# Patient Record
Sex: Female | Born: 1950
Health system: Southern US, Community
[De-identification: ages and names within clinical notes are randomized; demographics above are authoritative.]

## PROBLEM LIST (undated history)

## (undated) DIAGNOSIS — Z5189 Encounter for other specified aftercare: Secondary | ICD-10-CM

## (undated) DIAGNOSIS — I10 Essential (primary) hypertension: Secondary | ICD-10-CM

## (undated) DIAGNOSIS — H269 Unspecified cataract: Secondary | ICD-10-CM

## (undated) DIAGNOSIS — E119 Type 2 diabetes mellitus without complications: Secondary | ICD-10-CM

## (undated) DIAGNOSIS — H547 Unspecified visual loss: Secondary | ICD-10-CM

## (undated) DIAGNOSIS — M1990 Unspecified osteoarthritis, unspecified site: Secondary | ICD-10-CM

## (undated) DIAGNOSIS — T7840XA Allergy, unspecified, initial encounter: Secondary | ICD-10-CM

## (undated) DIAGNOSIS — E063 Autoimmune thyroiditis: Secondary | ICD-10-CM

## (undated) DIAGNOSIS — E039 Hypothyroidism, unspecified: Secondary | ICD-10-CM

## (undated) DIAGNOSIS — N186 End stage renal disease: Secondary | ICD-10-CM

## (undated) DIAGNOSIS — E11319 Type 2 diabetes mellitus with unspecified diabetic retinopathy without macular edema: Secondary | ICD-10-CM

## (undated) DIAGNOSIS — E785 Hyperlipidemia, unspecified: Secondary | ICD-10-CM

## (undated) HISTORY — DX: Allergy, unspecified, initial encounter: T78.40XA

## (undated) HISTORY — DX: Encounter for other specified aftercare: Z51.89

## (undated) HISTORY — DX: Essential (primary) hypertension: I10

## (undated) HISTORY — DX: Type 2 diabetes mellitus without complications: E11.9

## (undated) HISTORY — PX: EYE SURGERY: SHX253

## (undated) HISTORY — DX: Type 2 diabetes mellitus with unspecified diabetic retinopathy without macular edema: E11.319

## (undated) HISTORY — DX: Unspecified visual loss: H54.7

## (undated) HISTORY — DX: End stage renal disease: N18.6

## (undated) HISTORY — PX: ABDOMINAL HYSTERECTOMY: SHX81

## (undated) HISTORY — DX: Autoimmune thyroiditis: E06.3

## (undated) HISTORY — PX: KNEE SURGERY: SHX244

## (undated) HISTORY — DX: Hypothyroidism, unspecified: E03.9

## (undated) HISTORY — DX: Unspecified cataract: H26.9

## (undated) HISTORY — PX: COLONOSCOPY: SHX174

## (undated) HISTORY — DX: Hyperlipidemia, unspecified: E78.5

---

## 1997-11-17 ENCOUNTER — Ambulatory Visit (HOSPITAL_COMMUNITY): Admission: RE | Admit: 1997-11-17 | Discharge: 1997-11-17 | Payer: Self-pay | Admitting: *Deleted

## 1998-06-22 ENCOUNTER — Other Ambulatory Visit: Admission: RE | Admit: 1998-06-22 | Discharge: 1998-06-22 | Payer: Self-pay | Admitting: *Deleted

## 1999-11-24 ENCOUNTER — Ambulatory Visit (HOSPITAL_COMMUNITY): Admission: RE | Admit: 1999-11-24 | Discharge: 1999-11-24 | Payer: Self-pay | Admitting: *Deleted

## 2000-06-12 ENCOUNTER — Encounter: Admission: RE | Admit: 2000-06-12 | Discharge: 2000-09-10 | Payer: Self-pay | Admitting: Endocrinology

## 2003-09-16 ENCOUNTER — Encounter: Admission: RE | Admit: 2003-09-16 | Discharge: 2003-09-16 | Payer: Self-pay | Admitting: Endocrinology

## 2004-04-21 ENCOUNTER — Ambulatory Visit: Payer: Self-pay | Admitting: Endocrinology

## 2004-04-27 ENCOUNTER — Ambulatory Visit: Payer: Self-pay | Admitting: Endocrinology

## 2004-05-11 ENCOUNTER — Ambulatory Visit: Payer: Self-pay | Admitting: Internal Medicine

## 2004-05-27 ENCOUNTER — Ambulatory Visit: Payer: Self-pay | Admitting: Endocrinology

## 2004-07-04 ENCOUNTER — Ambulatory Visit: Payer: Self-pay | Admitting: Endocrinology

## 2004-07-18 ENCOUNTER — Encounter: Admission: RE | Admit: 2004-07-18 | Discharge: 2004-07-18 | Payer: Self-pay | Admitting: Endocrinology

## 2004-08-30 ENCOUNTER — Ambulatory Visit: Payer: Self-pay | Admitting: Endocrinology

## 2004-09-08 ENCOUNTER — Ambulatory Visit: Payer: Self-pay | Admitting: Endocrinology

## 2004-09-13 ENCOUNTER — Ambulatory Visit (HOSPITAL_COMMUNITY): Admission: RE | Admit: 2004-09-13 | Discharge: 2004-09-13 | Payer: Self-pay | Admitting: Endocrinology

## 2004-09-15 ENCOUNTER — Ambulatory Visit: Payer: Self-pay

## 2004-09-22 ENCOUNTER — Ambulatory Visit: Payer: Self-pay | Admitting: Endocrinology

## 2004-10-04 ENCOUNTER — Ambulatory Visit: Payer: Self-pay | Admitting: Endocrinology

## 2004-10-18 ENCOUNTER — Ambulatory Visit: Payer: Self-pay | Admitting: Endocrinology

## 2004-11-14 ENCOUNTER — Ambulatory Visit: Payer: Self-pay | Admitting: Endocrinology

## 2005-01-09 ENCOUNTER — Ambulatory Visit: Payer: Self-pay | Admitting: Endocrinology

## 2005-05-02 ENCOUNTER — Ambulatory Visit: Payer: Self-pay | Admitting: Endocrinology

## 2005-05-23 ENCOUNTER — Ambulatory Visit: Payer: Self-pay | Admitting: Endocrinology

## 2005-10-19 ENCOUNTER — Ambulatory Visit: Payer: Self-pay | Admitting: Endocrinology

## 2005-11-24 ENCOUNTER — Ambulatory Visit: Payer: Self-pay | Admitting: Endocrinology

## 2005-11-29 ENCOUNTER — Ambulatory Visit: Payer: Self-pay | Admitting: Endocrinology

## 2005-12-13 ENCOUNTER — Ambulatory Visit: Payer: Self-pay | Admitting: Internal Medicine

## 2006-01-30 ENCOUNTER — Ambulatory Visit: Payer: Self-pay | Admitting: Gastroenterology

## 2006-02-20 ENCOUNTER — Ambulatory Visit: Payer: Self-pay | Admitting: Endocrinology

## 2006-03-09 ENCOUNTER — Ambulatory Visit: Payer: Self-pay | Admitting: Gastroenterology

## 2006-03-26 ENCOUNTER — Ambulatory Visit: Payer: Self-pay | Admitting: Endocrinology

## 2006-03-27 ENCOUNTER — Ambulatory Visit (HOSPITAL_BASED_OUTPATIENT_CLINIC_OR_DEPARTMENT_OTHER): Admission: RE | Admit: 2006-03-27 | Discharge: 2006-03-27 | Payer: Self-pay | Admitting: Orthopedic Surgery

## 2006-08-22 ENCOUNTER — Encounter: Admission: RE | Admit: 2006-08-22 | Discharge: 2006-08-22 | Payer: Self-pay | Admitting: Endocrinology

## 2006-11-22 ENCOUNTER — Encounter: Payer: Self-pay | Admitting: Endocrinology

## 2006-11-22 DIAGNOSIS — E039 Hypothyroidism, unspecified: Secondary | ICD-10-CM | POA: Insufficient documentation

## 2006-11-22 DIAGNOSIS — J309 Allergic rhinitis, unspecified: Secondary | ICD-10-CM | POA: Insufficient documentation

## 2006-11-22 DIAGNOSIS — I10 Essential (primary) hypertension: Secondary | ICD-10-CM | POA: Insufficient documentation

## 2007-03-14 ENCOUNTER — Telehealth: Payer: Self-pay | Admitting: Endocrinology

## 2007-04-14 ENCOUNTER — Emergency Department (HOSPITAL_COMMUNITY): Admission: EM | Admit: 2007-04-14 | Discharge: 2007-04-14 | Payer: Self-pay | Admitting: Emergency Medicine

## 2007-04-23 ENCOUNTER — Ambulatory Visit: Payer: Self-pay | Admitting: Endocrinology

## 2007-05-14 ENCOUNTER — Ambulatory Visit: Payer: Self-pay | Admitting: Endocrinology

## 2007-06-25 ENCOUNTER — Ambulatory Visit: Payer: Self-pay | Admitting: Endocrinology

## 2007-06-26 ENCOUNTER — Telehealth (INDEPENDENT_AMBULATORY_CARE_PROVIDER_SITE_OTHER): Payer: Self-pay | Admitting: *Deleted

## 2007-06-26 LAB — CONVERTED CEMR LAB
Albumin: 3.2 g/dL — ABNORMAL LOW (ref 3.5–5.2)
Amylase: 29 units/L (ref 27–131)
BUN: 18 mg/dL (ref 6–23)
Basophils Absolute: 0 10*3/uL (ref 0.0–0.1)
Creatinine, Ser: 1 mg/dL (ref 0.4–1.2)
Eosinophils Absolute: 0 10*3/uL (ref 0.0–0.6)
GFR calc Af Amer: 74 mL/min
GFR calc non Af Amer: 61 mL/min
HCT: 35.5 % — ABNORMAL LOW (ref 36.0–46.0)
Hemoglobin: 12 g/dL (ref 12.0–15.0)
Lymphocytes Relative: 10.7 % — ABNORMAL LOW (ref 12.0–46.0)
MCHC: 33.9 g/dL (ref 30.0–36.0)
MCV: 88.3 fL (ref 78.0–100.0)
Monocytes Absolute: 0.8 10*3/uL — ABNORMAL HIGH (ref 0.2–0.7)
Monocytes Relative: 5.8 % (ref 3.0–11.0)
Neutro Abs: 11.8 10*3/uL — ABNORMAL HIGH (ref 1.4–7.7)
Neutrophils Relative %: 83.1 % — ABNORMAL HIGH (ref 43.0–77.0)
Potassium: 4.2 meq/L (ref 3.5–5.1)
Sodium: 140 meq/L (ref 135–145)
Total Bilirubin: 0.7 mg/dL (ref 0.3–1.2)

## 2007-08-30 ENCOUNTER — Encounter: Admission: RE | Admit: 2007-08-30 | Discharge: 2007-08-30 | Payer: Self-pay | Admitting: Family Medicine

## 2010-05-22 ENCOUNTER — Encounter: Payer: Self-pay | Admitting: Endocrinology

## 2010-07-14 DIAGNOSIS — H269 Unspecified cataract: Secondary | ICD-10-CM | POA: Insufficient documentation

## 2010-09-16 NOTE — Assessment & Plan Note (Signed)
[place] OFFICE NOTE   NATALLY, HELLENBRAND                         MRN:          [id number]  DATE:01/30/2006                            DOB:          Mar 23, 1951    REASON FOR CONSULTATION:  Colorectal cancer screening.   Ms. Denise Bush is a 60 year old African-American female referred through the  courtesy of Dr. Loanne Drilling for colonoscopy. Except for occasional constipation  she has no GI complaints. Specifically, she is without change in bowel  habits, abdominal pain, melena or hematochezia.   PAST MEDICAL HISTORY:  1. Pertinent for hypertension.  2. Diabetes.  3. She has a history of thyroid disease.  4. She is status post hysterectomy.   FAMILY HISTORY:  Family history is pertinent for a sister with breast cancer  and a sister and mother with diabetes.   MEDICATIONS:  Insulin, Synthroid and Hyzaar.   ALLERGIES:  GLUCOPHAGE, DIAZIDE, NORMODYNE.   SOCIAL HISTORY:  She neither smokes nor drinks. 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   REVIEW OF SYSTEMS:  Positive for joint pains and night sweats.   PHYSICAL EXAMINATION:  VITAL SIGNS:  Pulse 70, blood pressure 144/62. Weight  194.  HEENT: EOMI. PERRLA. Sclerae are anicteric.  Conjunctivae are pink.  NECK:  Supple without thyromegaly, adenopathy or carotid bruits.  CHEST:  Clear to auscultation and percussion without adventitious sounds.  CARDIAC:  Regular rhythm; normal S1 S2.  There are no murmurs, gallops or  rubs.  ABDOMEN:  Bowel sounds are normoactive.  Abdomen is soft, non-tender and non-  distended.  There are no abdominal masses, tenderness, splenic enlargement  or hepatomegaly.  EXTREMITIES:  Full range of motion.  No cyanosis, clubbing or edema.  RECTAL:  Deferred.   ASSESSMENT AND PLAN:  The patient's medical problems are stable including  diabetes and hypertension.   PLAN:  Colonoscopy.       Sandy Salaam. Deatra Ina, MD,FACG      RDK/MedQ  DD:  01/30/2006  DT:  01/31/2006  Job #:  [id number]   cc:   Hilliard Clark A. Loanne Drilling, MD

## 2010-09-16 NOTE — Op Note (Signed)
Denise Bush, Denise Bush                ACCOUNT NO.:  [id number]   MEDICAL RECORD NO.:  [id number]          PATIENT TYPE:  AMB   LOCATION:  [facility]                         FACILITY:  [facility]   PHYSICIAN:  Kipp Brood. Gioffre, M.D.DATE OF BIRTH:  1950-09-04   DATE OF PROCEDURE:  03/27/2006  DATE OF DISCHARGE:                                 OPERATIVE REPORT   SURGEON:  Kipp Brood. Gladstone Lighter, M.D.   ASSISTANT:  Nurse.   PREOPERATIVE DIAGNOSIS:  1. Degenerative arthritis, right knee.  2. Complete tear of the lateral meniscus, right knee.   POSTOPERATIVE DIAGNOSIS:  1. Degenerative arthritis, right knee.  2. Complete tear of the lateral meniscus, right knee.   OPERATION:  1. Diagnostic arthroscopy, right knee.  2. Synovectomy suprapatellar pouch, right knee.  3. Synovectomy medial joint space, right knee.  4. Abrasion chondroplasty medial femoral condyle, right knee.  5. Lateral meniscectomy, right knee.   PROCEDURE:  Under general anesthesia, routine orthopedic prep and draping of  the right lower extremity was carried out.  The patient had 1 gram of IV  Ancef.  A small punctate incision was made in the suprapatellar pouch,  inflow cannula was inserted, and the knee was distended with saline.  Another small punctate incision was made in the anterolateral joint, the  arthroscope was entered from the lateral approach and a complete diagnostic  arthroscopy was carried out.  She had rather extensive synovitis in the  suprapatellar pouch. I introduced a shaver suction device from the medial  approach and did a synovectomy of the suprapatellar pouch.  She had minimal  chondromalacia of her patella.  The ACL was intact.  In the medial joint,  she had a large carved out of the medial femoral condyle.  I did an abrasion  chondroplasty of this area.  The medial meniscus was probed and it was  intact.  She had marked synovitis of the medial joint and I did a  synovectomy.  The cruciates were intact.   The lateral joint was her main  issue, she had severe degenerative arthritic changes in the lateral joint  with a severe tear of the lateral meniscus.  I did a lateral meniscectomy  with the shaver suction device and also did a synovectomy in the area and  cleaned the knee out.  The wound was thoroughly irrigated and the fluid was  removed. All three punctate incisions were closed with 3-0 nylon suture.  I  injected 20 mL of 0.5% Marcaine with epinephrine into the knee joint.  Sterile Neosporin and dressing was applied.  The patient left the operating  room in satisfactory position.   1. Postop, she will be on aspirin twice a day today and twice a day for      two weeks as a blood thinner.  2. She will be on Percocet 10/650 for pain.  3. She is on crutches, partial to full weight-bearing as tolerated.  4. She will be seen in the office in 10-12 days or prior to if she has      problems.  ______________________________  Kipp Brood Gladstone Lighter, M.D.     RAG/MEDQ  D:  03/27/2006  T:  03/27/2006  Job:  [id number]

## 2010-10-24 ENCOUNTER — Ambulatory Visit (HOSPITAL_COMMUNITY)
Admission: RE | Admit: 2010-10-24 | Discharge: 2010-10-24 | Disposition: A | Discharge status: Home / Self Care | Source: Ambulatory Visit | Attending: Ophthalmology | Admitting: Ophthalmology

## 2010-10-24 ENCOUNTER — Ambulatory Visit (HOSPITAL_COMMUNITY)

## 2010-10-24 DIAGNOSIS — Z01818 Encounter for other preprocedural examination: Secondary | ICD-10-CM | POA: Insufficient documentation

## 2010-10-24 DIAGNOSIS — Z01812 Encounter for preprocedural laboratory examination: Secondary | ICD-10-CM | POA: Insufficient documentation

## 2010-10-24 DIAGNOSIS — E11359 Type 2 diabetes mellitus with proliferative diabetic retinopathy without macular edema: Secondary | ICD-10-CM | POA: Insufficient documentation

## 2010-10-24 DIAGNOSIS — H3340 Traction detachment of retina, unspecified eye: Secondary | ICD-10-CM | POA: Insufficient documentation

## 2010-10-24 DIAGNOSIS — I10 Essential (primary) hypertension: Secondary | ICD-10-CM | POA: Insufficient documentation

## 2010-10-24 DIAGNOSIS — E1139 Type 2 diabetes mellitus with other diabetic ophthalmic complication: Secondary | ICD-10-CM | POA: Insufficient documentation

## 2010-10-24 LAB — BASIC METABOLIC PANEL
Chloride: 105 mEq/L (ref 96–112)
GFR calc Af Amer: 60 mL/min — ABNORMAL LOW (ref >60)
GFR calc non Af Amer: 49 mL/min — ABNORMAL LOW (ref >60)
Glucose, Bld: 202 mg/dL — ABNORMAL HIGH (ref 70–99)
Potassium: 4.6 mEq/L (ref 3.5–5.1)
Sodium: 141 mEq/L (ref 135–145)

## 2010-10-24 LAB — CBC
Hemoglobin: 11.4 g/dL — ABNORMAL LOW (ref 12.0–15.0)
MCHC: 33.4 g/dL (ref 30.0–36.0)
RDW: 15.4 % (ref 11.5–15.5)
WBC: 8.1 10*3/uL (ref 4.0–10.5)

## 2010-10-24 LAB — GLUCOSE, CAPILLARY
Glucose-Capillary: 186 mg/dL — ABNORMAL HIGH (ref 70–99)
Glucose-Capillary: 137 mg/dL — ABNORMAL HIGH (ref 70–99)

## 2010-10-24 LAB — SURGICAL PCR SCREEN: Staphylococcus aureus: NEGATIVE

## 2010-12-07 NOTE — Op Note (Signed)
NAMEZIZA, SALZMANN                ACCOUNT NO.:  [id number]  MEDICAL RECORD NO.:  [id number]  LOCATION:  [facility]                         FACILITY:  [place]  PHYSICIAN:  Dominica Severin A. Halley Kincer, M.D.   DATE OF BIRTH:  1951/03/03  DATE OF PROCEDURE:  10/24/2010 DATE OF DISCHARGE:  10/24/2010                              OPERATIVE REPORT   PREOPERATIVE DIAGNOSES: 1. 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 2. Proliferative vitreoretinopathy, left eye.  POSTOPERATIVE DIAGNOSES: 1. 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 2. Proliferative vitreoretinopathy, left eye.  PROCEDURE: 1. Posterior vitrectomy with membrane peel - repair of complex retinal     detachment via endolaser photocoagulation, removal of retained     silicone oil implant - nonmagnetic foreign body, left eye. 2. Injection of vitreous substitute - silicone oil [id number] centistokes.  SURGEON:  Clent Demark. Andriana Casa, MD  ANESTHESIA:  General endotracheal anesthesia.  INDICATION FOR PROCEDURE:  The patient is a 60 year old woman who has profound vision loss in the left eye on the basis of combined traction rhegmatogenous detachment, left eye, proliferative vitreoretinopathy, proliferative diabetic retinopathy, funnel open, funnel detachment, stage D1 with what might be subretinal migration of silicone oil.  The patient understands this is an attempt to reattach the retina to remove the epiretinal tissues contributing to the cicatricial changes into to reestablish and potentially regain some ambulatory vision.  The patient understands the risks of anesthesia  including rate occurrence of death, loss of the eye including, but not limited to hemorrhage, infection, scarring, need for another surgery, no change in vision, loss of vision, and progressive disease despite intervention.  PROCEDURE IN DETAIL:  After appropriate signed consent was obtained, the patient was taken to the operating room.  In the operating room, appropriate monitors followed by mild sedation.  Appropriate site selection was confirmed,  general endotracheal anesthesia was induced without difficulty.  The left periocular region was prepped and draped in usual ophthalmic fashion.  Lid speculum was applied.  A 25-gauge trocar placed in the inferotemporal quadrant.  Infusion was secured and turned on in the vitreous cavity.  Superior trocar applied superotemporally.  Cutdown conjunctival previous superonasal quadrant with an MVR blade placed into the vitreous cavity to allow for oil removal.  A 25-gauge instruments were used with an 18-gauge Silastic cannula to extract so as the silicone oil from the vitreous cavity is being confirmatory on subretinal migration of the large inferonasal retinotomy and the oil was removed from this area also with a larger 18- gauge Silastic cannula.  Fluid exchange completed and further migration of oil erupted from beneath the retina.  Significant contracture was noted.  At this time under, an air-fluid exchange completed under fluid, and dense epiretinal proliferative vitreoretinopathy and membranes removed off the optic nerve and  macular region as well as peripherally. This did not mobilize retina somewhat.  There is need for retinotomy superior 2 or 3 o'clock hours for some foreshortening of the retina. This was carried out without difficulty.  Fluid-air exchange completed, and all oil had been removed from the subretinal space.  Under air, Endolaser photocoagulation carried on panphotocoagulation basis.  Retina flattened nicely with  posterior pole.  Periphery from the 1 o'clock position down around to the 7 o'clock position were attached. Retinotomy was necessary to extend from the 7:30 position to the 11:30 position.  The retina flattened nicely.  The instruments were removed from the eye, and air-silicone oil exchange carried out passively under direct observation.  Supratemporal sclerotomy closed with Vicryl suture. Similarly, superonasal was closed with adequate oil fill was obtained. Then the infusion was then removed and closed with 7-0 Vicryl suture. Subconjunctival steroid applied.  Sterile patch and Fox shield applied. The patient was awaken from anesthesia and taken to the PACU in good stable condition.     Clent Demark Dorthy Magnussen, M.D.     GAR/MEDQ  D:  10/24/2010  T:  10/25/2010  Job:  [id number]  Electronically Signed by Deloria Lair M.D. on 12/07/2010 02:46:58 PM

## 2012-05-06 DIAGNOSIS — E113599 Type 2 diabetes mellitus with proliferative diabetic retinopathy without macular edema, unspecified eye: Secondary | ICD-10-CM | POA: Insufficient documentation

## 2013-01-29 HISTORY — PX: EYE SURGERY: SHX253

## 2013-02-18 ENCOUNTER — Ambulatory Visit (INDEPENDENT_AMBULATORY_CARE_PROVIDER_SITE_OTHER)

## 2013-02-18 VITALS — BP 168/78 | HR 64 | Temp 96.7°F | Resp 12 | Ht 65.0 in | Wt 180.0 lb

## 2013-02-18 DIAGNOSIS — E1040 Type 1 diabetes mellitus with diabetic neuropathy, unspecified: Secondary | ICD-10-CM

## 2013-02-18 DIAGNOSIS — M79609 Pain in unspecified limb: Secondary | ICD-10-CM

## 2013-02-18 DIAGNOSIS — E1049 Type 1 diabetes mellitus with other diabetic neurological complication: Secondary | ICD-10-CM

## 2013-02-18 DIAGNOSIS — E1142 Type 2 diabetes mellitus with diabetic polyneuropathy: Secondary | ICD-10-CM

## 2013-02-18 DIAGNOSIS — B351 Tinea unguium: Secondary | ICD-10-CM

## 2013-02-18 NOTE — Patient Instructions (Signed)

## 2013-02-18 NOTE — Progress Notes (Signed)
  Subjective:    Patient ID: Denise Bush, female    DOB: 1950/11/09, 62 y.o.   MRN: [id number]  HPI Comments: '' TRIM MY TOENAILS''  patient has long-standing history of thick discolored and darkened brittle nails 1 through 5 bilateral. They're tender on palpation and debridement. Patient also some dry skin. Does have a long-standing history of diabetes which is well controlled this time.    Review of Systems  Constitutional: Negative.   HENT: Negative.   Eyes: Positive for visual disturbance.  Respiratory: Negative.   Cardiovascular: Negative.   Gastrointestinal: Negative.   Endocrine: Negative.   Genitourinary: Positive for frequency.  Musculoskeletal: Negative.   Skin: Negative.   Allergic/Immunologic: Negative.   Neurological: Positive for numbness.  Hematological: Negative.   Psychiatric/Behavioral: Negative.        Objective:   Physical Exam  Constitutional: She is oriented to person, place, and time. She appears well-developed and well-nourished.  Cardiovascular:  Pulses:      Dorsalis pedis pulses are 2+ on the right side, and 2+ on the left side.       Posterior tibial pulses are 1+ on the right side, and 1+ on the left side.  Capillary refill timed 3-4 seconds all digits. Skin temperature warm. No edema. No varicosities to  Musculoskeletal:  Rectus foot type bilateral mild flexible digital contractures noted.  Neurological: She is alert and oriented to person, place, and time. She has normal strength and normal reflexes.  Epicritic and proprioceptive sensations are intact although diminished on Semmes Weinstein testing to the plantar forefoot and inferior heel bilateral sensation to toes ankles and dorsal foot intact by 1 DTRs intact and symmetric normal plantar response  Skin: Skin is warm. No cyanosis. Nails show no clubbing.  Skin color and pigment normal hair growth absent bilateral. Nails criptotic with discoloration and brittleness and tenderness on palpation.  There is distal pterygium noted on multiple nails in particular fourth left  Psychiatric: She has a normal mood and affect. Her behavior is normal.          Assessment & Plan:  Diabetes with peripheral neuropathy. Onychomycosis nails with brittleness discoloration and incurvation tender painful nails debrided x10 the presence of diabetes cutting factors and onychomycosis return for followup in 3 months or as needed in the future.  Denise Bush DPM

## 2013-03-11 ENCOUNTER — Ambulatory Visit: Payer: Self-pay

## 2013-05-01 HISTORY — PX: BREAST BIOPSY: SHX20

## 2013-05-20 ENCOUNTER — Ambulatory Visit

## 2013-07-15 ENCOUNTER — Other Ambulatory Visit: Payer: Self-pay | Admitting: *Deleted

## 2013-07-15 DIAGNOSIS — N6310 Unspecified lump in the right breast, unspecified quadrant: Secondary | ICD-10-CM

## 2013-07-25 ENCOUNTER — Other Ambulatory Visit

## 2013-07-25 ENCOUNTER — Encounter

## 2013-08-26 ENCOUNTER — Ambulatory Visit (INDEPENDENT_AMBULATORY_CARE_PROVIDER_SITE_OTHER)

## 2013-08-26 VITALS — BP 178/84 | HR 68 | Resp 16 | Ht 65.0 in | Wt 185.0 lb

## 2013-08-26 DIAGNOSIS — E1049 Type 1 diabetes mellitus with other diabetic neurological complication: Secondary | ICD-10-CM

## 2013-08-26 DIAGNOSIS — E1040 Type 1 diabetes mellitus with diabetic neuropathy, unspecified: Secondary | ICD-10-CM

## 2013-08-26 DIAGNOSIS — B351 Tinea unguium: Secondary | ICD-10-CM

## 2013-08-26 DIAGNOSIS — M79609 Pain in unspecified limb: Secondary | ICD-10-CM

## 2013-08-26 DIAGNOSIS — E1142 Type 2 diabetes mellitus with diabetic polyneuropathy: Secondary | ICD-10-CM

## 2013-08-26 NOTE — Progress Notes (Signed)
   Subjective:    Patient ID: Denise Bush, female    DOB: 23-Nov-1950, 63 y.o.   MRN: [id number]  HPI Comments: Pt presents for debridement of elongated, encurvated toenails 1 - 10.     Review of Systems no new systemic changes or findings noted     Objective:   Physical Exam 63 year old African American for options this time for followup diabetic foot and nail care patient does have thick brittle crumbly friable mycotic nails 1 through 5 bilateral painful tender symptomatic with palpation and debridement patient pedal pulses palpable DP +2/4 bilateral PT one over 4 bilateral capillary refill timed 3-4 seconds all digits epicritic and proprioceptive sensations intact and symmetric bilateral there is decreased sensation Semmes Weinstein testing to the forefoot and digits no open wounds ulcerations no secondary infections mild digital contractures are identified.       Assessment & Plan:  Assessment this time his diabetes with history peripheral neuropathy decreased sensation confirmed on Semmes Weinstein testing to forefoot digits nails thick brittle crumbly friable incurvated and mycotic debrided 1 through 5 bilateral the presence of diabetes and complications as well as pain symptomatology Neosporin lumicain Neosporin applied to the fourth left and third right following debridement return for future palliative care in the feet on an as-needed basis  Harriet Masson DPM

## 2013-08-26 NOTE — Patient Instructions (Signed)
Diabetes and Foot Care Diabetes may cause you to have problems because of poor blood supply (circulation) to your feet and legs. This may cause the skin on your feet to become thinner, break easier, and heal more slowly. Your skin may become dry, and the skin may peel and crack. You may also have nerve damage in your legs and feet causing decreased feeling in them. You may not notice minor injuries to your feet that could lead to infections or more serious problems. Taking care of your feet is one of the most important things you can do for yourself.  HOME CARE INSTRUCTIONS  Wear shoes at all times, even in the house. Do not go barefoot. Bare feet are easily injured.  Check your feet daily for blisters, cuts, and redness. If you cannot see the bottom of your feet, use a mirror or ask someone for help.  Wash your feet with warm water (do not use hot water) and mild soap. Then pat your feet and the areas between your toes until they are completely dry. Do not soak your feet as this can dry your skin.  Apply a moisturizing lotion or petroleum jelly (that does not contain alcohol and is unscented) to the skin on your feet and to dry, brittle toenails. Do not apply lotion between your toes.  Trim your toenails straight across. Do not dig under them or around the cuticle. File the edges of your nails with an emery board or nail file.  Do not cut corns or calluses or try to remove them with medicine.  Wear clean socks or stockings every day. Make sure they are not too tight. Do not wear knee-high stockings since they may decrease blood flow to your legs.  Wear shoes that fit properly and have enough cushioning. To break in new shoes, wear them for just a few hours a day. This prevents you from injuring your feet. Always look in your shoes before you put them on to be sure there are no objects inside.  Do not cross your legs. This may decrease the blood flow to your feet.  If you find a minor scrape,  cut, or break in the skin on your feet, keep it and the skin around it clean and dry. These areas may be cleansed with mild soap and water. Do not cleanse the area with peroxide, alcohol, or iodine.  When you remove an adhesive bandage, be sure not to damage the skin around it.  If you have a wound, look at it several times a day to make sure it is healing.  Do not use heating pads or hot water bottles. They may burn your skin. If you have lost feeling in your feet or legs, you may not know it is happening until it is too late.  Make sure your health care provider performs a complete foot exam at least annually or more often if you have foot problems. Report any cuts, sores, or bruises to your health care provider immediately. SEEK MEDICAL CARE IF:   You have an injury that is not healing.  You have cuts or breaks in the skin.  You have an ingrown nail.  You notice redness on your legs or feet.  You feel burning or tingling in your legs or feet.  You have pain or cramps in your legs and feet.  Your legs or feet are numb.  Your feet always feel cold. SEEK IMMEDIATE MEDICAL CARE IF:   There is increasing redness,   swelling, or pain in or around a wound.  There is a red line that goes up your leg.  Pus is coming from a wound.  You develop a fever or as directed by your health care provider.  You notice a bad smell coming from an ulcer or wound. Document Released: 04/14/2000 Document Revised: 12/18/2012 Document Reviewed: 09/24/2012 Exit[employer] Patient Information 2014 [employer].  

## 2013-12-02 ENCOUNTER — Ambulatory Visit

## 2013-12-09 ENCOUNTER — Ambulatory Visit (INDEPENDENT_AMBULATORY_CARE_PROVIDER_SITE_OTHER)

## 2013-12-09 DIAGNOSIS — B351 Tinea unguium: Secondary | ICD-10-CM

## 2013-12-09 DIAGNOSIS — M79606 Pain in leg, unspecified: Secondary | ICD-10-CM

## 2013-12-09 DIAGNOSIS — E1049 Type 1 diabetes mellitus with other diabetic neurological complication: Secondary | ICD-10-CM

## 2013-12-09 DIAGNOSIS — E1040 Type 1 diabetes mellitus with diabetic neuropathy, unspecified: Secondary | ICD-10-CM

## 2013-12-09 DIAGNOSIS — E1142 Type 2 diabetes mellitus with diabetic polyneuropathy: Secondary | ICD-10-CM

## 2013-12-09 DIAGNOSIS — M79609 Pain in unspecified limb: Secondary | ICD-10-CM

## 2013-12-09 NOTE — Progress Notes (Signed)
   Subjective:    Patient ID: Denise Bush, female    DOB: August 21, 1950, 63 y.o.   MRN: [id number]  HPI  Pt presents for nail debridement  Review of Systems no new findings or systemic changes     Objective:   Physical Exam Partially objective findings as follows pedal patient does have history diabetes absent epicritic sensation Semmes Weinstein testing to the fourth to the forefoot and digits. Nails thick brittle crumbly friable 1 through 5 bilateral tender both on palpation with enclosed shoe wear. Pedal pulses palpable DP +2/4 bilateral PT one over 4 bilateral capillary refill time 4 seconds all digits open wounds ulcerations no secondary infections. Hammertoes are noted bilateral mild digital contractures identified       Assessment & Plan:  Assessment diabetes with history peripheral neuropathy decreased sensation confirmed on Semmes Weinstein testing nails thick brittle crumbly friable 1 through 5 bilateral debrided at this time return for future diabetic foot and palliative nail care is needed suggest 3 month followup for nail care  Harriet Masson DPM

## 2013-12-09 NOTE — Patient Instructions (Signed)
Diabetes and Foot Care Diabetes may cause you to have problems because of poor blood supply (circulation) to your feet and legs. This may cause the skin on your feet to become thinner, break easier, and heal more slowly. Your skin may become dry, and the skin may peel and crack. You may also have nerve damage in your legs and feet causing decreased feeling in them. You may not notice minor injuries to your feet that could lead to infections or more serious problems. Taking care of your feet is one of the most important things you can do for yourself.  HOME CARE INSTRUCTIONS  Wear shoes at all times, even in the house. Do not go barefoot. Bare feet are easily injured.  Check your feet daily for blisters, cuts, and redness. If you cannot see the bottom of your feet, use a mirror or ask someone for help.  Wash your feet with warm water (do not use hot water) and mild soap. Then pat your feet and the areas between your toes until they are completely dry. Do not soak your feet as this can dry your skin.  Apply a moisturizing lotion or petroleum jelly (that does not contain alcohol and is unscented) to the skin on your feet and to dry, brittle toenails. Do not apply lotion between your toes.  Trim your toenails straight across. Do not dig under them or around the cuticle. File the edges of your nails with an emery board or nail file.  Do not cut corns or calluses or try to remove them with medicine.  Wear clean socks or stockings every day. Make sure they are not too tight. Do not wear knee-high stockings since they may decrease blood flow to your legs.  Wear shoes that fit properly and have enough cushioning. To break in new shoes, wear them for just a few hours a day. This prevents you from injuring your feet. Always look in your shoes before you put them on to be sure there are no objects inside.  Do not cross your legs. This may decrease the blood flow to your feet.  If you find a minor scrape,  cut, or break in the skin on your feet, keep it and the skin around it clean and dry. These areas may be cleansed with mild soap and water. Do not cleanse the area with peroxide, alcohol, or iodine.  When you remove an adhesive bandage, be sure not to damage the skin around it.  If you have a wound, look at it several times a day to make sure it is healing.  Do not use heating pads or hot water bottles. They may burn your skin. If you have lost feeling in your feet or legs, you may not know it is happening until it is too late.  Make sure your health care provider performs a complete foot exam at least annually or more often if you have foot problems. Report any cuts, sores, or bruises to your health care provider immediately. SEEK MEDICAL CARE IF:   You have an injury that is not healing.  You have cuts or breaks in the skin.  You have an ingrown nail.  You notice redness on your legs or feet.  You feel burning or tingling in your legs or feet.  You have pain or cramps in your legs and feet.  Your legs or feet are numb.  Your feet always feel cold. SEEK IMMEDIATE MEDICAL CARE IF:   There is increasing redness,   swelling, or pain in or around a wound.  There is a red line that goes up your leg.  Pus is coming from a wound.  You develop a fever or as directed by your health care provider.  You notice a bad smell coming from an ulcer or wound. Document Released: 04/14/2000 Document Revised: 12/18/2012 Document Reviewed: 09/24/2012 Exit[employer] Patient Information 2015 [employer]. This information is not intended to replace advice given to you by your health care provider. Make sure you discuss any questions you have with your health care provider.  

## 2013-12-30 HISTORY — PX: EYE SURGERY: SHX253

## 2014-03-17 ENCOUNTER — Ambulatory Visit

## 2014-04-10 ENCOUNTER — Other Ambulatory Visit (HOSPITAL_COMMUNITY): Payer: Self-pay | Admitting: Nephrology

## 2014-04-10 DIAGNOSIS — I3139 Other pericardial effusion (noninflammatory): Secondary | ICD-10-CM

## 2014-04-10 DIAGNOSIS — I313 Pericardial effusion (noninflammatory): Secondary | ICD-10-CM

## 2014-04-15 ENCOUNTER — Ambulatory Visit (HOSPITAL_COMMUNITY)
Admission: RE | Admit: 2014-04-15 | Discharge: 2014-04-15 | Disposition: A | Discharge status: Home / Self Care | Source: Ambulatory Visit | Attending: Endocrinology | Admitting: Endocrinology

## 2014-04-15 DIAGNOSIS — E785 Hyperlipidemia, unspecified: Secondary | ICD-10-CM | POA: Diagnosis not present

## 2014-04-15 DIAGNOSIS — I313 Pericardial effusion (noninflammatory): Secondary | ICD-10-CM | POA: Diagnosis not present

## 2014-04-15 DIAGNOSIS — R0609 Other forms of dyspnea: Secondary | ICD-10-CM

## 2014-04-15 DIAGNOSIS — R0600 Dyspnea, unspecified: Secondary | ICD-10-CM | POA: Diagnosis not present

## 2014-04-15 DIAGNOSIS — I10 Essential (primary) hypertension: Secondary | ICD-10-CM | POA: Diagnosis not present

## 2014-04-15 DIAGNOSIS — R600 Localized edema: Secondary | ICD-10-CM | POA: Diagnosis present

## 2014-04-15 DIAGNOSIS — I340 Nonrheumatic mitral (valve) insufficiency: Secondary | ICD-10-CM | POA: Insufficient documentation

## 2014-04-15 DIAGNOSIS — I319 Disease of pericardium, unspecified: Secondary | ICD-10-CM

## 2014-04-15 DIAGNOSIS — I3139 Other pericardial effusion (noninflammatory): Secondary | ICD-10-CM

## 2014-04-15 NOTE — Progress Notes (Signed)
  Echocardiogram 2D Echocardiogram has been performed.  Darlina Sicilian M 04/15/2014, 3:10 PM

## 2014-06-16 ENCOUNTER — Ambulatory Visit

## 2014-06-23 ENCOUNTER — Ambulatory Visit: Payer: Self-pay | Admitting: Internal Medicine

## 2014-06-24 ENCOUNTER — Encounter: Payer: Self-pay | Admitting: Internal Medicine

## 2014-06-24 ENCOUNTER — Ambulatory Visit (INDEPENDENT_AMBULATORY_CARE_PROVIDER_SITE_OTHER): Admitting: Internal Medicine

## 2014-06-24 VITALS — BP 162/80 | HR 80 | Temp 97.5°F | Resp 16 | Ht 66.0 in | Wt 161.2 lb

## 2014-06-24 DIAGNOSIS — I10 Essential (primary) hypertension: Secondary | ICD-10-CM

## 2014-06-24 DIAGNOSIS — E1029 Type 1 diabetes mellitus with other diabetic kidney complication: Secondary | ICD-10-CM

## 2014-06-24 DIAGNOSIS — E559 Vitamin D deficiency, unspecified: Secondary | ICD-10-CM

## 2014-06-24 DIAGNOSIS — Z79899 Other long term (current) drug therapy: Secondary | ICD-10-CM

## 2014-06-24 DIAGNOSIS — E1169 Type 2 diabetes mellitus with other specified complication: Secondary | ICD-10-CM | POA: Insufficient documentation

## 2014-06-24 DIAGNOSIS — E782 Mixed hyperlipidemia: Secondary | ICD-10-CM

## 2014-06-24 DIAGNOSIS — E039 Hypothyroidism, unspecified: Secondary | ICD-10-CM

## 2014-06-24 LAB — BASIC METABOLIC PANEL WITH GFR
BUN: 46 mg/dL — ABNORMAL HIGH (ref 6–23)
CALCIUM: 9.4 mg/dL (ref 8.4–10.5)
CO2: 23 mEq/L (ref 19–32)
CREATININE: 3.5 mg/dL — AB (ref 0.50–1.10)
Chloride: 109 mEq/L (ref 96–112)
GFR, EST AFRICAN AMERICAN: 15 mL/min — AB
GFR, Est Non African American: 13 mL/min — ABNORMAL LOW
GLUCOSE: 175 mg/dL — AB (ref 70–99)
Potassium: 4.9 mEq/L (ref 3.5–5.3)
SODIUM: 143 meq/L (ref 135–145)

## 2014-06-24 LAB — CBC WITH DIFFERENTIAL/PLATELET
Basophils Absolute: 0.1 10*3/uL (ref 0.0–0.1)
Basophils Relative: 1 % (ref 0–1)
EOS ABS: 0.1 10*3/uL (ref 0.0–0.7)
EOS PCT: 1 % (ref 0–5)
HCT: 33.7 % — ABNORMAL LOW (ref 36.0–46.0)
HEMOGLOBIN: 10.8 g/dL — AB (ref 12.0–15.0)
LYMPHS ABS: 2.4 10*3/uL (ref 0.7–4.0)
Lymphocytes Relative: 34 % (ref 12–46)
MCH: 26.9 pg (ref 26.0–34.0)
MCHC: 32 g/dL (ref 30.0–36.0)
MCV: 83.8 fL (ref 78.0–100.0)
MONO ABS: 0.4 10*3/uL (ref 0.1–1.0)
MONOS PCT: 6 % (ref 3–12)
MPV: 11.4 fL (ref 8.6–12.4)
Neutro Abs: 4.1 10*3/uL (ref 1.7–7.7)
Neutrophils Relative %: 58 % (ref 43–77)
PLATELETS: 264 10*3/uL (ref 150–400)
RBC: 4.02 MIL/uL (ref 3.87–5.11)
RDW: 17.1 % — ABNORMAL HIGH (ref 11.5–15.5)
WBC: 7.1 10*3/uL (ref 4.0–10.5)

## 2014-06-24 NOTE — Patient Instructions (Signed)

## 2014-06-24 NOTE — Progress Notes (Signed)
Patient ID: Denise Bush, female   DOB: 12/03/1950, 64 y.o.   MRN: [id number]   This very nice 64 y.o. [name] presents for New Patient evaluation referred by her spouse who is an established patient here with Hypertension, Hyperlipidemia, Pre-Diabetes and Vitamin D Deficiency. Other poroblems include compensated hypothyroidism.    Patient is treated for HTN since 1998 & BP has been controlled at home. She relates that she had a negative heart scan in Dec 2025.  Today's BP: (!) 162/80 mmHg. Patient has had no complaints of any cardiac type chest pain, palpitations, dyspnea/orthopnea/PND, dizziness, claudication, or dependent edema.   Hyperlipidemia is  with diet & Zetia and she relates a hx/o Statin intolerance. Patient denies myalgias or other med SE's.Last lipid labs are unavailable.   Also, the patient has history of T2_NIDDM -> now T1_DM - predating since 1988 and now she is on insulin and reports she has been advised that she has Stage 4 CKD.  She denies symptoms of reactive hypoglycemia, diabetic polys, paresthesias or visual blurring.  Last A1c was 7.0% in Jan by her endocrinologist- Dr Denise Bush - in [place].  She also relates she is followed by her Nephrologist - Dr Denise Bush.  Medication Sig  . amLODipine-benazepril (LOTREL) 10-20 MG per capsule Take 1 capsule by mouth daily.  Marland Kitchen ezetimibe (ZETIA) 10 MG tablet Take 10 mg by mouth daily.  . furosemide (LASIX) 20 MG tablet Take 20 mg by mouth.  Marland Kitchen glucose blood test strip 1 each by Other route as needed for other. Use as instructed  . insulin glargine (LANTUS) 100 UNIT/ML injection Inject into the skin at bedtime. Taking 10 units daily  . losartan (COZAAR) 100 MG tablet Take 100 mg by mouth daily.  . cloNIDine (CATAPRES) 0.3 MG patch  uses patch weekly  . levothyroxine (SYNTHROID, LEVOTHROID) 175 MCG tablet Take 175 mcg by mouth daily before breakfast.  . nebivolol (BYSTOLIC) 10 MG tablet Take 10 mg by mouth daily.  . saxagliptin HCl  (ONGLYZA) 2.5 MG TABS tablet Take by mouth daily.   Allergies  Allergen Reactions  . Azor [Amlodipine-Olmesartan]   . Hydralazine     fatigue  . Hydrochlorothiazide W-Triamterene   . Labetalol     Per patient, caused foot pain  . Lisinopril Other (See Comments)    Severe radiating foot pain.  . Metformin   . Minoxidil     fatigue  . Other     Diazide pt states cause throat to swell.  Marland Kitchen Spironolactone Other (See Comments)    Severe radiating pain in feet.  . Statins     Cause fatigue   PMHx:   Immunization History  Administered Date(s) Administered  . Influenza Whole 04/23/2007   FHx:    Reviewed / unchanged  SHx:    Reviewed / unchanged  Systems Review:  Constitutional: Denies fever, chills, wt changes, headaches, insomnia, fatigue, night sweats, change in appetite. Eyes: Denies redness, blurred vision, diplopia, discharge, itchy, watery eyes.  ENT: Denies discharge, congestion, post nasal drip, epistaxis, sore throat, earache, hearing loss, dental pain, tinnitus, vertigo, sinus pain, snoring.  CV: Denies chest pain, palpitations, irregular heartbeat, syncope, dyspnea, diaphoresis, orthopnea, PND, claudication or edema. Respiratory: denies cough, dyspnea, DOE, pleurisy, hoarseness, laryngitis, wheezing.  Gastrointestinal: Denies dysphagia, odynophagia, heartburn, reflux, water brash, abdominal pain or cramps, nausea, vomiting, bloating, diarrhea, constipation, hematemesis, melena, hematochezia  or hemorrhoids. Genitourinary: Denies dysuria, frequency, urgency, nocturia, hesitancy, discharge, hematuria or flank pain. Musculoskeletal: Denies arthralgias, myalgias, stiffness, jt.  swelling, pain, limping or strain/sprain.  Skin: Denies pruritus, rash, hives, warts, acne, eczema or change in skin lesion(s). Neuro: No weakness, tremor, incoordination, spasms, paresthesia or pain. Psychiatric: Denies confusion, memory loss or sensory loss. Endo: Denies change in weight, skin or  hair change.  Heme/Lymph: No excessive bleeding, bruising or enlarged lymph nodes.  Physical Exam  BP 162/80   Pulse 80  Temp 97.5 F   Resp 16  Ht 5\' 6"    Wt 161 lb 3.2 oz     BMI 26.03   Appears well nourished and in no distress. Eyes: PERRLA, EOMs, conjunctiva no swelling or erythema. Sinuses: No frontal/maxillary tenderness ENT/Mouth: EAC's clear, TM's nl w/o erythema, bulging. Nares clear w/o erythema, swelling, exudates. Oropharynx clear without erythema or exudates. Oral hygiene is good. Tongue normal, non obstructing. Hearing intact.  Neck: Supple. Thyroid nl. Car 2+/2+ without bruits, nodes or JVD. Chest: Respirations nl with BS clear & equal w/o rales, rhonchi, wheezing or stridor.  Cor: Heart sounds normal w/ regular rate and rhythm without sig. murmurs, gallops, clicks, or rubs. Peripheral pulses normal and equal  without edema.  Abdomen: Soft & bowel sounds normal. Non-tender w/o guarding, rebound, hernias, masses, or organomegaly.  Lymphatics: Unremarkable.  Musculoskeletal: Full ROM all peripheral extremities, joint stability, 5/5 strength, and normal gait.  Skin: Warm, dry without exposed rashes, lesions or ecchymosis apparent.  Neuro: Cranial nerves intact, reflexes equal bilaterally. Sensory-motor testing grossly intact. Tendon reflexes grossly intact.  Pysch: Alert & oriented x 3.  Insight and judgement nl & appropriate. No ideations.  Assessment and Plan:  1. Essential hypertension   2. Hypothyroidism, unspecified hypothyroidism type   3. Type 1 diabetes mellitus with other diabetic kidney complication   4. Mixed hyperlipidemia   5. Vitamin D deficiency  - Vit D  25 hydroxy (rtn osteoporosis monitoring)  6. Medication managemen  - CBC with Differential/Platelet - BASIC METABOLIC PANEL WITH GFR    Recommended regular exercise, BP monitoring, weight control, and discussed med and SE's. Recommended labs to assess and monitor clinical status. Further  disposition pending results of labs.

## 2014-06-25 LAB — VITAMIN D 25 HYDROXY (VIT D DEFICIENCY, FRACTURES): VIT D 25 HYDROXY: 25 ng/mL — AB (ref 30–100)

## 2014-07-14 ENCOUNTER — Ambulatory Visit (INDEPENDENT_AMBULATORY_CARE_PROVIDER_SITE_OTHER)

## 2014-07-14 DIAGNOSIS — E1040 Type 1 diabetes mellitus with diabetic neuropathy, unspecified: Secondary | ICD-10-CM

## 2014-07-14 DIAGNOSIS — M79606 Pain in leg, unspecified: Secondary | ICD-10-CM | POA: Diagnosis not present

## 2014-07-14 DIAGNOSIS — E1041 Type 1 diabetes mellitus with diabetic mononeuropathy: Secondary | ICD-10-CM | POA: Diagnosis not present

## 2014-07-14 DIAGNOSIS — B351 Tinea unguium: Secondary | ICD-10-CM | POA: Diagnosis not present

## 2014-07-14 NOTE — Progress Notes (Signed)
   Subjective:    Patient ID: Sheral Apley, female    DOB: 03-Oct-1950, 64 y.o.   MRN: [id number]  HPI  Toenails trim.  Review of Systems no new findings or systemic changes noted     Objective:   Physical Exam Neurovascular status unchanged pedal pulses are palpable DP +2 PT 1 over 4 Refill time 4 seconds epicritic sensations diminished to the forefoot digits and plantar arch digital contractures are noted no open wounds no ulcers nails thick brittle crumbly friable dystrophic 1 through 5 bilateral been wearing 6 months nails are cut they're extremely overgrown.       Assessment & Plan:  Assessment this time his diabetes with history peripheral neuropathy and angiopathy painful mycotic ingrowing dystrophic nails 1 through 5 bilateral debrided at this time return for future palliative care every 3 months  Harriet Masson DPM

## 2014-09-01 ENCOUNTER — Ambulatory Visit (INDEPENDENT_AMBULATORY_CARE_PROVIDER_SITE_OTHER): Admitting: Internal Medicine

## 2014-09-01 ENCOUNTER — Encounter: Payer: Self-pay | Admitting: Internal Medicine

## 2014-09-01 VITALS — BP 182/78 | HR 80 | Temp 97.4°F | Resp 16 | Ht 66.0 in | Wt 166.2 lb

## 2014-09-01 DIAGNOSIS — I10 Essential (primary) hypertension: Secondary | ICD-10-CM

## 2014-09-01 DIAGNOSIS — E782 Mixed hyperlipidemia: Secondary | ICD-10-CM

## 2014-09-01 DIAGNOSIS — Z79899 Other long term (current) drug therapy: Secondary | ICD-10-CM

## 2014-09-01 DIAGNOSIS — E785 Hyperlipidemia, unspecified: Secondary | ICD-10-CM

## 2014-09-01 DIAGNOSIS — E1029 Type 1 diabetes mellitus with other diabetic kidney complication: Secondary | ICD-10-CM

## 2014-09-01 DIAGNOSIS — E559 Vitamin D deficiency, unspecified: Secondary | ICD-10-CM

## 2014-09-01 DIAGNOSIS — R7309 Other abnormal glucose: Secondary | ICD-10-CM

## 2014-09-01 DIAGNOSIS — E039 Hypothyroidism, unspecified: Secondary | ICD-10-CM

## 2014-09-01 MED ORDER — ATENOLOL 100 MG PO TABS: 100.0000 mg | ORAL_TABLET | Freq: Every day | ORAL | Status: DC

## 2014-09-01 NOTE — Patient Instructions (Signed)

## 2014-09-01 NOTE — Progress Notes (Signed)
Patient ID: Denise Bush, female   DOB: 1951-02-19, 64 y.o.   MRN: [id number]   This very nice 64 y.o. [name] presents for 3 month follow up with Hypertension, Hyperlipidemia, Pre-Diabetes and Vitamin D Deficiency.    Patient is treated for HTN since 1998 & BP has not been controlled at home usually running betw 160-168/80's. Today's BP was elevated at 182/78 and rechecked at 158/84.  Patient has had no complaints of any cardiac type chest pain, palpitations, dyspnea/orthopnea/PND, dizziness, claudication, or dependent edema.   Hyperlipidemia is controlled with diet & Zetia (patient is statin intolerant). Patient denies myalgias or other med SE's. Current Lipids are at goal as below:  Lab Results  Component Value Date   CHOL 170 09/01/2014   HDL 43* 09/01/2014   LDLCALC 72 09/01/2014   TRIG 277* 09/01/2014   CHOLHDL 4.0 09/01/2014    Also, the patient has history of T2_NIDDM since 1998  w/CKD4/5 and has had no symptoms of reactive hypoglycemia, diabetic polys, paresthesias or visual blurring.  Current A1c is 8.5%. Patient is followed by Dr Denise Bush in [place] for her diabetes. Patient has end stage Kidney Disease and has a mature AV shunt in the LA and is followed closely by Dr Denise Bush.   Further, the patient also has history of Vitamin D Deficiency and supplements vitamin D without any suspected side-effects. Last vitamin D was 25 on 06/24/2014.  Medication Sig  . LOTREL 10-20 MG  Take 1 capsule by mouth daily.  . cloNIDine TTS-3 Place 0.3 mg onto the skin once a week.  Marland Kitchen VITAMIN B 12  Take 1,000 mcg by mouth daily.  Marland Kitchen ezetimibe (ZETIA) 10 MG tablet Take 10 mg by mouth daily.  . furosemide  20 MG tablet Take 20 mg by mouth.  Marland Kitchen LANTUS 100 UNIT/ML  Inject into the skin at bedtime. Taking 10 units daily  . levothyroxine 150 MCG  Take 150 mcg by mouth daily before breakfast.  . losartan  100 MG tablet Take 100 mg by mouth daily.  . Iron 65 mg   1 daily  . Vitamin D 50,000 UNITS  Take 50,000  Units by mouth every 7 (seven) days.   Allergies  Allergen Reactions  . Azor [Amlodipine-Olmesartan]   . Hydralazine     fatigue  . Hydrochlorothiazide W-Triamterene   . Labetalol     Per patient, caused foot pain  . Lisinopril Other (See Comments)    Severe radiating foot pain.  . Metformin   . Minoxidil     fatigue  . Other     Diazide pt states cause throat to swell.  Marland Kitchen Spironolactone Other (See Comments)    Severe radiating pain in feet.  . Statins     Cause fatigue   PMHx:   Immunization History  Administered Date(s) Administered  . Influenza Whole 04/23/2007    FHx:    Reviewed / unchanged  SHx:    Reviewed / unchanged  Systems Review:  Constitutional: Denies fever, chills, wt changes, headaches, insomnia, fatigue, night sweats, change in appetite. Eyes: Denies redness, blurred vision, diplopia, discharge, itchy, watery eyes.  ENT: Denies discharge, congestion, post nasal drip, epistaxis, sore throat, earache, hearing loss, dental pain, tinnitus, vertigo, sinus pain, snoring.  CV: Denies chest pain, palpitations, irregular heartbeat, syncope, dyspnea, diaphoresis, orthopnea, PND, claudication or edema. Respiratory: denies cough, dyspnea, DOE, pleurisy, hoarseness, laryngitis, wheezing.  Gastrointestinal: Denies dysphagia, odynophagia, heartburn, reflux, water brash, abdominal pain or cramps, nausea, vomiting, bloating, diarrhea, constipation, hematemesis,  melena, hematochezia  or hemorrhoids. Genitourinary: Denies dysuria, frequency, urgency, nocturia, hesitancy, discharge, hematuria or flank pain. Musculoskeletal: Denies arthralgias, myalgias, stiffness, jt. swelling, pain, limping or strain/sprain.  Skin: Denies pruritus, rash, hives, warts, acne, eczema or change in skin lesion(s). Neuro: No weakness, tremor, incoordination, spasms, paresthesia or pain. Psychiatric: Denies confusion, memory loss or sensory loss. Endo: Denies change in weight, skin or hair change.   Heme/Lymph: No excessive bleeding, bruising or enlarged lymph nodes.  Physical Exam  BP 182/78   Pulse 80  Temp 97.4 F Resp 16  Ht 5\' 6"    Wt 166 lb 3.2 oz    BMI 26.84    Appears well nourished and in no distress. Eyes: PERRLA, EOMs, conjunctiva no swelling or erythema. Sinuses: No frontal/maxillary tenderness ENT/Mouth: EAC's clear, TM's nl w/o erythema, bulging. Nares clear w/o erythema, swelling, exudates. Oropharynx clear without erythema or exudates. Oral hygiene is good. Tongue normal, non obstructing. Hearing intact.  Neck: Supple. Thyroid nl. Car 2+/2+ without bruits, nodes or JVD. Chest: Respirations nl with BS clear & equal w/o rales, rhonchi, wheezing or stridor.  Cor: Heart sounds normal w/ regular rate and rhythm without sig. murmurs, gallops, clicks, or rubs. Peripheral pulses normal and equal w/ Left radial pulse is decreased. No dependent edema Thrill & bruit over Left arm shunt. Abdomen: Soft & bowel sounds normal. Non-tender w/o guarding, rebound, hernias, masses, or organomegaly.  Lymphatics: Unremarkable.  Musculoskeletal: Full ROM all peripheral extremities, joint stability, 5/5 strength, and normal gait.  Skin: Warm, dry without exposed rashes, lesions or ecchymosis apparent.  Neuro: Cranial nerves intact, reflexes equal bilaterally. Sensory-motor testing grossly intact. Tendon reflexes grossly intact.  Pysch: Alert & oriented x 3.  Insight and judgement nl & appropriate. No ideations.  Assessment and Plan:  1. Essential hypertension  - atenolol (TENORMIN) 100 MG tablet; Take 1 tablet (100 mg total) by mouth daily.  Dispense: 90 tablet; Refill: 99 - TSH  2. Mixed hyperlipidemia  - Lipid panel  3. 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 Vitamin D deficiency  - Vit D  25 hydroxy   5. Hypothyroidism   6. Medication management  - CBC with Differential/Platelet - BASIC METABOLIC PANEL WITH GFR -  Hepatic function panel - Magnesium   Recommended regular exercise, BP monitoring, weight control, and discussed med and SE's. Recommended labs to assess and monitor clinical status. Further disposition pending results of labs. Over 30 minutes of exam, counseling, chart review was performed

## 2014-09-02 ENCOUNTER — Other Ambulatory Visit: Payer: Self-pay | Admitting: Internal Medicine

## 2014-09-02 ENCOUNTER — Other Ambulatory Visit: Payer: Self-pay | Admitting: *Deleted

## 2014-09-02 LAB — BASIC METABOLIC PANEL WITH GFR
BUN: 57 mg/dL — AB (ref 6–23)
CALCIUM: 9.1 mg/dL (ref 8.4–10.5)
CO2: 25 mEq/L (ref 19–32)
Chloride: 108 mEq/L (ref 96–112)
Creat: 3.79 mg/dL — ABNORMAL HIGH (ref 0.50–1.10)
GFR, EST AFRICAN AMERICAN: 14 mL/min — AB
GFR, Est Non African American: 12 mL/min — ABNORMAL LOW
GLUCOSE: 219 mg/dL — AB (ref 70–99)
POTASSIUM: 4.9 meq/L (ref 3.5–5.3)
Sodium: 141 mEq/L (ref 135–145)

## 2014-09-02 LAB — CBC WITH DIFFERENTIAL/PLATELET
BASOS ABS: 0.1 10*3/uL (ref 0.0–0.1)
Basophils Relative: 1 % (ref 0–1)
EOS ABS: 0.1 10*3/uL (ref 0.0–0.7)
EOS PCT: 1 % (ref 0–5)
HCT: 32.4 % — ABNORMAL LOW (ref 36.0–46.0)
HEMOGLOBIN: 10.7 g/dL — AB (ref 12.0–15.0)
LYMPHS ABS: 2.6 10*3/uL (ref 0.7–4.0)
Lymphocytes Relative: 34 % (ref 12–46)
MCH: 27.9 pg (ref 26.0–34.0)
MCHC: 33 g/dL (ref 30.0–36.0)
MCV: 84.4 fL (ref 78.0–100.0)
MPV: 11 fL (ref 8.6–12.4)
Monocytes Absolute: 0.5 10*3/uL (ref 0.1–1.0)
Monocytes Relative: 7 % (ref 3–12)
Neutro Abs: 4.3 10*3/uL (ref 1.7–7.7)
Neutrophils Relative %: 57 % (ref 43–77)
Platelets: 232 10*3/uL (ref 150–400)
RBC: 3.84 MIL/uL — ABNORMAL LOW (ref 3.87–5.11)
RDW: 16.8 % — ABNORMAL HIGH (ref 11.5–15.5)
WBC: 7.5 10*3/uL (ref 4.0–10.5)

## 2014-09-02 LAB — HEPATIC FUNCTION PANEL
ALBUMIN: 4 g/dL (ref 3.5–5.2)
ALT: 8 U/L (ref 0–35)
AST: 11 U/L (ref 0–37)
Alkaline Phosphatase: 81 U/L (ref 39–117)
Bilirubin, Direct: 0.1 mg/dL (ref 0.0–0.3)
Indirect Bilirubin: 0.2 mg/dL (ref 0.2–1.2)
TOTAL PROTEIN: 7.1 g/dL (ref 6.0–8.3)
Total Bilirubin: 0.3 mg/dL (ref 0.2–1.2)

## 2014-09-02 LAB — LIPID PANEL
CHOLESTEROL: 170 mg/dL (ref 0–200)
HDL: 43 mg/dL — ABNORMAL LOW (ref >=46)
LDL CALC: 72 mg/dL (ref 0–99)
Total CHOL/HDL Ratio: 4 Ratio
Triglycerides: 277 mg/dL — ABNORMAL HIGH (ref <150)
VLDL: 55 mg/dL — ABNORMAL HIGH (ref 0–40)

## 2014-09-02 LAB — MAGNESIUM: MAGNESIUM: 2 mg/dL (ref 1.5–2.5)

## 2014-09-02 LAB — HEMOGLOBIN A1C
HEMOGLOBIN A1C: 8.5 % — AB (ref <5.7)
MEAN PLASMA GLUCOSE: 197 mg/dL — AB (ref <117)

## 2014-09-02 LAB — INSULIN, RANDOM: INSULIN: 14.6 u[IU]/mL (ref 2.0–19.6)

## 2014-09-02 LAB — TSH: TSH: 0.473 u[IU]/mL (ref 0.350–4.500)

## 2014-09-02 LAB — VITAMIN D 25 HYDROXY (VIT D DEFICIENCY, FRACTURES): VIT D 25 HYDROXY: 39 ng/mL (ref 30–100)

## 2014-09-02 MED ORDER — VITAMIN D (ERGOCALCIFEROL) 1.25 MG (50000 UNIT) PO CAPS: ORAL_CAPSULE | ORAL | Status: DC

## 2014-09-05 ENCOUNTER — Encounter: Payer: Self-pay | Admitting: Internal Medicine

## 2014-09-05 DIAGNOSIS — M1990 Unspecified osteoarthritis, unspecified site: Secondary | ICD-10-CM | POA: Insufficient documentation

## 2014-10-20 ENCOUNTER — Ambulatory Visit: Admitting: Podiatry

## 2014-12-07 ENCOUNTER — Ambulatory Visit (INDEPENDENT_AMBULATORY_CARE_PROVIDER_SITE_OTHER): Admitting: Physician Assistant

## 2014-12-07 ENCOUNTER — Encounter: Payer: Self-pay | Admitting: Internal Medicine

## 2014-12-07 VITALS — BP 174/82 | HR 88 | Temp 97.7°F | Resp 16 | Ht 66.0 in | Wt 162.4 lb

## 2014-12-07 DIAGNOSIS — L03319 Cellulitis of trunk, unspecified: Secondary | ICD-10-CM | POA: Diagnosis not present

## 2014-12-07 DIAGNOSIS — L02219 Cutaneous abscess of trunk, unspecified: Secondary | ICD-10-CM

## 2014-12-07 MED ORDER — DOXYCYCLINE HYCLATE 100 MG PO TABS: 100.0000 mg | ORAL_TABLET | Freq: Two times a day (BID) | ORAL | Status: DC

## 2014-12-07 NOTE — Progress Notes (Signed)
   Subjective:    Patient ID: Denise Bush, female    DOB: 08-10-1950, 64 y.o.   MRN: [id number]  HPI 64 y.o. WF with HTN, DM with CKD presents with boil on her buttocks x 10 days. Very tender and has gotten larger. No draining.  She denies fever, chills.    Review of Systems  Constitutional: Negative.  Negative for chills and fatigue.  Respiratory: Negative.   Cardiovascular: Negative.   Gastrointestinal: Negative.  Negative for nausea.  Skin: Positive for wound.       Objective:   Physical Exam  Constitutional: She appears well-developed and well-nourished. No distress.  Cardiovascular: Normal rate and regular rhythm.   Pulmonary/Chest: Effort normal and breath sounds normal.  Abdominal: Soft. There is no tenderness.  Skin: Skin is warm and dry.  5x4inch indurated area on right buttocks and 3x3inch indurated area with /yellowwhite eschar measuring 0,5 inches on the center of the large abscess. + warmth, tenderness, No fluctuantance or discharge.       Assessment & Plan:  Abscess  At this time, no fluctuance and I doubt an I&D would be beneficial.  Apply hot compresses frequently to promote drainage. Oral antibiotics -- doxycycline due to kidney function Suggest following up Thursday AM  Future Appointments Date Time Provider [place]  12/14/2014 2:30 PM Starlyn Skeans, PA-C [facility] None  12/30/2014 8:30 AM Gardiner Barefoot, DPM [facility]  03/22/2015 2:30 PM Unk Pinto, MD [facility] None

## 2014-12-07 NOTE — Patient Instructions (Signed)

## 2014-12-09 ENCOUNTER — Ambulatory Visit (INDEPENDENT_AMBULATORY_CARE_PROVIDER_SITE_OTHER): Admitting: Physician Assistant

## 2014-12-09 ENCOUNTER — Encounter: Payer: Self-pay | Admitting: Physician Assistant

## 2014-12-09 VITALS — BP 182/88 | HR 80 | Temp 97.7°F | Resp 18 | Ht 66.0 in | Wt 161.4 lb

## 2014-12-09 DIAGNOSIS — L02219 Cutaneous abscess of trunk, unspecified: Secondary | ICD-10-CM

## 2014-12-09 DIAGNOSIS — L03319 Cellulitis of trunk, unspecified: Secondary | ICD-10-CM

## 2014-12-09 NOTE — Progress Notes (Signed)
    Subjective:    Patient ID: Denise Bush, female    DOB: 27-May-1950, 64 y.o.   MRN: [id number]  HPI 64 y.o. WF with HTN, DM with CKD presents for follow up of boil. She is able to sit down now and states it is feeling better, has been on ABX and doing warm/wet compresses. No draining yet.  She denies fever, chills.    Review of Systems  Constitutional: Negative.  Negative for chills and fatigue.  Respiratory: Negative.   Cardiovascular: Negative.   Gastrointestinal: Negative.  Negative for nausea.  Skin: Positive for wound.       Objective:   Physical Exam  Constitutional: She appears well-developed and well-nourished. No distress.  Cardiovascular: Normal rate and regular rhythm.   Pulmonary/Chest: Effort normal and breath sounds normal.  Abdominal: Soft. There is no tenderness.  Skin: Skin is warm and dry.  5x4inch indurated area on right buttocks and 3x3inch indurated area inferior with yellow/white eschar measuring 0.5 inches on the center of the large abscess. Not as tender as before, decreased erythema, No fluctuantance or discharge.       Assessment & Plan:  Abscess  At this time, no fluctuance and I doubt an I&D would be beneficial.  Apply hot compresses frequently to promote drainage. Oral antibiotics -- doxycycline due to kidney function Suggest follow up 12/14/2014.   Future Appointments Date Time Provider [place]  12/14/2014 2:30 PM Starlyn Skeans, PA-C [facility] None  12/30/2014 8:30 AM Gardiner Barefoot, DPM [facility]  03/22/2015 2:30 PM Unk Pinto, MD [facility] None

## 2014-12-14 ENCOUNTER — Encounter: Payer: Self-pay | Admitting: Internal Medicine

## 2014-12-14 ENCOUNTER — Ambulatory Visit (INDEPENDENT_AMBULATORY_CARE_PROVIDER_SITE_OTHER): Admitting: Internal Medicine

## 2014-12-14 VITALS — BP 176/74 | HR 62 | Temp 98.4°F | Resp 16 | Ht 66.0 in | Wt 165.0 lb

## 2014-12-14 DIAGNOSIS — I10 Essential (primary) hypertension: Secondary | ICD-10-CM | POA: Diagnosis not present

## 2014-12-14 DIAGNOSIS — E782 Mixed hyperlipidemia: Secondary | ICD-10-CM

## 2014-12-14 DIAGNOSIS — E1029 Type 1 diabetes mellitus with other diabetic kidney complication: Secondary | ICD-10-CM | POA: Diagnosis not present

## 2014-12-14 DIAGNOSIS — Z79899 Other long term (current) drug therapy: Secondary | ICD-10-CM | POA: Diagnosis not present

## 2014-12-14 DIAGNOSIS — E559 Vitamin D deficiency, unspecified: Secondary | ICD-10-CM | POA: Diagnosis not present

## 2014-12-14 DIAGNOSIS — E039 Hypothyroidism, unspecified: Secondary | ICD-10-CM

## 2014-12-14 LAB — HEPATIC FUNCTION PANEL
ALBUMIN: 3.5 g/dL — AB (ref 3.6–5.1)
ALK PHOS: 73 U/L (ref 33–130)
ALT: 6 U/L (ref 6–29)
AST: 10 U/L (ref 10–35)
Bilirubin, Direct: 0.1 mg/dL (ref <=0.2)
Indirect Bilirubin: 0.3 mg/dL (ref 0.2–1.2)
TOTAL PROTEIN: 6.9 g/dL (ref 6.1–8.1)
Total Bilirubin: 0.4 mg/dL (ref 0.2–1.2)

## 2014-12-14 LAB — CBC WITH DIFFERENTIAL/PLATELET
Basophils Absolute: 0.1 10*3/uL (ref 0.0–0.1)
Basophils Relative: 1 % (ref 0–1)
Eosinophils Absolute: 0.2 10*3/uL (ref 0.0–0.7)
Eosinophils Relative: 2 % (ref 0–5)
HEMATOCRIT: 31.2 % — AB (ref 36.0–46.0)
Hemoglobin: 10.3 g/dL — ABNORMAL LOW (ref 12.0–15.0)
LYMPHS ABS: 2.3 10*3/uL (ref 0.7–4.0)
LYMPHS PCT: 29 % (ref 12–46)
MCH: 28.9 pg (ref 26.0–34.0)
MCHC: 33 g/dL (ref 30.0–36.0)
MCV: 87.4 fL (ref 78.0–100.0)
MONOS PCT: 6 % (ref 3–12)
MPV: 11.3 fL (ref 8.6–12.4)
Monocytes Absolute: 0.5 10*3/uL (ref 0.1–1.0)
NEUTROS PCT: 62 % (ref 43–77)
Neutro Abs: 5 10*3/uL (ref 1.7–7.7)
Platelets: 269 10*3/uL (ref 150–400)
RBC: 3.57 MIL/uL — ABNORMAL LOW (ref 3.87–5.11)
RDW: 15.8 % — ABNORMAL HIGH (ref 11.5–15.5)
WBC: 8.1 10*3/uL (ref 4.0–10.5)

## 2014-12-14 LAB — BASIC METABOLIC PANEL WITH GFR
BUN: 57 mg/dL — AB (ref 7–25)
CHLORIDE: 111 mmol/L — AB (ref 98–110)
CO2: 23 mmol/L (ref 20–31)
CREATININE: 4.49 mg/dL — AB (ref 0.50–0.99)
Calcium: 9 mg/dL (ref 8.6–10.4)
GFR, Est African American: 11 mL/min — ABNORMAL LOW (ref >=60)
GFR, Est Non African American: 10 mL/min — ABNORMAL LOW (ref >=60)
GLUCOSE: 176 mg/dL — AB (ref 65–99)
POTASSIUM: 4.8 mmol/L (ref 3.5–5.3)
Sodium: 145 mmol/L (ref 135–146)

## 2014-12-14 LAB — LIPID PANEL
Cholesterol: 154 mg/dL (ref 125–200)
HDL: 45 mg/dL — ABNORMAL LOW (ref >=46)
LDL CALC: 75 mg/dL (ref <130)
Total CHOL/HDL Ratio: 3.4 Ratio (ref <=5.0)
Triglycerides: 171 mg/dL — ABNORMAL HIGH (ref <150)
VLDL: 34 mg/dL — ABNORMAL HIGH (ref <30)

## 2014-12-14 LAB — MAGNESIUM: MAGNESIUM: 1.8 mg/dL (ref 1.5–2.5)

## 2014-12-14 LAB — HEMOGLOBIN A1C
HEMOGLOBIN A1C: 9.1 % — AB (ref <5.7)
MEAN PLASMA GLUCOSE: 214 mg/dL — AB (ref <117)

## 2014-12-14 LAB — TSH: TSH: 1.158 u[IU]/mL (ref 0.350–4.500)

## 2014-12-14 MED ORDER — METOCLOPRAMIDE HCL 10 MG PO TABS: 10.0000 mg | ORAL_TABLET | Freq: Three times a day (TID) | ORAL | Status: DC

## 2014-12-14 MED ORDER — FUROSEMIDE 40 MG PO TABS: 40.0000 mg | ORAL_TABLET | Freq: Every day | ORAL | Status: DC

## 2014-12-14 MED ORDER — POTASSIUM CHLORIDE CRYS ER 20 MEQ PO TBCR: 20.0000 meq | EXTENDED_RELEASE_TABLET | Freq: Every day | ORAL | Status: DC

## 2014-12-14 NOTE — Patient Instructions (Signed)
Please start taking 40 mg of lasix or furosemide daily.   Continue to take the atenolol.  You can check blood pressures at home daily.  Call the office if it is consistently over 160/90  You can also start taking reglan with meals to see if this will help with appetite and also with the vomiting after meals.  If this medication makes you jittery you can take it with a 1/2 tablet of 25 mg benadryl to help.

## 2014-12-14 NOTE — Progress Notes (Signed)
Patient ID: Denise Bush, female   DOB: 1951/01/19, 64 y.o.   MRN: [id number]  Assessment and Plan:  Hypertension:  -increase lasix to 40 mg daily -continue to check BP at home, call office if 160/90 consistently -Continue medication -monitor blood pressure at home. -Continue DASH diet -Reminder to go to the ER if any CP, SOB, nausea, dizziness, severe HA, changes vision/speech, left arm numbness and tingling and jaw pain.  Cholesterol - Continue diet and exercise -Check cholesterol.   Diabetes with diabetic chronic kidney disease -Continue diet and exercise.  -Check A1C  Vitamin D Def -check level -continue medications.   Poor appetite and vomiting -reglan  Continue diet and meds as discussed. Further disposition pending results of labs. Discussed med's effects and SE's.    HPI 64 y.o. female  presents for 3 month follow up with hypertension, hyperlipidemia, diabetes and vitamin D deficiency.   Her blood pressure has not been controlled at home, today their BP is BP: (!) 176/74 mmHg.She does workout. She denies chest pain, shortness of breath, dizziness.  She reports that she just started the atenolol on Wednesday.  She reports that the endocrinologist told her to start this.  She has not been eating a lot of salts.     She is on cholesterol medication and denies myalgias. Her cholesterol is at goal. The cholesterol was:  09/01/2014: Cholesterol 170; HDL 43*; LDL Cholesterol 72; Triglycerides 277*   She has been working on diet and exercise for diabetes with diabetic chronic kidney disease and with diabetic retinopathy; moderate non-proliferative; without macular edema and with other diabetic opthalmic complication, she is not on bASA, she is on ACE/ARB, and denies  foot ulcerations, hyperglycemia, hypoglycemia , increased appetite, nausea, paresthesia of the feet, polydipsia, polyuria, vomiting and weight loss. Last A1C was: 09/01/2014: Hgb A1c MFr Bld 8.5*  She is seeing  endocrinology.  They recently increased her insulin and also increased her blood pressure medication.    Patient is on Vitamin D supplement. 09/01/2014: Vit D, 25-Hydroxy 39  She reports that she is due to see her ophthalmologist next week.  She feels like her vision is getting a little bit worse.    She does report that her appetite is very poor.  She makes herself eat at least once a day and one snack per day. She reports that she is full very early as well.  She reports that she feels better when she eats but she just doesn't have the desire.     Current Medications:  Current Outpatient Prescriptions on File Prior to Visit  Medication Sig Dispense Refill  . amLODipine-benazepril (LOTREL) 10-20 MG per capsule Take 1 capsule by mouth daily.    . cloNIDine (CATAPRES - DOSED IN MG/24 HR) 0.3 mg/24hr patch Place 0.3 mg onto the skin once a week.    . Cyanocobalamin (VITAMIN B 12 PO) Take 1,000 mcg by mouth daily.    Marland Kitchen doxycycline (VIBRA-TABS) 100 MG tablet Take 1 tablet (100 mg total) by mouth 2 (two) times daily. 20 tablet 0  . ezetimibe (ZETIA) 10 MG tablet Take 10 mg by mouth daily.    . furosemide (LASIX) 20 MG tablet Take 20 mg by mouth.    Marland Kitchen glucose blood test strip 1 each by Other route as needed for other. Use as instructed    . insulin glargine (LANTUS) 100 UNIT/ML injection Inject into the skin at bedtime. Taking 10 units daily    . levothyroxine (SYNTHROID, LEVOTHROID) 150 MCG  tablet Take 150 mcg by mouth daily before breakfast.    . losartan (COZAAR) 100 MG tablet Take 100 mg by mouth daily.    Marland Kitchen OVER THE COUNTER MEDICATION Iron 65 mg 1 daily    . Vitamin D, Ergocalciferol, (DRISDOL) 50000 UNITS CAPS capsule Take 1 cap daily or as directed. 30 capsule 1   No current facility-administered medications on file prior to visit.   Medical History: No past medical history on file. Allergies:  Allergies  Allergen Reactions  . Azor [Amlodipine-Olmesartan]   . Hydralazine     fatigue   . Hydrochlorothiazide W-Triamterene   . Labetalol     Per patient, caused foot pain  . Lisinopril Other (See Comments)    Severe radiating foot pain.  . Metformin   . Minoxidil     fatigue  . Other     Diazide pt states cause throat to swell.  Marland Kitchen Spironolactone Other (See Comments)    Severe radiating pain in feet.  . Statins     Cause fatigue     Review of Systems:  Review of Systems  Constitutional: Negative for fever, chills and malaise/fatigue.       Change in appetite.  HENT: Negative for congestion, ear pain and sore throat.   Eyes: Positive for blurred vision and double vision.  Respiratory: Negative for cough, shortness of breath and wheezing.   Cardiovascular: Negative for chest pain, palpitations and leg swelling.  Gastrointestinal: Positive for vomiting. Negative for heartburn, nausea, diarrhea, constipation, blood in stool and melena.  Genitourinary: Negative.   Skin: Negative.   Neurological: Negative for dizziness, sensory change, loss of consciousness and headaches.  Psychiatric/Behavioral: Negative for depression. The patient is not nervous/anxious and does not have insomnia.     Family history- Review and unchanged  Social history- Review and unchanged  Physical Exam: BP 176/74 mmHg  Pulse 62  Temp(Src) 98.4 F (36.9 C) (Temporal)  Resp 16  Ht 5\' 6"  (1.676 m)  Wt 165 lb (74.844 kg)  BMI 26.64 kg/m2  LMP  (LMP Unknown) Wt Readings from Last 3 Encounters:  12/14/14 165 lb (74.844 kg)  12/09/14 161 lb 6.4 oz (73.211 kg)  12/07/14 162 lb 6.4 oz (73.664 kg)   General Appearance: Well nourished well developed, non-toxic appearing, in no apparent distress. Eyes: PERRLA, EOMs, conjunctiva no swelling or erythema ENT/Mouth: Ear canals clear with no erythema, swelling, or discharge.  TMs normal bilaterally, oropharynx clear, moist, with no exudate.   Neck: Supple, thyroid normal, no JVD, no cervical adenopathy.  Respiratory: Respiratory effort normal,  breath sounds clear A&P, no wheeze, rhonchi or rales noted.  No retractions, no accessory muscle usage Cardio: RRR with no MRGs. No noted edema.  Abdomen: Soft, + BS.  Non tender, no guarding, rebound, hernias, masses. Musculoskeletal: Full ROM, 5/5 strength, Normal gait Skin: Warm, dry without rashes, lesions, ecchymosis.  Neuro: Awake and oriented X 3, Cranial nerves intact. No cerebellar symptoms.  Psych: normal affect, Insight and Judgment appropriate.    Starlyn Skeans, PA-C 3:09 PM [facility]

## 2014-12-15 LAB — INSULIN, RANDOM: Insulin: 20.2 u[IU]/mL — ABNORMAL HIGH (ref 2.0–19.6)

## 2014-12-15 LAB — VITAMIN D 25 HYDROXY (VIT D DEFICIENCY, FRACTURES): VIT D 25 HYDROXY: 43 ng/mL (ref 30–100)

## 2014-12-30 ENCOUNTER — Ambulatory Visit (INDEPENDENT_AMBULATORY_CARE_PROVIDER_SITE_OTHER): Admitting: Podiatry

## 2014-12-30 ENCOUNTER — Encounter: Payer: Self-pay | Admitting: Podiatry

## 2014-12-30 VITALS — BP 133/68 | HR 59 | Resp 14

## 2014-12-30 DIAGNOSIS — M79606 Pain in leg, unspecified: Secondary | ICD-10-CM | POA: Diagnosis not present

## 2014-12-30 DIAGNOSIS — B351 Tinea unguium: Secondary | ICD-10-CM | POA: Diagnosis not present

## 2014-12-30 NOTE — Progress Notes (Signed)
Patient ID: Denise Bush, female   DOB: 07-10-1950, 64 y.o.   MRN: [id number] Complaint:  Visit Type: Patient returns to my office for continued preventative foot care services. Complaint: Patient states" my nails have grown long and thick and become painful to walk and wear shoes" Patient has been diagnosed with DM with no foot complications. The patient presents for preventative foot care services. No changes to ROS.  She is under dialysis treatment.  Podiatric Exam: Vascular: dorsalis pedis and posterior tibial pulses are palpable bilateral. Capillary return is immediate. Temperature gradient is WNL. Skin turgor WNL  Sensorium: Diminished  Semmes Weinstein monofilament test. Normal tactile sensation bilaterally. Nail Exam: Pt has thick disfigured discolored nails with subungual debris noted bilateral entire nail hallux through fifth toenails Ulcer Exam: There is no evidence of ulcer or pre-ulcerative changes or infection. Orthopedic Exam: Muscle tone and strength are WNL. No limitations in general ROM. No crepitus or effusions noted. Foot type and digits show no abnormalities. Bony prominences are unremarkable. Skin: No Porokeratosis. No infection or ulcers  Diagnosis:  Onychomycosis, , Pain in right toe, pain in left toes  Treatment & Plan Procedures and Treatment: Consent by patient was obtained for treatment procedures. The patient understood the discussion of treatment and procedures well. All questions were answered thoroughly reviewed. Debridement of mycotic and hypertrophic toenails, 1 through 5 bilateral and clearing of subungual debris. No ulceration, no infection noted.  Return Visit-Office Procedure: Patient instructed to return to the office for a follow up visit 3 months for continued evaluation and treatment.

## 2015-01-07 ENCOUNTER — Telehealth: Payer: Self-pay | Admitting: *Deleted

## 2015-01-07 NOTE — Telephone Encounter (Signed)
Spouse called and asked when patient should stop her ASA for her upcoming surgery.  Per Dr Melford Aase, stop 5 days ahead and restart the afternoon of surgery.

## 2015-01-25 ENCOUNTER — Other Ambulatory Visit: Payer: Self-pay

## 2015-01-25 MED ORDER — FUROSEMIDE 40 MG PO TABS: 40.0000 mg | ORAL_TABLET | Freq: Every day | ORAL | Status: DC

## 2015-03-22 ENCOUNTER — Encounter: Payer: Self-pay | Admitting: Internal Medicine

## 2015-03-22 ENCOUNTER — Ambulatory Visit (INDEPENDENT_AMBULATORY_CARE_PROVIDER_SITE_OTHER): Admitting: Internal Medicine

## 2015-03-22 VITALS — BP 152/70 | HR 60 | Temp 97.9°F | Resp 16 | Ht 66.0 in | Wt 141.8 lb

## 2015-03-22 DIAGNOSIS — E559 Vitamin D deficiency, unspecified: Secondary | ICD-10-CM | POA: Diagnosis not present

## 2015-03-22 DIAGNOSIS — E1022 Type 1 diabetes mellitus with diabetic chronic kidney disease: Secondary | ICD-10-CM | POA: Diagnosis not present

## 2015-03-22 DIAGNOSIS — N185 Chronic kidney disease, stage 5: Secondary | ICD-10-CM

## 2015-03-22 DIAGNOSIS — E782 Mixed hyperlipidemia: Secondary | ICD-10-CM

## 2015-03-22 DIAGNOSIS — N186 End stage renal disease: Secondary | ICD-10-CM

## 2015-03-22 DIAGNOSIS — Z6826 Body mass index (BMI) 26.0-26.9, adult: Secondary | ICD-10-CM

## 2015-03-22 DIAGNOSIS — E1122 Type 2 diabetes mellitus with diabetic chronic kidney disease: Secondary | ICD-10-CM

## 2015-03-22 DIAGNOSIS — E039 Hypothyroidism, unspecified: Secondary | ICD-10-CM

## 2015-03-22 DIAGNOSIS — I10 Essential (primary) hypertension: Secondary | ICD-10-CM | POA: Diagnosis not present

## 2015-03-22 DIAGNOSIS — Z992 Dependence on renal dialysis: Secondary | ICD-10-CM | POA: Diagnosis not present

## 2015-03-22 DIAGNOSIS — Z6824 Body mass index (BMI) 24.0-24.9, adult: Secondary | ICD-10-CM | POA: Insufficient documentation

## 2015-03-22 DIAGNOSIS — Z79899 Other long term (current) drug therapy: Secondary | ICD-10-CM

## 2015-03-22 DIAGNOSIS — E663 Overweight: Secondary | ICD-10-CM | POA: Insufficient documentation

## 2015-03-22 DIAGNOSIS — Z794 Long term (current) use of insulin: Secondary | ICD-10-CM | POA: Diagnosis not present

## 2015-03-22 HISTORY — DX: End stage renal disease: N18.6

## 2015-03-22 HISTORY — DX: End stage renal disease: E11.22

## 2015-03-22 LAB — CBC WITH DIFFERENTIAL/PLATELET
BASOS ABS: 0.1 10*3/uL (ref 0.0–0.1)
Basophils Relative: 1 % (ref 0–1)
EOS ABS: 0.1 10*3/uL (ref 0.0–0.7)
EOS PCT: 1 % (ref 0–5)
HEMATOCRIT: 33.8 % — AB (ref 36.0–46.0)
Hemoglobin: 10.4 g/dL — ABNORMAL LOW (ref 12.0–15.0)
LYMPHS ABS: 2.1 10*3/uL (ref 0.7–4.0)
Lymphocytes Relative: 35 % (ref 12–46)
MCH: 28.3 pg (ref 26.0–34.0)
MCHC: 30.8 g/dL (ref 30.0–36.0)
MCV: 91.8 fL (ref 78.0–100.0)
MONO ABS: 0.4 10*3/uL (ref 0.1–1.0)
MPV: 10.8 fL (ref 8.6–12.4)
Monocytes Relative: 6 % (ref 3–12)
Neutro Abs: 3.5 10*3/uL (ref 1.7–7.7)
Neutrophils Relative %: 57 % (ref 43–77)
PLATELETS: 271 10*3/uL (ref 150–400)
RBC: 3.68 MIL/uL — ABNORMAL LOW (ref 3.87–5.11)
RDW: 17.7 % — AB (ref 11.5–15.5)
WBC: 6.1 10*3/uL (ref 4.0–10.5)

## 2015-03-22 LAB — LIPID PANEL
Cholesterol: 155 mg/dL (ref 125–200)
HDL: 45 mg/dL — ABNORMAL LOW (ref >=46)
LDL Cholesterol: 77 mg/dL (ref <130)
TRIGLYCERIDES: 164 mg/dL — AB (ref <150)
Total CHOL/HDL Ratio: 3.4 Ratio (ref <=5.0)
VLDL: 33 mg/dL — AB (ref <30)

## 2015-03-22 LAB — BASIC METABOLIC PANEL WITH GFR
BUN: 32 mg/dL — AB (ref 7–25)
CALCIUM: 9.6 mg/dL (ref 8.6–10.4)
CO2: 31 mmol/L (ref 20–31)
CREATININE: 6.69 mg/dL — AB (ref 0.50–0.99)
Chloride: 99 mmol/L (ref 98–110)
GFR, EST AFRICAN AMERICAN: 7 mL/min — AB (ref >=60)
GFR, Est Non African American: 6 mL/min — ABNORMAL LOW (ref >=60)
GLUCOSE: 202 mg/dL — AB (ref 65–99)
POTASSIUM: 4.5 mmol/L (ref 3.5–5.3)
Sodium: 143 mmol/L (ref 135–146)

## 2015-03-22 LAB — HEMOGLOBIN A1C
Hgb A1c MFr Bld: 6.3 % — ABNORMAL HIGH (ref <5.7)
MEAN PLASMA GLUCOSE: 134 mg/dL — AB (ref <117)

## 2015-03-22 LAB — HEPATIC FUNCTION PANEL
ALBUMIN: 3.7 g/dL (ref 3.6–5.1)
ALK PHOS: 47 U/L (ref 33–130)
ALT: 7 U/L (ref 6–29)
AST: 11 U/L (ref 10–35)
BILIRUBIN TOTAL: 0.4 mg/dL (ref 0.2–1.2)
Bilirubin, Direct: 0.1 mg/dL (ref <=0.2)
Indirect Bilirubin: 0.3 mg/dL (ref 0.2–1.2)
TOTAL PROTEIN: 6.8 g/dL (ref 6.1–8.1)

## 2015-03-22 LAB — MAGNESIUM: Magnesium: 2.4 mg/dL (ref 1.5–2.5)

## 2015-03-22 NOTE — Patient Instructions (Signed)

## 2015-03-22 NOTE — Progress Notes (Signed)
Patient ID: Denise Bush, female   DOB: 08-Aug-1950, 64 y.o.   MRN: [id number]   This very nice 64 y.o. [name] presents for  follow up with Hypertension, Hyperlipidemia, T2_DM w/ESRD and Vitamin D Deficiency. Patient relates she was just recently started on Dialysis and is followed by Dr's Ave Filter. She reports improved sense of well-being since starting Dialysis treatment and feels that psychologically that she is adjusting to the change in life style for Dialysis 3 x/week on TThSat's. .    Patient is treated for HTN since 1998 & BP has been controlled at home. Today's BP: (!) 152/70 mmHg. Patient has had no complaints of any cardiac type chest pain, palpitations, dyspnea/orthopnea/PND, dizziness, claudication, or dependent edema.   Hyperlipidemia is controlled with diet & meds. Patient denies myalgias or other med SE's. Last Lipids were at goal with Cholesterol 154; HDL 45*; LDL 75; Triglycerides 171 on 12/14/2014.    Also, the patient has history of T2_DM treated with oral agents from 1988 til started on insulin by Dr Steffanie Dunn an Endocrinologist in [place] and now recently has stopped her insulin as her renal function has deteriorated. Since off of insulin she denies any recent symptoms of reactive hypoglycemia, diabetic polys, paresthesias or visual blurring.  Last A1c was  9.1% on 12/14/2014.   Further, the patient also has history of Vitamin D Deficiency and supplements vitamin D without any suspected side-effects. Last vitamin D was 43 on 12/14/2014.  Medication Sig  . amLODipine-benazepril  10-20 MG Take 1 capsule by mouth daily.  Marland Kitchen atenolol  100 MG tablet Take 100 mg by mouth daily.  Marland Kitchen VITAMIN D 09811 UNITS CAPS Take 50,000 Units by mouth.  . cloNIDine  0.3 mg/24hr patch Place 0.3 mg onto the skin once a week.  . ezetimibe  10 MG tablet Take 10 mg by mouth daily.  . furosemide 40 MG tablet Take 1 tablet (40 mg total) by mouth daily.  Marland Kitchen levothyroxine  150 MCG tablet Take 150 mcg by  mouth daily before breakfast.  . losartan  100 MG tablet Take 100 mg by mouth daily.  . Potassium 99 MG TABS Take by mouth.   Allergies  Allergen Reactions  . Hydralazine Shortness Of Breath    Chest pain fatigue  . Azor [Amlodipine-Olmesartan]   . Hydrochlorothiazide W-Triamterene   . Labetalol     Per patient, caused foot pain  . Lisinopril Other (See Comments)    Severe radiating foot pain.  . Metformin Itching    Loss of bowel control, general sick feeling  . Other     Diazide pt states cause throat to swell.  . Minoxidil Rash    Choking, neck contractions fatigue  . Spironolactone Other (See Comments) and Rash    Severe radiating pain in feet Severe radiating pain in feet.  . Statins Rash    Fatigue Cause fatigue    PMHx:  No past medical history on file. Immunization History  Administered Date(s) Administered  . Influenza Whole 04/23/2007   No past surgical history on file. FHx:    Reviewed / unchanged  SHx:    Reviewed / unchanged  Systems Review:  Constitutional: Denies fever, chills, wt changes, headaches, insomnia, fatigue, night sweats, change in appetite. Eyes: Denies redness, blurred vision, diplopia, discharge, itchy, watery eyes.  ENT: Denies discharge, congestion, post nasal drip, epistaxis, sore throat, earache, hearing loss, dental pain, tinnitus, vertigo, sinus pain, snoring.  CV: Denies chest pain, palpitations, irregular heartbeat, syncope, dyspnea,  diaphoresis, orthopnea, PND, claudication or edema. Respiratory: denies cough, dyspnea, DOE, pleurisy, hoarseness, laryngitis, wheezing.  Gastrointestinal: Denies dysphagia, odynophagia, heartburn, reflux, water brash, abdominal pain or cramps, nausea, vomiting, bloating, diarrhea, constipation, hematemesis, melena, hematochezia  or hemorrhoids. Genitourinary: Denies dysuria, frequency, urgency, nocturia, hesitancy, discharge, hematuria or flank pain. Musculoskeletal: Denies arthralgias, myalgias,  stiffness, jt. swelling, pain, limping or strain/sprain.  Skin: Denies pruritus, rash, hives, warts, acne, eczema or change in skin lesion(s). Neuro: No weakness, tremor, incoordination, spasms, paresthesia or pain. Psychiatric: Denies confusion, memory loss or sensory loss. Endo: Denies change in weight, skin or hair change.  Heme/Lymph: No excessive bleeding, bruising or enlarged lymph nodes.  Physical Exam  BP 152/70 mmHg  Pulse 60  Temp(Src) 97.9 F (36.6 C)  Resp 16  Ht 5\' 6"  (1.676 m)  Wt 141 lb 12.8 oz (64.32 kg)  BMI 22.90 kg/m2  LMP  (LMP Unknown)  Appears well nourished and in no distress. Eyes: PERRLA, EOMs, conjunctiva no swelling or erythema. Sinuses: No frontal/maxillary tenderness ENT/Mouth: EAC's clear, TM's nl w/o erythema, bulging. Nares clear w/o erythema, swelling, exudates. Oropharynx clear without erythema or exudates. Oral hygiene is good. Tongue normal, non obstructing. Hearing intact.  Neck: Supple. Thyroid nl. Car 2+/2+ without bruits, nodes or JVD. Chest: Respirations nl with BS clear & equal w/o rales, rhonchi, wheezing or stridor.  Cor: Heart sounds normal w/ regular rate and rhythm without sig. murmurs, gallops, clicks, or rubs. Peripheral pulses normal and equal  without edema.  Abdomen: Soft & bowel sounds normal. Non-tender w/o guarding, rebound, hernias, masses, or organomegaly.  Lymphatics: Unremarkable.  Musculoskeletal: Full ROM all peripheral extremities, joint stability, 5/5 strength, and normal gait.  Skin: Warm, dry without exposed rashes, lesions or ecchymosis apparent.  Neuro: Cranial nerves intact, reflexes equal bilaterally. Sensory-motor testing grossly intact. Tendon reflexes grossly intact.  Pysch: Alert & oriented x 3.  Insight and judgement nl & appropriate. No ideations.  Assessment and Plan:  1. Essential hypertension  - TSH  2. Mixed hyperlipidemia  - Lipid panel  3. Type 2 diabetes mellitus with ESRD on chronic dialysis  (HCC)  - Hemoglobin A1c  4. Vitamin D deficiency  - VITAMIN D 25 Hydroxy   5. End stage kidney disease ([place])   6. Hypothyroidism   7. Medication management  - CBC with Differential/Platelet - BASIC METABOLIC PANEL WITH GFR - Hepatic function panel - Magnesium  8. BMI 26.0-26.9,adult   Recommended regular exercise, BP monitoring, weight control, and discussed med and SE's. Recommended labs to assess and monitor clinical status. Further disposition pending results of labs. Over 30 minutes of exam, counseling, chart review was performed

## 2015-03-23 LAB — VITAMIN D 25 HYDROXY (VIT D DEFICIENCY, FRACTURES): Vit D, 25-Hydroxy: 56 ng/mL (ref 30–100)

## 2015-03-23 LAB — TSH: TSH: 1.98 u[IU]/mL (ref 0.350–4.500)

## 2015-04-01 ENCOUNTER — Ambulatory Visit: Admitting: Podiatry

## 2015-04-07 ENCOUNTER — Ambulatory Visit (INDEPENDENT_AMBULATORY_CARE_PROVIDER_SITE_OTHER): Admitting: Podiatry

## 2015-04-07 ENCOUNTER — Encounter: Payer: Self-pay | Admitting: Podiatry

## 2015-04-07 DIAGNOSIS — B351 Tinea unguium: Secondary | ICD-10-CM | POA: Diagnosis not present

## 2015-04-07 DIAGNOSIS — E1040 Type 1 diabetes mellitus with diabetic neuropathy, unspecified: Secondary | ICD-10-CM

## 2015-04-07 DIAGNOSIS — M79606 Pain in leg, unspecified: Secondary | ICD-10-CM

## 2015-04-07 NOTE — Progress Notes (Signed)
Patient ID: Denise Bush, female   DOB: [birth year], 64 y.o.   MRN: [id number] Complaint:  Visit Type: Patient returns to my office for continued preventative foot care services. Complaint: Patient states" my nails have grown long and thick and become painful to walk and wear shoes" Patient has been diagnosed with DM with no foot complications. The patient presents for preventative foot care services. No changes to ROS.  She is under dialysis treatment.  Podiatric Exam: Vascular: dorsalis pedis and posterior tibial pulses are palpable bilateral. Capillary return is immediate. Temperature gradient is WNL. Skin turgor WNL  Sensorium: Diminished  Semmes Weinstein monofilament test. Normal tactile sensation bilaterally. Nail Exam: Pt has thick disfigured discolored nails with subungual debris noted bilateral entire nail hallux through fifth toenails Ulcer Exam: There is no evidence of ulcer or pre-ulcerative changes or infection. Orthopedic Exam: Muscle tone and strength are WNL. No limitations in general ROM. No crepitus or effusions noted. Foot type and digits show no abnormalities. Bony prominences are unremarkable. Skin:  Porokeratosis sub 5th met left foot. No infection or ulcers  Diagnosis:  Onychomycosis, , Pain in right toe, pain in left toes,  Porokeratosis.  Treatment & Plan Procedures and Treatment: Consent by patient was obtained for treatment procedures. The patient understood the discussion of treatment and procedures well. All questions were answered thoroughly reviewed. Debridement of mycotic and hypertrophic toenails, 1 through 5 bilateral and clearing of subungual debris. No ulceration, no infection noted. Debride porokeratosis.  She presents with an open lesion under the ball of the right foot with no signs of infection or drainage.  Applied silvadede/DSD  Call office if this becomes painful or infected. Return Visit-Office Procedure: Patient instructed to return to the office for a  follow up visit 3 months for continued evaluation and treatment.If this condition worsens or becomes very painful, the patient was told to contact this office or go to the Emergency Department at the hospital.  Gardiner Barefoot DPM

## 2015-04-26 ENCOUNTER — Encounter: Payer: Self-pay | Admitting: *Deleted

## 2015-06-03 DIAGNOSIS — N186 End stage renal disease: Secondary | ICD-10-CM | POA: Diagnosis not present

## 2015-06-03 DIAGNOSIS — D631 Anemia in chronic kidney disease: Secondary | ICD-10-CM | POA: Diagnosis not present

## 2015-06-03 DIAGNOSIS — N2581 Secondary hyperparathyroidism of renal origin: Secondary | ICD-10-CM | POA: Diagnosis not present

## 2015-06-05 DIAGNOSIS — N186 End stage renal disease: Secondary | ICD-10-CM | POA: Diagnosis not present

## 2015-06-05 DIAGNOSIS — N2581 Secondary hyperparathyroidism of renal origin: Secondary | ICD-10-CM | POA: Diagnosis not present

## 2015-06-05 DIAGNOSIS — D631 Anemia in chronic kidney disease: Secondary | ICD-10-CM | POA: Diagnosis not present

## 2015-06-08 DIAGNOSIS — N2581 Secondary hyperparathyroidism of renal origin: Secondary | ICD-10-CM | POA: Diagnosis not present

## 2015-06-08 DIAGNOSIS — D631 Anemia in chronic kidney disease: Secondary | ICD-10-CM | POA: Diagnosis not present

## 2015-06-08 DIAGNOSIS — N186 End stage renal disease: Secondary | ICD-10-CM | POA: Diagnosis not present

## 2015-06-10 DIAGNOSIS — N186 End stage renal disease: Secondary | ICD-10-CM | POA: Diagnosis not present

## 2015-06-10 DIAGNOSIS — D631 Anemia in chronic kidney disease: Secondary | ICD-10-CM | POA: Diagnosis not present

## 2015-06-10 DIAGNOSIS — N2581 Secondary hyperparathyroidism of renal origin: Secondary | ICD-10-CM | POA: Diagnosis not present

## 2015-06-12 DIAGNOSIS — N2581 Secondary hyperparathyroidism of renal origin: Secondary | ICD-10-CM | POA: Diagnosis not present

## 2015-06-12 DIAGNOSIS — D631 Anemia in chronic kidney disease: Secondary | ICD-10-CM | POA: Diagnosis not present

## 2015-06-12 DIAGNOSIS — N186 End stage renal disease: Secondary | ICD-10-CM | POA: Diagnosis not present

## 2015-06-15 DIAGNOSIS — N186 End stage renal disease: Secondary | ICD-10-CM | POA: Diagnosis not present

## 2015-06-15 DIAGNOSIS — N2581 Secondary hyperparathyroidism of renal origin: Secondary | ICD-10-CM | POA: Diagnosis not present

## 2015-06-15 DIAGNOSIS — D631 Anemia in chronic kidney disease: Secondary | ICD-10-CM | POA: Diagnosis not present

## 2015-06-17 DIAGNOSIS — N2581 Secondary hyperparathyroidism of renal origin: Secondary | ICD-10-CM | POA: Diagnosis not present

## 2015-06-17 DIAGNOSIS — N186 End stage renal disease: Secondary | ICD-10-CM | POA: Diagnosis not present

## 2015-06-17 DIAGNOSIS — D631 Anemia in chronic kidney disease: Secondary | ICD-10-CM | POA: Diagnosis not present

## 2015-06-19 DIAGNOSIS — N186 End stage renal disease: Secondary | ICD-10-CM | POA: Diagnosis not present

## 2015-06-19 DIAGNOSIS — D631 Anemia in chronic kidney disease: Secondary | ICD-10-CM | POA: Diagnosis not present

## 2015-06-19 DIAGNOSIS — N2581 Secondary hyperparathyroidism of renal origin: Secondary | ICD-10-CM | POA: Diagnosis not present

## 2015-06-22 DIAGNOSIS — N2581 Secondary hyperparathyroidism of renal origin: Secondary | ICD-10-CM | POA: Diagnosis not present

## 2015-06-22 DIAGNOSIS — N186 End stage renal disease: Secondary | ICD-10-CM | POA: Diagnosis not present

## 2015-06-22 DIAGNOSIS — D631 Anemia in chronic kidney disease: Secondary | ICD-10-CM | POA: Diagnosis not present

## 2015-06-24 DIAGNOSIS — N2581 Secondary hyperparathyroidism of renal origin: Secondary | ICD-10-CM | POA: Diagnosis not present

## 2015-06-24 DIAGNOSIS — N186 End stage renal disease: Secondary | ICD-10-CM | POA: Diagnosis not present

## 2015-06-24 DIAGNOSIS — D631 Anemia in chronic kidney disease: Secondary | ICD-10-CM | POA: Diagnosis not present

## 2015-06-25 DIAGNOSIS — N2581 Secondary hyperparathyroidism of renal origin: Secondary | ICD-10-CM | POA: Diagnosis not present

## 2015-06-25 DIAGNOSIS — Z114 Encounter for screening for human immunodeficiency virus [HIV]: Secondary | ICD-10-CM | POA: Diagnosis not present

## 2015-06-25 DIAGNOSIS — N186 End stage renal disease: Secondary | ICD-10-CM | POA: Diagnosis not present

## 2015-06-25 DIAGNOSIS — Z992 Dependence on renal dialysis: Secondary | ICD-10-CM | POA: Diagnosis not present

## 2015-06-25 DIAGNOSIS — N185 Chronic kidney disease, stage 5: Secondary | ICD-10-CM | POA: Diagnosis not present

## 2015-06-25 DIAGNOSIS — Z794 Long term (current) use of insulin: Secondary | ICD-10-CM | POA: Diagnosis not present

## 2015-06-25 DIAGNOSIS — E1122 Type 2 diabetes mellitus with diabetic chronic kidney disease: Secondary | ICD-10-CM | POA: Diagnosis not present

## 2015-06-25 DIAGNOSIS — E083513 Diabetes mellitus due to underlying condition with proliferative diabetic retinopathy with macular edema, bilateral: Secondary | ICD-10-CM | POA: Diagnosis not present

## 2015-06-25 DIAGNOSIS — Z01818 Encounter for other preprocedural examination: Secondary | ICD-10-CM | POA: Diagnosis not present

## 2015-06-25 DIAGNOSIS — H53131 Sudden visual loss, right eye: Secondary | ICD-10-CM | POA: Diagnosis not present

## 2015-06-25 DIAGNOSIS — I10 Essential (primary) hypertension: Secondary | ICD-10-CM | POA: Diagnosis not present

## 2015-06-25 DIAGNOSIS — Z7682 Awaiting organ transplant status: Secondary | ICD-10-CM | POA: Diagnosis not present

## 2015-06-25 DIAGNOSIS — E782 Mixed hyperlipidemia: Secondary | ICD-10-CM | POA: Diagnosis not present

## 2015-06-25 DIAGNOSIS — I120 Hypertensive chronic kidney disease with stage 5 chronic kidney disease or end stage renal disease: Secondary | ICD-10-CM | POA: Diagnosis not present

## 2015-06-25 HISTORY — DX: Secondary hyperparathyroidism of renal origin: N25.81

## 2015-06-26 DIAGNOSIS — N186 End stage renal disease: Secondary | ICD-10-CM | POA: Diagnosis not present

## 2015-06-26 DIAGNOSIS — D631 Anemia in chronic kidney disease: Secondary | ICD-10-CM | POA: Diagnosis not present

## 2015-06-26 DIAGNOSIS — N2581 Secondary hyperparathyroidism of renal origin: Secondary | ICD-10-CM | POA: Diagnosis not present

## 2015-06-28 ENCOUNTER — Ambulatory Visit (INDEPENDENT_AMBULATORY_CARE_PROVIDER_SITE_OTHER): Payer: Medicare Other | Admitting: Internal Medicine

## 2015-06-28 ENCOUNTER — Encounter: Payer: Self-pay | Admitting: Internal Medicine

## 2015-06-28 ENCOUNTER — Other Ambulatory Visit: Payer: Self-pay | Admitting: Internal Medicine

## 2015-06-28 VITALS — BP 158/60 | HR 90 | Temp 98.2°F | Resp 16 | Ht 66.0 in | Wt 145.0 lb

## 2015-06-28 DIAGNOSIS — E782 Mixed hyperlipidemia: Secondary | ICD-10-CM | POA: Diagnosis not present

## 2015-06-28 DIAGNOSIS — I10 Essential (primary) hypertension: Secondary | ICD-10-CM | POA: Diagnosis not present

## 2015-06-28 DIAGNOSIS — E559 Vitamin D deficiency, unspecified: Secondary | ICD-10-CM | POA: Diagnosis not present

## 2015-06-28 DIAGNOSIS — M25561 Pain in right knee: Secondary | ICD-10-CM | POA: Diagnosis not present

## 2015-06-28 DIAGNOSIS — Z992 Dependence on renal dialysis: Principal | ICD-10-CM

## 2015-06-28 DIAGNOSIS — E1122 Type 2 diabetes mellitus with diabetic chronic kidney disease: Secondary | ICD-10-CM

## 2015-06-28 DIAGNOSIS — Z1211 Encounter for screening for malignant neoplasm of colon: Secondary | ICD-10-CM | POA: Diagnosis not present

## 2015-06-28 DIAGNOSIS — Z79899 Other long term (current) drug therapy: Secondary | ICD-10-CM

## 2015-06-28 DIAGNOSIS — N186 End stage renal disease: Secondary | ICD-10-CM | POA: Diagnosis not present

## 2015-06-28 DIAGNOSIS — E1129 Type 2 diabetes mellitus with other diabetic kidney complication: Secondary | ICD-10-CM | POA: Diagnosis not present

## 2015-06-28 NOTE — Patient Instructions (Signed)
Please take a 1/2 tablet of losartan if blood pressure is consistently greater 150/75-80.  Please do not take on days when dialysis is happening.

## 2015-06-28 NOTE — Progress Notes (Signed)
Assessment and Plan:  Hypertension:  -losartan 1/2 tablet if BP 150/80 or greater on off days -Continue medication -monitor blood pressure at home. -Continue DASH diet -Reminder to go to the ER if any CP, SOB, nausea, dizziness, severe HA, changes vision/speech, left arm numbness and tingling and jaw pain.  Cholesterol - Continue diet and exercise -Check cholesterol.   Diabetes with diabetic chronic kidney disease -Continue diet and exercise.  -Check A1C  Vitamin D Def -check level -continue medications.   ESRD -colonoscopy -stress test -CT abdomen and pelvis  Right Knee Pain -referral to ortho  Continue diet and meds as discussed. Further disposition pending results of labs. Discussed med's effects and SE's.    HPI 65 y.o. female  presents for 3 month follow up with hypertension, hyperlipidemia, diabetes and vitamin D deficiency.   Her blood pressure has been controlled at home, today their BP is BP: (!) 158/60 mmHg.She does not workout. She denies chest pain, shortness of breath, dizziness.  She reports that she is having some low blood pressures after dialysis.  She hasn't been taking her medications.  She reports that she stopped them approximately 1 month ago.     She is on cholesterol medication and denies myalgias. Her cholesterol is at goal. The cholesterol was:  03/22/2015: Cholesterol 155; HDL 45*; LDL Cholesterol 77; Triglycerides 164*   She has been working on diet and exercise for diabetes with diabetic chronic kidney disease, she is on bASA, she is on ACE/ARB, and denies  foot ulcerations, hyperglycemia, hypoglycemia , increased appetite, nausea, paresthesia of the feet, polydipsia, polyuria, visual disturbances, vomiting and weight loss. Last A1C was: 03/22/2015: Hgb A1c MFr Bld 6.3*   Patient is on Vitamin D supplement. 03/22/2015: Vit D, 25-Hydroxy 56  She is currently been having some right knee pain which has been bothering her.  She reports that she has  had surgery and has had cortisone injections.  She hasn't had any in several years.    In order to meet the qualifications for kidney transplant she needs to have a CT abdomen and pelvis w/o contrast, a stress test, and also a needs to have a colonoscopy.  We will start the referral process.   She is continuing to do dialysis T,Th, S.  She does get low BP from it.    Current Medications:  Current Outpatient Prescriptions on File Prior to Visit  Medication Sig Dispense Refill  . Cholecalciferol (VITAMIN D3) 50000 UNITS CAPS Take 50,000 Units by mouth.    . Cyanocobalamin (VITAMIN B 12 PO) Take 1,000 mcg by mouth daily.    Marland Kitchen ezetimibe (ZETIA) 10 MG tablet Take 10 mg by mouth daily.    Marland Kitchen levothyroxine (SYNTHROID, LEVOTHROID) 150 MCG tablet Take 150 mcg by mouth daily before breakfast.    . OVER THE COUNTER MEDICATION Iron 65 mg 1 daily    . amLODipine-benazepril (LOTREL) 10-20 MG per capsule Take 1 capsule by mouth daily. Reported on 06/28/2015    . glucose blood (PRODIGY NO CODING BLOOD GLUC) test strip 1 each.    . losartan (COZAAR) 100 MG tablet Take 100 mg by mouth daily. Reported on 06/28/2015     No current facility-administered medications on file prior to visit.   Medical History: No past medical history on file. Allergies:  Allergies  Allergen Reactions  . Hydralazine Shortness Of Breath    Chest pain fatigue  . Azor [Amlodipine-Olmesartan]   . Hydrochlorothiazide W-Triamterene   . Labetalol  Per patient, caused foot pain  . Lisinopril Other (See Comments)    Severe radiating foot pain.  . Metformin Itching    Loss of bowel control, general sick feeling  . Metoclopramide Other (See Comments)  . Other Other (See Comments)    "contractions in throat" Diazide pt states cause throat to swell.  . Minoxidil Rash    Choking, neck contractions fatigue  . Spironolactone Other (See Comments) and Rash    Severe radiating pain in feet Severe radiating pain in feet.  . Statins  Rash    Fatigue Cause fatigue     Review of Systems:  Review of Systems  Constitutional: Negative for fever, chills and malaise/fatigue.  HENT: Negative for congestion, ear pain and sore throat.   Respiratory: Negative for cough, shortness of breath and wheezing.   Cardiovascular: Negative for chest pain, palpitations and leg swelling.  Gastrointestinal: Positive for constipation. Negative for heartburn, diarrhea, blood in stool and melena.  Genitourinary: Negative.   Skin: Negative.   Neurological: Negative for dizziness, sensory change, loss of consciousness and headaches.  Psychiatric/Behavioral: Negative for depression. The patient is not nervous/anxious and does not have insomnia.     Family history- Review and unchanged  Social history- Review and unchanged  Physical Exam: BP 158/60 mmHg  Pulse 90  Temp(Src) 98.2 F (36.8 C) (Temporal)  Resp 16  Ht 5\' 6"  (1.676 m)  Wt 145 lb (65.772 kg)  BMI 23.41 kg/m2  LMP  (LMP Unknown) Wt Readings from Last 3 Encounters:  06/28/15 145 lb (65.772 kg)  03/22/15 141 lb 12.8 oz (64.32 kg)  12/14/14 165 lb (74.844 kg)   General Appearance: Well nourished well developed, non-toxic appearing, in no apparent distress. Eyes: PERRLA, EOMs, conjunctiva no swelling or erythema ENT/Mouth: Ear canals clear with no erythema, swelling, or discharge.  TMs normal bilaterally, oropharynx clear, moist, with no exudate.   Neck: Supple, thyroid normal, no JVD, no cervical adenopathy.  Respiratory: Respiratory effort normal, breath sounds clear A&P, no wheeze, rhonchi or rales noted.  No retractions, no accessory muscle usage Cardio: RRR with Thrill on the sternal border RGs. No noted edema.  Abdomen: Soft, + BS.  Non tender, no guarding, rebound, hernias, masses. Musculoskeletal: Full ROM, 5/5 strength, Normal gait Skin: Warm, dry without rashes, lesions, ecchymosis.  Neuro: Awake and oriented X 3, Cranial nerves intact. No cerebellar symptoms.   Psych: normal affect, Insight and Judgment appropriate.    Starlyn Skeans, PA-C 3:15 PM [facility]

## 2015-06-29 DIAGNOSIS — Z992 Dependence on renal dialysis: Secondary | ICD-10-CM | POA: Diagnosis not present

## 2015-06-29 DIAGNOSIS — N2581 Secondary hyperparathyroidism of renal origin: Secondary | ICD-10-CM | POA: Diagnosis not present

## 2015-06-29 DIAGNOSIS — N186 End stage renal disease: Secondary | ICD-10-CM | POA: Diagnosis not present

## 2015-06-29 DIAGNOSIS — D631 Anemia in chronic kidney disease: Secondary | ICD-10-CM | POA: Diagnosis not present

## 2015-06-29 DIAGNOSIS — E1022 Type 1 diabetes mellitus with diabetic chronic kidney disease: Secondary | ICD-10-CM | POA: Diagnosis not present

## 2015-06-29 LAB — CBC WITH DIFFERENTIAL/PLATELET
BASOS ABS: 0.1 10*3/uL (ref 0.0–0.1)
BASOS PCT: 1 % (ref 0–1)
Eosinophils Absolute: 0.1 10*3/uL (ref 0.0–0.7)
Eosinophils Relative: 2 % (ref 0–5)
HCT: 33.7 % — ABNORMAL LOW (ref 36.0–46.0)
HEMOGLOBIN: 10.7 g/dL — AB (ref 12.0–15.0)
Lymphocytes Relative: 40 % (ref 12–46)
Lymphs Abs: 2.3 10*3/uL (ref 0.7–4.0)
MCH: 30.6 pg (ref 26.0–34.0)
MCHC: 31.8 g/dL (ref 30.0–36.0)
MCV: 96.3 fL (ref 78.0–100.0)
MONOS PCT: 7 % (ref 3–12)
MPV: 10.8 fL (ref 8.6–12.4)
Monocytes Absolute: 0.4 10*3/uL (ref 0.1–1.0)
NEUTROS ABS: 2.9 10*3/uL (ref 1.7–7.7)
NEUTROS PCT: 50 % (ref 43–77)
Platelets: 299 10*3/uL (ref 150–400)
RBC: 3.5 MIL/uL — ABNORMAL LOW (ref 3.87–5.11)
RDW: 17.7 % — ABNORMAL HIGH (ref 11.5–15.5)
WBC: 5.8 10*3/uL (ref 4.0–10.5)

## 2015-06-29 LAB — LIPID PANEL
CHOL/HDL RATIO: 3.8 ratio (ref <=5.0)
Cholesterol: 177 mg/dL (ref 125–200)
HDL: 47 mg/dL (ref >=46)
LDL CALC: 68 mg/dL (ref <130)
TRIGLYCERIDES: 308 mg/dL — AB (ref <150)
VLDL: 62 mg/dL — AB (ref <30)

## 2015-06-29 LAB — HEPATIC FUNCTION PANEL
ALK PHOS: 106 U/L (ref 33–130)
ALT: 10 U/L (ref 6–29)
AST: 12 U/L (ref 10–35)
Albumin: 3.9 g/dL (ref 3.6–5.1)
BILIRUBIN DIRECT: 0.1 mg/dL (ref <=0.2)
BILIRUBIN INDIRECT: 0.2 mg/dL (ref 0.2–1.2)
Total Bilirubin: 0.3 mg/dL (ref 0.2–1.2)
Total Protein: 7 g/dL (ref 6.1–8.1)

## 2015-06-29 LAB — BASIC METABOLIC PANEL WITH GFR
BUN: 59 mg/dL — ABNORMAL HIGH (ref 7–25)
CHLORIDE: 101 mmol/L (ref 98–110)
CO2: 29 mmol/L (ref 20–31)
CREATININE: 8.81 mg/dL — AB (ref 0.50–0.99)
Calcium: 9.6 mg/dL (ref 8.6–10.4)
GFR, EST NON AFRICAN AMERICAN: 4 mL/min — AB (ref >=60)
GFR, Est African American: 5 mL/min — ABNORMAL LOW (ref >=60)
Glucose, Bld: 210 mg/dL — ABNORMAL HIGH (ref 65–99)
POTASSIUM: 5.5 mmol/L — AB (ref 3.5–5.3)
SODIUM: 143 mmol/L (ref 135–146)

## 2015-06-29 LAB — TSH: TSH: 0.58 m[IU]/L

## 2015-06-29 LAB — HEMOGLOBIN A1C
Hgb A1c MFr Bld: 7.3 % — ABNORMAL HIGH (ref <5.7)
Mean Plasma Glucose: 163 mg/dL — ABNORMAL HIGH (ref <117)

## 2015-06-30 ENCOUNTER — Encounter: Payer: Self-pay | Admitting: Gastroenterology

## 2015-06-30 ENCOUNTER — Ambulatory Visit (INDEPENDENT_AMBULATORY_CARE_PROVIDER_SITE_OTHER): Payer: Medicare Other | Admitting: Podiatry

## 2015-06-30 ENCOUNTER — Encounter: Payer: Self-pay | Admitting: Podiatry

## 2015-06-30 DIAGNOSIS — E1040 Type 1 diabetes mellitus with diabetic neuropathy, unspecified: Secondary | ICD-10-CM

## 2015-06-30 DIAGNOSIS — M79606 Pain in leg, unspecified: Secondary | ICD-10-CM

## 2015-06-30 DIAGNOSIS — B351 Tinea unguium: Secondary | ICD-10-CM

## 2015-06-30 NOTE — Progress Notes (Signed)
Patient ID: Denise Bush, female   DOB: 04/07/1951, 64 y.o.   MRN: [id number] Complaint:  Visit Type: Patient returns to my office for continued preventative foot care services. Complaint: Patient states" my nails have grown long and thick and become painful to walk and wear shoes" Patient has been diagnosed with DM with no foot complications. The patient presents for preventative foot care services. No changes to ROS.  She is under dialysis treatment. Open lesion ulcer right foot has healed. Podiatric Exam: Vascular: dorsalis pedis and posterior tibial pulses are palpable bilateral. Capillary return is immediate. Temperature gradient is WNL. Skin turgor WNL  Sensorium: Diminished  Semmes Weinstein monofilament test. Normal tactile sensation bilaterally. Nail Exam: Pt has thick disfigured discolored nails with subungual debris noted bilateral entire nail hallux through fifth toenails Ulcer Exam: There is no evidence of ulcer or pre-ulcerative changes or infection. Orthopedic Exam: Muscle tone and strength are WNL. No limitations in general ROM. No crepitus or effusions noted. Foot type and digits show no abnormalities. Bony prominences are unremarkable. Skin:  Asymptomatic  Porokeratosis sub 5th met left foot. No infection or ulcers  Diagnosis:  Onychomycosis, , Pain in right toe, pain in left toes, .  Treatment & Plan Procedures and Treatment: Consent by patient was obtained for treatment procedures. The patient understood the discussion of treatment and procedures well. All questions were answered thoroughly reviewed. Debridement of mycotic and hypertrophic toenails, 1 through 5 bilateral and clearing of subungual debris. No ulceration, no infection noted. Debride porokeratosis.  . Return Visit-Office Procedure: Patient instructed to return to the office for a follow up visit 3 months for continued evaluation and treatment..  Gregory Mayer DPM   

## 2015-07-01 DIAGNOSIS — D631 Anemia in chronic kidney disease: Secondary | ICD-10-CM | POA: Diagnosis not present

## 2015-07-01 DIAGNOSIS — N2581 Secondary hyperparathyroidism of renal origin: Secondary | ICD-10-CM | POA: Diagnosis not present

## 2015-07-01 DIAGNOSIS — N186 End stage renal disease: Secondary | ICD-10-CM | POA: Diagnosis not present

## 2015-07-03 DIAGNOSIS — N2581 Secondary hyperparathyroidism of renal origin: Secondary | ICD-10-CM | POA: Diagnosis not present

## 2015-07-03 DIAGNOSIS — D631 Anemia in chronic kidney disease: Secondary | ICD-10-CM | POA: Diagnosis not present

## 2015-07-03 DIAGNOSIS — N186 End stage renal disease: Secondary | ICD-10-CM | POA: Diagnosis not present

## 2015-07-05 ENCOUNTER — Other Ambulatory Visit

## 2015-07-06 DIAGNOSIS — N2581 Secondary hyperparathyroidism of renal origin: Secondary | ICD-10-CM | POA: Diagnosis not present

## 2015-07-06 DIAGNOSIS — D631 Anemia in chronic kidney disease: Secondary | ICD-10-CM | POA: Diagnosis not present

## 2015-07-06 DIAGNOSIS — N186 End stage renal disease: Secondary | ICD-10-CM | POA: Diagnosis not present

## 2015-07-07 ENCOUNTER — Ambulatory Visit
Admission: RE | Admit: 2015-07-07 | Discharge: 2015-07-07 | Disposition: A | Discharge status: Home / Self Care | Payer: Medicare Other | Source: Ambulatory Visit | Attending: Internal Medicine | Admitting: Internal Medicine

## 2015-07-07 DIAGNOSIS — N186 End stage renal disease: Secondary | ICD-10-CM | POA: Diagnosis not present

## 2015-07-07 DIAGNOSIS — Z992 Dependence on renal dialysis: Principal | ICD-10-CM

## 2015-07-07 DIAGNOSIS — K753 Granulomatous hepatitis, not elsewhere classified: Secondary | ICD-10-CM | POA: Diagnosis not present

## 2015-07-08 DIAGNOSIS — N2581 Secondary hyperparathyroidism of renal origin: Secondary | ICD-10-CM | POA: Diagnosis not present

## 2015-07-08 DIAGNOSIS — D631 Anemia in chronic kidney disease: Secondary | ICD-10-CM | POA: Diagnosis not present

## 2015-07-08 DIAGNOSIS — N186 End stage renal disease: Secondary | ICD-10-CM | POA: Diagnosis not present

## 2015-07-10 DIAGNOSIS — N2581 Secondary hyperparathyroidism of renal origin: Secondary | ICD-10-CM | POA: Diagnosis not present

## 2015-07-10 DIAGNOSIS — D631 Anemia in chronic kidney disease: Secondary | ICD-10-CM | POA: Diagnosis not present

## 2015-07-10 DIAGNOSIS — N186 End stage renal disease: Secondary | ICD-10-CM | POA: Diagnosis not present

## 2015-07-13 DIAGNOSIS — N186 End stage renal disease: Secondary | ICD-10-CM | POA: Diagnosis not present

## 2015-07-13 DIAGNOSIS — D631 Anemia in chronic kidney disease: Secondary | ICD-10-CM | POA: Diagnosis not present

## 2015-07-13 DIAGNOSIS — N2581 Secondary hyperparathyroidism of renal origin: Secondary | ICD-10-CM | POA: Diagnosis not present

## 2015-07-15 DIAGNOSIS — N2581 Secondary hyperparathyroidism of renal origin: Secondary | ICD-10-CM | POA: Diagnosis not present

## 2015-07-15 DIAGNOSIS — D631 Anemia in chronic kidney disease: Secondary | ICD-10-CM | POA: Diagnosis not present

## 2015-07-15 DIAGNOSIS — N186 End stage renal disease: Secondary | ICD-10-CM | POA: Diagnosis not present

## 2015-07-17 DIAGNOSIS — N186 End stage renal disease: Secondary | ICD-10-CM | POA: Diagnosis not present

## 2015-07-17 DIAGNOSIS — N2581 Secondary hyperparathyroidism of renal origin: Secondary | ICD-10-CM | POA: Diagnosis not present

## 2015-07-17 DIAGNOSIS — D631 Anemia in chronic kidney disease: Secondary | ICD-10-CM | POA: Diagnosis not present

## 2015-07-20 DIAGNOSIS — D631 Anemia in chronic kidney disease: Secondary | ICD-10-CM | POA: Diagnosis not present

## 2015-07-20 DIAGNOSIS — N2581 Secondary hyperparathyroidism of renal origin: Secondary | ICD-10-CM | POA: Diagnosis not present

## 2015-07-20 DIAGNOSIS — N186 End stage renal disease: Secondary | ICD-10-CM | POA: Diagnosis not present

## 2015-07-21 ENCOUNTER — Ambulatory Visit: Payer: Medicare Other | Admitting: Physician Assistant

## 2015-07-22 DIAGNOSIS — N2581 Secondary hyperparathyroidism of renal origin: Secondary | ICD-10-CM | POA: Diagnosis not present

## 2015-07-22 DIAGNOSIS — N186 End stage renal disease: Secondary | ICD-10-CM | POA: Diagnosis not present

## 2015-07-22 DIAGNOSIS — D631 Anemia in chronic kidney disease: Secondary | ICD-10-CM | POA: Diagnosis not present

## 2015-07-24 DIAGNOSIS — D631 Anemia in chronic kidney disease: Secondary | ICD-10-CM | POA: Diagnosis not present

## 2015-07-24 DIAGNOSIS — N2581 Secondary hyperparathyroidism of renal origin: Secondary | ICD-10-CM | POA: Diagnosis not present

## 2015-07-24 DIAGNOSIS — N186 End stage renal disease: Secondary | ICD-10-CM | POA: Diagnosis not present

## 2015-07-26 DIAGNOSIS — M1711 Unilateral primary osteoarthritis, right knee: Secondary | ICD-10-CM | POA: Diagnosis not present

## 2015-07-26 DIAGNOSIS — M25561 Pain in right knee: Secondary | ICD-10-CM | POA: Diagnosis not present

## 2015-07-27 ENCOUNTER — Ambulatory Visit (INDEPENDENT_AMBULATORY_CARE_PROVIDER_SITE_OTHER): Payer: Medicare Other | Admitting: Cardiology

## 2015-07-27 ENCOUNTER — Encounter: Payer: Self-pay | Admitting: Cardiology

## 2015-07-27 VITALS — BP 126/74 | HR 78 | Ht 66.0 in | Wt 147.0 lb

## 2015-07-27 DIAGNOSIS — N186 End stage renal disease: Secondary | ICD-10-CM | POA: Diagnosis not present

## 2015-07-27 DIAGNOSIS — N2581 Secondary hyperparathyroidism of renal origin: Secondary | ICD-10-CM | POA: Diagnosis not present

## 2015-07-27 DIAGNOSIS — Z01818 Encounter for other preprocedural examination: Secondary | ICD-10-CM

## 2015-07-27 DIAGNOSIS — R0989 Other specified symptoms and signs involving the circulatory and respiratory systems: Secondary | ICD-10-CM

## 2015-07-27 DIAGNOSIS — I10 Essential (primary) hypertension: Secondary | ICD-10-CM

## 2015-07-27 DIAGNOSIS — D631 Anemia in chronic kidney disease: Secondary | ICD-10-CM | POA: Diagnosis not present

## 2015-07-27 NOTE — Assessment & Plan Note (Signed)
Continues zetia. Intolerant to statins.

## 2015-07-27 NOTE — Assessment & Plan Note (Signed)
Blood pressure controlled. Continue present medications. 

## 2015-07-27 NOTE — Assessment & Plan Note (Signed)
Plan carotid Dopplers to further assess.

## 2015-07-27 NOTE — Progress Notes (Signed)
HPI: 65 year old female for evaluation of hypertension and preop. Echocardiogram December 2015 showed vigorous LV function, grade 1 diastolic dysfunction, Mild mitral regurgitation, mild left atrial enlargement and small pericardial effusion. Patient has end-stage renal disease and is being considered for transplant. She denies dyspnea on exertion, orthopnea, PND, pedal edema, palpitations, syncope, claudication or exertional chest pain.  Current Outpatient Prescriptions  Medication Sig Dispense Refill  . Cholecalciferol (VITAMIN D3) 50000 UNITS CAPS Take 50,000 Units by mouth. With dialysis    . Cyanocobalamin (VITAMIN B 12 PO) Take 1,000 mcg by mouth daily. With dialysis    . ezetimibe (ZETIA) 10 MG tablet Take 10 mg by mouth daily.    Marland Kitchen glucose blood (PRODIGY NO CODING BLOOD GLUC) test strip 1 each.    . levothyroxine (SYNTHROID, LEVOTHROID) 150 MCG tablet Take 150 mcg by mouth daily before breakfast.    . losartan (COZAAR) 100 MG tablet Take 100 mg by mouth daily. Reported on 06/28/2015    . OVER THE COUNTER MEDICATION Iron 65 mg 1 daily with dialysis    . SENSIPAR 30 MG tablet Take 1 tablet by mouth daily.    . sevelamer carbonate (RENVELA) 800 MG tablet Take 800 mg by mouth 3 (three) times daily with meals.     No current facility-administered medications for this visit.    Allergies  Allergen Reactions  . Hydralazine Shortness Of Breath    Chest pain fatigue  . Azor [Amlodipine-Olmesartan]   . Hydrochlorothiazide W-Triamterene   . Labetalol     Per patient, caused foot pain  . Lisinopril Other (See Comments)    Severe radiating foot pain.  . Metformin Itching    Loss of bowel control, general sick feeling  . Metoclopramide Other (See Comments)  . Other Other (See Comments)    "contractions in throat" Diazide pt states cause throat to swell.  . Minoxidil Rash    Choking, neck contractions fatigue  . Spironolactone Other (See Comments) and Rash    Severe radiating  pain in feet Severe radiating pain in feet.  . Statins Rash    Fatigue Cause fatigue     Past Medical History  Diagnosis Date  . Diabetes mellitus without complication ([place])   . Hypertension   . Hyperlipidemia   . Hashimoto's disease   . Hypothyroid   . ESRD (end stage renal disease) ([place])   . Diabetic retinopathy [facility])     Past Surgical History  Procedure Laterality Date  . Eye surgery    . Abdominal hysterectomy    . Knee surgery      Social History   Social History  . Marital Status: Married    Spouse Name: N/A  . Number of Children: 6  . Years of Education: N/A   Occupational History  . Not on file.   Social History Main Topics  . Smoking status: Never Smoker   . Smokeless tobacco: Not on file  . Alcohol Use: No  . Drug Use: Not on file  . Sexual Activity: Not on file   Other Topics Concern  . Not on file   Social History Narrative    Family History  Problem Relation Age of Onset  . CAD      No family history    ROS: Knee arthralgias but no fevers or chills, productive cough, hemoptysis, dysphasia, odynophagia, melena, hematochezia, dysuria, hematuria, rash, seizure activity, orthopnea, PND, pedal edema, claudication. Remaining systems are negative.  Physical Exam:   Blood pressure 126/74,  pulse 78, height 5\' 6"  (1.676 m), weight 147 lb (66.679 kg).  General:  Well developed/well nourished in NAD Skin warm/dry Patient not depressed No peripheral clubbing Back-normal HEENT-normal/normal eyelids Neck supple/normal carotid upstroke bilaterally; Left carotid bruit; no JVD; no thyromegaly chest - CTA/ normal expansion CV - RRR/normal S1 and S2; no rubs or gallops;  PMI nondisplaced; 1/6 systolic ejection murmur Abdomen -NT/ND, no HSM, no mass, + bowel sounds, no bruit 2+ femoral pulses, no bruits Ext-no edema, chords, 1+ DP Neuro-grossly nonfocal  ECG Sinus rhythm at a rate of 78. No ST changes.

## 2015-07-27 NOTE — Assessment & Plan Note (Signed)
Patient is being evaluated prior to renal transplant.Plan stress nuclear study for risk stratification and echocardiogram to assess LV function.

## 2015-07-27 NOTE — Patient Instructions (Addendum)
Medication Instructions:   NO CHANGE  Testing/Procedures:  Your physician has requested that you have an echocardiogram. Echocardiography is a painless test that uses sound waves to create images of your heart. It provides your doctor with information about the size and shape of your heart and how well your heart's chambers and valves are working. This procedure takes approximately one hour. There are no restrictions for this procedure.   Your physician has requested that you have en exercise stress myoview. For further information please visit [email or web address]. Please follow instruction sheet, as given.     Your physician has requested that you have a carotid duplex. This test is an ultrasound of the carotid arteries in your neck. It looks at blood flow through these arteries that supply the brain with blood. Allow one hour for this exam. There are no restrictions or special instructions.    Follow-Up:  Your physician recommends that you schedule a follow-up appointment in: AS NEEDED PENDING TEST RESULTS

## 2015-07-29 DIAGNOSIS — D631 Anemia in chronic kidney disease: Secondary | ICD-10-CM | POA: Diagnosis not present

## 2015-07-29 DIAGNOSIS — N186 End stage renal disease: Secondary | ICD-10-CM | POA: Diagnosis not present

## 2015-07-29 DIAGNOSIS — N2581 Secondary hyperparathyroidism of renal origin: Secondary | ICD-10-CM | POA: Diagnosis not present

## 2015-07-30 DIAGNOSIS — N186 End stage renal disease: Secondary | ICD-10-CM | POA: Diagnosis not present

## 2015-07-30 DIAGNOSIS — E1022 Type 1 diabetes mellitus with diabetic chronic kidney disease: Secondary | ICD-10-CM | POA: Diagnosis not present

## 2015-07-30 DIAGNOSIS — Z992 Dependence on renal dialysis: Secondary | ICD-10-CM | POA: Diagnosis not present

## 2015-07-31 DIAGNOSIS — D631 Anemia in chronic kidney disease: Secondary | ICD-10-CM | POA: Diagnosis not present

## 2015-07-31 DIAGNOSIS — N186 End stage renal disease: Secondary | ICD-10-CM | POA: Diagnosis not present

## 2015-07-31 DIAGNOSIS — N2581 Secondary hyperparathyroidism of renal origin: Secondary | ICD-10-CM | POA: Diagnosis not present

## 2015-08-03 DIAGNOSIS — N186 End stage renal disease: Secondary | ICD-10-CM | POA: Diagnosis not present

## 2015-08-03 DIAGNOSIS — N2581 Secondary hyperparathyroidism of renal origin: Secondary | ICD-10-CM | POA: Diagnosis not present

## 2015-08-03 DIAGNOSIS — D631 Anemia in chronic kidney disease: Secondary | ICD-10-CM | POA: Diagnosis not present

## 2015-08-04 ENCOUNTER — Telehealth (HOSPITAL_COMMUNITY): Payer: Self-pay

## 2015-08-04 NOTE — Telephone Encounter (Signed)
Encounter complete. 

## 2015-08-05 DIAGNOSIS — N2581 Secondary hyperparathyroidism of renal origin: Secondary | ICD-10-CM | POA: Diagnosis not present

## 2015-08-05 DIAGNOSIS — N186 End stage renal disease: Secondary | ICD-10-CM | POA: Diagnosis not present

## 2015-08-05 DIAGNOSIS — D631 Anemia in chronic kidney disease: Secondary | ICD-10-CM | POA: Diagnosis not present

## 2015-08-06 ENCOUNTER — Ambulatory Visit (HOSPITAL_COMMUNITY)
Admission: RE | Admit: 2015-08-06 | Discharge: 2015-08-06 | Disposition: A | Discharge status: Home / Self Care | Payer: Medicare Other | Source: Ambulatory Visit | Attending: Internal Medicine | Admitting: Internal Medicine

## 2015-08-06 DIAGNOSIS — E1122 Type 2 diabetes mellitus with diabetic chronic kidney disease: Secondary | ICD-10-CM | POA: Diagnosis not present

## 2015-08-06 DIAGNOSIS — R0989 Other specified symptoms and signs involving the circulatory and respiratory systems: Secondary | ICD-10-CM

## 2015-08-06 DIAGNOSIS — H548 Legal blindness, as defined in USA: Secondary | ICD-10-CM | POA: Diagnosis not present

## 2015-08-06 DIAGNOSIS — N186 End stage renal disease: Secondary | ICD-10-CM | POA: Diagnosis not present

## 2015-08-06 DIAGNOSIS — E11319 Type 2 diabetes mellitus with unspecified diabetic retinopathy without macular edema: Secondary | ICD-10-CM | POA: Diagnosis not present

## 2015-08-06 DIAGNOSIS — Z01818 Encounter for other preprocedural examination: Secondary | ICD-10-CM | POA: Insufficient documentation

## 2015-08-06 DIAGNOSIS — E785 Hyperlipidemia, unspecified: Secondary | ICD-10-CM | POA: Insufficient documentation

## 2015-08-06 DIAGNOSIS — I10 Essential (primary) hypertension: Secondary | ICD-10-CM

## 2015-08-06 DIAGNOSIS — I120 Hypertensive chronic kidney disease with stage 5 chronic kidney disease or end stage renal disease: Secondary | ICD-10-CM | POA: Diagnosis not present

## 2015-08-06 DIAGNOSIS — I6523 Occlusion and stenosis of bilateral carotid arteries: Secondary | ICD-10-CM | POA: Insufficient documentation

## 2015-08-06 LAB — MYOCARDIAL PERFUSION IMAGING
CHL CUP NUCLEAR SDS: 1
CHL CUP NUCLEAR SRS: 0
CHL CUP RESTING HR STRESS: 75 {beats}/min
LV dias vol: 57 mL (ref 46–106)
LV sys vol: 29 mL
NUC STRESS TID: 0.96
Peak HR: 100 {beats}/min
SSS: 1

## 2015-08-06 MED ORDER — TECHNETIUM TC 99M SESTAMIBI GENERIC - CARDIOLITE
29.2000 | Freq: Once | INTRAVENOUS | Status: AC | PRN
  Administered 2015-08-06: 29.2 via INTRAVENOUS

## 2015-08-06 MED ORDER — REGADENOSON 0.4 MG/5ML IV SOLN
0.4000 mg | Freq: Once | INTRAVENOUS | Status: AC
Start: 2015-08-06 — End: 2015-08-06
  Administered 2015-08-06: 0.4 mg via INTRAVENOUS

## 2015-08-06 MED ORDER — TECHNETIUM TC 99M SESTAMIBI GENERIC - CARDIOLITE
9.3000 | Freq: Once | INTRAVENOUS | Status: AC | PRN
  Administered 2015-08-06: 9.3 via INTRAVENOUS

## 2015-08-06 MED ORDER — AMINOPHYLLINE 25 MG/ML IV SOLN
75.0000 mg | Freq: Once | INTRAVENOUS | Status: AC
  Administered 2015-08-06: 75 mg via INTRAVENOUS

## 2015-08-07 DIAGNOSIS — D631 Anemia in chronic kidney disease: Secondary | ICD-10-CM | POA: Diagnosis not present

## 2015-08-07 DIAGNOSIS — N2581 Secondary hyperparathyroidism of renal origin: Secondary | ICD-10-CM | POA: Diagnosis not present

## 2015-08-07 DIAGNOSIS — N186 End stage renal disease: Secondary | ICD-10-CM | POA: Diagnosis not present

## 2015-08-09 ENCOUNTER — Encounter: Payer: Self-pay | Admitting: *Deleted

## 2015-08-09 ENCOUNTER — Ambulatory Visit (AMBULATORY_SURGERY_CENTER): Payer: Self-pay | Admitting: *Deleted

## 2015-08-09 VITALS — Ht 65.0 in | Wt 142.0 lb

## 2015-08-09 DIAGNOSIS — Z1211 Encounter for screening for malignant neoplasm of colon: Secondary | ICD-10-CM

## 2015-08-09 DIAGNOSIS — R0989 Other specified symptoms and signs involving the circulatory and respiratory systems: Secondary | ICD-10-CM | POA: Diagnosis not present

## 2015-08-09 NOTE — Progress Notes (Signed)
No egg or soy allergy known to patient  No issues with past sedation with any surgeries  or procedures, no intubation problems  No diet pills per patient No home 02 use per patient  No blood thinners per patient  Pt denies issues with constipation  Pt has visual loss in both eyes, she has dialysis fistula left upper arm- no BP sticks left arm Pt states she has no fluid restrictions due to her dialysis so prep should be fine for her

## 2015-08-10 DIAGNOSIS — N186 End stage renal disease: Secondary | ICD-10-CM | POA: Diagnosis not present

## 2015-08-10 DIAGNOSIS — N2581 Secondary hyperparathyroidism of renal origin: Secondary | ICD-10-CM | POA: Diagnosis not present

## 2015-08-10 DIAGNOSIS — D631 Anemia in chronic kidney disease: Secondary | ICD-10-CM | POA: Diagnosis not present

## 2015-08-12 DIAGNOSIS — N186 End stage renal disease: Secondary | ICD-10-CM | POA: Diagnosis not present

## 2015-08-12 DIAGNOSIS — D631 Anemia in chronic kidney disease: Secondary | ICD-10-CM | POA: Diagnosis not present

## 2015-08-12 DIAGNOSIS — N2581 Secondary hyperparathyroidism of renal origin: Secondary | ICD-10-CM | POA: Diagnosis not present

## 2015-08-13 ENCOUNTER — Other Ambulatory Visit (HOSPITAL_COMMUNITY): Payer: Medicare Other

## 2015-08-14 DIAGNOSIS — D631 Anemia in chronic kidney disease: Secondary | ICD-10-CM | POA: Diagnosis not present

## 2015-08-14 DIAGNOSIS — N2581 Secondary hyperparathyroidism of renal origin: Secondary | ICD-10-CM | POA: Diagnosis not present

## 2015-08-14 DIAGNOSIS — N186 End stage renal disease: Secondary | ICD-10-CM | POA: Diagnosis not present

## 2015-08-16 ENCOUNTER — Ambulatory Visit: Admitting: Cardiology

## 2015-08-17 DIAGNOSIS — D631 Anemia in chronic kidney disease: Secondary | ICD-10-CM | POA: Diagnosis not present

## 2015-08-17 DIAGNOSIS — N2581 Secondary hyperparathyroidism of renal origin: Secondary | ICD-10-CM | POA: Diagnosis not present

## 2015-08-17 DIAGNOSIS — N186 End stage renal disease: Secondary | ICD-10-CM | POA: Diagnosis not present

## 2015-08-19 DIAGNOSIS — N2581 Secondary hyperparathyroidism of renal origin: Secondary | ICD-10-CM | POA: Diagnosis not present

## 2015-08-19 DIAGNOSIS — N186 End stage renal disease: Secondary | ICD-10-CM | POA: Diagnosis not present

## 2015-08-19 DIAGNOSIS — D631 Anemia in chronic kidney disease: Secondary | ICD-10-CM | POA: Diagnosis not present

## 2015-08-21 DIAGNOSIS — N186 End stage renal disease: Secondary | ICD-10-CM | POA: Diagnosis not present

## 2015-08-21 DIAGNOSIS — D631 Anemia in chronic kidney disease: Secondary | ICD-10-CM | POA: Diagnosis not present

## 2015-08-21 DIAGNOSIS — N2581 Secondary hyperparathyroidism of renal origin: Secondary | ICD-10-CM | POA: Diagnosis not present

## 2015-08-23 ENCOUNTER — Ambulatory Visit (AMBULATORY_SURGERY_CENTER): Payer: Medicare Other | Admitting: Gastroenterology

## 2015-08-23 ENCOUNTER — Encounter: Payer: Self-pay | Admitting: Gastroenterology

## 2015-08-23 VITALS — BP 132/57 | HR 73 | Temp 97.7°F | Resp 12 | Ht 65.0 in | Wt 142.0 lb

## 2015-08-23 DIAGNOSIS — Z1211 Encounter for screening for malignant neoplasm of colon: Secondary | ICD-10-CM

## 2015-08-23 DIAGNOSIS — N186 End stage renal disease: Secondary | ICD-10-CM | POA: Diagnosis not present

## 2015-08-23 MED ORDER — SODIUM CHLORIDE 0.9 % IV SOLN: 500.0000 mL | INTRAVENOUS | Status: DC

## 2015-08-23 NOTE — Patient Instructions (Signed)

## 2015-08-23 NOTE — Op Note (Signed)
[place] Patient Name: Denise Bush Procedure Date: 08/23/2015 7:27 AM MRN: [id number] Endoscopist: Mallie Mussel L. Loletha Carrow , MD Age: 65 Date of Birth: 1951/02/25 Gender: Female Procedure:                Colonoscopy Indications:              Screening for colorectal malignant neoplasm Medicines:                Monitored Anesthesia Care Procedure:                Pre-Anesthesia Assessment:                           - Prior to the procedure, a History and Physical                            was performed, and patient medications and                            allergies were reviewed. The patient's tolerance of                            previous anesthesia was also reviewed. The risks                            and benefits of the procedure and the sedation                            options and risks were discussed with the patient.                            All questions were answered, and informed consent                            was obtained. Prior Anticoagulants: The patient has                            taken no previous anticoagulant or antiplatelet                            agents. ASA Grade Assessment: III - A patient with                            severe systemic disease. After reviewing the risks                            and benefits, the patient was deemed in                            satisfactory condition to undergo the procedure.                           After obtaining informed consent, the colonoscope  was passed under direct vision. Throughout the                            procedure, the patient's blood pressure, pulse, and                            oxygen saturations were monitored continuously. The                            Model CF-HQ190L [id number]) scope was introduced                            through the anus and advanced to the the cecum,                            identified by appendiceal orifice and ileocecal                        valve. The colonoscopy was somewhat difficult due                            to a tortuous colon. The patient tolerated the                            procedure well. The quality of the bowel                            preparation was good. The ileocecal valve,                            appendiceal orifice, and rectum were photographed.                            The quality of the bowel preparation was evaluated                            using the BBPS [facility] Bowel Preparation Scale)                            with scores of: Right Colon = 2, Transverse Colon =                            2 and Left Colon = 2. The total BBPS score equals                            6. The bowel preparation used was Miralax. Scope In: 8:08:09 AM Scope Out: 8:23:43 AM Scope Withdrawal Time: 0 hours 8 minutes 32 seconds  Total Procedure Duration: 0 hours 15 minutes 34 seconds  Findings:                 The perianal and digital rectal examinations were                            normal.  The entire examined colon appeared normal on direct                            and retroflexion views. Complications:            No immediate complications. Estimated Blood Loss:     Estimated blood loss: none. Impression:               - The entire examined colon is normal on direct and                            retroflexion views.                           - No specimens collected. Recommendation:           - Patient has a contact number available for                            emergencies. The signs and symptoms of potential                            delayed complications were discussed with the                            patient. Return to normal activities tomorrow.                            Written discharge instructions were provided to the                            patient.                           - Resume previous diet.                           - Continue  present medications.                           - Repeat colonoscopy in 10 years for screening                            purposes. Chrislynn Mosely L. Loletha Carrow, MD 08/23/2015 8:27:16 AM This report has been signed electronically.

## 2015-08-23 NOTE — Progress Notes (Signed)
A/ox3 pleased with MAC, report to Jane RN 

## 2015-08-24 ENCOUNTER — Telehealth: Payer: Self-pay | Admitting: *Deleted

## 2015-08-24 DIAGNOSIS — D631 Anemia in chronic kidney disease: Secondary | ICD-10-CM | POA: Diagnosis not present

## 2015-08-24 DIAGNOSIS — N2581 Secondary hyperparathyroidism of renal origin: Secondary | ICD-10-CM | POA: Diagnosis not present

## 2015-08-24 DIAGNOSIS — N186 End stage renal disease: Secondary | ICD-10-CM | POA: Diagnosis not present

## 2015-08-24 NOTE — Telephone Encounter (Signed)
  Follow up Call-  Call back number 08/23/2015  Post procedure Call Back phone  # [phone]  Permission to leave phone message Yes     Patient questions:  Do you have a fever, pain , or abdominal swelling? No. Pain Score  0 *  Have you tolerated food without any problems? Yes.    Have you been able to return to your normal activities? Yes.    Do you have any questions about your discharge instructions: Diet   No. Medications  No. Follow up visit  No.  Do you have questions or concerns about your Care? No.  Actions: * If pain score is 4 or above: No action needed, pain <4.

## 2015-08-26 DIAGNOSIS — D631 Anemia in chronic kidney disease: Secondary | ICD-10-CM | POA: Diagnosis not present

## 2015-08-26 DIAGNOSIS — N186 End stage renal disease: Secondary | ICD-10-CM | POA: Diagnosis not present

## 2015-08-26 DIAGNOSIS — E11319 Type 2 diabetes mellitus with unspecified diabetic retinopathy without macular edema: Secondary | ICD-10-CM | POA: Diagnosis not present

## 2015-08-26 DIAGNOSIS — N2581 Secondary hyperparathyroidism of renal origin: Secondary | ICD-10-CM | POA: Diagnosis not present

## 2015-08-28 DIAGNOSIS — N186 End stage renal disease: Secondary | ICD-10-CM | POA: Diagnosis not present

## 2015-08-28 DIAGNOSIS — N2581 Secondary hyperparathyroidism of renal origin: Secondary | ICD-10-CM | POA: Diagnosis not present

## 2015-08-28 DIAGNOSIS — D631 Anemia in chronic kidney disease: Secondary | ICD-10-CM | POA: Diagnosis not present

## 2015-08-29 DIAGNOSIS — Z992 Dependence on renal dialysis: Secondary | ICD-10-CM | POA: Diagnosis not present

## 2015-08-29 DIAGNOSIS — N186 End stage renal disease: Secondary | ICD-10-CM | POA: Diagnosis not present

## 2015-08-29 DIAGNOSIS — E1022 Type 1 diabetes mellitus with diabetic chronic kidney disease: Secondary | ICD-10-CM | POA: Diagnosis not present

## 2015-08-30 DIAGNOSIS — Z992 Dependence on renal dialysis: Secondary | ICD-10-CM | POA: Diagnosis not present

## 2015-08-30 DIAGNOSIS — N186 End stage renal disease: Secondary | ICD-10-CM | POA: Diagnosis not present

## 2015-08-31 DIAGNOSIS — N186 End stage renal disease: Secondary | ICD-10-CM | POA: Diagnosis not present

## 2015-08-31 DIAGNOSIS — N2581 Secondary hyperparathyroidism of renal origin: Secondary | ICD-10-CM | POA: Diagnosis not present

## 2015-08-31 DIAGNOSIS — D631 Anemia in chronic kidney disease: Secondary | ICD-10-CM | POA: Diagnosis not present

## 2015-08-31 DIAGNOSIS — D509 Iron deficiency anemia, unspecified: Secondary | ICD-10-CM | POA: Diagnosis not present

## 2015-09-01 DIAGNOSIS — H3343 Traction detachment of retina, bilateral: Secondary | ICD-10-CM | POA: Diagnosis not present

## 2015-09-01 DIAGNOSIS — H35371 Puckering of macula, right eye: Secondary | ICD-10-CM | POA: Diagnosis not present

## 2015-09-02 DIAGNOSIS — N2581 Secondary hyperparathyroidism of renal origin: Secondary | ICD-10-CM | POA: Diagnosis not present

## 2015-09-02 DIAGNOSIS — D509 Iron deficiency anemia, unspecified: Secondary | ICD-10-CM | POA: Diagnosis not present

## 2015-09-02 DIAGNOSIS — N186 End stage renal disease: Secondary | ICD-10-CM | POA: Diagnosis not present

## 2015-09-02 DIAGNOSIS — D631 Anemia in chronic kidney disease: Secondary | ICD-10-CM | POA: Diagnosis not present

## 2015-09-04 DIAGNOSIS — D509 Iron deficiency anemia, unspecified: Secondary | ICD-10-CM | POA: Diagnosis not present

## 2015-09-04 DIAGNOSIS — D631 Anemia in chronic kidney disease: Secondary | ICD-10-CM | POA: Diagnosis not present

## 2015-09-04 DIAGNOSIS — N2581 Secondary hyperparathyroidism of renal origin: Secondary | ICD-10-CM | POA: Diagnosis not present

## 2015-09-04 DIAGNOSIS — N186 End stage renal disease: Secondary | ICD-10-CM | POA: Diagnosis not present

## 2015-09-07 DIAGNOSIS — N2581 Secondary hyperparathyroidism of renal origin: Secondary | ICD-10-CM | POA: Diagnosis not present

## 2015-09-07 DIAGNOSIS — N186 End stage renal disease: Secondary | ICD-10-CM | POA: Diagnosis not present

## 2015-09-07 DIAGNOSIS — D631 Anemia in chronic kidney disease: Secondary | ICD-10-CM | POA: Diagnosis not present

## 2015-09-07 DIAGNOSIS — D509 Iron deficiency anemia, unspecified: Secondary | ICD-10-CM | POA: Diagnosis not present

## 2015-09-09 DIAGNOSIS — D509 Iron deficiency anemia, unspecified: Secondary | ICD-10-CM | POA: Diagnosis not present

## 2015-09-09 DIAGNOSIS — N186 End stage renal disease: Secondary | ICD-10-CM | POA: Diagnosis not present

## 2015-09-09 DIAGNOSIS — D631 Anemia in chronic kidney disease: Secondary | ICD-10-CM | POA: Diagnosis not present

## 2015-09-09 DIAGNOSIS — N2581 Secondary hyperparathyroidism of renal origin: Secondary | ICD-10-CM | POA: Diagnosis not present

## 2015-09-11 DIAGNOSIS — N2581 Secondary hyperparathyroidism of renal origin: Secondary | ICD-10-CM | POA: Diagnosis not present

## 2015-09-11 DIAGNOSIS — N186 End stage renal disease: Secondary | ICD-10-CM | POA: Diagnosis not present

## 2015-09-15 ENCOUNTER — Other Ambulatory Visit (HOSPITAL_COMMUNITY): Payer: Medicare Other

## 2015-09-16 DIAGNOSIS — N2581 Secondary hyperparathyroidism of renal origin: Secondary | ICD-10-CM | POA: Diagnosis not present

## 2015-09-16 DIAGNOSIS — D509 Iron deficiency anemia, unspecified: Secondary | ICD-10-CM | POA: Diagnosis not present

## 2015-09-16 DIAGNOSIS — N186 End stage renal disease: Secondary | ICD-10-CM | POA: Diagnosis not present

## 2015-09-16 DIAGNOSIS — D631 Anemia in chronic kidney disease: Secondary | ICD-10-CM | POA: Diagnosis not present

## 2015-09-18 DIAGNOSIS — N186 End stage renal disease: Secondary | ICD-10-CM | POA: Diagnosis not present

## 2015-09-18 DIAGNOSIS — D509 Iron deficiency anemia, unspecified: Secondary | ICD-10-CM | POA: Diagnosis not present

## 2015-09-18 DIAGNOSIS — N2581 Secondary hyperparathyroidism of renal origin: Secondary | ICD-10-CM | POA: Diagnosis not present

## 2015-09-18 DIAGNOSIS — D631 Anemia in chronic kidney disease: Secondary | ICD-10-CM | POA: Diagnosis not present

## 2015-09-20 ENCOUNTER — Ambulatory Visit (HOSPITAL_COMMUNITY): Payer: Medicare Other | Attending: Cardiology

## 2015-09-20 ENCOUNTER — Other Ambulatory Visit: Payer: Self-pay

## 2015-09-20 DIAGNOSIS — E785 Hyperlipidemia, unspecified: Secondary | ICD-10-CM | POA: Diagnosis not present

## 2015-09-20 DIAGNOSIS — N186 End stage renal disease: Secondary | ICD-10-CM | POA: Diagnosis not present

## 2015-09-20 DIAGNOSIS — E1122 Type 2 diabetes mellitus with diabetic chronic kidney disease: Secondary | ICD-10-CM | POA: Insufficient documentation

## 2015-09-20 DIAGNOSIS — R0600 Dyspnea, unspecified: Secondary | ICD-10-CM | POA: Insufficient documentation

## 2015-09-20 DIAGNOSIS — I120 Hypertensive chronic kidney disease with stage 5 chronic kidney disease or end stage renal disease: Secondary | ICD-10-CM | POA: Insufficient documentation

## 2015-09-20 DIAGNOSIS — I10 Essential (primary) hypertension: Secondary | ICD-10-CM

## 2015-09-20 DIAGNOSIS — Z01818 Encounter for other preprocedural examination: Secondary | ICD-10-CM | POA: Diagnosis not present

## 2015-09-21 DIAGNOSIS — N2581 Secondary hyperparathyroidism of renal origin: Secondary | ICD-10-CM | POA: Diagnosis not present

## 2015-09-21 DIAGNOSIS — D509 Iron deficiency anemia, unspecified: Secondary | ICD-10-CM | POA: Diagnosis not present

## 2015-09-21 DIAGNOSIS — N186 End stage renal disease: Secondary | ICD-10-CM | POA: Diagnosis not present

## 2015-09-21 DIAGNOSIS — D631 Anemia in chronic kidney disease: Secondary | ICD-10-CM | POA: Diagnosis not present

## 2015-09-22 ENCOUNTER — Ambulatory Visit: Admitting: Podiatry

## 2015-09-23 DIAGNOSIS — N186 End stage renal disease: Secondary | ICD-10-CM | POA: Diagnosis not present

## 2015-09-23 DIAGNOSIS — N2581 Secondary hyperparathyroidism of renal origin: Secondary | ICD-10-CM | POA: Diagnosis not present

## 2015-09-23 DIAGNOSIS — D631 Anemia in chronic kidney disease: Secondary | ICD-10-CM | POA: Diagnosis not present

## 2015-09-23 DIAGNOSIS — D509 Iron deficiency anemia, unspecified: Secondary | ICD-10-CM | POA: Diagnosis not present

## 2015-09-25 DIAGNOSIS — N186 End stage renal disease: Secondary | ICD-10-CM | POA: Diagnosis not present

## 2015-09-25 DIAGNOSIS — D631 Anemia in chronic kidney disease: Secondary | ICD-10-CM | POA: Diagnosis not present

## 2015-09-25 DIAGNOSIS — D509 Iron deficiency anemia, unspecified: Secondary | ICD-10-CM | POA: Diagnosis not present

## 2015-09-25 DIAGNOSIS — N2581 Secondary hyperparathyroidism of renal origin: Secondary | ICD-10-CM | POA: Diagnosis not present

## 2015-09-28 DIAGNOSIS — D631 Anemia in chronic kidney disease: Secondary | ICD-10-CM | POA: Diagnosis not present

## 2015-09-28 DIAGNOSIS — D509 Iron deficiency anemia, unspecified: Secondary | ICD-10-CM | POA: Diagnosis not present

## 2015-09-28 DIAGNOSIS — N2581 Secondary hyperparathyroidism of renal origin: Secondary | ICD-10-CM | POA: Diagnosis not present

## 2015-09-28 DIAGNOSIS — N186 End stage renal disease: Secondary | ICD-10-CM | POA: Diagnosis not present

## 2015-09-29 ENCOUNTER — Encounter: Payer: Self-pay | Admitting: Internal Medicine

## 2015-09-29 ENCOUNTER — Ambulatory Visit (INDEPENDENT_AMBULATORY_CARE_PROVIDER_SITE_OTHER): Payer: Medicare Other | Admitting: Internal Medicine

## 2015-09-29 VITALS — BP 120/58 | HR 84 | Temp 97.2°F | Resp 16 | Ht 66.0 in | Wt 141.0 lb

## 2015-09-29 DIAGNOSIS — E782 Mixed hyperlipidemia: Secondary | ICD-10-CM | POA: Diagnosis not present

## 2015-09-29 DIAGNOSIS — N186 End stage renal disease: Secondary | ICD-10-CM | POA: Diagnosis not present

## 2015-09-29 DIAGNOSIS — I10 Essential (primary) hypertension: Secondary | ICD-10-CM

## 2015-09-29 DIAGNOSIS — E1129 Type 2 diabetes mellitus with other diabetic kidney complication: Secondary | ICD-10-CM | POA: Diagnosis not present

## 2015-09-29 DIAGNOSIS — E559 Vitamin D deficiency, unspecified: Secondary | ICD-10-CM

## 2015-09-29 DIAGNOSIS — E1122 Type 2 diabetes mellitus with diabetic chronic kidney disease: Secondary | ICD-10-CM

## 2015-09-29 DIAGNOSIS — Z992 Dependence on renal dialysis: Secondary | ICD-10-CM

## 2015-09-29 DIAGNOSIS — Z79899 Other long term (current) drug therapy: Secondary | ICD-10-CM | POA: Diagnosis not present

## 2015-09-29 DIAGNOSIS — E039 Hypothyroidism, unspecified: Secondary | ICD-10-CM | POA: Diagnosis not present

## 2015-09-29 DIAGNOSIS — E1022 Type 1 diabetes mellitus with diabetic chronic kidney disease: Secondary | ICD-10-CM | POA: Diagnosis not present

## 2015-09-29 LAB — CBC WITH DIFFERENTIAL/PLATELET
BASOS ABS: 64 {cells}/uL (ref 0–200)
BASOS PCT: 1 %
EOS ABS: 64 {cells}/uL (ref 15–500)
Eosinophils Relative: 1 %
HEMATOCRIT: 40.3 % (ref 35.0–45.0)
Hemoglobin: 12.9 g/dL (ref 11.7–15.5)
LYMPHS PCT: 41 %
Lymphs Abs: 2624 cells/uL (ref 850–3900)
MCH: 30.9 pg (ref 27.0–33.0)
MCHC: 32 g/dL (ref 32.0–36.0)
MCV: 96.4 fL (ref 80.0–100.0)
MONO ABS: 384 {cells}/uL (ref 200–950)
MPV: 11.9 fL (ref 7.5–12.5)
Monocytes Relative: 6 %
NEUTROS PCT: 51 %
Neutro Abs: 3264 cells/uL (ref 1500–7800)
Platelets: 268 10*3/uL (ref 140–400)
RBC: 4.18 MIL/uL (ref 3.80–5.10)
RDW: 16.6 % — AB (ref 11.0–15.0)
WBC: 6.4 10*3/uL (ref 3.8–10.8)

## 2015-09-29 LAB — TSH: TSH: 0.24 m[IU]/L — AB

## 2015-09-29 LAB — HEMOGLOBIN A1C
Hgb A1c MFr Bld: 6.4 % — ABNORMAL HIGH (ref <5.7)
MEAN PLASMA GLUCOSE: 137 mg/dL

## 2015-09-29 NOTE — Patient Instructions (Signed)

## 2015-09-29 NOTE — Progress Notes (Signed)
Patient ID: Denise Bush, female   DOB: Oct 17, 1950, 65 y.o.   MRN: [id number]  [facility]                       Unk Pinto, M.D.        Uvaldo Bristle. Silverio Lay, P.A.-C       Starlyn Skeans, P.A.-C   [facility]                [address] [place], [place] [id number] Telephone [phone] Telefax [phone] _________________________________________________________________________      This very nice 65 y.o. [name] presents for 3 month follow up with Hypertension, Hyperlipidemia, Pre-Diabetes and Vitamin D Deficiency.       Patient is treated for HTN & BP has been controlled at home. Today's BP is 120/58. Patient has had no complaints of any cardiac type chest pain, palpitations, dyspnea/orthopnea/PND, dizziness, claudication, or dependent edema.      Hyperlipidemia is controlled with diet & meds. Patient denies myalgias or other med SE's. Last Lipids were 06/28/2015: Cholesterol 177; HDL 47; LDL Cholesterol 68; Triglycerides 308 on       Also, the patient has history of T2_NIDDM (circa 1988) w/ESRD and has been able to taper and d/c all of her Diabetic meds including insulin as her kidney functions failed and she ended on Dialysis starting Nov 1st, 2016.  Her Nephrologists are Dr's Justin Mend. She denies any sx's symptoms of reactive hypoglycemia, diabetic polys, paresthesias or visual blurring.  Last A1c was 7.3% on 06/28/2015.      Further, the patient also has history of Vitamin D Deficiency and supplements vitamin D without any suspected side-effects. Last vitamin D was  56 on 03/22/2015.    Medication Sig  . VITAMIN D 50,000 UNITS CAPS Take 50,000 Units by mouth. With dialysis  . VITAMIN B 12 tab Take 1,000 mcg by mouth daily. With dialysis  . Ezetimibe  10 MG tablet Take 10 mg by mouth daily.  Marland Kitchen levothyroxine 150 MCG tablet Take 150 mcg by mouth daily before breakfast.  . Iron 65 mg Take 1 daily with  dialysis  . SENSIPAR 30 MG  Take 1 tab daily.  . sevelamer carbonate  800 MG tab Take  3  times daily with meals.  Marland Kitchen losartan 100 MG tablet Take 100 mg by mouth daily. Reported on 06/28/2015   Allergies  Allergen Reactions  . Hydralazine Shortness Of Breath    Chest pain fatigue  . Azor [Amlodipine-Olmesartan] Other (See Comments)    Sharp chest pain  Sharp chest pain  Chest pain  . Hydrochlorothiazide W-Triamterene   . Labetalol     Per patient, caused foot pain  . Lisinopril Other (See Comments)    Severe radiating foot pain.  . Metformin Itching    Loss of bowel control, general sick feeling  . Metoclopramide Other (See Comments)  . Other Other (See Comments)    "contractions in throat" Diazide pt states cause throat to swell.  . Minoxidil Rash    Choking, neck contractions fatigue  . Spironolactone Other (See Comments) and Rash    Severe radiating pain in feet Severe radiating pain in feet.  . Statins Rash    Fatigue Cause fatigue   PMHx:   Past Medical History  Diagnosis Date  . Diabetes mellitus without complication ([place])   .  Hypertension   . Hyperlipidemia   . Hashimoto's disease   . Hypothyroid   . ESRD (end stage renal disease) ([place])   . Diabetic retinopathy ([place])   . Allergy   . Blood transfusion without reported diagnosis   . Cataract     bilateral  . Loss of vision     both eyes, no vision left eye and little in left    Immunization History  Administered Date(s) Administered  . Influenza Whole 04/23/2007   Past Surgical History  Procedure Laterality Date  . Eye surgery    . Abdominal hysterectomy    . Knee surgery    . Colonoscopy      FHx:    Reviewed / unchanged  SHx:    Reviewed / unchanged  Systems Review:  Constitutional: Denies fever, chills, wt changes, headaches, insomnia, fatigue, night sweats, change in appetite. Eyes: Denies redness, blurred vision, diplopia, discharge, itchy, watery eyes.  ENT: Denies discharge, congestion,  post nasal drip, epistaxis, sore throat, earache, hearing loss, dental pain, tinnitus, vertigo, sinus pain, snoring.  CV: Denies chest pain, palpitations, irregular heartbeat, syncope, dyspnea, diaphoresis, orthopnea, PND, claudication or edema. Respiratory: denies cough, dyspnea, DOE, pleurisy, hoarseness, laryngitis, wheezing.  Gastrointestinal: Denies dysphagia, odynophagia, heartburn, reflux, water brash, abdominal pain or cramps, nausea, vomiting, bloating, diarrhea, constipation, hematemesis, melena, hematochezia  or hemorrhoids. Genitourinary: Denies dysuria, frequency, urgency, nocturia, hesitancy, discharge, hematuria or flank pain. Musculoskeletal: Denies arthralgias, myalgias, stiffness, jt. swelling, pain, limping or strain/sprain.  Skin: Denies pruritus, rash, hives, warts, acne, eczema or change in skin lesion(s). Neuro: No weakness, tremor, incoordination, spasms, paresthesia or pain. Psychiatric: Denies confusion, memory loss or sensory loss. Endo: Denies change in weight, skin or hair change.  Heme/Lymph: No excessive bleeding, bruising or enlarged lymph nodes.  Physical Exam  BP 120/58 mmHg  Pulse 84  Temp(Src) 97.2 F (36.2 C)  Resp 16  Ht 5\' 6"  (1.676 m)  Wt 141 lb (63.957 kg)  BMI 22.77 kg/m2  LMP  (LMP Unknown)  Appears well nourished and in no distress. Eyes: PERRLA, EOMs, conjunctiva no swelling or erythema. Sinuses: No frontal/maxillary tenderness ENT/Mouth: EAC's clear, TM's nl w/o erythema, bulging. Nares clear w/o erythema, swelling, exudates. Oropharynx clear without erythema or exudates. Oral hygiene is good. Tongue normal, non obstructing. Hearing intact.  Neck: Supple. Thyroid nl. Car 2+/2+ without bruits, nodes or JVD. Chest: Respirations nl with BS clear & equal w/o rales, rhonchi, wheezing or stridor.  Cor: Heart sounds normal w/ regular rate and rhythm without sig. murmurs, gallops, clicks, or rubs. Peripheral pulses normal and equal  without edema.  Palpable thrill & bruit over Lt Brachial AV fistula.  Abdomen: Soft & bowel sounds normal. Non-tender w/o guarding, rebound, hernias, masses, or organomegaly.  Lymphatics: Unremarkable.  Musculoskeletal: Full ROM all peripheral extremities, joint stability, 5/5 strength, and normal gait.  Skin: Warm, dry without exposed rashes, lesions or ecchymosis apparent.  Neuro: Cranial nerves intact, reflexes equal bilaterally. Sensory-motor testing grossly intact. Tendon reflexes grossly intact.   Assessment and Plan:  1. Essential hypertension  - TSH  2. Mixed hyperlipidemia  - Lipid panel - TSH  3. End stage renal disease on dialysis due to type 2 diabetes mellitus (HCC)  - Hemoglobin A1c  4. Vitamin D deficiency  - VITAMIN D 25 Hydroxy   5. Hypothyroidism  - TSH  6. Medication management  - CBC with Differential/Platelet - BASIC METABOLIC PANEL WITH GFR - Hepatic function panel - Magnesium   Recommended regular exercise,  BP monitoring, weight control, and discussed med and SE's. Recommended labs to assess and monitor clinical status. Further disposition pending results of labs. Over 30 minutes of exam, counseling, chart review was performed

## 2015-09-30 DIAGNOSIS — N2581 Secondary hyperparathyroidism of renal origin: Secondary | ICD-10-CM | POA: Diagnosis not present

## 2015-09-30 DIAGNOSIS — N186 End stage renal disease: Secondary | ICD-10-CM | POA: Diagnosis not present

## 2015-09-30 DIAGNOSIS — D509 Iron deficiency anemia, unspecified: Secondary | ICD-10-CM | POA: Diagnosis not present

## 2015-09-30 LAB — BASIC METABOLIC PANEL WITH GFR
BUN: 50 mg/dL — AB (ref 7–25)
CO2: 30 mmol/L (ref 20–31)
CREATININE: 8.94 mg/dL — AB (ref 0.50–0.99)
Calcium: 9.9 mg/dL (ref 8.6–10.4)
Chloride: 97 mmol/L — ABNORMAL LOW (ref 98–110)
GFR, EST AFRICAN AMERICAN: 5 mL/min — AB (ref >=60)
GFR, Est Non African American: 4 mL/min — ABNORMAL LOW (ref >=60)
GLUCOSE: 101 mg/dL — AB (ref 65–99)
Potassium: 6.7 mmol/L (ref 3.5–5.3)
Sodium: 144 mmol/L (ref 135–146)

## 2015-09-30 LAB — MAGNESIUM: Magnesium: 2.7 mg/dL — ABNORMAL HIGH (ref 1.5–2.5)

## 2015-09-30 LAB — HEPATIC FUNCTION PANEL
ALBUMIN: 4.3 g/dL (ref 3.6–5.1)
ALK PHOS: 87 U/L (ref 33–130)
ALT: 10 U/L (ref 6–29)
AST: 14 U/L (ref 10–35)
Bilirubin, Direct: 0.1 mg/dL (ref <=0.2)
Indirect Bilirubin: 0.3 mg/dL (ref 0.2–1.2)
TOTAL PROTEIN: 7.9 g/dL (ref 6.1–8.1)
Total Bilirubin: 0.4 mg/dL (ref 0.2–1.2)

## 2015-09-30 LAB — LIPID PANEL
Cholesterol: 190 mg/dL (ref 125–200)
HDL: 57 mg/dL (ref >=46)
LDL CALC: 90 mg/dL (ref <130)
TRIGLYCERIDES: 214 mg/dL — AB (ref <150)
Total CHOL/HDL Ratio: 3.3 Ratio (ref <=5.0)
VLDL: 43 mg/dL — ABNORMAL HIGH (ref <30)

## 2015-09-30 LAB — VITAMIN D 25 HYDROXY (VIT D DEFICIENCY, FRACTURES): VIT D 25 HYDROXY: 33 ng/mL (ref 30–100)

## 2015-10-02 DIAGNOSIS — N186 End stage renal disease: Secondary | ICD-10-CM | POA: Diagnosis not present

## 2015-10-02 DIAGNOSIS — D509 Iron deficiency anemia, unspecified: Secondary | ICD-10-CM | POA: Diagnosis not present

## 2015-10-02 DIAGNOSIS — N2581 Secondary hyperparathyroidism of renal origin: Secondary | ICD-10-CM | POA: Diagnosis not present

## 2015-10-05 DIAGNOSIS — D509 Iron deficiency anemia, unspecified: Secondary | ICD-10-CM | POA: Diagnosis not present

## 2015-10-05 DIAGNOSIS — N2581 Secondary hyperparathyroidism of renal origin: Secondary | ICD-10-CM | POA: Diagnosis not present

## 2015-10-05 DIAGNOSIS — N186 End stage renal disease: Secondary | ICD-10-CM | POA: Diagnosis not present

## 2015-10-07 DIAGNOSIS — N2581 Secondary hyperparathyroidism of renal origin: Secondary | ICD-10-CM | POA: Diagnosis not present

## 2015-10-07 DIAGNOSIS — N186 End stage renal disease: Secondary | ICD-10-CM | POA: Diagnosis not present

## 2015-10-07 DIAGNOSIS — D509 Iron deficiency anemia, unspecified: Secondary | ICD-10-CM | POA: Diagnosis not present

## 2015-10-09 DIAGNOSIS — N2581 Secondary hyperparathyroidism of renal origin: Secondary | ICD-10-CM | POA: Diagnosis not present

## 2015-10-09 DIAGNOSIS — D509 Iron deficiency anemia, unspecified: Secondary | ICD-10-CM | POA: Diagnosis not present

## 2015-10-09 DIAGNOSIS — N186 End stage renal disease: Secondary | ICD-10-CM | POA: Diagnosis not present

## 2015-10-12 DIAGNOSIS — N2581 Secondary hyperparathyroidism of renal origin: Secondary | ICD-10-CM | POA: Diagnosis not present

## 2015-10-12 DIAGNOSIS — D509 Iron deficiency anemia, unspecified: Secondary | ICD-10-CM | POA: Diagnosis not present

## 2015-10-12 DIAGNOSIS — N186 End stage renal disease: Secondary | ICD-10-CM | POA: Diagnosis not present

## 2015-10-14 DIAGNOSIS — N2581 Secondary hyperparathyroidism of renal origin: Secondary | ICD-10-CM | POA: Diagnosis not present

## 2015-10-14 DIAGNOSIS — D509 Iron deficiency anemia, unspecified: Secondary | ICD-10-CM | POA: Diagnosis not present

## 2015-10-14 DIAGNOSIS — N186 End stage renal disease: Secondary | ICD-10-CM | POA: Diagnosis not present

## 2015-10-16 DIAGNOSIS — N2581 Secondary hyperparathyroidism of renal origin: Secondary | ICD-10-CM | POA: Diagnosis not present

## 2015-10-16 DIAGNOSIS — N186 End stage renal disease: Secondary | ICD-10-CM | POA: Diagnosis not present

## 2015-10-16 DIAGNOSIS — D509 Iron deficiency anemia, unspecified: Secondary | ICD-10-CM | POA: Diagnosis not present

## 2015-10-19 DIAGNOSIS — E1122 Type 2 diabetes mellitus with diabetic chronic kidney disease: Secondary | ICD-10-CM | POA: Diagnosis not present

## 2015-10-19 DIAGNOSIS — E039 Hypothyroidism, unspecified: Secondary | ICD-10-CM | POA: Diagnosis not present

## 2015-10-19 DIAGNOSIS — Z992 Dependence on renal dialysis: Secondary | ICD-10-CM | POA: Diagnosis not present

## 2015-10-19 DIAGNOSIS — D509 Iron deficiency anemia, unspecified: Secondary | ICD-10-CM | POA: Diagnosis not present

## 2015-10-19 DIAGNOSIS — N186 End stage renal disease: Secondary | ICD-10-CM | POA: Diagnosis not present

## 2015-10-19 DIAGNOSIS — I120 Hypertensive chronic kidney disease with stage 5 chronic kidney disease or end stage renal disease: Secondary | ICD-10-CM | POA: Diagnosis not present

## 2015-10-19 DIAGNOSIS — N2581 Secondary hyperparathyroidism of renal origin: Secondary | ICD-10-CM | POA: Diagnosis not present

## 2015-10-21 DIAGNOSIS — D509 Iron deficiency anemia, unspecified: Secondary | ICD-10-CM | POA: Diagnosis not present

## 2015-10-21 DIAGNOSIS — N2581 Secondary hyperparathyroidism of renal origin: Secondary | ICD-10-CM | POA: Diagnosis not present

## 2015-10-21 DIAGNOSIS — N186 End stage renal disease: Secondary | ICD-10-CM | POA: Diagnosis not present

## 2015-10-23 DIAGNOSIS — N186 End stage renal disease: Secondary | ICD-10-CM | POA: Diagnosis not present

## 2015-10-23 DIAGNOSIS — N2581 Secondary hyperparathyroidism of renal origin: Secondary | ICD-10-CM | POA: Diagnosis not present

## 2015-10-23 DIAGNOSIS — D509 Iron deficiency anemia, unspecified: Secondary | ICD-10-CM | POA: Diagnosis not present

## 2015-10-26 DIAGNOSIS — N186 End stage renal disease: Secondary | ICD-10-CM | POA: Diagnosis not present

## 2015-10-26 DIAGNOSIS — D509 Iron deficiency anemia, unspecified: Secondary | ICD-10-CM | POA: Diagnosis not present

## 2015-10-26 DIAGNOSIS — N2581 Secondary hyperparathyroidism of renal origin: Secondary | ICD-10-CM | POA: Diagnosis not present

## 2015-10-28 DIAGNOSIS — N2581 Secondary hyperparathyroidism of renal origin: Secondary | ICD-10-CM | POA: Diagnosis not present

## 2015-10-28 DIAGNOSIS — N186 End stage renal disease: Secondary | ICD-10-CM | POA: Diagnosis not present

## 2015-10-28 DIAGNOSIS — D509 Iron deficiency anemia, unspecified: Secondary | ICD-10-CM | POA: Diagnosis not present

## 2015-10-29 DIAGNOSIS — N186 End stage renal disease: Secondary | ICD-10-CM | POA: Diagnosis not present

## 2015-10-29 DIAGNOSIS — E1022 Type 1 diabetes mellitus with diabetic chronic kidney disease: Secondary | ICD-10-CM | POA: Diagnosis not present

## 2015-10-29 DIAGNOSIS — Z992 Dependence on renal dialysis: Secondary | ICD-10-CM | POA: Diagnosis not present

## 2015-10-30 DIAGNOSIS — D631 Anemia in chronic kidney disease: Secondary | ICD-10-CM | POA: Diagnosis not present

## 2015-10-30 DIAGNOSIS — N186 End stage renal disease: Secondary | ICD-10-CM | POA: Diagnosis not present

## 2015-10-30 DIAGNOSIS — N2581 Secondary hyperparathyroidism of renal origin: Secondary | ICD-10-CM | POA: Diagnosis not present

## 2015-10-30 DIAGNOSIS — D509 Iron deficiency anemia, unspecified: Secondary | ICD-10-CM | POA: Diagnosis not present

## 2015-11-02 DIAGNOSIS — N186 End stage renal disease: Secondary | ICD-10-CM | POA: Diagnosis not present

## 2015-11-02 DIAGNOSIS — D631 Anemia in chronic kidney disease: Secondary | ICD-10-CM | POA: Diagnosis not present

## 2015-11-02 DIAGNOSIS — D509 Iron deficiency anemia, unspecified: Secondary | ICD-10-CM | POA: Diagnosis not present

## 2015-11-02 DIAGNOSIS — N2581 Secondary hyperparathyroidism of renal origin: Secondary | ICD-10-CM | POA: Diagnosis not present

## 2015-11-04 DIAGNOSIS — N186 End stage renal disease: Secondary | ICD-10-CM | POA: Diagnosis not present

## 2015-11-04 DIAGNOSIS — N2581 Secondary hyperparathyroidism of renal origin: Secondary | ICD-10-CM | POA: Diagnosis not present

## 2015-11-04 DIAGNOSIS — D509 Iron deficiency anemia, unspecified: Secondary | ICD-10-CM | POA: Diagnosis not present

## 2015-11-04 DIAGNOSIS — D631 Anemia in chronic kidney disease: Secondary | ICD-10-CM | POA: Diagnosis not present

## 2015-11-05 DIAGNOSIS — Z7682 Awaiting organ transplant status: Secondary | ICD-10-CM | POA: Diagnosis not present

## 2015-11-06 DIAGNOSIS — D631 Anemia in chronic kidney disease: Secondary | ICD-10-CM | POA: Diagnosis not present

## 2015-11-06 DIAGNOSIS — D509 Iron deficiency anemia, unspecified: Secondary | ICD-10-CM | POA: Diagnosis not present

## 2015-11-06 DIAGNOSIS — N2581 Secondary hyperparathyroidism of renal origin: Secondary | ICD-10-CM | POA: Diagnosis not present

## 2015-11-06 DIAGNOSIS — N186 End stage renal disease: Secondary | ICD-10-CM | POA: Diagnosis not present

## 2015-11-08 ENCOUNTER — Other Ambulatory Visit: Payer: Self-pay | Admitting: Internal Medicine

## 2015-11-09 ENCOUNTER — Other Ambulatory Visit: Payer: Self-pay | Admitting: Internal Medicine

## 2015-11-09 DIAGNOSIS — D631 Anemia in chronic kidney disease: Secondary | ICD-10-CM | POA: Diagnosis not present

## 2015-11-09 DIAGNOSIS — N2581 Secondary hyperparathyroidism of renal origin: Secondary | ICD-10-CM | POA: Diagnosis not present

## 2015-11-09 DIAGNOSIS — N186 End stage renal disease: Secondary | ICD-10-CM | POA: Diagnosis not present

## 2015-11-09 DIAGNOSIS — D509 Iron deficiency anemia, unspecified: Secondary | ICD-10-CM | POA: Diagnosis not present

## 2015-11-09 DIAGNOSIS — Z1231 Encounter for screening mammogram for malignant neoplasm of breast: Secondary | ICD-10-CM

## 2015-11-11 ENCOUNTER — Ambulatory Visit
Admission: RE | Admit: 2015-11-11 | Discharge: 2015-11-11 | Disposition: A | Discharge status: Home / Self Care | Payer: Medicare Other | Source: Ambulatory Visit | Attending: Internal Medicine | Admitting: Internal Medicine

## 2015-11-11 DIAGNOSIS — D631 Anemia in chronic kidney disease: Secondary | ICD-10-CM | POA: Diagnosis not present

## 2015-11-11 DIAGNOSIS — Z1231 Encounter for screening mammogram for malignant neoplasm of breast: Secondary | ICD-10-CM | POA: Diagnosis not present

## 2015-11-11 DIAGNOSIS — D509 Iron deficiency anemia, unspecified: Secondary | ICD-10-CM | POA: Diagnosis not present

## 2015-11-11 DIAGNOSIS — N2581 Secondary hyperparathyroidism of renal origin: Secondary | ICD-10-CM | POA: Diagnosis not present

## 2015-11-11 DIAGNOSIS — N186 End stage renal disease: Secondary | ICD-10-CM | POA: Diagnosis not present

## 2015-11-13 DIAGNOSIS — N186 End stage renal disease: Secondary | ICD-10-CM | POA: Diagnosis not present

## 2015-11-13 DIAGNOSIS — D509 Iron deficiency anemia, unspecified: Secondary | ICD-10-CM | POA: Diagnosis not present

## 2015-11-13 DIAGNOSIS — N2581 Secondary hyperparathyroidism of renal origin: Secondary | ICD-10-CM | POA: Diagnosis not present

## 2015-11-13 DIAGNOSIS — D631 Anemia in chronic kidney disease: Secondary | ICD-10-CM | POA: Diagnosis not present

## 2015-11-16 DIAGNOSIS — D631 Anemia in chronic kidney disease: Secondary | ICD-10-CM | POA: Diagnosis not present

## 2015-11-16 DIAGNOSIS — D509 Iron deficiency anemia, unspecified: Secondary | ICD-10-CM | POA: Diagnosis not present

## 2015-11-16 DIAGNOSIS — N2581 Secondary hyperparathyroidism of renal origin: Secondary | ICD-10-CM | POA: Diagnosis not present

## 2015-11-16 DIAGNOSIS — N186 End stage renal disease: Secondary | ICD-10-CM | POA: Diagnosis not present

## 2015-11-18 DIAGNOSIS — N2581 Secondary hyperparathyroidism of renal origin: Secondary | ICD-10-CM | POA: Diagnosis not present

## 2015-11-18 DIAGNOSIS — N186 End stage renal disease: Secondary | ICD-10-CM | POA: Diagnosis not present

## 2015-11-18 DIAGNOSIS — D631 Anemia in chronic kidney disease: Secondary | ICD-10-CM | POA: Diagnosis not present

## 2015-11-18 DIAGNOSIS — D509 Iron deficiency anemia, unspecified: Secondary | ICD-10-CM | POA: Diagnosis not present

## 2015-11-20 DIAGNOSIS — D509 Iron deficiency anemia, unspecified: Secondary | ICD-10-CM | POA: Diagnosis not present

## 2015-11-20 DIAGNOSIS — D631 Anemia in chronic kidney disease: Secondary | ICD-10-CM | POA: Diagnosis not present

## 2015-11-20 DIAGNOSIS — N186 End stage renal disease: Secondary | ICD-10-CM | POA: Diagnosis not present

## 2015-11-20 DIAGNOSIS — N2581 Secondary hyperparathyroidism of renal origin: Secondary | ICD-10-CM | POA: Diagnosis not present

## 2015-11-23 DIAGNOSIS — D509 Iron deficiency anemia, unspecified: Secondary | ICD-10-CM | POA: Diagnosis not present

## 2015-11-23 DIAGNOSIS — N2581 Secondary hyperparathyroidism of renal origin: Secondary | ICD-10-CM | POA: Diagnosis not present

## 2015-11-23 DIAGNOSIS — D631 Anemia in chronic kidney disease: Secondary | ICD-10-CM | POA: Diagnosis not present

## 2015-11-23 DIAGNOSIS — N186 End stage renal disease: Secondary | ICD-10-CM | POA: Diagnosis not present

## 2015-11-25 DIAGNOSIS — D509 Iron deficiency anemia, unspecified: Secondary | ICD-10-CM | POA: Diagnosis not present

## 2015-11-25 DIAGNOSIS — N2581 Secondary hyperparathyroidism of renal origin: Secondary | ICD-10-CM | POA: Diagnosis not present

## 2015-11-25 DIAGNOSIS — N186 End stage renal disease: Secondary | ICD-10-CM | POA: Diagnosis not present

## 2015-11-25 DIAGNOSIS — E11319 Type 2 diabetes mellitus with unspecified diabetic retinopathy without macular edema: Secondary | ICD-10-CM | POA: Diagnosis not present

## 2015-11-25 DIAGNOSIS — D631 Anemia in chronic kidney disease: Secondary | ICD-10-CM | POA: Diagnosis not present

## 2015-11-27 DIAGNOSIS — N2581 Secondary hyperparathyroidism of renal origin: Secondary | ICD-10-CM | POA: Diagnosis not present

## 2015-11-27 DIAGNOSIS — N186 End stage renal disease: Secondary | ICD-10-CM | POA: Diagnosis not present

## 2015-11-27 DIAGNOSIS — D631 Anemia in chronic kidney disease: Secondary | ICD-10-CM | POA: Diagnosis not present

## 2015-11-27 DIAGNOSIS — D509 Iron deficiency anemia, unspecified: Secondary | ICD-10-CM | POA: Diagnosis not present

## 2015-11-29 ENCOUNTER — Encounter: Payer: Self-pay | Admitting: Internal Medicine

## 2015-11-29 ENCOUNTER — Ambulatory Visit (INDEPENDENT_AMBULATORY_CARE_PROVIDER_SITE_OTHER): Payer: Medicare Other | Admitting: Internal Medicine

## 2015-11-29 ENCOUNTER — Other Ambulatory Visit (HOSPITAL_COMMUNITY)
Admission: RE | Admit: 2015-11-29 | Discharge: 2015-11-29 | Disposition: A | Discharge status: Home / Self Care | Payer: Medicare Other | Source: Ambulatory Visit | Attending: Internal Medicine | Admitting: Internal Medicine

## 2015-11-29 VITALS — BP 100/56 | HR 88 | Temp 97.3°F | Resp 16 | Ht 66.0 in | Wt 147.0 lb

## 2015-11-29 DIAGNOSIS — Z124 Encounter for screening for malignant neoplasm of cervix: Secondary | ICD-10-CM

## 2015-11-29 DIAGNOSIS — E1022 Type 1 diabetes mellitus with diabetic chronic kidney disease: Secondary | ICD-10-CM | POA: Diagnosis not present

## 2015-11-29 DIAGNOSIS — Z1151 Encounter for screening for human papillomavirus (HPV): Secondary | ICD-10-CM | POA: Insufficient documentation

## 2015-11-29 DIAGNOSIS — N186 End stage renal disease: Secondary | ICD-10-CM | POA: Diagnosis not present

## 2015-11-29 DIAGNOSIS — Z01419 Encounter for gynecological examination (general) (routine) without abnormal findings: Secondary | ICD-10-CM | POA: Insufficient documentation

## 2015-11-29 DIAGNOSIS — Z992 Dependence on renal dialysis: Secondary | ICD-10-CM | POA: Diagnosis not present

## 2015-11-29 NOTE — Progress Notes (Signed)
   Subjective:    Patient ID: Denise Bush, female    DOB: December 14, 1950, 65 y.o.   MRN: [id number]  HPI  Patient presents to the office for need for pap smear.  Patient is currently trying to get on the transplant list for a kidney transplant.  She is status post hysterectomy and has not had a period since she was 5.  She has not had a pap smear for at least 15 years.  She is currently having no vaginal complaints.  She originally had the hysterctomy due to endometriosis.    Review of Systems  Eyes: Negative.   Gastrointestinal: Negative for abdominal pain.  Genitourinary: Negative.  Negative for vaginal bleeding, vaginal discharge and vaginal pain.  Skin: Negative.   Psychiatric/Behavioral: The patient is not nervous/anxious.        Objective:   Physical Exam  Constitutional: She is oriented to person, place, and time. She appears well-developed and well-nourished. No distress.  HENT:  Head: Normocephalic.  Mouth/Throat: Oropharynx is clear and moist. No oropharyngeal exudate.  Eyes: No scleral icterus.  Neck: Normal range of motion. Neck supple. No JVD present. No thyromegaly present.  Cardiovascular: Normal rate, regular rhythm, normal heart sounds and intact distal pulses.  Exam reveals no gallop and no friction rub.   No murmur heard. Pulmonary/Chest: Effort normal and breath sounds normal. No respiratory distress. She has no wheezes. She has no rales. She exhibits no tenderness.  Abdominal: Soft. Bowel sounds are normal. She exhibits no distension and no mass. There is no tenderness. There is no rebound and no guarding.  Genitourinary: Vagina normal. No labial fusion. There is no rash, tenderness, lesion or injury on the right labia. There is no rash, tenderness, lesion or injury on the left labia. No erythema, tenderness or bleeding in the vagina. No foreign body in the vagina. No signs of injury around the vagina. No vaginal discharge found.  Genitourinary Comments: Uterus  surgically absent.  No palpable ovaries.  Vaginal mucosa moist and pink without obvious discharge.  No lesions.  Normal external female genitalia.  Lymphadenopathy:    She has no cervical adenopathy.  Neurological: She is alert and oriented to person, place, and time.  Skin: Skin is warm and dry. She is not diaphoretic.  Psychiatric: She has a normal mood and affect. Her behavior is normal. Judgment and thought content normal.  Nursing note and vitals reviewed.   Vitals:   11/29/15 1543  BP: (!) 100/56  Pulse: 88  Resp: 16  Temp: 97.3 F (36.3 C)         Assessment & Plan:    1. Screening for cervical cancer -normal exam - Cytology - PAP

## 2015-11-30 DIAGNOSIS — N186 End stage renal disease: Secondary | ICD-10-CM | POA: Diagnosis not present

## 2015-11-30 DIAGNOSIS — D631 Anemia in chronic kidney disease: Secondary | ICD-10-CM | POA: Diagnosis not present

## 2015-11-30 DIAGNOSIS — D509 Iron deficiency anemia, unspecified: Secondary | ICD-10-CM | POA: Diagnosis not present

## 2015-11-30 DIAGNOSIS — N2581 Secondary hyperparathyroidism of renal origin: Secondary | ICD-10-CM | POA: Diagnosis not present

## 2015-12-01 LAB — CYTOLOGY - PAP

## 2015-12-02 DIAGNOSIS — D509 Iron deficiency anemia, unspecified: Secondary | ICD-10-CM | POA: Diagnosis not present

## 2015-12-02 DIAGNOSIS — N186 End stage renal disease: Secondary | ICD-10-CM | POA: Diagnosis not present

## 2015-12-02 DIAGNOSIS — D631 Anemia in chronic kidney disease: Secondary | ICD-10-CM | POA: Diagnosis not present

## 2015-12-02 DIAGNOSIS — N2581 Secondary hyperparathyroidism of renal origin: Secondary | ICD-10-CM | POA: Diagnosis not present

## 2015-12-04 DIAGNOSIS — N186 End stage renal disease: Secondary | ICD-10-CM | POA: Diagnosis not present

## 2015-12-04 DIAGNOSIS — D509 Iron deficiency anemia, unspecified: Secondary | ICD-10-CM | POA: Diagnosis not present

## 2015-12-04 DIAGNOSIS — N2581 Secondary hyperparathyroidism of renal origin: Secondary | ICD-10-CM | POA: Diagnosis not present

## 2015-12-04 DIAGNOSIS — D631 Anemia in chronic kidney disease: Secondary | ICD-10-CM | POA: Diagnosis not present

## 2015-12-07 DIAGNOSIS — D509 Iron deficiency anemia, unspecified: Secondary | ICD-10-CM | POA: Diagnosis not present

## 2015-12-07 DIAGNOSIS — N186 End stage renal disease: Secondary | ICD-10-CM | POA: Diagnosis not present

## 2015-12-07 DIAGNOSIS — D631 Anemia in chronic kidney disease: Secondary | ICD-10-CM | POA: Diagnosis not present

## 2015-12-07 DIAGNOSIS — N2581 Secondary hyperparathyroidism of renal origin: Secondary | ICD-10-CM | POA: Diagnosis not present

## 2015-12-08 ENCOUNTER — Ambulatory Visit (INDEPENDENT_AMBULATORY_CARE_PROVIDER_SITE_OTHER): Payer: Medicare Other | Admitting: Podiatry

## 2015-12-08 DIAGNOSIS — B351 Tinea unguium: Secondary | ICD-10-CM

## 2015-12-08 DIAGNOSIS — E1040 Type 1 diabetes mellitus with diabetic neuropathy, unspecified: Secondary | ICD-10-CM | POA: Diagnosis not present

## 2015-12-08 DIAGNOSIS — M79606 Pain in leg, unspecified: Secondary | ICD-10-CM

## 2015-12-08 DIAGNOSIS — M79676 Pain in unspecified toe(s): Secondary | ICD-10-CM | POA: Diagnosis not present

## 2015-12-08 NOTE — Progress Notes (Signed)
Patient ID: Denise Bush, female   DOB: 02-01-1951, 65 y.o.   MRN: [id number] Complaint:  Visit Type: Patient returns to my office for continued preventative foot care services. Complaint: Patient states" my nails have grown long and thick and become painful to walk and wear shoes" Patient has been diagnosed with DM with no foot complications. The patient presents for preventative foot care services. No changes to ROS.  She is under dialysis treatment. Open lesion ulcer right foot has healed. Podiatric Exam: Vascular: dorsalis pedis and posterior tibial pulses are palpable bilateral. Capillary return is immediate. Temperature gradient is WNL. Skin turgor WNL  Sensorium: Diminished  Semmes Weinstein monofilament test. Normal tactile sensation bilaterally. Nail Exam: Pt has thick disfigured discolored nails with subungual debris noted bilateral entire nail hallux through fifth toenails Ulcer Exam: There is no evidence of ulcer or pre-ulcerative changes or infection. Orthopedic Exam: Muscle tone and strength are WNL. No limitations in general ROM. No crepitus or effusions noted. Foot type and digits show no abnormalities. Bony prominences are unremarkable. Skin:  Asymptomatic  Porokeratosis sub 5th met left foot. No infection or ulcers  Diagnosis:  Onychomycosis, , Pain in right toe, pain in left toes, .  Treatment & Plan Procedures and Treatment: Consent by patient was obtained for treatment procedures. The patient understood the discussion of treatment and procedures well. All questions were answered thoroughly reviewed. Debridement of mycotic and hypertrophic toenails, 1 through 5 bilateral and clearing of subungual debris. No ulceration, no infection noted.   . Return Visit-Office Procedure: Patient instructed to return to the office for a follow up visit 3 months for continued evaluation and treatment.Gardiner Barefoot DPM

## 2015-12-09 DIAGNOSIS — D631 Anemia in chronic kidney disease: Secondary | ICD-10-CM | POA: Diagnosis not present

## 2015-12-09 DIAGNOSIS — N2581 Secondary hyperparathyroidism of renal origin: Secondary | ICD-10-CM | POA: Diagnosis not present

## 2015-12-09 DIAGNOSIS — N186 End stage renal disease: Secondary | ICD-10-CM | POA: Diagnosis not present

## 2015-12-09 DIAGNOSIS — D509 Iron deficiency anemia, unspecified: Secondary | ICD-10-CM | POA: Diagnosis not present

## 2015-12-11 DIAGNOSIS — N186 End stage renal disease: Secondary | ICD-10-CM | POA: Diagnosis not present

## 2015-12-11 DIAGNOSIS — N2581 Secondary hyperparathyroidism of renal origin: Secondary | ICD-10-CM | POA: Diagnosis not present

## 2015-12-11 DIAGNOSIS — D509 Iron deficiency anemia, unspecified: Secondary | ICD-10-CM | POA: Diagnosis not present

## 2015-12-11 DIAGNOSIS — D631 Anemia in chronic kidney disease: Secondary | ICD-10-CM | POA: Diagnosis not present

## 2015-12-14 DIAGNOSIS — D509 Iron deficiency anemia, unspecified: Secondary | ICD-10-CM | POA: Diagnosis not present

## 2015-12-14 DIAGNOSIS — N186 End stage renal disease: Secondary | ICD-10-CM | POA: Diagnosis not present

## 2015-12-14 DIAGNOSIS — N2581 Secondary hyperparathyroidism of renal origin: Secondary | ICD-10-CM | POA: Diagnosis not present

## 2015-12-14 DIAGNOSIS — D631 Anemia in chronic kidney disease: Secondary | ICD-10-CM | POA: Diagnosis not present

## 2015-12-16 DIAGNOSIS — N2581 Secondary hyperparathyroidism of renal origin: Secondary | ICD-10-CM | POA: Diagnosis not present

## 2015-12-16 DIAGNOSIS — N186 End stage renal disease: Secondary | ICD-10-CM | POA: Diagnosis not present

## 2015-12-16 DIAGNOSIS — D631 Anemia in chronic kidney disease: Secondary | ICD-10-CM | POA: Diagnosis not present

## 2015-12-16 DIAGNOSIS — D509 Iron deficiency anemia, unspecified: Secondary | ICD-10-CM | POA: Diagnosis not present

## 2015-12-18 DIAGNOSIS — D631 Anemia in chronic kidney disease: Secondary | ICD-10-CM | POA: Diagnosis not present

## 2015-12-18 DIAGNOSIS — N2581 Secondary hyperparathyroidism of renal origin: Secondary | ICD-10-CM | POA: Diagnosis not present

## 2015-12-18 DIAGNOSIS — N186 End stage renal disease: Secondary | ICD-10-CM | POA: Diagnosis not present

## 2015-12-18 DIAGNOSIS — D509 Iron deficiency anemia, unspecified: Secondary | ICD-10-CM | POA: Diagnosis not present

## 2015-12-21 DIAGNOSIS — D631 Anemia in chronic kidney disease: Secondary | ICD-10-CM | POA: Diagnosis not present

## 2015-12-21 DIAGNOSIS — N2581 Secondary hyperparathyroidism of renal origin: Secondary | ICD-10-CM | POA: Diagnosis not present

## 2015-12-21 DIAGNOSIS — D509 Iron deficiency anemia, unspecified: Secondary | ICD-10-CM | POA: Diagnosis not present

## 2015-12-21 DIAGNOSIS — N186 End stage renal disease: Secondary | ICD-10-CM | POA: Diagnosis not present

## 2015-12-22 DIAGNOSIS — I871 Compression of vein: Secondary | ICD-10-CM | POA: Diagnosis not present

## 2015-12-22 DIAGNOSIS — Z992 Dependence on renal dialysis: Secondary | ICD-10-CM | POA: Diagnosis not present

## 2015-12-22 DIAGNOSIS — T82858D Stenosis of vascular prosthetic devices, implants and grafts, subsequent encounter: Secondary | ICD-10-CM | POA: Diagnosis not present

## 2015-12-22 DIAGNOSIS — T82858A Stenosis of vascular prosthetic devices, implants and grafts, initial encounter: Secondary | ICD-10-CM | POA: Diagnosis not present

## 2015-12-22 DIAGNOSIS — N186 End stage renal disease: Secondary | ICD-10-CM | POA: Diagnosis not present

## 2015-12-23 DIAGNOSIS — D631 Anemia in chronic kidney disease: Secondary | ICD-10-CM | POA: Diagnosis not present

## 2015-12-23 DIAGNOSIS — D509 Iron deficiency anemia, unspecified: Secondary | ICD-10-CM | POA: Diagnosis not present

## 2015-12-23 DIAGNOSIS — N186 End stage renal disease: Secondary | ICD-10-CM | POA: Diagnosis not present

## 2015-12-23 DIAGNOSIS — N2581 Secondary hyperparathyroidism of renal origin: Secondary | ICD-10-CM | POA: Diagnosis not present

## 2015-12-25 DIAGNOSIS — N2581 Secondary hyperparathyroidism of renal origin: Secondary | ICD-10-CM | POA: Diagnosis not present

## 2015-12-25 DIAGNOSIS — N186 End stage renal disease: Secondary | ICD-10-CM | POA: Diagnosis not present

## 2015-12-28 DIAGNOSIS — N186 End stage renal disease: Secondary | ICD-10-CM | POA: Diagnosis not present

## 2015-12-28 DIAGNOSIS — D631 Anemia in chronic kidney disease: Secondary | ICD-10-CM | POA: Diagnosis not present

## 2015-12-28 DIAGNOSIS — N2581 Secondary hyperparathyroidism of renal origin: Secondary | ICD-10-CM | POA: Diagnosis not present

## 2015-12-28 DIAGNOSIS — D509 Iron deficiency anemia, unspecified: Secondary | ICD-10-CM | POA: Diagnosis not present

## 2015-12-30 DIAGNOSIS — D631 Anemia in chronic kidney disease: Secondary | ICD-10-CM | POA: Diagnosis not present

## 2015-12-30 DIAGNOSIS — Z992 Dependence on renal dialysis: Secondary | ICD-10-CM | POA: Diagnosis not present

## 2015-12-30 DIAGNOSIS — N186 End stage renal disease: Secondary | ICD-10-CM | POA: Diagnosis not present

## 2015-12-30 DIAGNOSIS — N2581 Secondary hyperparathyroidism of renal origin: Secondary | ICD-10-CM | POA: Diagnosis not present

## 2015-12-30 DIAGNOSIS — E1022 Type 1 diabetes mellitus with diabetic chronic kidney disease: Secondary | ICD-10-CM | POA: Diagnosis not present

## 2015-12-30 DIAGNOSIS — D509 Iron deficiency anemia, unspecified: Secondary | ICD-10-CM | POA: Diagnosis not present

## 2016-01-01 DIAGNOSIS — D631 Anemia in chronic kidney disease: Secondary | ICD-10-CM | POA: Diagnosis not present

## 2016-01-01 DIAGNOSIS — N186 End stage renal disease: Secondary | ICD-10-CM | POA: Diagnosis not present

## 2016-01-01 DIAGNOSIS — Z23 Encounter for immunization: Secondary | ICD-10-CM | POA: Diagnosis not present

## 2016-01-01 DIAGNOSIS — N2581 Secondary hyperparathyroidism of renal origin: Secondary | ICD-10-CM | POA: Diagnosis not present

## 2016-01-04 DIAGNOSIS — D631 Anemia in chronic kidney disease: Secondary | ICD-10-CM | POA: Diagnosis not present

## 2016-01-04 DIAGNOSIS — Z23 Encounter for immunization: Secondary | ICD-10-CM | POA: Diagnosis not present

## 2016-01-04 DIAGNOSIS — N186 End stage renal disease: Secondary | ICD-10-CM | POA: Diagnosis not present

## 2016-01-04 DIAGNOSIS — N2581 Secondary hyperparathyroidism of renal origin: Secondary | ICD-10-CM | POA: Diagnosis not present

## 2016-01-05 ENCOUNTER — Ambulatory Visit (INDEPENDENT_AMBULATORY_CARE_PROVIDER_SITE_OTHER): Payer: Medicare Other | Admitting: Internal Medicine

## 2016-01-05 ENCOUNTER — Encounter: Payer: Self-pay | Admitting: Internal Medicine

## 2016-01-05 VITALS — BP 100/56 | HR 86 | Temp 97.8°F | Resp 16 | Ht 66.0 in | Wt 140.0 lb

## 2016-01-05 DIAGNOSIS — N186 End stage renal disease: Secondary | ICD-10-CM | POA: Diagnosis not present

## 2016-01-05 DIAGNOSIS — E1122 Type 2 diabetes mellitus with diabetic chronic kidney disease: Secondary | ICD-10-CM | POA: Diagnosis not present

## 2016-01-05 DIAGNOSIS — I10 Essential (primary) hypertension: Secondary | ICD-10-CM

## 2016-01-05 DIAGNOSIS — E113519 Type 2 diabetes mellitus with proliferative diabetic retinopathy with macular edema, unspecified eye: Secondary | ICD-10-CM | POA: Diagnosis not present

## 2016-01-05 DIAGNOSIS — Z992 Dependence on renal dialysis: Secondary | ICD-10-CM

## 2016-01-05 DIAGNOSIS — E559 Vitamin D deficiency, unspecified: Secondary | ICD-10-CM | POA: Diagnosis not present

## 2016-01-05 DIAGNOSIS — E782 Mixed hyperlipidemia: Secondary | ICD-10-CM

## 2016-01-05 DIAGNOSIS — E039 Hypothyroidism, unspecified: Secondary | ICD-10-CM

## 2016-01-05 DIAGNOSIS — Z79899 Other long term (current) drug therapy: Secondary | ICD-10-CM | POA: Diagnosis not present

## 2016-01-05 NOTE — Progress Notes (Signed)
Assessment and Plan:  Hypertension:  -Continue medication,  -monitor blood pressure at home.  -Continue DASH diet.   -Reminder to go to the ER if any CP, SOB, nausea, dizziness, severe HA, changes vision/speech, left arm numbness and tingling, and jaw pain.  Cholesterol: -Continue diet and exercise.   Diabetes: -well controlled off medications -cont to monitor -A1C recently checked by dialysis and she reports that it is 6.9 -Continue diet and exercise.   Vitamin D Def: -continue medications.   ESRD -cont dialysis -followed by neprhology -on transplant list  Diabetic retinopathy with near total blindness -cont control of blood sugars -followed by opthalmology    Continue diet and meds as discussed. Further disposition pending results of labs.  HPI 65 y.o. female  presents for 3 month follow up with hypertension, hyperlipidemia, prediabetes and vitamin D.   Her blood pressure has been controlled at home, today their BP is BP: (!) 100/56.   She does not workout. She denies chest pain, shortness of breath, dizziness.   She is on cholesterol medication and denies myalgias. Her cholesterol is at goal. The cholesterol last visit was:   Lab Results  Component Value Date   CHOL 190 09/29/2015   HDL 57 09/29/2015   LDLCALC 90 09/29/2015   TRIG 214 (H) 09/29/2015   CHOLHDL 3.3 09/29/2015     She has been working on diet and exercise for prediabetes, and denies foot ulcerations, hyperglycemia, hypoglycemia , increased appetite, nausea, paresthesia of the feet, polydipsia, polyuria, visual disturbances, vomiting and weight loss. Last A1C in the office was:  Lab Results  Component Value Date   HGBA1C 6.4 (H) 09/29/2015  She reports that she has been   Patient is on Vitamin D supplement.  Lab Results  Component Value Date   VD25OH 27 09/29/2015     She reports that she is on the transplant list.  She reports that she is just waiting for a kidney donor.  She reports that  she is having troubles with dry weights and low blood pressures.  She reports that she and the nephrologist have been working on this.    Her vision has not changed currently.  Current Medications:  Current Outpatient Prescriptions on File Prior to Visit  Medication Sig Dispense Refill  . Cholecalciferol (VITAMIN D3) 50000 UNITS CAPS Take 50,000 Units by mouth. With dialysis    . Cyanocobalamin (VITAMIN B 12 PO) Take 1,000 mcg by mouth daily. With dialysis    . ezetimibe (ZETIA) 10 MG tablet Take 10 mg by mouth daily.    Marland Kitchen glucose blood (PRODIGY NO CODING BLOOD GLUC) test strip 1 each.    . levothyroxine (SYNTHROID, LEVOTHROID) 150 MCG tablet Take 150 mcg by mouth daily before breakfast.    . OVER THE COUNTER MEDICATION Iron 65 mg 1 daily with dialysis    . SENSIPAR 30 MG tablet Take 1 tablet by mouth daily.    . sevelamer carbonate (RENVELA) 800 MG tablet Take 800 mg by mouth 3 (three) times daily with meals.     No current facility-administered medications on file prior to visit.     Medical History:  Past Medical History:  Diagnosis Date  . Allergy   . Blood transfusion without reported diagnosis   . Cataract    bilateral  . Diabetes mellitus without complication ([place])   . Diabetic retinopathy ([place])   . ESRD (end stage renal disease) ([place])   . Hashimoto's disease   . Hyperlipidemia   . Hypertension   .  Hypothyroid   . Loss of vision    both eyes, no vision left eye and little in left     Allergies:  Allergies  Allergen Reactions  . Hydralazine Shortness Of Breath    Chest pain fatigue  . Azor [Amlodipine-Olmesartan] Other (See Comments)    Sharp chest pain  Sharp chest pain  Chest pain  . Hydrochlorothiazide W-Triamterene   . Labetalol     Per patient, caused foot pain  . Lisinopril Other (See Comments)    Severe radiating foot pain.  . Metformin Itching    Loss of bowel control, general sick feeling  . Metoclopramide Other (See Comments)  . Other Other (See  Comments)    "contractions in throat" Diazide pt states cause throat to swell.  . Minoxidil Rash    Choking, neck contractions fatigue  . Spironolactone Other (See Comments) and Rash    Severe radiating pain in feet Severe radiating pain in feet.  . Statins Rash    Fatigue Cause fatigue     Review of Systems:  Review of Systems  Constitutional: Negative for chills, fever and malaise/fatigue.  HENT: Negative for congestion, ear pain and sore throat.   Eyes: Negative.   Respiratory: Negative for cough, shortness of breath and wheezing.   Cardiovascular: Negative for chest pain, palpitations and leg swelling.  Gastrointestinal: Negative for abdominal pain, blood in stool, constipation, diarrhea, heartburn and melena.  Genitourinary: Negative.   Skin: Negative.   Neurological: Negative for dizziness, sensory change, loss of consciousness and headaches.  Psychiatric/Behavioral: Negative for depression. The patient is not nervous/anxious and does not have insomnia.     Family history- Review and unchanged  Social history- Review and unchanged  Physical Exam: BP (!) 100/56   Pulse 86   Temp 97.8 F (36.6 C) (Temporal)   Resp 16   Ht 5\' 6"  (1.676 m)   Wt 140 lb (63.5 kg)   LMP  (LMP Unknown)   BMI 22.60 kg/m  Wt Readings from Last 3 Encounters:  01/05/16 140 lb (63.5 kg)  11/29/15 147 lb (66.7 kg)  09/29/15 141 lb (64 kg)    General Appearance: Well nourished well developed, in no apparent distress. Eyes: PERRLA, EOMs, conjunctiva no swelling or erythema ENT/Mouth: Ear canals normal without obstruction, swelling, erythma, discharge.  TMs normal bilaterally.  Oropharynx moist, clear, without exudate, or postoropharyngeal swelling. Neck: Supple, thyroid normal,no cervical adenopathy  Respiratory: Respiratory effort normal, Breath sounds clear A&P without rhonchi, wheeze, or rale.  No retractions, no accessory usage. Cardio: RRR with no MRGs. Brisk peripheral pulses without  edema. Palpable thrill in graft of the left upper arm Abdomen: Soft, + BS,  Non tender, no guarding, rebound, hernias, masses. Musculoskeletal: Full ROM, 5/5 strength, Normal gait with cane considering vision loss Skin: Warm, dry without rashes, lesions, ecchymosis.  Neuro: Awake and oriented X 3, Cranial nerves intact. Normal muscle tone, no cerebellar symptoms. Psych: Normal affect, Insight and Judgment appropriate.    Starlyn Skeans, PA-C 10:40 AM [facility]

## 2016-01-06 ENCOUNTER — Encounter: Payer: Self-pay | Admitting: Vascular Surgery

## 2016-01-06 DIAGNOSIS — N2581 Secondary hyperparathyroidism of renal origin: Secondary | ICD-10-CM | POA: Diagnosis not present

## 2016-01-06 DIAGNOSIS — D631 Anemia in chronic kidney disease: Secondary | ICD-10-CM | POA: Diagnosis not present

## 2016-01-06 DIAGNOSIS — Z23 Encounter for immunization: Secondary | ICD-10-CM | POA: Diagnosis not present

## 2016-01-06 DIAGNOSIS — N186 End stage renal disease: Secondary | ICD-10-CM | POA: Diagnosis not present

## 2016-01-08 DIAGNOSIS — N186 End stage renal disease: Secondary | ICD-10-CM | POA: Diagnosis not present

## 2016-01-08 DIAGNOSIS — Z23 Encounter for immunization: Secondary | ICD-10-CM | POA: Diagnosis not present

## 2016-01-08 DIAGNOSIS — N2581 Secondary hyperparathyroidism of renal origin: Secondary | ICD-10-CM | POA: Diagnosis not present

## 2016-01-08 DIAGNOSIS — D631 Anemia in chronic kidney disease: Secondary | ICD-10-CM | POA: Diagnosis not present

## 2016-01-11 DIAGNOSIS — N2581 Secondary hyperparathyroidism of renal origin: Secondary | ICD-10-CM | POA: Diagnosis not present

## 2016-01-11 DIAGNOSIS — D631 Anemia in chronic kidney disease: Secondary | ICD-10-CM | POA: Diagnosis not present

## 2016-01-11 DIAGNOSIS — N186 End stage renal disease: Secondary | ICD-10-CM | POA: Diagnosis not present

## 2016-01-11 DIAGNOSIS — Z23 Encounter for immunization: Secondary | ICD-10-CM | POA: Diagnosis not present

## 2016-01-12 ENCOUNTER — Ambulatory Visit (INDEPENDENT_AMBULATORY_CARE_PROVIDER_SITE_OTHER): Payer: Medicare Other | Admitting: Vascular Surgery

## 2016-01-12 ENCOUNTER — Encounter: Payer: Self-pay | Admitting: Vascular Surgery

## 2016-01-12 ENCOUNTER — Ambulatory Visit: Payer: Self-pay | Admitting: Internal Medicine

## 2016-01-12 VITALS — BP 113/72 | HR 82 | Ht 66.0 in | Wt 146.0 lb

## 2016-01-12 DIAGNOSIS — Z992 Dependence on renal dialysis: Secondary | ICD-10-CM

## 2016-01-12 DIAGNOSIS — N186 End stage renal disease: Secondary | ICD-10-CM | POA: Diagnosis not present

## 2016-01-12 NOTE — Progress Notes (Signed)
Patient name: Denise Bush MRN: [id number] DOB: Jul 27, 1950 Sex: female  REASON FOR CONSULT: Discuss poorly functioning left upper arm AV fistula  HPI: Denise Bush is a 65 y.o. female, in today for discussion of left upper arm AV fistula. She is here today with her husband. She is legally blind. She reports having a left upper arm AV fistula creation in [facility] in September 2014. She did not begin hemodialysis until November 2016. She's had multiple angioplasties of her fistula. Most recently this showed evidence of stenosis at the cephalic vein as it entered the deep system. She is seen today for discussion of potential surgical revision since this was somewhat resistant to angioplasty. She reports that currently she is having good dialysis flows. She denies any pain with the dialysis and also denies any persistent bleeding after hemodynamic hemodialysis. She denies any arm swelling.  Past Medical History:  Diagnosis Date  . Allergy   . Blood transfusion without reported diagnosis   . Cataract    bilateral  . Diabetes mellitus without complication ([place])   . Diabetic retinopathy ([place])   . ESRD (end stage renal disease) ([place])   . Hashimoto's disease   . Hyperlipidemia   . Hypertension   . Hypothyroid   . Loss of vision    both eyes, no vision left eye and little in left     Family History  Problem Relation Age of Onset  . Breast cancer Sister   . CAD      No family history  . Colon polyps Maternal Aunt   . Breast cancer Maternal Aunt   . Colon polyps Maternal Uncle   . Breast cancer Maternal Grandmother   . Colon cancer Neg Hx     SOCIAL HISTORY: Social History   Social History  . Marital status: Married    Spouse name: N/A  . Number of children: 6  . Years of education: N/A   Occupational History  . Not on file.   Social History Main Topics  . Smoking status: Never Smoker  . Smokeless tobacco: Never Used  . Alcohol use No  . Drug use:  Unknown  . Sexual activity: Not on file   Other Topics Concern  . Not on file   Social History Narrative  . No narrative on file    Allergies  Allergen Reactions  . Hydralazine Shortness Of Breath    Chest pain fatigue  . Azor [Amlodipine-Olmesartan] Other (See Comments)    Sharp chest pain  Sharp chest pain  Chest pain  . Hydrochlorothiazide W-Triamterene   . Labetalol     Per patient, caused foot pain  . Lisinopril Other (See Comments)    Severe radiating foot pain.  . Metformin Itching    Loss of bowel control, general sick feeling  . Metoclopramide Other (See Comments)  . Other Other (See Comments)    "contractions in throat" Diazide pt states cause throat to swell.  . Minoxidil Rash    Choking, neck contractions fatigue  . Spironolactone Other (See Comments) and Rash    Severe radiating pain in feet Severe radiating pain in feet.  . Statins Rash    Fatigue Cause fatigue    Current Outpatient Prescriptions  Medication Sig Dispense Refill  . Cholecalciferol (VITAMIN D3) 50000 UNITS CAPS Take 50,000 Units by mouth. With dialysis    . Cyanocobalamin (VITAMIN B 12 PO) Take 1,000 mcg by mouth daily. With dialysis    . ezetimibe (  ZETIA) 10 MG tablet Take 10 mg by mouth daily.    Marland Kitchen glucose blood (PRODIGY NO CODING BLOOD GLUC) test strip 1 each.    . levothyroxine (SYNTHROID, LEVOTHROID) 150 MCG tablet Take 150 mcg by mouth daily before breakfast.    . OVER THE COUNTER MEDICATION Iron 65 mg 1 daily with dialysis    . SENSIPAR 30 MG tablet Take 1 tablet by mouth daily.    . sevelamer carbonate (RENVELA) 800 MG tablet Take 800 mg by mouth 3 (three) times daily with meals.     No current facility-administered medications for this visit.     REVIEW OF SYSTEMS:  [X]  denotes positive finding, [ ]  denotes negative finding Cardiac  Comments:  Chest pain or chest pressure:     Shortness of breath upon exertion:    Short of breath when lying flat:    Irregular heart  rhythm:        Vascular    Pain in calf, thigh, or hip brought on by ambulation:    Pain in feet at night that wakes you up from your sleep:     Blood clot in your veins:    Leg swelling:         Pulmonary    Oxygen at home:    Productive cough:     Wheezing:         Neurologic    Sudden weakness in arms or legs:     Sudden numbness in arms or legs:  x   Sudden onset of difficulty speaking or slurred speech:    Temporary loss of vision in one eye:     Problems with dizziness:         Gastrointestinal    Blood in stool:     Vomited blood:         Genitourinary    Burning when urinating:     Blood in urine:        Psychiatric    Major depression:         Hematologic    Bleeding problems:    Problems with blood clotting too easily:        Skin    Rashes or ulcers:        Constitutional    Fever or chills:      PHYSICAL EXAM: Vitals:   01/12/16 1528  BP: 113/72  Pulse: 82  SpO2: (!) 70%  Weight: 146 lb (66.2 kg)  Height: 5\' 6"  (1.676 m)    GENERAL: The patient is a well-nourished female, in no acute distress. The vital signs are documented above. CARDIAC: There is a regular rate and rhythm.  VASCULAR: Palpable radial pulses bilaterally. She does have 2+ popliteal pulses and 1+ dorsalis pedis pulses bilaterally PULMONARY: There is good air exchange bilaterally without wheezing or rales. ABDOMEN: Soft and non-tender with normal pitched bowel sounds.  MUSCULOSKELETAL: There are no major deformities or cyanosis. NEUROLOGIC: No focal weakness or paresthesias are detected. SKIN: There are no ulcers or rashes noted. PSYCHIATRIC: The patient has a normal affect. Upper arm AV fistula with no evidence of skin ulceration or aneurysmal degeneration. Check she has a very nice thrill throughout the fistula with no pulsatile nature ` DATA:   She was to have her disks sent over from [facility] portion does not happen and the center is closed.  MEDICAL  ISSUES:  Currently has good function from her AV fistula. I will obtain her images from her most recent shuntogram  and angioplasty. Had a long discussion with the patient and her husband. One option would be to proactively treat this surgically and the other would be to continue to watch and she is having good function and operate should she have high flow. Will make further recommendation once reviewing her   [facility], [facility] [employer] [phone]

## 2016-01-13 ENCOUNTER — Encounter: Payer: Self-pay | Admitting: Nephrology

## 2016-01-13 DIAGNOSIS — N2581 Secondary hyperparathyroidism of renal origin: Secondary | ICD-10-CM | POA: Diagnosis not present

## 2016-01-13 DIAGNOSIS — Z23 Encounter for immunization: Secondary | ICD-10-CM | POA: Diagnosis not present

## 2016-01-13 DIAGNOSIS — D631 Anemia in chronic kidney disease: Secondary | ICD-10-CM | POA: Diagnosis not present

## 2016-01-13 DIAGNOSIS — N186 End stage renal disease: Secondary | ICD-10-CM | POA: Diagnosis not present

## 2016-01-15 DIAGNOSIS — Z23 Encounter for immunization: Secondary | ICD-10-CM | POA: Diagnosis not present

## 2016-01-15 DIAGNOSIS — D631 Anemia in chronic kidney disease: Secondary | ICD-10-CM | POA: Diagnosis not present

## 2016-01-15 DIAGNOSIS — N2581 Secondary hyperparathyroidism of renal origin: Secondary | ICD-10-CM | POA: Diagnosis not present

## 2016-01-15 DIAGNOSIS — N186 End stage renal disease: Secondary | ICD-10-CM | POA: Diagnosis not present

## 2016-01-18 DIAGNOSIS — N186 End stage renal disease: Secondary | ICD-10-CM | POA: Diagnosis not present

## 2016-01-18 DIAGNOSIS — N2581 Secondary hyperparathyroidism of renal origin: Secondary | ICD-10-CM | POA: Diagnosis not present

## 2016-01-18 DIAGNOSIS — D631 Anemia in chronic kidney disease: Secondary | ICD-10-CM | POA: Diagnosis not present

## 2016-01-18 DIAGNOSIS — Z23 Encounter for immunization: Secondary | ICD-10-CM | POA: Diagnosis not present

## 2016-01-20 DIAGNOSIS — N2581 Secondary hyperparathyroidism of renal origin: Secondary | ICD-10-CM | POA: Diagnosis not present

## 2016-01-20 DIAGNOSIS — Z23 Encounter for immunization: Secondary | ICD-10-CM | POA: Diagnosis not present

## 2016-01-20 DIAGNOSIS — N186 End stage renal disease: Secondary | ICD-10-CM | POA: Diagnosis not present

## 2016-01-20 DIAGNOSIS — D631 Anemia in chronic kidney disease: Secondary | ICD-10-CM | POA: Diagnosis not present

## 2016-01-22 DIAGNOSIS — D631 Anemia in chronic kidney disease: Secondary | ICD-10-CM | POA: Diagnosis not present

## 2016-01-22 DIAGNOSIS — Z23 Encounter for immunization: Secondary | ICD-10-CM | POA: Diagnosis not present

## 2016-01-22 DIAGNOSIS — N186 End stage renal disease: Secondary | ICD-10-CM | POA: Diagnosis not present

## 2016-01-22 DIAGNOSIS — N2581 Secondary hyperparathyroidism of renal origin: Secondary | ICD-10-CM | POA: Diagnosis not present

## 2016-01-25 DIAGNOSIS — Z23 Encounter for immunization: Secondary | ICD-10-CM | POA: Diagnosis not present

## 2016-01-25 DIAGNOSIS — D631 Anemia in chronic kidney disease: Secondary | ICD-10-CM | POA: Diagnosis not present

## 2016-01-25 DIAGNOSIS — N186 End stage renal disease: Secondary | ICD-10-CM | POA: Diagnosis not present

## 2016-01-25 DIAGNOSIS — N2581 Secondary hyperparathyroidism of renal origin: Secondary | ICD-10-CM | POA: Diagnosis not present

## 2016-01-27 DIAGNOSIS — D631 Anemia in chronic kidney disease: Secondary | ICD-10-CM | POA: Diagnosis not present

## 2016-01-27 DIAGNOSIS — N2581 Secondary hyperparathyroidism of renal origin: Secondary | ICD-10-CM | POA: Diagnosis not present

## 2016-01-27 DIAGNOSIS — Z23 Encounter for immunization: Secondary | ICD-10-CM | POA: Diagnosis not present

## 2016-01-27 DIAGNOSIS — N186 End stage renal disease: Secondary | ICD-10-CM | POA: Diagnosis not present

## 2016-01-29 DIAGNOSIS — D631 Anemia in chronic kidney disease: Secondary | ICD-10-CM | POA: Diagnosis not present

## 2016-01-29 DIAGNOSIS — N2581 Secondary hyperparathyroidism of renal origin: Secondary | ICD-10-CM | POA: Diagnosis not present

## 2016-01-29 DIAGNOSIS — Z992 Dependence on renal dialysis: Secondary | ICD-10-CM | POA: Diagnosis not present

## 2016-01-29 DIAGNOSIS — E1022 Type 1 diabetes mellitus with diabetic chronic kidney disease: Secondary | ICD-10-CM | POA: Diagnosis not present

## 2016-01-29 DIAGNOSIS — Z23 Encounter for immunization: Secondary | ICD-10-CM | POA: Diagnosis not present

## 2016-01-29 DIAGNOSIS — N186 End stage renal disease: Secondary | ICD-10-CM | POA: Diagnosis not present

## 2016-02-01 DIAGNOSIS — N2581 Secondary hyperparathyroidism of renal origin: Secondary | ICD-10-CM | POA: Diagnosis not present

## 2016-02-01 DIAGNOSIS — E1122 Type 2 diabetes mellitus with diabetic chronic kidney disease: Secondary | ICD-10-CM | POA: Diagnosis not present

## 2016-02-01 DIAGNOSIS — D631 Anemia in chronic kidney disease: Secondary | ICD-10-CM | POA: Diagnosis not present

## 2016-02-01 DIAGNOSIS — N186 End stage renal disease: Secondary | ICD-10-CM | POA: Diagnosis not present

## 2016-02-01 DIAGNOSIS — Z23 Encounter for immunization: Secondary | ICD-10-CM | POA: Diagnosis not present

## 2016-02-03 DIAGNOSIS — D631 Anemia in chronic kidney disease: Secondary | ICD-10-CM | POA: Diagnosis not present

## 2016-02-03 DIAGNOSIS — N2581 Secondary hyperparathyroidism of renal origin: Secondary | ICD-10-CM | POA: Diagnosis not present

## 2016-02-03 DIAGNOSIS — N186 End stage renal disease: Secondary | ICD-10-CM | POA: Diagnosis not present

## 2016-02-03 DIAGNOSIS — E1122 Type 2 diabetes mellitus with diabetic chronic kidney disease: Secondary | ICD-10-CM | POA: Diagnosis not present

## 2016-02-03 DIAGNOSIS — Z23 Encounter for immunization: Secondary | ICD-10-CM | POA: Diagnosis not present

## 2016-02-05 DIAGNOSIS — E1122 Type 2 diabetes mellitus with diabetic chronic kidney disease: Secondary | ICD-10-CM | POA: Diagnosis not present

## 2016-02-05 DIAGNOSIS — N2581 Secondary hyperparathyroidism of renal origin: Secondary | ICD-10-CM | POA: Diagnosis not present

## 2016-02-05 DIAGNOSIS — Z23 Encounter for immunization: Secondary | ICD-10-CM | POA: Diagnosis not present

## 2016-02-05 DIAGNOSIS — D631 Anemia in chronic kidney disease: Secondary | ICD-10-CM | POA: Diagnosis not present

## 2016-02-05 DIAGNOSIS — N186 End stage renal disease: Secondary | ICD-10-CM | POA: Diagnosis not present

## 2016-02-08 DIAGNOSIS — N186 End stage renal disease: Secondary | ICD-10-CM | POA: Diagnosis not present

## 2016-02-08 DIAGNOSIS — N2581 Secondary hyperparathyroidism of renal origin: Secondary | ICD-10-CM | POA: Diagnosis not present

## 2016-02-08 DIAGNOSIS — D631 Anemia in chronic kidney disease: Secondary | ICD-10-CM | POA: Diagnosis not present

## 2016-02-08 DIAGNOSIS — Z23 Encounter for immunization: Secondary | ICD-10-CM | POA: Diagnosis not present

## 2016-02-08 DIAGNOSIS — E1122 Type 2 diabetes mellitus with diabetic chronic kidney disease: Secondary | ICD-10-CM | POA: Diagnosis not present

## 2016-02-10 DIAGNOSIS — Z23 Encounter for immunization: Secondary | ICD-10-CM | POA: Diagnosis not present

## 2016-02-10 DIAGNOSIS — N2581 Secondary hyperparathyroidism of renal origin: Secondary | ICD-10-CM | POA: Diagnosis not present

## 2016-02-10 DIAGNOSIS — N186 End stage renal disease: Secondary | ICD-10-CM | POA: Diagnosis not present

## 2016-02-10 DIAGNOSIS — E1122 Type 2 diabetes mellitus with diabetic chronic kidney disease: Secondary | ICD-10-CM | POA: Diagnosis not present

## 2016-02-10 DIAGNOSIS — D631 Anemia in chronic kidney disease: Secondary | ICD-10-CM | POA: Diagnosis not present

## 2016-02-12 DIAGNOSIS — N2581 Secondary hyperparathyroidism of renal origin: Secondary | ICD-10-CM | POA: Diagnosis not present

## 2016-02-12 DIAGNOSIS — Z23 Encounter for immunization: Secondary | ICD-10-CM | POA: Diagnosis not present

## 2016-02-12 DIAGNOSIS — D631 Anemia in chronic kidney disease: Secondary | ICD-10-CM | POA: Diagnosis not present

## 2016-02-12 DIAGNOSIS — E1122 Type 2 diabetes mellitus with diabetic chronic kidney disease: Secondary | ICD-10-CM | POA: Diagnosis not present

## 2016-02-12 DIAGNOSIS — N186 End stage renal disease: Secondary | ICD-10-CM | POA: Diagnosis not present

## 2016-02-15 DIAGNOSIS — Z23 Encounter for immunization: Secondary | ICD-10-CM | POA: Diagnosis not present

## 2016-02-15 DIAGNOSIS — N2581 Secondary hyperparathyroidism of renal origin: Secondary | ICD-10-CM | POA: Diagnosis not present

## 2016-02-15 DIAGNOSIS — N186 End stage renal disease: Secondary | ICD-10-CM | POA: Diagnosis not present

## 2016-02-15 DIAGNOSIS — D631 Anemia in chronic kidney disease: Secondary | ICD-10-CM | POA: Diagnosis not present

## 2016-02-15 DIAGNOSIS — E1122 Type 2 diabetes mellitus with diabetic chronic kidney disease: Secondary | ICD-10-CM | POA: Diagnosis not present

## 2016-02-17 DIAGNOSIS — E1122 Type 2 diabetes mellitus with diabetic chronic kidney disease: Secondary | ICD-10-CM | POA: Diagnosis not present

## 2016-02-17 DIAGNOSIS — Z23 Encounter for immunization: Secondary | ICD-10-CM | POA: Diagnosis not present

## 2016-02-17 DIAGNOSIS — N186 End stage renal disease: Secondary | ICD-10-CM | POA: Diagnosis not present

## 2016-02-17 DIAGNOSIS — D631 Anemia in chronic kidney disease: Secondary | ICD-10-CM | POA: Diagnosis not present

## 2016-02-17 DIAGNOSIS — N2581 Secondary hyperparathyroidism of renal origin: Secondary | ICD-10-CM | POA: Diagnosis not present

## 2016-02-19 DIAGNOSIS — E1122 Type 2 diabetes mellitus with diabetic chronic kidney disease: Secondary | ICD-10-CM | POA: Diagnosis not present

## 2016-02-19 DIAGNOSIS — Z23 Encounter for immunization: Secondary | ICD-10-CM | POA: Diagnosis not present

## 2016-02-19 DIAGNOSIS — D631 Anemia in chronic kidney disease: Secondary | ICD-10-CM | POA: Diagnosis not present

## 2016-02-19 DIAGNOSIS — N2581 Secondary hyperparathyroidism of renal origin: Secondary | ICD-10-CM | POA: Diagnosis not present

## 2016-02-19 DIAGNOSIS — N186 End stage renal disease: Secondary | ICD-10-CM | POA: Diagnosis not present

## 2016-02-22 DIAGNOSIS — N2581 Secondary hyperparathyroidism of renal origin: Secondary | ICD-10-CM | POA: Diagnosis not present

## 2016-02-22 DIAGNOSIS — N186 End stage renal disease: Secondary | ICD-10-CM | POA: Diagnosis not present

## 2016-02-22 DIAGNOSIS — E1122 Type 2 diabetes mellitus with diabetic chronic kidney disease: Secondary | ICD-10-CM | POA: Diagnosis not present

## 2016-02-22 DIAGNOSIS — Z23 Encounter for immunization: Secondary | ICD-10-CM | POA: Diagnosis not present

## 2016-02-22 DIAGNOSIS — D631 Anemia in chronic kidney disease: Secondary | ICD-10-CM | POA: Diagnosis not present

## 2016-02-24 DIAGNOSIS — E1122 Type 2 diabetes mellitus with diabetic chronic kidney disease: Secondary | ICD-10-CM | POA: Diagnosis not present

## 2016-02-24 DIAGNOSIS — D631 Anemia in chronic kidney disease: Secondary | ICD-10-CM | POA: Diagnosis not present

## 2016-02-24 DIAGNOSIS — N186 End stage renal disease: Secondary | ICD-10-CM | POA: Diagnosis not present

## 2016-02-24 DIAGNOSIS — E11319 Type 2 diabetes mellitus with unspecified diabetic retinopathy without macular edema: Secondary | ICD-10-CM | POA: Diagnosis not present

## 2016-02-24 DIAGNOSIS — Z23 Encounter for immunization: Secondary | ICD-10-CM | POA: Diagnosis not present

## 2016-02-24 DIAGNOSIS — N2581 Secondary hyperparathyroidism of renal origin: Secondary | ICD-10-CM | POA: Diagnosis not present

## 2016-02-26 DIAGNOSIS — E1122 Type 2 diabetes mellitus with diabetic chronic kidney disease: Secondary | ICD-10-CM | POA: Diagnosis not present

## 2016-02-26 DIAGNOSIS — N2581 Secondary hyperparathyroidism of renal origin: Secondary | ICD-10-CM | POA: Diagnosis not present

## 2016-02-26 DIAGNOSIS — D631 Anemia in chronic kidney disease: Secondary | ICD-10-CM | POA: Diagnosis not present

## 2016-02-26 DIAGNOSIS — Z23 Encounter for immunization: Secondary | ICD-10-CM | POA: Diagnosis not present

## 2016-02-26 DIAGNOSIS — N186 End stage renal disease: Secondary | ICD-10-CM | POA: Diagnosis not present

## 2016-02-29 DIAGNOSIS — Z23 Encounter for immunization: Secondary | ICD-10-CM | POA: Diagnosis not present

## 2016-02-29 DIAGNOSIS — N2581 Secondary hyperparathyroidism of renal origin: Secondary | ICD-10-CM | POA: Diagnosis not present

## 2016-02-29 DIAGNOSIS — E1122 Type 2 diabetes mellitus with diabetic chronic kidney disease: Secondary | ICD-10-CM | POA: Diagnosis not present

## 2016-02-29 DIAGNOSIS — D631 Anemia in chronic kidney disease: Secondary | ICD-10-CM | POA: Diagnosis not present

## 2016-02-29 DIAGNOSIS — Z992 Dependence on renal dialysis: Secondary | ICD-10-CM | POA: Diagnosis not present

## 2016-02-29 DIAGNOSIS — N186 End stage renal disease: Secondary | ICD-10-CM | POA: Diagnosis not present

## 2016-02-29 DIAGNOSIS — E1022 Type 1 diabetes mellitus with diabetic chronic kidney disease: Secondary | ICD-10-CM | POA: Diagnosis not present

## 2016-03-02 DIAGNOSIS — E1122 Type 2 diabetes mellitus with diabetic chronic kidney disease: Secondary | ICD-10-CM | POA: Diagnosis not present

## 2016-03-02 DIAGNOSIS — N186 End stage renal disease: Secondary | ICD-10-CM | POA: Diagnosis not present

## 2016-03-02 DIAGNOSIS — D509 Iron deficiency anemia, unspecified: Secondary | ICD-10-CM | POA: Diagnosis not present

## 2016-03-02 DIAGNOSIS — N2581 Secondary hyperparathyroidism of renal origin: Secondary | ICD-10-CM | POA: Diagnosis not present

## 2016-03-04 DIAGNOSIS — N186 End stage renal disease: Secondary | ICD-10-CM | POA: Diagnosis not present

## 2016-03-04 DIAGNOSIS — D509 Iron deficiency anemia, unspecified: Secondary | ICD-10-CM | POA: Diagnosis not present

## 2016-03-04 DIAGNOSIS — N2581 Secondary hyperparathyroidism of renal origin: Secondary | ICD-10-CM | POA: Diagnosis not present

## 2016-03-04 DIAGNOSIS — E1122 Type 2 diabetes mellitus with diabetic chronic kidney disease: Secondary | ICD-10-CM | POA: Diagnosis not present

## 2016-03-07 DIAGNOSIS — N186 End stage renal disease: Secondary | ICD-10-CM | POA: Diagnosis not present

## 2016-03-07 DIAGNOSIS — D509 Iron deficiency anemia, unspecified: Secondary | ICD-10-CM | POA: Diagnosis not present

## 2016-03-07 DIAGNOSIS — E1122 Type 2 diabetes mellitus with diabetic chronic kidney disease: Secondary | ICD-10-CM | POA: Diagnosis not present

## 2016-03-07 DIAGNOSIS — N2581 Secondary hyperparathyroidism of renal origin: Secondary | ICD-10-CM | POA: Diagnosis not present

## 2016-03-08 DIAGNOSIS — E113513 Type 2 diabetes mellitus with proliferative diabetic retinopathy with macular edema, bilateral: Secondary | ICD-10-CM | POA: Diagnosis not present

## 2016-03-08 DIAGNOSIS — H3343 Traction detachment of retina, bilateral: Secondary | ICD-10-CM | POA: Diagnosis not present

## 2016-03-08 DIAGNOSIS — Z961 Presence of intraocular lens: Secondary | ICD-10-CM | POA: Diagnosis not present

## 2016-03-08 DIAGNOSIS — H2511 Age-related nuclear cataract, right eye: Secondary | ICD-10-CM | POA: Diagnosis not present

## 2016-03-08 DIAGNOSIS — H04129 Dry eye syndrome of unspecified lacrimal gland: Secondary | ICD-10-CM | POA: Diagnosis not present

## 2016-03-09 DIAGNOSIS — D509 Iron deficiency anemia, unspecified: Secondary | ICD-10-CM | POA: Diagnosis not present

## 2016-03-09 DIAGNOSIS — N186 End stage renal disease: Secondary | ICD-10-CM | POA: Diagnosis not present

## 2016-03-09 DIAGNOSIS — N2581 Secondary hyperparathyroidism of renal origin: Secondary | ICD-10-CM | POA: Diagnosis not present

## 2016-03-09 DIAGNOSIS — E1122 Type 2 diabetes mellitus with diabetic chronic kidney disease: Secondary | ICD-10-CM | POA: Diagnosis not present

## 2016-03-11 DIAGNOSIS — D509 Iron deficiency anemia, unspecified: Secondary | ICD-10-CM | POA: Diagnosis not present

## 2016-03-11 DIAGNOSIS — N2581 Secondary hyperparathyroidism of renal origin: Secondary | ICD-10-CM | POA: Diagnosis not present

## 2016-03-11 DIAGNOSIS — N186 End stage renal disease: Secondary | ICD-10-CM | POA: Diagnosis not present

## 2016-03-11 DIAGNOSIS — E1122 Type 2 diabetes mellitus with diabetic chronic kidney disease: Secondary | ICD-10-CM | POA: Diagnosis not present

## 2016-03-14 DIAGNOSIS — N2581 Secondary hyperparathyroidism of renal origin: Secondary | ICD-10-CM | POA: Diagnosis not present

## 2016-03-14 DIAGNOSIS — E1122 Type 2 diabetes mellitus with diabetic chronic kidney disease: Secondary | ICD-10-CM | POA: Diagnosis not present

## 2016-03-14 DIAGNOSIS — D509 Iron deficiency anemia, unspecified: Secondary | ICD-10-CM | POA: Diagnosis not present

## 2016-03-14 DIAGNOSIS — N186 End stage renal disease: Secondary | ICD-10-CM | POA: Diagnosis not present

## 2016-03-15 ENCOUNTER — Ambulatory Visit: Payer: Medicare Other | Admitting: Podiatry

## 2016-03-16 DIAGNOSIS — D509 Iron deficiency anemia, unspecified: Secondary | ICD-10-CM | POA: Diagnosis not present

## 2016-03-16 DIAGNOSIS — N186 End stage renal disease: Secondary | ICD-10-CM | POA: Diagnosis not present

## 2016-03-16 DIAGNOSIS — E1122 Type 2 diabetes mellitus with diabetic chronic kidney disease: Secondary | ICD-10-CM | POA: Diagnosis not present

## 2016-03-16 DIAGNOSIS — N2581 Secondary hyperparathyroidism of renal origin: Secondary | ICD-10-CM | POA: Diagnosis not present

## 2016-03-18 DIAGNOSIS — D509 Iron deficiency anemia, unspecified: Secondary | ICD-10-CM | POA: Diagnosis not present

## 2016-03-18 DIAGNOSIS — N2581 Secondary hyperparathyroidism of renal origin: Secondary | ICD-10-CM | POA: Diagnosis not present

## 2016-03-18 DIAGNOSIS — E1122 Type 2 diabetes mellitus with diabetic chronic kidney disease: Secondary | ICD-10-CM | POA: Diagnosis not present

## 2016-03-18 DIAGNOSIS — N186 End stage renal disease: Secondary | ICD-10-CM | POA: Diagnosis not present

## 2016-03-20 DIAGNOSIS — N186 End stage renal disease: Secondary | ICD-10-CM | POA: Diagnosis not present

## 2016-03-20 DIAGNOSIS — D509 Iron deficiency anemia, unspecified: Secondary | ICD-10-CM | POA: Diagnosis not present

## 2016-03-20 DIAGNOSIS — E1122 Type 2 diabetes mellitus with diabetic chronic kidney disease: Secondary | ICD-10-CM | POA: Diagnosis not present

## 2016-03-20 DIAGNOSIS — N2581 Secondary hyperparathyroidism of renal origin: Secondary | ICD-10-CM | POA: Diagnosis not present

## 2016-03-22 DIAGNOSIS — D509 Iron deficiency anemia, unspecified: Secondary | ICD-10-CM | POA: Diagnosis not present

## 2016-03-22 DIAGNOSIS — E1122 Type 2 diabetes mellitus with diabetic chronic kidney disease: Secondary | ICD-10-CM | POA: Diagnosis not present

## 2016-03-22 DIAGNOSIS — N186 End stage renal disease: Secondary | ICD-10-CM | POA: Diagnosis not present

## 2016-03-22 DIAGNOSIS — N2581 Secondary hyperparathyroidism of renal origin: Secondary | ICD-10-CM | POA: Diagnosis not present

## 2016-03-25 DIAGNOSIS — N2581 Secondary hyperparathyroidism of renal origin: Secondary | ICD-10-CM | POA: Diagnosis not present

## 2016-03-25 DIAGNOSIS — D509 Iron deficiency anemia, unspecified: Secondary | ICD-10-CM | POA: Diagnosis not present

## 2016-03-25 DIAGNOSIS — N186 End stage renal disease: Secondary | ICD-10-CM | POA: Diagnosis not present

## 2016-03-25 DIAGNOSIS — E1122 Type 2 diabetes mellitus with diabetic chronic kidney disease: Secondary | ICD-10-CM | POA: Diagnosis not present

## 2016-03-28 DIAGNOSIS — E1122 Type 2 diabetes mellitus with diabetic chronic kidney disease: Secondary | ICD-10-CM | POA: Diagnosis not present

## 2016-03-28 DIAGNOSIS — N2581 Secondary hyperparathyroidism of renal origin: Secondary | ICD-10-CM | POA: Diagnosis not present

## 2016-03-28 DIAGNOSIS — N186 End stage renal disease: Secondary | ICD-10-CM | POA: Diagnosis not present

## 2016-03-28 DIAGNOSIS — D509 Iron deficiency anemia, unspecified: Secondary | ICD-10-CM | POA: Diagnosis not present

## 2016-03-30 DIAGNOSIS — E1122 Type 2 diabetes mellitus with diabetic chronic kidney disease: Secondary | ICD-10-CM | POA: Diagnosis not present

## 2016-03-30 DIAGNOSIS — D509 Iron deficiency anemia, unspecified: Secondary | ICD-10-CM | POA: Diagnosis not present

## 2016-03-30 DIAGNOSIS — Z992 Dependence on renal dialysis: Secondary | ICD-10-CM | POA: Diagnosis not present

## 2016-03-30 DIAGNOSIS — N186 End stage renal disease: Secondary | ICD-10-CM | POA: Diagnosis not present

## 2016-03-30 DIAGNOSIS — N2581 Secondary hyperparathyroidism of renal origin: Secondary | ICD-10-CM | POA: Diagnosis not present

## 2016-03-30 DIAGNOSIS — E1022 Type 1 diabetes mellitus with diabetic chronic kidney disease: Secondary | ICD-10-CM | POA: Diagnosis not present

## 2016-04-01 DIAGNOSIS — N186 End stage renal disease: Secondary | ICD-10-CM | POA: Diagnosis not present

## 2016-04-01 DIAGNOSIS — D509 Iron deficiency anemia, unspecified: Secondary | ICD-10-CM | POA: Diagnosis not present

## 2016-04-01 DIAGNOSIS — E1122 Type 2 diabetes mellitus with diabetic chronic kidney disease: Secondary | ICD-10-CM | POA: Diagnosis not present

## 2016-04-01 DIAGNOSIS — N2581 Secondary hyperparathyroidism of renal origin: Secondary | ICD-10-CM | POA: Diagnosis not present

## 2016-04-01 DIAGNOSIS — D631 Anemia in chronic kidney disease: Secondary | ICD-10-CM | POA: Diagnosis not present

## 2016-04-04 DIAGNOSIS — N186 End stage renal disease: Secondary | ICD-10-CM | POA: Diagnosis not present

## 2016-04-04 DIAGNOSIS — N2581 Secondary hyperparathyroidism of renal origin: Secondary | ICD-10-CM | POA: Diagnosis not present

## 2016-04-04 DIAGNOSIS — D509 Iron deficiency anemia, unspecified: Secondary | ICD-10-CM | POA: Diagnosis not present

## 2016-04-04 DIAGNOSIS — E1122 Type 2 diabetes mellitus with diabetic chronic kidney disease: Secondary | ICD-10-CM | POA: Diagnosis not present

## 2016-04-04 DIAGNOSIS — D631 Anemia in chronic kidney disease: Secondary | ICD-10-CM | POA: Diagnosis not present

## 2016-04-06 DIAGNOSIS — D509 Iron deficiency anemia, unspecified: Secondary | ICD-10-CM | POA: Diagnosis not present

## 2016-04-06 DIAGNOSIS — D631 Anemia in chronic kidney disease: Secondary | ICD-10-CM | POA: Diagnosis not present

## 2016-04-06 DIAGNOSIS — E1122 Type 2 diabetes mellitus with diabetic chronic kidney disease: Secondary | ICD-10-CM | POA: Diagnosis not present

## 2016-04-06 DIAGNOSIS — N2581 Secondary hyperparathyroidism of renal origin: Secondary | ICD-10-CM | POA: Diagnosis not present

## 2016-04-06 DIAGNOSIS — N186 End stage renal disease: Secondary | ICD-10-CM | POA: Diagnosis not present

## 2016-04-08 DIAGNOSIS — E1122 Type 2 diabetes mellitus with diabetic chronic kidney disease: Secondary | ICD-10-CM | POA: Diagnosis not present

## 2016-04-08 DIAGNOSIS — D631 Anemia in chronic kidney disease: Secondary | ICD-10-CM | POA: Diagnosis not present

## 2016-04-08 DIAGNOSIS — N2581 Secondary hyperparathyroidism of renal origin: Secondary | ICD-10-CM | POA: Diagnosis not present

## 2016-04-08 DIAGNOSIS — N186 End stage renal disease: Secondary | ICD-10-CM | POA: Diagnosis not present

## 2016-04-08 DIAGNOSIS — D509 Iron deficiency anemia, unspecified: Secondary | ICD-10-CM | POA: Diagnosis not present

## 2016-04-11 DIAGNOSIS — N186 End stage renal disease: Secondary | ICD-10-CM | POA: Diagnosis not present

## 2016-04-11 DIAGNOSIS — E1122 Type 2 diabetes mellitus with diabetic chronic kidney disease: Secondary | ICD-10-CM | POA: Diagnosis not present

## 2016-04-11 DIAGNOSIS — N2581 Secondary hyperparathyroidism of renal origin: Secondary | ICD-10-CM | POA: Diagnosis not present

## 2016-04-11 DIAGNOSIS — D509 Iron deficiency anemia, unspecified: Secondary | ICD-10-CM | POA: Diagnosis not present

## 2016-04-11 DIAGNOSIS — D631 Anemia in chronic kidney disease: Secondary | ICD-10-CM | POA: Diagnosis not present

## 2016-04-13 DIAGNOSIS — N2581 Secondary hyperparathyroidism of renal origin: Secondary | ICD-10-CM | POA: Diagnosis not present

## 2016-04-13 DIAGNOSIS — D631 Anemia in chronic kidney disease: Secondary | ICD-10-CM | POA: Diagnosis not present

## 2016-04-13 DIAGNOSIS — D509 Iron deficiency anemia, unspecified: Secondary | ICD-10-CM | POA: Diagnosis not present

## 2016-04-13 DIAGNOSIS — E1122 Type 2 diabetes mellitus with diabetic chronic kidney disease: Secondary | ICD-10-CM | POA: Diagnosis not present

## 2016-04-13 DIAGNOSIS — N186 End stage renal disease: Secondary | ICD-10-CM | POA: Diagnosis not present

## 2016-04-15 DIAGNOSIS — N186 End stage renal disease: Secondary | ICD-10-CM | POA: Diagnosis not present

## 2016-04-15 DIAGNOSIS — D509 Iron deficiency anemia, unspecified: Secondary | ICD-10-CM | POA: Diagnosis not present

## 2016-04-15 DIAGNOSIS — N2581 Secondary hyperparathyroidism of renal origin: Secondary | ICD-10-CM | POA: Diagnosis not present

## 2016-04-15 DIAGNOSIS — D631 Anemia in chronic kidney disease: Secondary | ICD-10-CM | POA: Diagnosis not present

## 2016-04-15 DIAGNOSIS — E1122 Type 2 diabetes mellitus with diabetic chronic kidney disease: Secondary | ICD-10-CM | POA: Diagnosis not present

## 2016-04-18 DIAGNOSIS — N186 End stage renal disease: Secondary | ICD-10-CM | POA: Diagnosis not present

## 2016-04-18 DIAGNOSIS — N2581 Secondary hyperparathyroidism of renal origin: Secondary | ICD-10-CM | POA: Diagnosis not present

## 2016-04-18 DIAGNOSIS — E1122 Type 2 diabetes mellitus with diabetic chronic kidney disease: Secondary | ICD-10-CM | POA: Diagnosis not present

## 2016-04-18 DIAGNOSIS — D631 Anemia in chronic kidney disease: Secondary | ICD-10-CM | POA: Diagnosis not present

## 2016-04-18 DIAGNOSIS — D509 Iron deficiency anemia, unspecified: Secondary | ICD-10-CM | POA: Diagnosis not present

## 2016-04-20 DIAGNOSIS — N186 End stage renal disease: Secondary | ICD-10-CM | POA: Diagnosis not present

## 2016-04-20 DIAGNOSIS — N2581 Secondary hyperparathyroidism of renal origin: Secondary | ICD-10-CM | POA: Diagnosis not present

## 2016-04-20 DIAGNOSIS — D509 Iron deficiency anemia, unspecified: Secondary | ICD-10-CM | POA: Diagnosis not present

## 2016-04-20 DIAGNOSIS — E1122 Type 2 diabetes mellitus with diabetic chronic kidney disease: Secondary | ICD-10-CM | POA: Diagnosis not present

## 2016-04-20 DIAGNOSIS — D631 Anemia in chronic kidney disease: Secondary | ICD-10-CM | POA: Diagnosis not present

## 2016-04-22 DIAGNOSIS — D631 Anemia in chronic kidney disease: Secondary | ICD-10-CM | POA: Diagnosis not present

## 2016-04-22 DIAGNOSIS — N186 End stage renal disease: Secondary | ICD-10-CM | POA: Diagnosis not present

## 2016-04-22 DIAGNOSIS — N2581 Secondary hyperparathyroidism of renal origin: Secondary | ICD-10-CM | POA: Diagnosis not present

## 2016-04-22 DIAGNOSIS — D509 Iron deficiency anemia, unspecified: Secondary | ICD-10-CM | POA: Diagnosis not present

## 2016-04-22 DIAGNOSIS — E1122 Type 2 diabetes mellitus with diabetic chronic kidney disease: Secondary | ICD-10-CM | POA: Diagnosis not present

## 2016-04-25 DIAGNOSIS — N186 End stage renal disease: Secondary | ICD-10-CM | POA: Diagnosis not present

## 2016-04-25 DIAGNOSIS — D509 Iron deficiency anemia, unspecified: Secondary | ICD-10-CM | POA: Diagnosis not present

## 2016-04-25 DIAGNOSIS — D631 Anemia in chronic kidney disease: Secondary | ICD-10-CM | POA: Diagnosis not present

## 2016-04-25 DIAGNOSIS — E1122 Type 2 diabetes mellitus with diabetic chronic kidney disease: Secondary | ICD-10-CM | POA: Diagnosis not present

## 2016-04-25 DIAGNOSIS — N2581 Secondary hyperparathyroidism of renal origin: Secondary | ICD-10-CM | POA: Diagnosis not present

## 2016-04-28 DIAGNOSIS — N186 End stage renal disease: Secondary | ICD-10-CM | POA: Diagnosis not present

## 2016-04-30 DIAGNOSIS — N186 End stage renal disease: Secondary | ICD-10-CM | POA: Diagnosis not present

## 2016-04-30 DIAGNOSIS — Z992 Dependence on renal dialysis: Secondary | ICD-10-CM | POA: Diagnosis not present

## 2016-04-30 DIAGNOSIS — E1022 Type 1 diabetes mellitus with diabetic chronic kidney disease: Secondary | ICD-10-CM | POA: Diagnosis not present

## 2016-05-02 DIAGNOSIS — D631 Anemia in chronic kidney disease: Secondary | ICD-10-CM | POA: Diagnosis not present

## 2016-05-02 DIAGNOSIS — N2581 Secondary hyperparathyroidism of renal origin: Secondary | ICD-10-CM | POA: Diagnosis not present

## 2016-05-02 DIAGNOSIS — D509 Iron deficiency anemia, unspecified: Secondary | ICD-10-CM | POA: Diagnosis not present

## 2016-05-02 DIAGNOSIS — N186 End stage renal disease: Secondary | ICD-10-CM | POA: Diagnosis not present

## 2016-05-02 DIAGNOSIS — E1122 Type 2 diabetes mellitus with diabetic chronic kidney disease: Secondary | ICD-10-CM | POA: Diagnosis not present

## 2016-05-03 ENCOUNTER — Ambulatory Visit: Payer: Medicare Other | Admitting: Podiatry

## 2016-05-04 DIAGNOSIS — E1122 Type 2 diabetes mellitus with diabetic chronic kidney disease: Secondary | ICD-10-CM | POA: Diagnosis not present

## 2016-05-04 DIAGNOSIS — N2581 Secondary hyperparathyroidism of renal origin: Secondary | ICD-10-CM | POA: Diagnosis not present

## 2016-05-04 DIAGNOSIS — D509 Iron deficiency anemia, unspecified: Secondary | ICD-10-CM | POA: Diagnosis not present

## 2016-05-04 DIAGNOSIS — N186 End stage renal disease: Secondary | ICD-10-CM | POA: Diagnosis not present

## 2016-05-04 DIAGNOSIS — D631 Anemia in chronic kidney disease: Secondary | ICD-10-CM | POA: Diagnosis not present

## 2016-05-06 DIAGNOSIS — D509 Iron deficiency anemia, unspecified: Secondary | ICD-10-CM | POA: Diagnosis not present

## 2016-05-06 DIAGNOSIS — N2581 Secondary hyperparathyroidism of renal origin: Secondary | ICD-10-CM | POA: Diagnosis not present

## 2016-05-06 DIAGNOSIS — N186 End stage renal disease: Secondary | ICD-10-CM | POA: Diagnosis not present

## 2016-05-06 DIAGNOSIS — E1122 Type 2 diabetes mellitus with diabetic chronic kidney disease: Secondary | ICD-10-CM | POA: Diagnosis not present

## 2016-05-06 DIAGNOSIS — D631 Anemia in chronic kidney disease: Secondary | ICD-10-CM | POA: Diagnosis not present

## 2016-05-09 DIAGNOSIS — D509 Iron deficiency anemia, unspecified: Secondary | ICD-10-CM | POA: Diagnosis not present

## 2016-05-09 DIAGNOSIS — D631 Anemia in chronic kidney disease: Secondary | ICD-10-CM | POA: Diagnosis not present

## 2016-05-09 DIAGNOSIS — N186 End stage renal disease: Secondary | ICD-10-CM | POA: Diagnosis not present

## 2016-05-09 DIAGNOSIS — E1122 Type 2 diabetes mellitus with diabetic chronic kidney disease: Secondary | ICD-10-CM | POA: Diagnosis not present

## 2016-05-09 DIAGNOSIS — N2581 Secondary hyperparathyroidism of renal origin: Secondary | ICD-10-CM | POA: Diagnosis not present

## 2016-05-10 ENCOUNTER — Encounter: Payer: Self-pay | Admitting: Podiatry

## 2016-05-10 ENCOUNTER — Ambulatory Visit (INDEPENDENT_AMBULATORY_CARE_PROVIDER_SITE_OTHER): Payer: Medicare Other | Admitting: Podiatry

## 2016-05-10 VITALS — Ht 66.0 in | Wt 146.0 lb

## 2016-05-10 DIAGNOSIS — B351 Tinea unguium: Secondary | ICD-10-CM

## 2016-05-10 DIAGNOSIS — M79606 Pain in leg, unspecified: Secondary | ICD-10-CM

## 2016-05-10 NOTE — Progress Notes (Signed)
Patient ID: Denise Bush, female   DOB: June 30, 1950, 66 y.o.   MRN: [id number] Complaint:  Visit Type: Patient returns to my office for continued preventative foot care services. Complaint: Patient states" my nails have grown long and thick and become painful to walk and wear shoes" Patient has been diagnosed with DM with no foot complications. The patient presents for preventative foot care services. No changes to ROS.  She is under dialysis treatment. Open lesion ulcer right foot has healed. Podiatric Exam: Vascular: dorsalis pedis and posterior tibial pulses are palpable bilateral. Capillary return is immediate. Temperature gradient is WNL. Skin turgor WNL  Sensorium: Diminished  Semmes Weinstein monofilament test. Normal tactile sensation bilaterally. Nail Exam: Pt has thick disfigured discolored nails with subungual debris noted bilateral entire nail hallux through fifth toenails Ulcer Exam: There is no evidence of ulcer or pre-ulcerative changes or infection. Orthopedic Exam: Muscle tone and strength are WNL. No limitations in general ROM. No crepitus or effusions noted. Foot type and digits show no abnormalities. Bony prominences are unremarkable. Skin:  Asymptomatic  Porokeratosis sub 5th met left foot. No infection or ulcers  Diagnosis:  Onychomycosis, , Pain in right toe, pain in left toes, .  Treatment & Plan Procedures and Treatment: Consent by patient was obtained for treatment procedures. The patient understood the discussion of treatment and procedures well. All questions were answered thoroughly reviewed. Debridement of mycotic and hypertrophic toenails, 1 through 5 bilateral and clearing of subungual debris. No ulceration, no infection noted.   . Return Visit-Office Procedure: Patient instructed to return to the office for a follow up visit 4 months for continued evaluation and treatment.Gardiner Barefoot DPM

## 2016-05-11 DIAGNOSIS — E1122 Type 2 diabetes mellitus with diabetic chronic kidney disease: Secondary | ICD-10-CM | POA: Diagnosis not present

## 2016-05-11 DIAGNOSIS — D631 Anemia in chronic kidney disease: Secondary | ICD-10-CM | POA: Diagnosis not present

## 2016-05-11 DIAGNOSIS — N186 End stage renal disease: Secondary | ICD-10-CM | POA: Diagnosis not present

## 2016-05-11 DIAGNOSIS — N2581 Secondary hyperparathyroidism of renal origin: Secondary | ICD-10-CM | POA: Diagnosis not present

## 2016-05-11 DIAGNOSIS — D509 Iron deficiency anemia, unspecified: Secondary | ICD-10-CM | POA: Diagnosis not present

## 2016-05-13 DIAGNOSIS — D509 Iron deficiency anemia, unspecified: Secondary | ICD-10-CM | POA: Diagnosis not present

## 2016-05-13 DIAGNOSIS — N2581 Secondary hyperparathyroidism of renal origin: Secondary | ICD-10-CM | POA: Diagnosis not present

## 2016-05-13 DIAGNOSIS — D631 Anemia in chronic kidney disease: Secondary | ICD-10-CM | POA: Diagnosis not present

## 2016-05-13 DIAGNOSIS — N186 End stage renal disease: Secondary | ICD-10-CM | POA: Diagnosis not present

## 2016-05-13 DIAGNOSIS — E1122 Type 2 diabetes mellitus with diabetic chronic kidney disease: Secondary | ICD-10-CM | POA: Diagnosis not present

## 2016-05-15 ENCOUNTER — Encounter: Payer: Self-pay | Admitting: Internal Medicine

## 2016-05-16 DIAGNOSIS — E1122 Type 2 diabetes mellitus with diabetic chronic kidney disease: Secondary | ICD-10-CM | POA: Diagnosis not present

## 2016-05-16 DIAGNOSIS — D509 Iron deficiency anemia, unspecified: Secondary | ICD-10-CM | POA: Diagnosis not present

## 2016-05-16 DIAGNOSIS — D631 Anemia in chronic kidney disease: Secondary | ICD-10-CM | POA: Diagnosis not present

## 2016-05-16 DIAGNOSIS — N186 End stage renal disease: Secondary | ICD-10-CM | POA: Diagnosis not present

## 2016-05-16 DIAGNOSIS — N2581 Secondary hyperparathyroidism of renal origin: Secondary | ICD-10-CM | POA: Diagnosis not present

## 2016-05-18 DIAGNOSIS — D631 Anemia in chronic kidney disease: Secondary | ICD-10-CM | POA: Diagnosis not present

## 2016-05-18 DIAGNOSIS — E1122 Type 2 diabetes mellitus with diabetic chronic kidney disease: Secondary | ICD-10-CM | POA: Diagnosis not present

## 2016-05-18 DIAGNOSIS — D509 Iron deficiency anemia, unspecified: Secondary | ICD-10-CM | POA: Diagnosis not present

## 2016-05-18 DIAGNOSIS — N186 End stage renal disease: Secondary | ICD-10-CM | POA: Diagnosis not present

## 2016-05-18 DIAGNOSIS — N2581 Secondary hyperparathyroidism of renal origin: Secondary | ICD-10-CM | POA: Diagnosis not present

## 2016-05-20 DIAGNOSIS — N2581 Secondary hyperparathyroidism of renal origin: Secondary | ICD-10-CM | POA: Diagnosis not present

## 2016-05-20 DIAGNOSIS — D631 Anemia in chronic kidney disease: Secondary | ICD-10-CM | POA: Diagnosis not present

## 2016-05-20 DIAGNOSIS — D509 Iron deficiency anemia, unspecified: Secondary | ICD-10-CM | POA: Diagnosis not present

## 2016-05-20 DIAGNOSIS — E1122 Type 2 diabetes mellitus with diabetic chronic kidney disease: Secondary | ICD-10-CM | POA: Diagnosis not present

## 2016-05-20 DIAGNOSIS — N186 End stage renal disease: Secondary | ICD-10-CM | POA: Diagnosis not present

## 2016-05-23 DIAGNOSIS — N186 End stage renal disease: Secondary | ICD-10-CM | POA: Diagnosis not present

## 2016-05-23 DIAGNOSIS — D631 Anemia in chronic kidney disease: Secondary | ICD-10-CM | POA: Diagnosis not present

## 2016-05-23 DIAGNOSIS — E1122 Type 2 diabetes mellitus with diabetic chronic kidney disease: Secondary | ICD-10-CM | POA: Diagnosis not present

## 2016-05-23 DIAGNOSIS — D509 Iron deficiency anemia, unspecified: Secondary | ICD-10-CM | POA: Diagnosis not present

## 2016-05-23 DIAGNOSIS — N2581 Secondary hyperparathyroidism of renal origin: Secondary | ICD-10-CM | POA: Diagnosis not present

## 2016-05-25 DIAGNOSIS — D631 Anemia in chronic kidney disease: Secondary | ICD-10-CM | POA: Diagnosis not present

## 2016-05-25 DIAGNOSIS — E11319 Type 2 diabetes mellitus with unspecified diabetic retinopathy without macular edema: Secondary | ICD-10-CM | POA: Diagnosis not present

## 2016-05-25 DIAGNOSIS — E1122 Type 2 diabetes mellitus with diabetic chronic kidney disease: Secondary | ICD-10-CM | POA: Diagnosis not present

## 2016-05-25 DIAGNOSIS — D509 Iron deficiency anemia, unspecified: Secondary | ICD-10-CM | POA: Diagnosis not present

## 2016-05-25 DIAGNOSIS — N186 End stage renal disease: Secondary | ICD-10-CM | POA: Diagnosis not present

## 2016-05-25 DIAGNOSIS — N2581 Secondary hyperparathyroidism of renal origin: Secondary | ICD-10-CM | POA: Diagnosis not present

## 2016-05-27 DIAGNOSIS — D631 Anemia in chronic kidney disease: Secondary | ICD-10-CM | POA: Diagnosis not present

## 2016-05-27 DIAGNOSIS — E1122 Type 2 diabetes mellitus with diabetic chronic kidney disease: Secondary | ICD-10-CM | POA: Diagnosis not present

## 2016-05-27 DIAGNOSIS — D509 Iron deficiency anemia, unspecified: Secondary | ICD-10-CM | POA: Diagnosis not present

## 2016-05-27 DIAGNOSIS — N2581 Secondary hyperparathyroidism of renal origin: Secondary | ICD-10-CM | POA: Diagnosis not present

## 2016-05-27 DIAGNOSIS — N186 End stage renal disease: Secondary | ICD-10-CM | POA: Diagnosis not present

## 2016-05-30 DIAGNOSIS — N2581 Secondary hyperparathyroidism of renal origin: Secondary | ICD-10-CM | POA: Diagnosis not present

## 2016-05-30 DIAGNOSIS — D631 Anemia in chronic kidney disease: Secondary | ICD-10-CM | POA: Diagnosis not present

## 2016-05-30 DIAGNOSIS — D509 Iron deficiency anemia, unspecified: Secondary | ICD-10-CM | POA: Diagnosis not present

## 2016-05-30 DIAGNOSIS — N186 End stage renal disease: Secondary | ICD-10-CM | POA: Diagnosis not present

## 2016-05-30 DIAGNOSIS — E1122 Type 2 diabetes mellitus with diabetic chronic kidney disease: Secondary | ICD-10-CM | POA: Diagnosis not present

## 2016-05-31 DIAGNOSIS — Z992 Dependence on renal dialysis: Secondary | ICD-10-CM | POA: Diagnosis not present

## 2016-05-31 DIAGNOSIS — N186 End stage renal disease: Secondary | ICD-10-CM | POA: Diagnosis not present

## 2016-05-31 DIAGNOSIS — E1022 Type 1 diabetes mellitus with diabetic chronic kidney disease: Secondary | ICD-10-CM | POA: Diagnosis not present

## 2016-06-01 DIAGNOSIS — E1122 Type 2 diabetes mellitus with diabetic chronic kidney disease: Secondary | ICD-10-CM | POA: Diagnosis not present

## 2016-06-01 DIAGNOSIS — D631 Anemia in chronic kidney disease: Secondary | ICD-10-CM | POA: Diagnosis not present

## 2016-06-01 DIAGNOSIS — N186 End stage renal disease: Secondary | ICD-10-CM | POA: Diagnosis not present

## 2016-06-01 DIAGNOSIS — N2581 Secondary hyperparathyroidism of renal origin: Secondary | ICD-10-CM | POA: Diagnosis not present

## 2016-06-03 DIAGNOSIS — N186 End stage renal disease: Secondary | ICD-10-CM | POA: Diagnosis not present

## 2016-06-03 DIAGNOSIS — E1122 Type 2 diabetes mellitus with diabetic chronic kidney disease: Secondary | ICD-10-CM | POA: Diagnosis not present

## 2016-06-03 DIAGNOSIS — N2581 Secondary hyperparathyroidism of renal origin: Secondary | ICD-10-CM | POA: Diagnosis not present

## 2016-06-03 DIAGNOSIS — D631 Anemia in chronic kidney disease: Secondary | ICD-10-CM | POA: Diagnosis not present

## 2016-06-06 DIAGNOSIS — E1122 Type 2 diabetes mellitus with diabetic chronic kidney disease: Secondary | ICD-10-CM | POA: Diagnosis not present

## 2016-06-06 DIAGNOSIS — N186 End stage renal disease: Secondary | ICD-10-CM | POA: Diagnosis not present

## 2016-06-06 DIAGNOSIS — D631 Anemia in chronic kidney disease: Secondary | ICD-10-CM | POA: Diagnosis not present

## 2016-06-06 DIAGNOSIS — N2581 Secondary hyperparathyroidism of renal origin: Secondary | ICD-10-CM | POA: Diagnosis not present

## 2016-06-08 DIAGNOSIS — D631 Anemia in chronic kidney disease: Secondary | ICD-10-CM | POA: Diagnosis not present

## 2016-06-08 DIAGNOSIS — E1122 Type 2 diabetes mellitus with diabetic chronic kidney disease: Secondary | ICD-10-CM | POA: Diagnosis not present

## 2016-06-08 DIAGNOSIS — N186 End stage renal disease: Secondary | ICD-10-CM | POA: Diagnosis not present

## 2016-06-08 DIAGNOSIS — N2581 Secondary hyperparathyroidism of renal origin: Secondary | ICD-10-CM | POA: Diagnosis not present

## 2016-06-10 DIAGNOSIS — E1122 Type 2 diabetes mellitus with diabetic chronic kidney disease: Secondary | ICD-10-CM | POA: Diagnosis not present

## 2016-06-10 DIAGNOSIS — N186 End stage renal disease: Secondary | ICD-10-CM | POA: Diagnosis not present

## 2016-06-10 DIAGNOSIS — N2581 Secondary hyperparathyroidism of renal origin: Secondary | ICD-10-CM | POA: Diagnosis not present

## 2016-06-10 DIAGNOSIS — D631 Anemia in chronic kidney disease: Secondary | ICD-10-CM | POA: Diagnosis not present

## 2016-06-13 DIAGNOSIS — N186 End stage renal disease: Secondary | ICD-10-CM | POA: Diagnosis not present

## 2016-06-13 DIAGNOSIS — E1122 Type 2 diabetes mellitus with diabetic chronic kidney disease: Secondary | ICD-10-CM | POA: Diagnosis not present

## 2016-06-13 DIAGNOSIS — D631 Anemia in chronic kidney disease: Secondary | ICD-10-CM | POA: Diagnosis not present

## 2016-06-13 DIAGNOSIS — N2581 Secondary hyperparathyroidism of renal origin: Secondary | ICD-10-CM | POA: Diagnosis not present

## 2016-06-15 DIAGNOSIS — N186 End stage renal disease: Secondary | ICD-10-CM | POA: Diagnosis not present

## 2016-06-15 DIAGNOSIS — N2581 Secondary hyperparathyroidism of renal origin: Secondary | ICD-10-CM | POA: Diagnosis not present

## 2016-06-15 DIAGNOSIS — D631 Anemia in chronic kidney disease: Secondary | ICD-10-CM | POA: Diagnosis not present

## 2016-06-15 DIAGNOSIS — E1122 Type 2 diabetes mellitus with diabetic chronic kidney disease: Secondary | ICD-10-CM | POA: Diagnosis not present

## 2016-06-17 DIAGNOSIS — D631 Anemia in chronic kidney disease: Secondary | ICD-10-CM | POA: Diagnosis not present

## 2016-06-17 DIAGNOSIS — E1122 Type 2 diabetes mellitus with diabetic chronic kidney disease: Secondary | ICD-10-CM | POA: Diagnosis not present

## 2016-06-17 DIAGNOSIS — N186 End stage renal disease: Secondary | ICD-10-CM | POA: Diagnosis not present

## 2016-06-17 DIAGNOSIS — N2581 Secondary hyperparathyroidism of renal origin: Secondary | ICD-10-CM | POA: Diagnosis not present

## 2016-06-20 DIAGNOSIS — D631 Anemia in chronic kidney disease: Secondary | ICD-10-CM | POA: Diagnosis not present

## 2016-06-20 DIAGNOSIS — N186 End stage renal disease: Secondary | ICD-10-CM | POA: Diagnosis not present

## 2016-06-20 DIAGNOSIS — E1122 Type 2 diabetes mellitus with diabetic chronic kidney disease: Secondary | ICD-10-CM | POA: Diagnosis not present

## 2016-06-20 DIAGNOSIS — N2581 Secondary hyperparathyroidism of renal origin: Secondary | ICD-10-CM | POA: Diagnosis not present

## 2016-06-22 DIAGNOSIS — E1122 Type 2 diabetes mellitus with diabetic chronic kidney disease: Secondary | ICD-10-CM | POA: Diagnosis not present

## 2016-06-22 DIAGNOSIS — D631 Anemia in chronic kidney disease: Secondary | ICD-10-CM | POA: Diagnosis not present

## 2016-06-22 DIAGNOSIS — N186 End stage renal disease: Secondary | ICD-10-CM | POA: Diagnosis not present

## 2016-06-22 DIAGNOSIS — N2581 Secondary hyperparathyroidism of renal origin: Secondary | ICD-10-CM | POA: Diagnosis not present

## 2016-06-24 DIAGNOSIS — N2581 Secondary hyperparathyroidism of renal origin: Secondary | ICD-10-CM | POA: Diagnosis not present

## 2016-06-24 DIAGNOSIS — E1122 Type 2 diabetes mellitus with diabetic chronic kidney disease: Secondary | ICD-10-CM | POA: Diagnosis not present

## 2016-06-24 DIAGNOSIS — N186 End stage renal disease: Secondary | ICD-10-CM | POA: Diagnosis not present

## 2016-06-24 DIAGNOSIS — D631 Anemia in chronic kidney disease: Secondary | ICD-10-CM | POA: Diagnosis not present

## 2016-06-27 DIAGNOSIS — N2581 Secondary hyperparathyroidism of renal origin: Secondary | ICD-10-CM | POA: Diagnosis not present

## 2016-06-27 DIAGNOSIS — N186 End stage renal disease: Secondary | ICD-10-CM | POA: Diagnosis not present

## 2016-06-27 DIAGNOSIS — D631 Anemia in chronic kidney disease: Secondary | ICD-10-CM | POA: Diagnosis not present

## 2016-06-27 DIAGNOSIS — E1122 Type 2 diabetes mellitus with diabetic chronic kidney disease: Secondary | ICD-10-CM | POA: Diagnosis not present

## 2016-06-28 DIAGNOSIS — Z992 Dependence on renal dialysis: Secondary | ICD-10-CM | POA: Diagnosis not present

## 2016-06-28 DIAGNOSIS — N186 End stage renal disease: Secondary | ICD-10-CM | POA: Diagnosis not present

## 2016-06-28 DIAGNOSIS — E1022 Type 1 diabetes mellitus with diabetic chronic kidney disease: Secondary | ICD-10-CM | POA: Diagnosis not present

## 2016-06-29 DIAGNOSIS — N2581 Secondary hyperparathyroidism of renal origin: Secondary | ICD-10-CM | POA: Diagnosis not present

## 2016-06-29 DIAGNOSIS — E1122 Type 2 diabetes mellitus with diabetic chronic kidney disease: Secondary | ICD-10-CM | POA: Diagnosis not present

## 2016-06-29 DIAGNOSIS — D631 Anemia in chronic kidney disease: Secondary | ICD-10-CM | POA: Diagnosis not present

## 2016-06-29 DIAGNOSIS — N186 End stage renal disease: Secondary | ICD-10-CM | POA: Diagnosis not present

## 2016-07-01 DIAGNOSIS — E1122 Type 2 diabetes mellitus with diabetic chronic kidney disease: Secondary | ICD-10-CM | POA: Diagnosis not present

## 2016-07-01 DIAGNOSIS — D631 Anemia in chronic kidney disease: Secondary | ICD-10-CM | POA: Diagnosis not present

## 2016-07-01 DIAGNOSIS — N186 End stage renal disease: Secondary | ICD-10-CM | POA: Diagnosis not present

## 2016-07-01 DIAGNOSIS — N2581 Secondary hyperparathyroidism of renal origin: Secondary | ICD-10-CM | POA: Diagnosis not present

## 2016-07-04 DIAGNOSIS — E1122 Type 2 diabetes mellitus with diabetic chronic kidney disease: Secondary | ICD-10-CM | POA: Diagnosis not present

## 2016-07-04 DIAGNOSIS — D631 Anemia in chronic kidney disease: Secondary | ICD-10-CM | POA: Diagnosis not present

## 2016-07-04 DIAGNOSIS — N186 End stage renal disease: Secondary | ICD-10-CM | POA: Diagnosis not present

## 2016-07-04 DIAGNOSIS — N2581 Secondary hyperparathyroidism of renal origin: Secondary | ICD-10-CM | POA: Diagnosis not present

## 2016-07-05 DIAGNOSIS — H3343 Traction detachment of retina, bilateral: Secondary | ICD-10-CM | POA: Diagnosis not present

## 2016-07-05 DIAGNOSIS — E113513 Type 2 diabetes mellitus with proliferative diabetic retinopathy with macular edema, bilateral: Secondary | ICD-10-CM | POA: Diagnosis not present

## 2016-07-05 DIAGNOSIS — H2511 Age-related nuclear cataract, right eye: Secondary | ICD-10-CM | POA: Diagnosis not present

## 2016-07-06 DIAGNOSIS — D631 Anemia in chronic kidney disease: Secondary | ICD-10-CM | POA: Diagnosis not present

## 2016-07-06 DIAGNOSIS — N186 End stage renal disease: Secondary | ICD-10-CM | POA: Diagnosis not present

## 2016-07-06 DIAGNOSIS — E1122 Type 2 diabetes mellitus with diabetic chronic kidney disease: Secondary | ICD-10-CM | POA: Diagnosis not present

## 2016-07-06 DIAGNOSIS — N2581 Secondary hyperparathyroidism of renal origin: Secondary | ICD-10-CM | POA: Diagnosis not present

## 2016-07-08 DIAGNOSIS — E1122 Type 2 diabetes mellitus with diabetic chronic kidney disease: Secondary | ICD-10-CM | POA: Diagnosis not present

## 2016-07-08 DIAGNOSIS — N2581 Secondary hyperparathyroidism of renal origin: Secondary | ICD-10-CM | POA: Diagnosis not present

## 2016-07-08 DIAGNOSIS — N186 End stage renal disease: Secondary | ICD-10-CM | POA: Diagnosis not present

## 2016-07-08 DIAGNOSIS — D631 Anemia in chronic kidney disease: Secondary | ICD-10-CM | POA: Diagnosis not present

## 2016-07-11 DIAGNOSIS — N2581 Secondary hyperparathyroidism of renal origin: Secondary | ICD-10-CM | POA: Diagnosis not present

## 2016-07-11 DIAGNOSIS — D631 Anemia in chronic kidney disease: Secondary | ICD-10-CM | POA: Diagnosis not present

## 2016-07-11 DIAGNOSIS — E1122 Type 2 diabetes mellitus with diabetic chronic kidney disease: Secondary | ICD-10-CM | POA: Diagnosis not present

## 2016-07-11 DIAGNOSIS — N186 End stage renal disease: Secondary | ICD-10-CM | POA: Diagnosis not present

## 2016-07-13 DIAGNOSIS — N186 End stage renal disease: Secondary | ICD-10-CM | POA: Diagnosis not present

## 2016-07-13 DIAGNOSIS — E1122 Type 2 diabetes mellitus with diabetic chronic kidney disease: Secondary | ICD-10-CM | POA: Diagnosis not present

## 2016-07-13 DIAGNOSIS — N2581 Secondary hyperparathyroidism of renal origin: Secondary | ICD-10-CM | POA: Diagnosis not present

## 2016-07-13 DIAGNOSIS — D631 Anemia in chronic kidney disease: Secondary | ICD-10-CM | POA: Diagnosis not present

## 2016-07-15 DIAGNOSIS — D631 Anemia in chronic kidney disease: Secondary | ICD-10-CM | POA: Diagnosis not present

## 2016-07-15 DIAGNOSIS — E1122 Type 2 diabetes mellitus with diabetic chronic kidney disease: Secondary | ICD-10-CM | POA: Diagnosis not present

## 2016-07-15 DIAGNOSIS — N186 End stage renal disease: Secondary | ICD-10-CM | POA: Diagnosis not present

## 2016-07-15 DIAGNOSIS — N2581 Secondary hyperparathyroidism of renal origin: Secondary | ICD-10-CM | POA: Diagnosis not present

## 2016-07-18 DIAGNOSIS — D631 Anemia in chronic kidney disease: Secondary | ICD-10-CM | POA: Diagnosis not present

## 2016-07-18 DIAGNOSIS — N186 End stage renal disease: Secondary | ICD-10-CM | POA: Diagnosis not present

## 2016-07-18 DIAGNOSIS — N2581 Secondary hyperparathyroidism of renal origin: Secondary | ICD-10-CM | POA: Diagnosis not present

## 2016-07-18 DIAGNOSIS — E1122 Type 2 diabetes mellitus with diabetic chronic kidney disease: Secondary | ICD-10-CM | POA: Diagnosis not present

## 2016-07-19 ENCOUNTER — Ambulatory Visit (INDEPENDENT_AMBULATORY_CARE_PROVIDER_SITE_OTHER): Payer: Medicare Other | Admitting: Internal Medicine

## 2016-07-19 ENCOUNTER — Other Ambulatory Visit: Payer: Self-pay | Admitting: Internal Medicine

## 2016-07-19 ENCOUNTER — Encounter: Payer: Self-pay | Admitting: Internal Medicine

## 2016-07-19 VITALS — BP 132/74 | HR 80 | Temp 97.3°F | Resp 16 | Ht 66.0 in | Wt 147.0 lb

## 2016-07-19 DIAGNOSIS — E559 Vitamin D deficiency, unspecified: Secondary | ICD-10-CM

## 2016-07-19 DIAGNOSIS — E1122 Type 2 diabetes mellitus with diabetic chronic kidney disease: Secondary | ICD-10-CM | POA: Diagnosis not present

## 2016-07-19 DIAGNOSIS — Z136 Encounter for screening for cardiovascular disorders: Secondary | ICD-10-CM | POA: Diagnosis not present

## 2016-07-19 DIAGNOSIS — Z1212 Encounter for screening for malignant neoplasm of rectum: Secondary | ICD-10-CM

## 2016-07-19 DIAGNOSIS — I10 Essential (primary) hypertension: Secondary | ICD-10-CM | POA: Diagnosis not present

## 2016-07-19 DIAGNOSIS — Z992 Dependence on renal dialysis: Secondary | ICD-10-CM | POA: Diagnosis not present

## 2016-07-19 DIAGNOSIS — Z79899 Other long term (current) drug therapy: Secondary | ICD-10-CM

## 2016-07-19 DIAGNOSIS — Z1211 Encounter for screening for malignant neoplasm of colon: Secondary | ICD-10-CM

## 2016-07-19 DIAGNOSIS — N186 End stage renal disease: Secondary | ICD-10-CM | POA: Diagnosis not present

## 2016-07-19 DIAGNOSIS — E113513 Type 2 diabetes mellitus with proliferative diabetic retinopathy with macular edema, bilateral: Secondary | ICD-10-CM

## 2016-07-19 DIAGNOSIS — E039 Hypothyroidism, unspecified: Secondary | ICD-10-CM | POA: Diagnosis not present

## 2016-07-19 DIAGNOSIS — E782 Mixed hyperlipidemia: Secondary | ICD-10-CM | POA: Diagnosis not present

## 2016-07-19 LAB — HEPATIC FUNCTION PANEL
ALT: 11 U/L (ref 6–29)
AST: 13 U/L (ref 10–35)
Albumin: 4 g/dL (ref 3.6–5.1)
Alkaline Phosphatase: 50 U/L (ref 33–130)
BILIRUBIN DIRECT: 0 mg/dL (ref <=0.2)
BILIRUBIN TOTAL: 0.3 mg/dL (ref 0.2–1.2)
Indirect Bilirubin: 0.3 mg/dL (ref 0.2–1.2)
Total Protein: 7 g/dL (ref 6.1–8.1)

## 2016-07-19 LAB — CBC WITH DIFFERENTIAL/PLATELET
Basophils Absolute: 0 cells/uL (ref 0–200)
Basophils Relative: 0 %
EOS PCT: 2 %
Eosinophils Absolute: 136 cells/uL (ref 15–500)
HCT: 33.6 % — ABNORMAL LOW (ref 35.0–45.0)
Hemoglobin: 10.7 g/dL — ABNORMAL LOW (ref 11.7–15.5)
LYMPHS PCT: 37 %
Lymphs Abs: 2516 cells/uL (ref 850–3900)
MCH: 31.4 pg (ref 27.0–33.0)
MCHC: 31.8 g/dL — AB (ref 32.0–36.0)
MCV: 98.5 fL (ref 80.0–100.0)
MONOS PCT: 8 %
MPV: 11.3 fL (ref 7.5–12.5)
Monocytes Absolute: 544 cells/uL (ref 200–950)
NEUTROS PCT: 53 %
Neutro Abs: 3604 cells/uL (ref 1500–7800)
Platelets: 254 10*3/uL (ref 140–400)
RBC: 3.41 MIL/uL — AB (ref 3.80–5.10)
RDW: 15.9 % — AB (ref 11.0–15.0)
WBC: 6.8 10*3/uL (ref 3.8–10.8)

## 2016-07-19 LAB — BASIC METABOLIC PANEL WITH GFR
BUN: 49 mg/dL — ABNORMAL HIGH (ref 7–25)
CALCIUM: 9.7 mg/dL (ref 8.6–10.4)
CO2: 31 mmol/L (ref 20–31)
Chloride: 99 mmol/L (ref 98–110)
Creat: 8.17 mg/dL — ABNORMAL HIGH (ref 0.50–0.99)
GFR, EST AFRICAN AMERICAN: 5 mL/min — AB (ref >=60)
GFR, Est Non African American: 5 mL/min — ABNORMAL LOW (ref >=60)
Glucose, Bld: 209 mg/dL — ABNORMAL HIGH (ref 65–99)
Potassium: 5.5 mmol/L — ABNORMAL HIGH (ref 3.5–5.3)
SODIUM: 144 mmol/L (ref 135–146)

## 2016-07-19 LAB — LIPID PANEL
CHOL/HDL RATIO: 3.5 ratio (ref <5.0)
CHOLESTEROL: 177 mg/dL (ref <200)
HDL: 51 mg/dL (ref >50)
LDL Cholesterol: 81 mg/dL (ref <100)
Triglycerides: 227 mg/dL — ABNORMAL HIGH (ref <150)
VLDL: 45 mg/dL — ABNORMAL HIGH (ref <30)

## 2016-07-19 LAB — MAGNESIUM: MAGNESIUM: 2.5 mg/dL (ref 1.5–2.5)

## 2016-07-19 NOTE — Patient Instructions (Signed)

## 2016-07-19 NOTE — Progress Notes (Signed)
[facility] Unk Pinto, M.D.    Uvaldo Bristle. Silverio Lay, P.A.-C      Starlyn Skeans, P.A.-C  [facility]                [address] [place] [address] Telephone [phone] Telefax [phone]  Comprehensive Evaluation &  Examination     This very nice 66 y.o. [name] presents for a comprehensive evaluation and management of multiple medical co-morbidities.  Patient has been followed for HTN, T2_NIDDM w/ESRD, Hyperlipidemia and Vitamin D Deficiency.      HTN predates since 1998. Patient's BP has been controlled at home and patient denies any cardiac symptoms as chest pain, palpitations, shortness of breath, dizziness or ankle swelling. Today's BP is at goal - 132/74.      Patient's hyperlipidemia is controlled with diet and medications. Patient denies myalgias or other medication SE's. Last lipids were at goal: Lab Results  Component Value Date   CHOL 190 09/29/2015   HDL 57 09/29/2015   LDLCALC 90 09/29/2015   TRIG 214 (H) 09/29/2015   CHOLHDL 3.3 09/29/2015      Patient has T2_NIDDM (1988) w/ESRD (followed by Dr Justin Mend) and w/Dialysis started Nov 2016. With he declining renal functions and repeated hypoglycemic reactions she was able to taper off of her insulin. Patient denies reactive hypoglycemic symptoms, diabetic polys, or paresthesias. Patient is essentially blind from diabetic retinopathy.  Last A1c was not at goal: Lab Results  Component Value Date   HGBA1C 6.4 (H) 09/29/2015      Patient has hx/o Hashimoto's Dz and has been on Thyroid replacement age 14 in 25.  Finally, patient has history of Vitamin D Deficiency ("25" in 2016) and last Vitamin D was still very low: Lab Results  Component Value Date   VD25OH 33 09/29/2015   Current Outpatient Prescriptions on File Prior to Visit  Medication Sig  . VITAMIN D 42353 UNITS  Take 50,000 Units by mouth. With dialysis  . VITAMIN B 12  tab Take 1,000 mcg by mouth daily. With dialysis  . ezetimibe  10 MG Take 10 mg by mouth daily.  Marland Kitchen levothyroxine  150 MCG Take 150 mcg by mouth daily before breakfast.  . Iron 65 mg  1 daily with dialysis  . SENSIPAR 30 MG  Take 1 tablet by mouth daily.   Allergies  Allergen Reactions  . Hydralazine Shortness Of Breath    Chest pain fatigue  . Azor [Amlodipine-Olmesartan] Other (See Comments)    Sharp chest pain  Sharp chest pain  Chest pain  . Hydrochlorothiazide W-Triamterene   . Labetalol     Per patient, caused foot pain  . Lisinopril Other (See Comments)    Severe radiating foot pain.  . Metformin Itching    Loss of bowel control, general sick feeling  . Metoclopramide Other (See Comments)  . Other Other (See Comments)    "contractions in throat" Diazide pt states cause throat to swell.  . Minoxidil Rash    Choking, neck contractions fatigue  . Spironolactone Other (See Comments) and Rash    Severe radiating pain in feet Severe radiating pain in feet.  . Statins Rash    Fatigue Cause fatigue   Past Medical History:  Diagnosis Date  . Allergy   . Blood transfusion without reported diagnosis   . Cataract    bilateral  .  Diabetes mellitus without complication ([place])   . Diabetic retinopathy ([place])   . ESRD (end stage renal disease) ([place])   . Hashimoto's disease   . Hyperlipidemia   . Hypertension   . Hypothyroid   . Loss of vision    both eyes, no vision left eye and little in left    Health Maintenance  Topic Date Due  . FOOT EXAM  08/23/1961  . URINE MICROALBUMIN  08/23/1961  . TETANUS/TDAP  08/24/1970  . HEMOGLOBIN A1C  03/30/2016  . OPHTHALMOLOGY EXAM  03/08/2017  . MAMMOGRAM  11/10/2017  . PAP SMEAR  11/29/2018  . COLONOSCOPY  08/22/2025  . INFLUENZA VACCINE  Completed  . Hepatitis C Screening  Completed  . HIV Screening  Completed   Immunization History  Administered Date(s) Administered  . Influenza Whole 04/23/2007  .  Pneumococcal-Unspecified 01/05/2010   Past Surgical History:  Procedure Laterality Date  . ABDOMINAL HYSTERECTOMY    . COLONOSCOPY    . EYE SURGERY    . KNEE SURGERY     Family History  Problem Relation Age of Onset  . Breast cancer Sister   . CAD      No family history  . Colon polyps Maternal Aunt   . Breast cancer Maternal Aunt   . Colon polyps Maternal Uncle   . Breast cancer Maternal Grandmother   . Colon cancer Neg Hx    Social History  Substance Use Topics  . Smoking status: Never Smoker  . Smokeless tobacco: Never Used  . Alcohol use No    ROS Constitutional: Denies fever, chills, weight loss/gain, headaches, insomnia,  night sweats, and change in appetite. Does c/o fatigue. Eyes: Denies redness, blurred vision, diplopia, discharge, itchy, watery eyes.  ENT: Denies discharge, congestion, post nasal drip, epistaxis, sore throat, earache, hearing loss, dental pain, Tinnitus, Vertigo, Sinus pain, snoring.  Cardio: Denies chest pain, palpitations, irregular heartbeat, syncope, dyspnea, diaphoresis, orthopnea, PND, claudication, edema Respiratory: denies cough, dyspnea, DOE, pleurisy, hoarseness, laryngitis, wheezing.  Gastrointestinal: Denies dysphagia, heartburn, reflux, water brash, pain, cramps, nausea, vomiting, bloating, diarrhea, constipation, hematemesis, melena, hematochezia, jaundice, hemorrhoids Genitourinary: Denies dysuria, frequency, urgency, nocturia, hesitancy, discharge, hematuria, flank pain Breast: Breast lumps, nipple discharge, bleeding.  Musculoskeletal: Denies arthralgia, myalgia, stiffness, Jt. Swelling, pain, limp, and strain/sprain. Denies falls. Skin: Denies puritis, rash, hives, warts, acne, eczema, changing in skin lesion Neuro: No weakness, tremor, incoordination, spasms, paresthesia, pain Psychiatric: Denies confusion, memory loss, sensory loss. Denies Depression. Endocrine: Denies change in weight, skin, hair change, nocturia, and paresthesia,  diabetic polys, visual blurring, hyper / hypo glycemic episodes.  Heme/Lymph: No excessive bleeding, bruising, enlarged lymph nodes.  Physical Exam  BP 132/74   Pulse 80   Temp 97.3 F (36.3 C)   Resp 16   Ht 5\' 6"  (1.676 m)   Wt 147 lb (66.7 kg)   LMP  (LMP Unknown)   BMI 23.73 kg/m   General Appearance: Well nourished and in no apparent distress.  Eyes: PERRLA, EOMs, conjunctiva no swelling or erythema, mod cataracts bilat obscuring adequate fundi exam.  Sinuses: No frontal/maxillary tenderness ENT/Mouth: EACs patent / TMs  nl. Nares clear without erythema, swelling, mucoid exudates. Oral hygiene is good. No erythema, swelling, or exudate. Tongue normal, non-obstructing. Tonsils not swollen or erythematous. Hearing normal.  Neck: Supple, thyroid normal. No bruits, nodes or JVD. Respiratory: Respiratory effort normal.  BS equal and clear bilateral without rales, rhonci, wheezing or stridor. Cardio: Heart sounds are normal with regular rate and rhythm  and no murmurs, rubs or gallops. Peripheral pulses are normal and equal bilaterally without edema. No aortic or femoral bruits. Chest: symmetric with normal excursions and percussion. Breasts: Symmetric, without lumps, nipple discharge, retractions, or fibrocystic changes.  Abdomen: Flat, soft with bowel sounds active. Nontender, no guarding, rebound, hernias, masses, or organomegaly.  Lymphatics: Non tender without lymphadenopathy.  Genitourinary:  Musculoskeletal: Full ROM all peripheral extremities, joint stability, 5/5 strength, and normal gait. Skin: Warm and dry without rashes, lesions, cyanosis, clubbing or  ecchymosis.  Neuro: Cranial nerves intact, reflexes equal bilaterally. Normal muscle tone, no cerebellar symptoms. Sensation intact to touch and sl decreased to vibratory and Monofilament in a stocking distribution to the feet at the ankle level. Pysch: Alert and oriented X 3, normal affect, Insight and Judgment appropriate.    Assessment and Plan  1. Essential hypertension  - EKG 24-OXBD - BASIC METABOLIC PANEL WITH GFR - Magnesium - CBC with Differential/Platelet  2. Mixed hyperlipidemia  - EKG 12-Lead - Hepatic function panel - Lipid panel  3. Type 2 diabetes mellitus with chronic kidney disease on chronic dialysis, unspecified long term insulin use status (HCC)  - EKG 12-Lead - HM DIABETES FOOT EXAM - LOW EXTREMITY NEUR EXAM DOCUM - Hemoglobin A1c - Insulin, random  4. Vitamin D deficiency  - VITAMIN D 25 Hydroxy  5. Proliferative diabetic retinopathy of both eyes with macular edema associated with type 2 diabetes mellitus ([place])   6. Screening for ischemic heart disease  - EKG 12-Lead  7. Screening for colorectal cancer  - POC Hemoccult Bld/Stl   8. Medication management  - BASIC METABOLIC PANEL WITH GFR - Hepatic function panel - Magnesium - Lipid panel - Hemoglobin A1c - Insulin, random - VITAMIN D 25 Hydroxy  - CBC with Differential/Platelet  9. End stage renal disease on dialysis due to type 2 diabetes mellitus ([place])        Continue prudent diet as discussed, weight control, BP monitoring, regular exercise, and medications. Discussed med's effects and SE's. Screening labs and tests as requested with regular follow-up as recommended. Over 40 minutes of exam, counseling, chart review and high complex critical decision making was performed.

## 2016-07-20 DIAGNOSIS — E1122 Type 2 diabetes mellitus with diabetic chronic kidney disease: Secondary | ICD-10-CM | POA: Diagnosis not present

## 2016-07-20 DIAGNOSIS — N2581 Secondary hyperparathyroidism of renal origin: Secondary | ICD-10-CM | POA: Diagnosis not present

## 2016-07-20 DIAGNOSIS — D631 Anemia in chronic kidney disease: Secondary | ICD-10-CM | POA: Diagnosis not present

## 2016-07-20 DIAGNOSIS — N186 End stage renal disease: Secondary | ICD-10-CM | POA: Diagnosis not present

## 2016-07-20 LAB — TSH: TSH: 1.42 m[IU]/L

## 2016-07-20 LAB — HEMOGLOBIN A1C
Hgb A1c MFr Bld: 6.4 % — ABNORMAL HIGH (ref <5.7)
Mean Plasma Glucose: 137 mg/dL

## 2016-07-20 LAB — INSULIN, RANDOM: Insulin: 17.9 u[IU]/mL (ref 2.0–19.6)

## 2016-07-20 LAB — VITAMIN D 25 HYDROXY (VIT D DEFICIENCY, FRACTURES): VIT D 25 HYDROXY: 29 ng/mL — AB (ref 30–100)

## 2016-07-22 DIAGNOSIS — N186 End stage renal disease: Secondary | ICD-10-CM | POA: Diagnosis not present

## 2016-07-22 DIAGNOSIS — D631 Anemia in chronic kidney disease: Secondary | ICD-10-CM | POA: Diagnosis not present

## 2016-07-22 DIAGNOSIS — E1122 Type 2 diabetes mellitus with diabetic chronic kidney disease: Secondary | ICD-10-CM | POA: Diagnosis not present

## 2016-07-22 DIAGNOSIS — N2581 Secondary hyperparathyroidism of renal origin: Secondary | ICD-10-CM | POA: Diagnosis not present

## 2016-07-25 DIAGNOSIS — E1122 Type 2 diabetes mellitus with diabetic chronic kidney disease: Secondary | ICD-10-CM | POA: Diagnosis not present

## 2016-07-25 DIAGNOSIS — D631 Anemia in chronic kidney disease: Secondary | ICD-10-CM | POA: Diagnosis not present

## 2016-07-25 DIAGNOSIS — N2581 Secondary hyperparathyroidism of renal origin: Secondary | ICD-10-CM | POA: Diagnosis not present

## 2016-07-25 DIAGNOSIS — N186 End stage renal disease: Secondary | ICD-10-CM | POA: Diagnosis not present

## 2016-07-27 DIAGNOSIS — D631 Anemia in chronic kidney disease: Secondary | ICD-10-CM | POA: Diagnosis not present

## 2016-07-27 DIAGNOSIS — N186 End stage renal disease: Secondary | ICD-10-CM | POA: Diagnosis not present

## 2016-07-27 DIAGNOSIS — N2581 Secondary hyperparathyroidism of renal origin: Secondary | ICD-10-CM | POA: Diagnosis not present

## 2016-07-27 DIAGNOSIS — E1122 Type 2 diabetes mellitus with diabetic chronic kidney disease: Secondary | ICD-10-CM | POA: Diagnosis not present

## 2016-07-29 DIAGNOSIS — E1022 Type 1 diabetes mellitus with diabetic chronic kidney disease: Secondary | ICD-10-CM | POA: Diagnosis not present

## 2016-07-29 DIAGNOSIS — Z992 Dependence on renal dialysis: Secondary | ICD-10-CM | POA: Diagnosis not present

## 2016-07-29 DIAGNOSIS — E1122 Type 2 diabetes mellitus with diabetic chronic kidney disease: Secondary | ICD-10-CM | POA: Diagnosis not present

## 2016-07-29 DIAGNOSIS — N2581 Secondary hyperparathyroidism of renal origin: Secondary | ICD-10-CM | POA: Diagnosis not present

## 2016-07-29 DIAGNOSIS — D631 Anemia in chronic kidney disease: Secondary | ICD-10-CM | POA: Diagnosis not present

## 2016-07-29 DIAGNOSIS — N186 End stage renal disease: Secondary | ICD-10-CM | POA: Diagnosis not present

## 2016-08-01 DIAGNOSIS — D631 Anemia in chronic kidney disease: Secondary | ICD-10-CM | POA: Diagnosis not present

## 2016-08-01 DIAGNOSIS — N2581 Secondary hyperparathyroidism of renal origin: Secondary | ICD-10-CM | POA: Diagnosis not present

## 2016-08-01 DIAGNOSIS — N186 End stage renal disease: Secondary | ICD-10-CM | POA: Diagnosis not present

## 2016-08-01 DIAGNOSIS — E1122 Type 2 diabetes mellitus with diabetic chronic kidney disease: Secondary | ICD-10-CM | POA: Diagnosis not present

## 2016-08-03 DIAGNOSIS — E1122 Type 2 diabetes mellitus with diabetic chronic kidney disease: Secondary | ICD-10-CM | POA: Diagnosis not present

## 2016-08-03 DIAGNOSIS — N2581 Secondary hyperparathyroidism of renal origin: Secondary | ICD-10-CM | POA: Diagnosis not present

## 2016-08-03 DIAGNOSIS — N186 End stage renal disease: Secondary | ICD-10-CM | POA: Diagnosis not present

## 2016-08-03 DIAGNOSIS — D631 Anemia in chronic kidney disease: Secondary | ICD-10-CM | POA: Diagnosis not present

## 2016-08-05 DIAGNOSIS — E1122 Type 2 diabetes mellitus with diabetic chronic kidney disease: Secondary | ICD-10-CM | POA: Diagnosis not present

## 2016-08-05 DIAGNOSIS — N186 End stage renal disease: Secondary | ICD-10-CM | POA: Diagnosis not present

## 2016-08-05 DIAGNOSIS — N2581 Secondary hyperparathyroidism of renal origin: Secondary | ICD-10-CM | POA: Diagnosis not present

## 2016-08-05 DIAGNOSIS — D631 Anemia in chronic kidney disease: Secondary | ICD-10-CM | POA: Diagnosis not present

## 2016-08-07 DIAGNOSIS — H2511 Age-related nuclear cataract, right eye: Secondary | ICD-10-CM | POA: Diagnosis not present

## 2016-08-08 DIAGNOSIS — D631 Anemia in chronic kidney disease: Secondary | ICD-10-CM | POA: Diagnosis not present

## 2016-08-08 DIAGNOSIS — N186 End stage renal disease: Secondary | ICD-10-CM | POA: Diagnosis not present

## 2016-08-08 DIAGNOSIS — E1122 Type 2 diabetes mellitus with diabetic chronic kidney disease: Secondary | ICD-10-CM | POA: Diagnosis not present

## 2016-08-08 DIAGNOSIS — N2581 Secondary hyperparathyroidism of renal origin: Secondary | ICD-10-CM | POA: Diagnosis not present

## 2016-08-10 DIAGNOSIS — N2581 Secondary hyperparathyroidism of renal origin: Secondary | ICD-10-CM | POA: Diagnosis not present

## 2016-08-10 DIAGNOSIS — N186 End stage renal disease: Secondary | ICD-10-CM | POA: Diagnosis not present

## 2016-08-10 DIAGNOSIS — E1122 Type 2 diabetes mellitus with diabetic chronic kidney disease: Secondary | ICD-10-CM | POA: Diagnosis not present

## 2016-08-10 DIAGNOSIS — D631 Anemia in chronic kidney disease: Secondary | ICD-10-CM | POA: Diagnosis not present

## 2016-08-12 DIAGNOSIS — N2581 Secondary hyperparathyroidism of renal origin: Secondary | ICD-10-CM | POA: Diagnosis not present

## 2016-08-12 DIAGNOSIS — N186 End stage renal disease: Secondary | ICD-10-CM | POA: Diagnosis not present

## 2016-08-12 DIAGNOSIS — E1122 Type 2 diabetes mellitus with diabetic chronic kidney disease: Secondary | ICD-10-CM | POA: Diagnosis not present

## 2016-08-12 DIAGNOSIS — D631 Anemia in chronic kidney disease: Secondary | ICD-10-CM | POA: Diagnosis not present

## 2016-08-15 DIAGNOSIS — N186 End stage renal disease: Secondary | ICD-10-CM | POA: Diagnosis not present

## 2016-08-15 DIAGNOSIS — E1122 Type 2 diabetes mellitus with diabetic chronic kidney disease: Secondary | ICD-10-CM | POA: Diagnosis not present

## 2016-08-15 DIAGNOSIS — N2581 Secondary hyperparathyroidism of renal origin: Secondary | ICD-10-CM | POA: Diagnosis not present

## 2016-08-15 DIAGNOSIS — D631 Anemia in chronic kidney disease: Secondary | ICD-10-CM | POA: Diagnosis not present

## 2016-08-17 DIAGNOSIS — D631 Anemia in chronic kidney disease: Secondary | ICD-10-CM | POA: Diagnosis not present

## 2016-08-17 DIAGNOSIS — N186 End stage renal disease: Secondary | ICD-10-CM | POA: Diagnosis not present

## 2016-08-17 DIAGNOSIS — N2581 Secondary hyperparathyroidism of renal origin: Secondary | ICD-10-CM | POA: Diagnosis not present

## 2016-08-17 DIAGNOSIS — E1122 Type 2 diabetes mellitus with diabetic chronic kidney disease: Secondary | ICD-10-CM | POA: Diagnosis not present

## 2016-08-19 DIAGNOSIS — N2581 Secondary hyperparathyroidism of renal origin: Secondary | ICD-10-CM | POA: Diagnosis not present

## 2016-08-19 DIAGNOSIS — N186 End stage renal disease: Secondary | ICD-10-CM | POA: Diagnosis not present

## 2016-08-19 DIAGNOSIS — D631 Anemia in chronic kidney disease: Secondary | ICD-10-CM | POA: Diagnosis not present

## 2016-08-19 DIAGNOSIS — E1122 Type 2 diabetes mellitus with diabetic chronic kidney disease: Secondary | ICD-10-CM | POA: Diagnosis not present

## 2016-08-22 DIAGNOSIS — N2581 Secondary hyperparathyroidism of renal origin: Secondary | ICD-10-CM | POA: Diagnosis not present

## 2016-08-22 DIAGNOSIS — E1122 Type 2 diabetes mellitus with diabetic chronic kidney disease: Secondary | ICD-10-CM | POA: Diagnosis not present

## 2016-08-22 DIAGNOSIS — D631 Anemia in chronic kidney disease: Secondary | ICD-10-CM | POA: Diagnosis not present

## 2016-08-22 DIAGNOSIS — N186 End stage renal disease: Secondary | ICD-10-CM | POA: Diagnosis not present

## 2016-08-24 DIAGNOSIS — N186 End stage renal disease: Secondary | ICD-10-CM | POA: Diagnosis not present

## 2016-08-24 DIAGNOSIS — D631 Anemia in chronic kidney disease: Secondary | ICD-10-CM | POA: Diagnosis not present

## 2016-08-24 DIAGNOSIS — N2581 Secondary hyperparathyroidism of renal origin: Secondary | ICD-10-CM | POA: Diagnosis not present

## 2016-08-24 DIAGNOSIS — E1122 Type 2 diabetes mellitus with diabetic chronic kidney disease: Secondary | ICD-10-CM | POA: Diagnosis not present

## 2016-08-24 DIAGNOSIS — E11319 Type 2 diabetes mellitus with unspecified diabetic retinopathy without macular edema: Secondary | ICD-10-CM | POA: Diagnosis not present

## 2016-08-26 DIAGNOSIS — N186 End stage renal disease: Secondary | ICD-10-CM | POA: Diagnosis not present

## 2016-08-26 DIAGNOSIS — N2581 Secondary hyperparathyroidism of renal origin: Secondary | ICD-10-CM | POA: Diagnosis not present

## 2016-08-26 DIAGNOSIS — D631 Anemia in chronic kidney disease: Secondary | ICD-10-CM | POA: Diagnosis not present

## 2016-08-26 DIAGNOSIS — E1122 Type 2 diabetes mellitus with diabetic chronic kidney disease: Secondary | ICD-10-CM | POA: Diagnosis not present

## 2016-08-28 DIAGNOSIS — Z992 Dependence on renal dialysis: Secondary | ICD-10-CM | POA: Diagnosis not present

## 2016-08-28 DIAGNOSIS — E1022 Type 1 diabetes mellitus with diabetic chronic kidney disease: Secondary | ICD-10-CM | POA: Diagnosis not present

## 2016-08-28 DIAGNOSIS — N186 End stage renal disease: Secondary | ICD-10-CM | POA: Diagnosis not present

## 2016-08-29 DIAGNOSIS — D509 Iron deficiency anemia, unspecified: Secondary | ICD-10-CM | POA: Diagnosis not present

## 2016-08-29 DIAGNOSIS — E1122 Type 2 diabetes mellitus with diabetic chronic kidney disease: Secondary | ICD-10-CM | POA: Diagnosis not present

## 2016-08-29 DIAGNOSIS — N186 End stage renal disease: Secondary | ICD-10-CM | POA: Diagnosis not present

## 2016-08-29 DIAGNOSIS — N2581 Secondary hyperparathyroidism of renal origin: Secondary | ICD-10-CM | POA: Diagnosis not present

## 2016-08-31 DIAGNOSIS — N2581 Secondary hyperparathyroidism of renal origin: Secondary | ICD-10-CM | POA: Diagnosis not present

## 2016-08-31 DIAGNOSIS — E1122 Type 2 diabetes mellitus with diabetic chronic kidney disease: Secondary | ICD-10-CM | POA: Diagnosis not present

## 2016-08-31 DIAGNOSIS — D509 Iron deficiency anemia, unspecified: Secondary | ICD-10-CM | POA: Diagnosis not present

## 2016-08-31 DIAGNOSIS — N186 End stage renal disease: Secondary | ICD-10-CM | POA: Diagnosis not present

## 2016-09-02 DIAGNOSIS — D509 Iron deficiency anemia, unspecified: Secondary | ICD-10-CM | POA: Diagnosis not present

## 2016-09-02 DIAGNOSIS — E1122 Type 2 diabetes mellitus with diabetic chronic kidney disease: Secondary | ICD-10-CM | POA: Diagnosis not present

## 2016-09-02 DIAGNOSIS — N2581 Secondary hyperparathyroidism of renal origin: Secondary | ICD-10-CM | POA: Diagnosis not present

## 2016-09-02 DIAGNOSIS — N186 End stage renal disease: Secondary | ICD-10-CM | POA: Diagnosis not present

## 2016-09-04 DIAGNOSIS — E1122 Type 2 diabetes mellitus with diabetic chronic kidney disease: Secondary | ICD-10-CM | POA: Diagnosis not present

## 2016-09-04 DIAGNOSIS — N2581 Secondary hyperparathyroidism of renal origin: Secondary | ICD-10-CM | POA: Diagnosis not present

## 2016-09-04 DIAGNOSIS — N186 End stage renal disease: Secondary | ICD-10-CM | POA: Diagnosis not present

## 2016-09-04 DIAGNOSIS — D509 Iron deficiency anemia, unspecified: Secondary | ICD-10-CM | POA: Diagnosis not present

## 2016-09-05 DIAGNOSIS — Z7982 Long term (current) use of aspirin: Secondary | ICD-10-CM | POA: Diagnosis not present

## 2016-09-05 DIAGNOSIS — E785 Hyperlipidemia, unspecified: Secondary | ICD-10-CM | POA: Diagnosis not present

## 2016-09-05 DIAGNOSIS — E1122 Type 2 diabetes mellitus with diabetic chronic kidney disease: Secondary | ICD-10-CM | POA: Diagnosis not present

## 2016-09-05 DIAGNOSIS — E039 Hypothyroidism, unspecified: Secondary | ICD-10-CM | POA: Diagnosis not present

## 2016-09-05 DIAGNOSIS — H25811 Combined forms of age-related cataract, right eye: Secondary | ICD-10-CM | POA: Diagnosis not present

## 2016-09-05 DIAGNOSIS — H52221 Regular astigmatism, right eye: Secondary | ICD-10-CM | POA: Diagnosis not present

## 2016-09-05 DIAGNOSIS — Z79899 Other long term (current) drug therapy: Secondary | ICD-10-CM | POA: Diagnosis not present

## 2016-09-05 DIAGNOSIS — E113599 Type 2 diabetes mellitus with proliferative diabetic retinopathy without macular edema, unspecified eye: Secondary | ICD-10-CM | POA: Diagnosis not present

## 2016-09-05 DIAGNOSIS — I120 Hypertensive chronic kidney disease with stage 5 chronic kidney disease or end stage renal disease: Secondary | ICD-10-CM | POA: Diagnosis not present

## 2016-09-05 DIAGNOSIS — M1711 Unilateral primary osteoarthritis, right knee: Secondary | ICD-10-CM | POA: Diagnosis not present

## 2016-09-05 DIAGNOSIS — N186 End stage renal disease: Secondary | ICD-10-CM | POA: Diagnosis not present

## 2016-09-05 DIAGNOSIS — E213 Hyperparathyroidism, unspecified: Secondary | ICD-10-CM | POA: Diagnosis not present

## 2016-09-06 ENCOUNTER — Ambulatory Visit: Payer: Medicare Other | Admitting: Podiatry

## 2016-09-07 DIAGNOSIS — N2581 Secondary hyperparathyroidism of renal origin: Secondary | ICD-10-CM | POA: Diagnosis not present

## 2016-09-07 DIAGNOSIS — D509 Iron deficiency anemia, unspecified: Secondary | ICD-10-CM | POA: Diagnosis not present

## 2016-09-07 DIAGNOSIS — E1122 Type 2 diabetes mellitus with diabetic chronic kidney disease: Secondary | ICD-10-CM | POA: Diagnosis not present

## 2016-09-07 DIAGNOSIS — N186 End stage renal disease: Secondary | ICD-10-CM | POA: Diagnosis not present

## 2016-09-09 DIAGNOSIS — N186 End stage renal disease: Secondary | ICD-10-CM | POA: Diagnosis not present

## 2016-09-09 DIAGNOSIS — N2581 Secondary hyperparathyroidism of renal origin: Secondary | ICD-10-CM | POA: Diagnosis not present

## 2016-09-09 DIAGNOSIS — E1122 Type 2 diabetes mellitus with diabetic chronic kidney disease: Secondary | ICD-10-CM | POA: Diagnosis not present

## 2016-09-09 DIAGNOSIS — D509 Iron deficiency anemia, unspecified: Secondary | ICD-10-CM | POA: Diagnosis not present

## 2016-09-12 DIAGNOSIS — E1122 Type 2 diabetes mellitus with diabetic chronic kidney disease: Secondary | ICD-10-CM | POA: Diagnosis not present

## 2016-09-12 DIAGNOSIS — N2581 Secondary hyperparathyroidism of renal origin: Secondary | ICD-10-CM | POA: Diagnosis not present

## 2016-09-12 DIAGNOSIS — D509 Iron deficiency anemia, unspecified: Secondary | ICD-10-CM | POA: Diagnosis not present

## 2016-09-12 DIAGNOSIS — N186 End stage renal disease: Secondary | ICD-10-CM | POA: Diagnosis not present

## 2016-09-14 DIAGNOSIS — E1122 Type 2 diabetes mellitus with diabetic chronic kidney disease: Secondary | ICD-10-CM | POA: Diagnosis not present

## 2016-09-14 DIAGNOSIS — N2581 Secondary hyperparathyroidism of renal origin: Secondary | ICD-10-CM | POA: Diagnosis not present

## 2016-09-14 DIAGNOSIS — N186 End stage renal disease: Secondary | ICD-10-CM | POA: Diagnosis not present

## 2016-09-14 DIAGNOSIS — D509 Iron deficiency anemia, unspecified: Secondary | ICD-10-CM | POA: Diagnosis not present

## 2016-09-16 DIAGNOSIS — N186 End stage renal disease: Secondary | ICD-10-CM | POA: Diagnosis not present

## 2016-09-16 DIAGNOSIS — E1122 Type 2 diabetes mellitus with diabetic chronic kidney disease: Secondary | ICD-10-CM | POA: Diagnosis not present

## 2016-09-16 DIAGNOSIS — D509 Iron deficiency anemia, unspecified: Secondary | ICD-10-CM | POA: Diagnosis not present

## 2016-09-16 DIAGNOSIS — N2581 Secondary hyperparathyroidism of renal origin: Secondary | ICD-10-CM | POA: Diagnosis not present

## 2016-09-19 DIAGNOSIS — D509 Iron deficiency anemia, unspecified: Secondary | ICD-10-CM | POA: Diagnosis not present

## 2016-09-19 DIAGNOSIS — E1122 Type 2 diabetes mellitus with diabetic chronic kidney disease: Secondary | ICD-10-CM | POA: Diagnosis not present

## 2016-09-19 DIAGNOSIS — N186 End stage renal disease: Secondary | ICD-10-CM | POA: Diagnosis not present

## 2016-09-19 DIAGNOSIS — N2581 Secondary hyperparathyroidism of renal origin: Secondary | ICD-10-CM | POA: Diagnosis not present

## 2016-09-21 DIAGNOSIS — N186 End stage renal disease: Secondary | ICD-10-CM | POA: Diagnosis not present

## 2016-09-21 DIAGNOSIS — N2581 Secondary hyperparathyroidism of renal origin: Secondary | ICD-10-CM | POA: Diagnosis not present

## 2016-09-21 DIAGNOSIS — D509 Iron deficiency anemia, unspecified: Secondary | ICD-10-CM | POA: Diagnosis not present

## 2016-09-21 DIAGNOSIS — E1122 Type 2 diabetes mellitus with diabetic chronic kidney disease: Secondary | ICD-10-CM | POA: Diagnosis not present

## 2016-09-23 DIAGNOSIS — N2581 Secondary hyperparathyroidism of renal origin: Secondary | ICD-10-CM | POA: Diagnosis not present

## 2016-09-23 DIAGNOSIS — E1122 Type 2 diabetes mellitus with diabetic chronic kidney disease: Secondary | ICD-10-CM | POA: Diagnosis not present

## 2016-09-23 DIAGNOSIS — N186 End stage renal disease: Secondary | ICD-10-CM | POA: Diagnosis not present

## 2016-09-23 DIAGNOSIS — D509 Iron deficiency anemia, unspecified: Secondary | ICD-10-CM | POA: Diagnosis not present

## 2016-09-26 DIAGNOSIS — N2581 Secondary hyperparathyroidism of renal origin: Secondary | ICD-10-CM | POA: Diagnosis not present

## 2016-09-26 DIAGNOSIS — E1122 Type 2 diabetes mellitus with diabetic chronic kidney disease: Secondary | ICD-10-CM | POA: Diagnosis not present

## 2016-09-26 DIAGNOSIS — N186 End stage renal disease: Secondary | ICD-10-CM | POA: Diagnosis not present

## 2016-09-26 DIAGNOSIS — D509 Iron deficiency anemia, unspecified: Secondary | ICD-10-CM | POA: Diagnosis not present

## 2016-09-28 DIAGNOSIS — E1122 Type 2 diabetes mellitus with diabetic chronic kidney disease: Secondary | ICD-10-CM | POA: Diagnosis not present

## 2016-09-28 DIAGNOSIS — D509 Iron deficiency anemia, unspecified: Secondary | ICD-10-CM | POA: Diagnosis not present

## 2016-09-28 DIAGNOSIS — N2581 Secondary hyperparathyroidism of renal origin: Secondary | ICD-10-CM | POA: Diagnosis not present

## 2016-09-28 DIAGNOSIS — E1022 Type 1 diabetes mellitus with diabetic chronic kidney disease: Secondary | ICD-10-CM | POA: Diagnosis not present

## 2016-09-28 DIAGNOSIS — N186 End stage renal disease: Secondary | ICD-10-CM | POA: Diagnosis not present

## 2016-09-28 DIAGNOSIS — Z992 Dependence on renal dialysis: Secondary | ICD-10-CM | POA: Diagnosis not present

## 2016-09-30 DIAGNOSIS — D631 Anemia in chronic kidney disease: Secondary | ICD-10-CM | POA: Diagnosis not present

## 2016-09-30 DIAGNOSIS — N2581 Secondary hyperparathyroidism of renal origin: Secondary | ICD-10-CM | POA: Diagnosis not present

## 2016-09-30 DIAGNOSIS — E1122 Type 2 diabetes mellitus with diabetic chronic kidney disease: Secondary | ICD-10-CM | POA: Diagnosis not present

## 2016-09-30 DIAGNOSIS — N186 End stage renal disease: Secondary | ICD-10-CM | POA: Diagnosis not present

## 2016-10-03 DIAGNOSIS — N2581 Secondary hyperparathyroidism of renal origin: Secondary | ICD-10-CM | POA: Diagnosis not present

## 2016-10-03 DIAGNOSIS — N186 End stage renal disease: Secondary | ICD-10-CM | POA: Diagnosis not present

## 2016-10-03 DIAGNOSIS — D631 Anemia in chronic kidney disease: Secondary | ICD-10-CM | POA: Diagnosis not present

## 2016-10-03 DIAGNOSIS — E1122 Type 2 diabetes mellitus with diabetic chronic kidney disease: Secondary | ICD-10-CM | POA: Diagnosis not present

## 2016-10-05 DIAGNOSIS — N2581 Secondary hyperparathyroidism of renal origin: Secondary | ICD-10-CM | POA: Diagnosis not present

## 2016-10-05 DIAGNOSIS — E1122 Type 2 diabetes mellitus with diabetic chronic kidney disease: Secondary | ICD-10-CM | POA: Diagnosis not present

## 2016-10-05 DIAGNOSIS — D631 Anemia in chronic kidney disease: Secondary | ICD-10-CM | POA: Diagnosis not present

## 2016-10-05 DIAGNOSIS — N186 End stage renal disease: Secondary | ICD-10-CM | POA: Diagnosis not present

## 2016-10-07 DIAGNOSIS — D631 Anemia in chronic kidney disease: Secondary | ICD-10-CM | POA: Diagnosis not present

## 2016-10-07 DIAGNOSIS — E1122 Type 2 diabetes mellitus with diabetic chronic kidney disease: Secondary | ICD-10-CM | POA: Diagnosis not present

## 2016-10-07 DIAGNOSIS — N186 End stage renal disease: Secondary | ICD-10-CM | POA: Diagnosis not present

## 2016-10-07 DIAGNOSIS — N2581 Secondary hyperparathyroidism of renal origin: Secondary | ICD-10-CM | POA: Diagnosis not present

## 2016-10-10 DIAGNOSIS — N2581 Secondary hyperparathyroidism of renal origin: Secondary | ICD-10-CM | POA: Diagnosis not present

## 2016-10-10 DIAGNOSIS — D631 Anemia in chronic kidney disease: Secondary | ICD-10-CM | POA: Diagnosis not present

## 2016-10-10 DIAGNOSIS — E1122 Type 2 diabetes mellitus with diabetic chronic kidney disease: Secondary | ICD-10-CM | POA: Diagnosis not present

## 2016-10-10 DIAGNOSIS — N186 End stage renal disease: Secondary | ICD-10-CM | POA: Diagnosis not present

## 2016-10-11 ENCOUNTER — Encounter: Payer: Self-pay | Admitting: Podiatry

## 2016-10-11 ENCOUNTER — Ambulatory Visit (INDEPENDENT_AMBULATORY_CARE_PROVIDER_SITE_OTHER): Payer: Medicare Other | Admitting: Podiatry

## 2016-10-11 DIAGNOSIS — B351 Tinea unguium: Secondary | ICD-10-CM

## 2016-10-11 DIAGNOSIS — M79676 Pain in unspecified toe(s): Secondary | ICD-10-CM | POA: Diagnosis not present

## 2016-10-11 DIAGNOSIS — M79606 Pain in leg, unspecified: Secondary | ICD-10-CM

## 2016-10-11 NOTE — Progress Notes (Signed)
Patient ID: Denise Bush, female   DOB: 22-Oct-1950, 66 y.o.   MRN: [id number] Complaint:  Visit Type: Patient returns to my office for continued preventative foot care services. Complaint: Patient states" my nails have grown long and thick and become painful to walk and wear shoes" Patient has been diagnosed with DM with no foot complications. The patient presents for preventative foot care services. No changes to ROS.  She is under dialysis. Podiatric Exam: Vascular: dorsalis pedis and posterior tibial pulses are palpable bilateral. Capillary return is immediate. Temperature gradient is WNL. Skin turgor WNL  Sensorium: Diminished  Semmes Weinstein monofilament test. Normal tactile sensation bilaterally. Nail Exam: Pt has thick disfigured discolored nails with subungual debris noted bilateral entire nail hallux through fifth toenails Ulcer Exam: There is no evidence of ulcer or pre-ulcerative changes or infection. Orthopedic Exam: Muscle tone and strength are WNL. No limitations in general ROM. No crepitus or effusions noted. Foot type and digits show no abnormalities. Bony prominences are unremarkable. Skin:  Asymptomatic  Porokeratosis sub 5th met left foot. No infection or ulcers  Diagnosis:  Onychomycosis, , Pain in right toe, pain in left toes, .  Treatment & Plan Procedures and Treatment: Consent by patient was obtained for treatment procedures. The patient understood the discussion of treatment and procedures well. All questions were answered thoroughly reviewed. Debridement of mycotic and hypertrophic toenails, 1 through 5 bilateral and clearing of subungual debris. No ulceration, no infection noted.   . Return Visit-Office Procedure: Patient instructed to return to the office for a follow up visit 4 months for continued evaluation and treatment.Gardiner Barefoot DPM

## 2016-10-12 DIAGNOSIS — N2581 Secondary hyperparathyroidism of renal origin: Secondary | ICD-10-CM | POA: Diagnosis not present

## 2016-10-12 DIAGNOSIS — E1122 Type 2 diabetes mellitus with diabetic chronic kidney disease: Secondary | ICD-10-CM | POA: Diagnosis not present

## 2016-10-12 DIAGNOSIS — D631 Anemia in chronic kidney disease: Secondary | ICD-10-CM | POA: Diagnosis not present

## 2016-10-12 DIAGNOSIS — N186 End stage renal disease: Secondary | ICD-10-CM | POA: Diagnosis not present

## 2016-10-14 DIAGNOSIS — E1122 Type 2 diabetes mellitus with diabetic chronic kidney disease: Secondary | ICD-10-CM | POA: Diagnosis not present

## 2016-10-14 DIAGNOSIS — N186 End stage renal disease: Secondary | ICD-10-CM | POA: Diagnosis not present

## 2016-10-14 DIAGNOSIS — N2581 Secondary hyperparathyroidism of renal origin: Secondary | ICD-10-CM | POA: Diagnosis not present

## 2016-10-14 DIAGNOSIS — D631 Anemia in chronic kidney disease: Secondary | ICD-10-CM | POA: Diagnosis not present

## 2016-10-17 DIAGNOSIS — D631 Anemia in chronic kidney disease: Secondary | ICD-10-CM | POA: Diagnosis not present

## 2016-10-17 DIAGNOSIS — N2581 Secondary hyperparathyroidism of renal origin: Secondary | ICD-10-CM | POA: Diagnosis not present

## 2016-10-17 DIAGNOSIS — E1122 Type 2 diabetes mellitus with diabetic chronic kidney disease: Secondary | ICD-10-CM | POA: Diagnosis not present

## 2016-10-17 DIAGNOSIS — N186 End stage renal disease: Secondary | ICD-10-CM | POA: Diagnosis not present

## 2016-10-19 DIAGNOSIS — E1122 Type 2 diabetes mellitus with diabetic chronic kidney disease: Secondary | ICD-10-CM | POA: Diagnosis not present

## 2016-10-19 DIAGNOSIS — D631 Anemia in chronic kidney disease: Secondary | ICD-10-CM | POA: Diagnosis not present

## 2016-10-19 DIAGNOSIS — N186 End stage renal disease: Secondary | ICD-10-CM | POA: Diagnosis not present

## 2016-10-19 DIAGNOSIS — N2581 Secondary hyperparathyroidism of renal origin: Secondary | ICD-10-CM | POA: Diagnosis not present

## 2016-10-21 DIAGNOSIS — N186 End stage renal disease: Secondary | ICD-10-CM | POA: Diagnosis not present

## 2016-10-21 DIAGNOSIS — E1122 Type 2 diabetes mellitus with diabetic chronic kidney disease: Secondary | ICD-10-CM | POA: Diagnosis not present

## 2016-10-21 DIAGNOSIS — D631 Anemia in chronic kidney disease: Secondary | ICD-10-CM | POA: Diagnosis not present

## 2016-10-21 DIAGNOSIS — N2581 Secondary hyperparathyroidism of renal origin: Secondary | ICD-10-CM | POA: Diagnosis not present

## 2016-10-24 ENCOUNTER — Other Ambulatory Visit: Payer: Self-pay | Admitting: Internal Medicine

## 2016-10-24 DIAGNOSIS — E1122 Type 2 diabetes mellitus with diabetic chronic kidney disease: Secondary | ICD-10-CM | POA: Diagnosis not present

## 2016-10-24 DIAGNOSIS — N2581 Secondary hyperparathyroidism of renal origin: Secondary | ICD-10-CM | POA: Diagnosis not present

## 2016-10-24 DIAGNOSIS — Z1231 Encounter for screening mammogram for malignant neoplasm of breast: Secondary | ICD-10-CM

## 2016-10-24 DIAGNOSIS — D631 Anemia in chronic kidney disease: Secondary | ICD-10-CM | POA: Diagnosis not present

## 2016-10-24 DIAGNOSIS — N186 End stage renal disease: Secondary | ICD-10-CM | POA: Diagnosis not present

## 2016-10-25 ENCOUNTER — Ambulatory Visit: Payer: Self-pay | Admitting: Internal Medicine

## 2016-10-26 DIAGNOSIS — E1122 Type 2 diabetes mellitus with diabetic chronic kidney disease: Secondary | ICD-10-CM | POA: Diagnosis not present

## 2016-10-26 DIAGNOSIS — D631 Anemia in chronic kidney disease: Secondary | ICD-10-CM | POA: Diagnosis not present

## 2016-10-26 DIAGNOSIS — N186 End stage renal disease: Secondary | ICD-10-CM | POA: Diagnosis not present

## 2016-10-26 DIAGNOSIS — N2581 Secondary hyperparathyroidism of renal origin: Secondary | ICD-10-CM | POA: Diagnosis not present

## 2016-10-28 DIAGNOSIS — D631 Anemia in chronic kidney disease: Secondary | ICD-10-CM | POA: Diagnosis not present

## 2016-10-28 DIAGNOSIS — Z992 Dependence on renal dialysis: Secondary | ICD-10-CM | POA: Diagnosis not present

## 2016-10-28 DIAGNOSIS — E1122 Type 2 diabetes mellitus with diabetic chronic kidney disease: Secondary | ICD-10-CM | POA: Diagnosis not present

## 2016-10-28 DIAGNOSIS — E1022 Type 1 diabetes mellitus with diabetic chronic kidney disease: Secondary | ICD-10-CM | POA: Diagnosis not present

## 2016-10-28 DIAGNOSIS — N186 End stage renal disease: Secondary | ICD-10-CM | POA: Diagnosis not present

## 2016-10-28 DIAGNOSIS — N2581 Secondary hyperparathyroidism of renal origin: Secondary | ICD-10-CM | POA: Diagnosis not present

## 2016-10-31 DIAGNOSIS — E1122 Type 2 diabetes mellitus with diabetic chronic kidney disease: Secondary | ICD-10-CM | POA: Diagnosis not present

## 2016-10-31 DIAGNOSIS — N186 End stage renal disease: Secondary | ICD-10-CM | POA: Diagnosis not present

## 2016-10-31 DIAGNOSIS — N2581 Secondary hyperparathyroidism of renal origin: Secondary | ICD-10-CM | POA: Diagnosis not present

## 2016-11-02 DIAGNOSIS — N186 End stage renal disease: Secondary | ICD-10-CM | POA: Diagnosis not present

## 2016-11-02 DIAGNOSIS — N2581 Secondary hyperparathyroidism of renal origin: Secondary | ICD-10-CM | POA: Diagnosis not present

## 2016-11-02 DIAGNOSIS — E1122 Type 2 diabetes mellitus with diabetic chronic kidney disease: Secondary | ICD-10-CM | POA: Diagnosis not present

## 2016-11-04 DIAGNOSIS — N2581 Secondary hyperparathyroidism of renal origin: Secondary | ICD-10-CM | POA: Diagnosis not present

## 2016-11-04 DIAGNOSIS — N186 End stage renal disease: Secondary | ICD-10-CM | POA: Diagnosis not present

## 2016-11-04 DIAGNOSIS — E1122 Type 2 diabetes mellitus with diabetic chronic kidney disease: Secondary | ICD-10-CM | POA: Diagnosis not present

## 2016-11-07 DIAGNOSIS — N2581 Secondary hyperparathyroidism of renal origin: Secondary | ICD-10-CM | POA: Diagnosis not present

## 2016-11-07 DIAGNOSIS — E1122 Type 2 diabetes mellitus with diabetic chronic kidney disease: Secondary | ICD-10-CM | POA: Diagnosis not present

## 2016-11-07 DIAGNOSIS — N186 End stage renal disease: Secondary | ICD-10-CM | POA: Diagnosis not present

## 2016-11-09 DIAGNOSIS — E1122 Type 2 diabetes mellitus with diabetic chronic kidney disease: Secondary | ICD-10-CM | POA: Diagnosis not present

## 2016-11-09 DIAGNOSIS — N186 End stage renal disease: Secondary | ICD-10-CM | POA: Diagnosis not present

## 2016-11-09 DIAGNOSIS — N2581 Secondary hyperparathyroidism of renal origin: Secondary | ICD-10-CM | POA: Diagnosis not present

## 2016-11-14 DIAGNOSIS — N186 End stage renal disease: Secondary | ICD-10-CM | POA: Diagnosis not present

## 2016-11-14 DIAGNOSIS — N2581 Secondary hyperparathyroidism of renal origin: Secondary | ICD-10-CM | POA: Diagnosis not present

## 2016-11-14 DIAGNOSIS — E1122 Type 2 diabetes mellitus with diabetic chronic kidney disease: Secondary | ICD-10-CM | POA: Diagnosis not present

## 2016-11-15 ENCOUNTER — Ambulatory Visit
Admission: RE | Admit: 2016-11-15 | Discharge: 2016-11-15 | Disposition: A | Discharge status: Home / Self Care | Payer: Medicare Other | Source: Ambulatory Visit | Attending: Internal Medicine | Admitting: Internal Medicine

## 2016-11-15 DIAGNOSIS — Z1231 Encounter for screening mammogram for malignant neoplasm of breast: Secondary | ICD-10-CM

## 2016-11-16 DIAGNOSIS — N186 End stage renal disease: Secondary | ICD-10-CM | POA: Diagnosis not present

## 2016-11-16 DIAGNOSIS — E1122 Type 2 diabetes mellitus with diabetic chronic kidney disease: Secondary | ICD-10-CM | POA: Diagnosis not present

## 2016-11-16 DIAGNOSIS — N2581 Secondary hyperparathyroidism of renal origin: Secondary | ICD-10-CM | POA: Diagnosis not present

## 2016-11-18 DIAGNOSIS — E1122 Type 2 diabetes mellitus with diabetic chronic kidney disease: Secondary | ICD-10-CM | POA: Diagnosis not present

## 2016-11-18 DIAGNOSIS — N186 End stage renal disease: Secondary | ICD-10-CM | POA: Diagnosis not present

## 2016-11-18 DIAGNOSIS — N2581 Secondary hyperparathyroidism of renal origin: Secondary | ICD-10-CM | POA: Diagnosis not present

## 2016-11-21 DIAGNOSIS — N2581 Secondary hyperparathyroidism of renal origin: Secondary | ICD-10-CM | POA: Diagnosis not present

## 2016-11-21 DIAGNOSIS — E1122 Type 2 diabetes mellitus with diabetic chronic kidney disease: Secondary | ICD-10-CM | POA: Diagnosis not present

## 2016-11-21 DIAGNOSIS — N186 End stage renal disease: Secondary | ICD-10-CM | POA: Diagnosis not present

## 2016-11-22 ENCOUNTER — Encounter: Payer: Self-pay | Admitting: Internal Medicine

## 2016-11-22 ENCOUNTER — Ambulatory Visit (INDEPENDENT_AMBULATORY_CARE_PROVIDER_SITE_OTHER): Payer: Medicare Other | Admitting: Internal Medicine

## 2016-11-22 VITALS — BP 122/78 | HR 72 | Temp 97.4°F | Resp 16 | Ht 66.0 in | Wt 151.2 lb

## 2016-11-22 DIAGNOSIS — Z79899 Other long term (current) drug therapy: Secondary | ICD-10-CM | POA: Diagnosis not present

## 2016-11-22 DIAGNOSIS — Z992 Dependence on renal dialysis: Secondary | ICD-10-CM | POA: Diagnosis not present

## 2016-11-22 DIAGNOSIS — N186 End stage renal disease: Secondary | ICD-10-CM | POA: Diagnosis not present

## 2016-11-22 DIAGNOSIS — E1122 Type 2 diabetes mellitus with diabetic chronic kidney disease: Secondary | ICD-10-CM

## 2016-11-22 DIAGNOSIS — E782 Mixed hyperlipidemia: Secondary | ICD-10-CM | POA: Diagnosis not present

## 2016-11-22 DIAGNOSIS — I10 Essential (primary) hypertension: Secondary | ICD-10-CM | POA: Diagnosis not present

## 2016-11-22 DIAGNOSIS — E038 Other specified hypothyroidism: Secondary | ICD-10-CM

## 2016-11-22 DIAGNOSIS — E559 Vitamin D deficiency, unspecified: Secondary | ICD-10-CM

## 2016-11-22 DIAGNOSIS — N185 Chronic kidney disease, stage 5: Secondary | ICD-10-CM

## 2016-11-22 DIAGNOSIS — E063 Autoimmune thyroiditis: Secondary | ICD-10-CM | POA: Diagnosis not present

## 2016-11-22 LAB — HEPATIC FUNCTION PANEL
ALBUMIN: 3.9 g/dL (ref 3.6–5.1)
ALT: 12 U/L (ref 6–29)
AST: 13 U/L (ref 10–35)
Alkaline Phosphatase: 64 U/L (ref 33–130)
BILIRUBIN DIRECT: 0 mg/dL (ref <=0.2)
BILIRUBIN TOTAL: 0.4 mg/dL (ref 0.2–1.2)
Indirect Bilirubin: 0.4 mg/dL (ref 0.2–1.2)
Total Protein: 7.1 g/dL (ref 6.1–8.1)

## 2016-11-22 LAB — CBC WITH DIFFERENTIAL/PLATELET
BASOS PCT: 1 %
Basophils Absolute: 61 cells/uL (ref 0–200)
EOS PCT: 3 %
Eosinophils Absolute: 183 cells/uL (ref 15–500)
HCT: 36.9 % (ref 35.0–45.0)
Hemoglobin: 12 g/dL (ref 11.7–15.5)
LYMPHS PCT: 33 %
Lymphs Abs: 2013 cells/uL (ref 850–3900)
MCH: 32.3 pg (ref 27.0–33.0)
MCHC: 32.5 g/dL (ref 32.0–36.0)
MCV: 99.5 fL (ref 80.0–100.0)
MONOS PCT: 6 %
MPV: 11.2 fL (ref 7.5–12.5)
Monocytes Absolute: 366 cells/uL (ref 200–950)
NEUTROS ABS: 3477 {cells}/uL (ref 1500–7800)
Neutrophils Relative %: 57 %
PLATELETS: 236 10*3/uL (ref 140–400)
RBC: 3.71 MIL/uL — ABNORMAL LOW (ref 3.80–5.10)
RDW: 16.1 % — AB (ref 11.0–15.0)
WBC: 6.1 10*3/uL (ref 3.8–10.8)

## 2016-11-22 LAB — BASIC METABOLIC PANEL WITH GFR
BUN: 45 mg/dL — ABNORMAL HIGH (ref 7–25)
CHLORIDE: 95 mmol/L — AB (ref 98–110)
CO2: 31 mmol/L (ref 20–31)
CREATININE: 9.33 mg/dL — AB (ref 0.50–0.99)
Calcium: 8.9 mg/dL (ref 8.6–10.4)
GFR, EST AFRICAN AMERICAN: 5 mL/min — AB (ref >=60)
GFR, Est Non African American: 4 mL/min — ABNORMAL LOW (ref >=60)
Glucose, Bld: 190 mg/dL — ABNORMAL HIGH (ref 65–99)
POTASSIUM: 5 mmol/L (ref 3.5–5.3)
SODIUM: 142 mmol/L (ref 135–146)

## 2016-11-22 LAB — LIPID PANEL
CHOL/HDL RATIO: 4.5 ratio (ref <5.0)
Cholesterol: 216 mg/dL — ABNORMAL HIGH (ref <200)
HDL: 48 mg/dL — AB (ref >50)
LDL Cholesterol: 107 mg/dL — ABNORMAL HIGH (ref <100)
TRIGLYCERIDES: 307 mg/dL — AB (ref <150)
VLDL: 61 mg/dL — ABNORMAL HIGH (ref <30)

## 2016-11-22 NOTE — Progress Notes (Signed)
This very nice 66 y.o.female presents for 3 month follow up with Hypertension, Hyperlipidemia, T2_ DM w/ESRD on Dialysis and Vitamin D Deficiency.      Patient is treated for HTN & BP has been controlled at home. Today's BP: 122/78. Patient has had no complaints of any cardiac type chest pain, palpitations, dyspnea/orthopnea/PND, dizziness, claudication, or dependent edema.    Patient  has been on Thyroid replacement age 23 in 54 for hx/o Hashimoto's Dz.  Hyperlipidemia is controlled with diet & meds. Patient denies myalgias or other med SE's. Last Lipids were  Lab Results  Component Value Date   CHOL 177 07/19/2016   HDL 51 07/19/2016   LDLCALC 81 07/19/2016   TRIG 227 (H) 07/19/2016   CHOLHDL 3.5 07/19/2016      Also, the patient has history of T2_NIDDM (1988) followed locally by Dr Joelyn Oms and also by the [facility] program.  She has been on Dialysis since Nov 2016.  As her renal functions worsened she was able to taper/wean off her insulin and currently is on no Diabetic meds. She denies symptoms of reactive hypoglycemia, diabetic polys, paresthesias, but Patient is essentially blind from diabetic retinopathy. She is totally blind in the Lt eye , and since recent Rt CE/IOL  She has improved vision in the Rt eye to finger count and read large print w/ magnification.  Last A1c was not at goal:  Lab Results  Component Value Date   HGBA1C 6.4 (H) 07/19/2016      Further, the patient also has history of Vitamin D Deficiency ("25" in 2016) and supplements vitamin D without any suspected side-effects. Last vitamin D was still very low:   Lab Results  Component Value Date   VD25OH 101 (L) 07/19/2016   Current Outpatient Prescriptions on File Prior to Visit  Medication Sig  . aspirin EC 81 MG tablet Take 81 mg by mouth daily.  . Cholecalciferol (VITAMIN D3) 50000 UNITS CAPS Take 50,000 Units by mouth. With dialysis  . Cyanocobalamin (VITAMIN B 12 PO) Take 1,000 mcg by mouth  daily. With dialysis  . ezetimibe (ZETIA) 10 MG tablet Take 10 mg by mouth daily.  Marland Kitchen glucose blood (PRODIGY NO CODING BLOOD GLUC) test strip 1 each.  . levothyroxine (SYNTHROID, LEVOTHROID) 150 MCG tablet Take 150 mcg by mouth daily before breakfast.  . OVER THE COUNTER MEDICATION Iron 65 mg 1 daily with dialysis  . SENSIPAR 30 MG tablet Take 1 tablet by mouth daily.  . sucroferric oxyhydroxide (VELPHORO) 500 MG chewable tablet Chew 500 mg by mouth 3 (three) times daily with meals.   No current facility-administered medications on file prior to visit.    Allergies  Allergen Reactions  . Hydralazine Shortness Of Breath    Chest pain fatigue  . Azor [Amlodipine-Olmesartan] Other (See Comments)    Sharp chest pain  Sharp chest pain  Chest pain  . Hydrochlorothiazide W-Triamterene   . Labetalol     Per patient, caused foot pain  . Lisinopril Other (See Comments)    Severe radiating foot pain.  . Metformin Itching    Loss of bowel control, general sick feeling  . Metoclopramide Other (See Comments)  . Other Other (See Comments)    "contractions in throat" Diazide pt states cause throat to swell.  . Minoxidil Rash    Choking, neck contractions fatigue  . Spironolactone Other (See Comments) and Rash    Severe radiating pain in feet Severe radiating pain in  feet.  . Statins Rash    Fatigue Cause fatigue   PMHx:   Past Medical History:  Diagnosis Date  . Allergy   . Blood transfusion without reported diagnosis   . Cataract    bilateral  . Diabetes mellitus without complication ([place])   . Diabetic retinopathy ([place])   . ESRD (end stage renal disease) ([place])   . Hashimoto's disease   . Hyperlipidemia   . Hypertension   . Hypothyroid   . Loss of vision    both eyes, no vision left eye and little in left    Immunization History  Administered Date(s) Administered  . Influenza Whole 04/23/2007  . Pneumococcal-Unspecified 01/05/2010   Past Surgical History:  Procedure  Laterality Date  . ABDOMINAL HYSTERECTOMY    . BREAST BIOPSY Right 2015   benign  . COLONOSCOPY    . EYE SURGERY    . KNEE SURGERY     FHx:    Reviewed / unchanged  SHx:    Reviewed / unchanged  Systems Review:  Constitutional: Denies fever, chills, wt changes, headaches, insomnia, fatigue, night sweats, change in appetite. Eyes: Denies redness, blurred vision, diplopia, discharge, itchy, watery eyes.  ENT: Denies discharge, congestion, post nasal drip, epistaxis, sore throat, earache, hearing loss, dental pain, tinnitus, vertigo, sinus pain, snoring.  CV: Denies chest pain, palpitations, irregular heartbeat, syncope, dyspnea, diaphoresis, orthopnea, PND, claudication or edema. Respiratory: denies cough, dyspnea, DOE, pleurisy, hoarseness, laryngitis, wheezing.  Gastrointestinal: Denies dysphagia, odynophagia, heartburn, reflux, water brash, abdominal pain or cramps, nausea, vomiting, bloating, diarrhea, constipation, hematemesis, melena, hematochezia  or hemorrhoids. Genitourinary: Denies dysuria, frequency, urgency, nocturia, hesitancy, discharge, hematuria or flank pain. Musculoskeletal: Denies arthralgias, myalgias, stiffness, jt. swelling, pain, limping or strain/sprain.  Skin: Denies pruritus, rash, hives, warts, acne, eczema or change in skin lesion(s). Neuro: No weakness, tremor, incoordination, spasms, paresthesia or pain. Psychiatric: Denies confusion, memory loss or sensory loss. Endo: Denies change in weight, skin or hair change.  Heme/Lymph: No excessive bleeding, bruising or enlarged lymph nodes.  Physical Exam  BP 122/78   Pulse 72   Temp (!) 97.4 F (36.3 C)   Resp 16   Ht 5\' 6"  (1.676 m)   Wt 151 lb 3.2 oz (68.6 kg)   LMP  (LMP Unknown)   BMI 24.40 kg/m   Appears well nourished, well groomed  and in no distress.  Eyes: PERRLA, EOMs, conjunctiva no swelling or erythema. Sinuses: No frontal/maxillary tenderness ENT/Mouth: EAC's clear, TM's nl w/o erythema,  bulging. Nares clear w/o erythema, swelling, exudates. Oropharynx clear without erythema or exudates. Oral hygiene is good. Tongue normal, non obstructing. Hearing intact.  Neck: Supple. Thyroid nl. Car 2+/2+ without bruits, nodes or JVD. Chest: Respirations nl with BS clear & equal w/o rales, rhonchi, wheezing or stridor.  Cor: Heart sounds normal w/ regular rate and rhythm without sig. murmurs, gallops, clicks or rubs. Palpable thrill & bruit of Lt Brachiocephalic AVF. Peripheral pulses normal and equal  without edema.  Abdomen: Soft & bowel sounds normal. Non-tender w/o guarding, rebound, hernias, masses or organomegaly.  Lymphatics: Unremarkable.  Musculoskeletal: Full ROM all peripheral extremities, joint stability, 5/5 strength and normal gait.  Skin: Warm, dry without exposed rashes, lesions or ecchymosis apparent.  Neuro: Cranial nerves intact, reflexes equal bilaterally. Sensory-motor testing grossly intact. Tendon reflexes grossly intact.  Pysch: Alert & oriented x 3.  Insight and judgement nl & appropriate. No ideations.  Assessment and Plan:  1. Essential hypertension  - Continue medication, monitor blood  pressure at home.  - Continue DASH diet. Reminder to go to the ER if any CP,  SOB, nausea, dizziness, severe HA, changes vision/speech.  - CBC with Differential/Platelet - BASIC METABOLIC PANEL WITH GFR - Magnesium - TSH  2. Hyperlipidemia, mixed  - Continue diet/meds, exercise,& lifestyle modifications.  - Continue monitor periodic cholesterol/liver & renal functions   - Hepatic function panel - Lipid panel - TSH  3. Type 2 diabetes mellitus with stage 5 chronic kidney disease not on chronic dialysis, without long-term current use of insulin (HCC)  - Continue diet, exercise, lifestyle modifications.  - Monitor appropriate labs.  - Hemoglobin A1c - Insulin, random  4. Vitamin D deficiency  - Continue supplementation.  - VITAMIN D 25 Hydroxy  5. End stage  renal disease on dialysis due to type 2 diabetes mellitus (HCC)  - BASIC METABOLIC PANEL WITH GFR  6. Hypothyroidism due to Hashimoto's thyroiditis  - TSH  7. Medication management  - CBC with Differential/Platelet - BASIC METABOLIC PANEL WITH GFR - Hepatic function panel - Magnesium - Lipid panel - TSH - Hemoglobin A1c - Insulin, random - VITAMIN D 25 Hydroxy        Discussed  regular exercise, BP monitoring, weight control to achieve/maintain BMI less than 25 and discussed med and SE's. Recommended labs to assess and monitor clinical status with further disposition pending results of labs. Over 30 minutes of exam, counseling, chart review was performed.

## 2016-11-22 NOTE — Patient Instructions (Signed)

## 2016-11-23 ENCOUNTER — Other Ambulatory Visit: Payer: Self-pay | Admitting: *Deleted

## 2016-11-23 DIAGNOSIS — N186 End stage renal disease: Secondary | ICD-10-CM | POA: Diagnosis not present

## 2016-11-23 DIAGNOSIS — E11319 Type 2 diabetes mellitus with unspecified diabetic retinopathy without macular edema: Secondary | ICD-10-CM | POA: Diagnosis not present

## 2016-11-23 DIAGNOSIS — E1122 Type 2 diabetes mellitus with diabetic chronic kidney disease: Secondary | ICD-10-CM | POA: Diagnosis not present

## 2016-11-23 DIAGNOSIS — N2581 Secondary hyperparathyroidism of renal origin: Secondary | ICD-10-CM | POA: Diagnosis not present

## 2016-11-23 LAB — HEMOGLOBIN A1C
HEMOGLOBIN A1C: 6.9 % — AB (ref <5.7)
MEAN PLASMA GLUCOSE: 151 mg/dL

## 2016-11-23 LAB — TSH: TSH: 1.86 m[IU]/L

## 2016-11-23 LAB — VITAMIN D 25 HYDROXY (VIT D DEFICIENCY, FRACTURES): VIT D 25 HYDROXY: 27 ng/mL — AB (ref 30–100)

## 2016-11-23 LAB — MAGNESIUM: MAGNESIUM: 2.5 mg/dL (ref 1.5–2.5)

## 2016-11-23 LAB — INSULIN, RANDOM: INSULIN: 49.2 u[IU]/mL — AB (ref 2.0–19.6)

## 2016-11-23 MED ORDER — LEVOTHYROXINE SODIUM 150 MCG PO TABS
150.0000 ug | ORAL_TABLET | Freq: Every day | ORAL | 3 refills | Status: DC
Start: 2016-11-23 — End: 2017-02-21

## 2016-11-25 DIAGNOSIS — E1122 Type 2 diabetes mellitus with diabetic chronic kidney disease: Secondary | ICD-10-CM | POA: Diagnosis not present

## 2016-11-25 DIAGNOSIS — N2581 Secondary hyperparathyroidism of renal origin: Secondary | ICD-10-CM | POA: Diagnosis not present

## 2016-11-25 DIAGNOSIS — N186 End stage renal disease: Secondary | ICD-10-CM | POA: Diagnosis not present

## 2016-11-28 DIAGNOSIS — E1022 Type 1 diabetes mellitus with diabetic chronic kidney disease: Secondary | ICD-10-CM | POA: Diagnosis not present

## 2016-11-28 DIAGNOSIS — Z992 Dependence on renal dialysis: Secondary | ICD-10-CM | POA: Diagnosis not present

## 2016-11-28 DIAGNOSIS — N186 End stage renal disease: Secondary | ICD-10-CM | POA: Diagnosis not present

## 2016-11-28 DIAGNOSIS — E1122 Type 2 diabetes mellitus with diabetic chronic kidney disease: Secondary | ICD-10-CM | POA: Diagnosis not present

## 2016-11-28 DIAGNOSIS — N2581 Secondary hyperparathyroidism of renal origin: Secondary | ICD-10-CM | POA: Diagnosis not present

## 2016-11-30 DIAGNOSIS — D631 Anemia in chronic kidney disease: Secondary | ICD-10-CM | POA: Diagnosis not present

## 2016-11-30 DIAGNOSIS — Z23 Encounter for immunization: Secondary | ICD-10-CM | POA: Diagnosis not present

## 2016-11-30 DIAGNOSIS — Z005 Encounter for examination of potential donor of organ and tissue: Secondary | ICD-10-CM | POA: Diagnosis not present

## 2016-11-30 DIAGNOSIS — N186 End stage renal disease: Secondary | ICD-10-CM | POA: Diagnosis not present

## 2016-11-30 DIAGNOSIS — E1122 Type 2 diabetes mellitus with diabetic chronic kidney disease: Secondary | ICD-10-CM | POA: Diagnosis not present

## 2016-11-30 DIAGNOSIS — N2581 Secondary hyperparathyroidism of renal origin: Secondary | ICD-10-CM | POA: Diagnosis not present

## 2016-12-02 DIAGNOSIS — D631 Anemia in chronic kidney disease: Secondary | ICD-10-CM | POA: Diagnosis not present

## 2016-12-02 DIAGNOSIS — Z23 Encounter for immunization: Secondary | ICD-10-CM | POA: Diagnosis not present

## 2016-12-02 DIAGNOSIS — N2581 Secondary hyperparathyroidism of renal origin: Secondary | ICD-10-CM | POA: Diagnosis not present

## 2016-12-02 DIAGNOSIS — E1122 Type 2 diabetes mellitus with diabetic chronic kidney disease: Secondary | ICD-10-CM | POA: Diagnosis not present

## 2016-12-02 DIAGNOSIS — N186 End stage renal disease: Secondary | ICD-10-CM | POA: Diagnosis not present

## 2016-12-05 DIAGNOSIS — Z23 Encounter for immunization: Secondary | ICD-10-CM | POA: Diagnosis not present

## 2016-12-05 DIAGNOSIS — N186 End stage renal disease: Secondary | ICD-10-CM | POA: Diagnosis not present

## 2016-12-05 DIAGNOSIS — N2581 Secondary hyperparathyroidism of renal origin: Secondary | ICD-10-CM | POA: Diagnosis not present

## 2016-12-05 DIAGNOSIS — E1122 Type 2 diabetes mellitus with diabetic chronic kidney disease: Secondary | ICD-10-CM | POA: Diagnosis not present

## 2016-12-05 DIAGNOSIS — D631 Anemia in chronic kidney disease: Secondary | ICD-10-CM | POA: Diagnosis not present

## 2016-12-07 DIAGNOSIS — E1122 Type 2 diabetes mellitus with diabetic chronic kidney disease: Secondary | ICD-10-CM | POA: Diagnosis not present

## 2016-12-07 DIAGNOSIS — Z23 Encounter for immunization: Secondary | ICD-10-CM | POA: Diagnosis not present

## 2016-12-07 DIAGNOSIS — N186 End stage renal disease: Secondary | ICD-10-CM | POA: Diagnosis not present

## 2016-12-07 DIAGNOSIS — N2581 Secondary hyperparathyroidism of renal origin: Secondary | ICD-10-CM | POA: Diagnosis not present

## 2016-12-07 DIAGNOSIS — D631 Anemia in chronic kidney disease: Secondary | ICD-10-CM | POA: Diagnosis not present

## 2016-12-09 DIAGNOSIS — N2581 Secondary hyperparathyroidism of renal origin: Secondary | ICD-10-CM | POA: Diagnosis not present

## 2016-12-09 DIAGNOSIS — E1122 Type 2 diabetes mellitus with diabetic chronic kidney disease: Secondary | ICD-10-CM | POA: Diagnosis not present

## 2016-12-09 DIAGNOSIS — D631 Anemia in chronic kidney disease: Secondary | ICD-10-CM | POA: Diagnosis not present

## 2016-12-09 DIAGNOSIS — N186 End stage renal disease: Secondary | ICD-10-CM | POA: Diagnosis not present

## 2016-12-09 DIAGNOSIS — Z23 Encounter for immunization: Secondary | ICD-10-CM | POA: Diagnosis not present

## 2016-12-12 DIAGNOSIS — D631 Anemia in chronic kidney disease: Secondary | ICD-10-CM | POA: Diagnosis not present

## 2016-12-12 DIAGNOSIS — E1122 Type 2 diabetes mellitus with diabetic chronic kidney disease: Secondary | ICD-10-CM | POA: Diagnosis not present

## 2016-12-12 DIAGNOSIS — N186 End stage renal disease: Secondary | ICD-10-CM | POA: Diagnosis not present

## 2016-12-12 DIAGNOSIS — N2581 Secondary hyperparathyroidism of renal origin: Secondary | ICD-10-CM | POA: Diagnosis not present

## 2016-12-12 DIAGNOSIS — Z23 Encounter for immunization: Secondary | ICD-10-CM | POA: Diagnosis not present

## 2016-12-14 DIAGNOSIS — D631 Anemia in chronic kidney disease: Secondary | ICD-10-CM | POA: Diagnosis not present

## 2016-12-14 DIAGNOSIS — Z23 Encounter for immunization: Secondary | ICD-10-CM | POA: Diagnosis not present

## 2016-12-14 DIAGNOSIS — E1122 Type 2 diabetes mellitus with diabetic chronic kidney disease: Secondary | ICD-10-CM | POA: Diagnosis not present

## 2016-12-14 DIAGNOSIS — N2581 Secondary hyperparathyroidism of renal origin: Secondary | ICD-10-CM | POA: Diagnosis not present

## 2016-12-14 DIAGNOSIS — N186 End stage renal disease: Secondary | ICD-10-CM | POA: Diagnosis not present

## 2016-12-16 DIAGNOSIS — N186 End stage renal disease: Secondary | ICD-10-CM | POA: Diagnosis not present

## 2016-12-16 DIAGNOSIS — E1122 Type 2 diabetes mellitus with diabetic chronic kidney disease: Secondary | ICD-10-CM | POA: Diagnosis not present

## 2016-12-16 DIAGNOSIS — D631 Anemia in chronic kidney disease: Secondary | ICD-10-CM | POA: Diagnosis not present

## 2016-12-16 DIAGNOSIS — Z23 Encounter for immunization: Secondary | ICD-10-CM | POA: Diagnosis not present

## 2016-12-16 DIAGNOSIS — N2581 Secondary hyperparathyroidism of renal origin: Secondary | ICD-10-CM | POA: Diagnosis not present

## 2016-12-19 DIAGNOSIS — Z23 Encounter for immunization: Secondary | ICD-10-CM | POA: Diagnosis not present

## 2016-12-19 DIAGNOSIS — D631 Anemia in chronic kidney disease: Secondary | ICD-10-CM | POA: Diagnosis not present

## 2016-12-19 DIAGNOSIS — E1122 Type 2 diabetes mellitus with diabetic chronic kidney disease: Secondary | ICD-10-CM | POA: Diagnosis not present

## 2016-12-19 DIAGNOSIS — N2581 Secondary hyperparathyroidism of renal origin: Secondary | ICD-10-CM | POA: Diagnosis not present

## 2016-12-19 DIAGNOSIS — N186 End stage renal disease: Secondary | ICD-10-CM | POA: Diagnosis not present

## 2016-12-21 DIAGNOSIS — N186 End stage renal disease: Secondary | ICD-10-CM | POA: Diagnosis not present

## 2016-12-21 DIAGNOSIS — E1122 Type 2 diabetes mellitus with diabetic chronic kidney disease: Secondary | ICD-10-CM | POA: Diagnosis not present

## 2016-12-21 DIAGNOSIS — D631 Anemia in chronic kidney disease: Secondary | ICD-10-CM | POA: Diagnosis not present

## 2016-12-21 DIAGNOSIS — Z23 Encounter for immunization: Secondary | ICD-10-CM | POA: Diagnosis not present

## 2016-12-21 DIAGNOSIS — N2581 Secondary hyperparathyroidism of renal origin: Secondary | ICD-10-CM | POA: Diagnosis not present

## 2016-12-26 DIAGNOSIS — E1122 Type 2 diabetes mellitus with diabetic chronic kidney disease: Secondary | ICD-10-CM | POA: Diagnosis not present

## 2016-12-26 DIAGNOSIS — N2581 Secondary hyperparathyroidism of renal origin: Secondary | ICD-10-CM | POA: Diagnosis not present

## 2016-12-26 DIAGNOSIS — Z23 Encounter for immunization: Secondary | ICD-10-CM | POA: Diagnosis not present

## 2016-12-26 DIAGNOSIS — N186 End stage renal disease: Secondary | ICD-10-CM | POA: Diagnosis not present

## 2016-12-26 DIAGNOSIS — D631 Anemia in chronic kidney disease: Secondary | ICD-10-CM | POA: Diagnosis not present

## 2016-12-28 DIAGNOSIS — N2581 Secondary hyperparathyroidism of renal origin: Secondary | ICD-10-CM | POA: Diagnosis not present

## 2016-12-28 DIAGNOSIS — D631 Anemia in chronic kidney disease: Secondary | ICD-10-CM | POA: Diagnosis not present

## 2016-12-28 DIAGNOSIS — E1122 Type 2 diabetes mellitus with diabetic chronic kidney disease: Secondary | ICD-10-CM | POA: Diagnosis not present

## 2016-12-28 DIAGNOSIS — N186 End stage renal disease: Secondary | ICD-10-CM | POA: Diagnosis not present

## 2016-12-28 DIAGNOSIS — Z23 Encounter for immunization: Secondary | ICD-10-CM | POA: Diagnosis not present

## 2016-12-29 DIAGNOSIS — Z992 Dependence on renal dialysis: Secondary | ICD-10-CM | POA: Diagnosis not present

## 2016-12-29 DIAGNOSIS — E1022 Type 1 diabetes mellitus with diabetic chronic kidney disease: Secondary | ICD-10-CM | POA: Diagnosis not present

## 2016-12-29 DIAGNOSIS — N186 End stage renal disease: Secondary | ICD-10-CM | POA: Diagnosis not present

## 2016-12-30 DIAGNOSIS — D631 Anemia in chronic kidney disease: Secondary | ICD-10-CM | POA: Diagnosis not present

## 2016-12-30 DIAGNOSIS — N2581 Secondary hyperparathyroidism of renal origin: Secondary | ICD-10-CM | POA: Diagnosis not present

## 2016-12-30 DIAGNOSIS — E1122 Type 2 diabetes mellitus with diabetic chronic kidney disease: Secondary | ICD-10-CM | POA: Diagnosis not present

## 2016-12-30 DIAGNOSIS — N186 End stage renal disease: Secondary | ICD-10-CM | POA: Diagnosis not present

## 2017-01-02 DIAGNOSIS — N2581 Secondary hyperparathyroidism of renal origin: Secondary | ICD-10-CM | POA: Diagnosis not present

## 2017-01-02 DIAGNOSIS — E1122 Type 2 diabetes mellitus with diabetic chronic kidney disease: Secondary | ICD-10-CM | POA: Diagnosis not present

## 2017-01-02 DIAGNOSIS — N186 End stage renal disease: Secondary | ICD-10-CM | POA: Diagnosis not present

## 2017-01-02 DIAGNOSIS — D631 Anemia in chronic kidney disease: Secondary | ICD-10-CM | POA: Diagnosis not present

## 2017-01-04 DIAGNOSIS — E1122 Type 2 diabetes mellitus with diabetic chronic kidney disease: Secondary | ICD-10-CM | POA: Diagnosis not present

## 2017-01-04 DIAGNOSIS — D631 Anemia in chronic kidney disease: Secondary | ICD-10-CM | POA: Diagnosis not present

## 2017-01-04 DIAGNOSIS — N2581 Secondary hyperparathyroidism of renal origin: Secondary | ICD-10-CM | POA: Diagnosis not present

## 2017-01-04 DIAGNOSIS — N186 End stage renal disease: Secondary | ICD-10-CM | POA: Diagnosis not present

## 2017-01-09 DIAGNOSIS — D631 Anemia in chronic kidney disease: Secondary | ICD-10-CM | POA: Diagnosis not present

## 2017-01-09 DIAGNOSIS — N2581 Secondary hyperparathyroidism of renal origin: Secondary | ICD-10-CM | POA: Diagnosis not present

## 2017-01-09 DIAGNOSIS — N186 End stage renal disease: Secondary | ICD-10-CM | POA: Diagnosis not present

## 2017-01-09 DIAGNOSIS — E1122 Type 2 diabetes mellitus with diabetic chronic kidney disease: Secondary | ICD-10-CM | POA: Diagnosis not present

## 2017-01-10 DIAGNOSIS — E113513 Type 2 diabetes mellitus with proliferative diabetic retinopathy with macular edema, bilateral: Secondary | ICD-10-CM | POA: Diagnosis not present

## 2017-01-10 DIAGNOSIS — Z961 Presence of intraocular lens: Secondary | ICD-10-CM | POA: Diagnosis not present

## 2017-01-10 DIAGNOSIS — H3343 Traction detachment of retina, bilateral: Secondary | ICD-10-CM | POA: Diagnosis not present

## 2017-01-11 DIAGNOSIS — D631 Anemia in chronic kidney disease: Secondary | ICD-10-CM | POA: Diagnosis not present

## 2017-01-11 DIAGNOSIS — N2581 Secondary hyperparathyroidism of renal origin: Secondary | ICD-10-CM | POA: Diagnosis not present

## 2017-01-11 DIAGNOSIS — E1122 Type 2 diabetes mellitus with diabetic chronic kidney disease: Secondary | ICD-10-CM | POA: Diagnosis not present

## 2017-01-11 DIAGNOSIS — N186 End stage renal disease: Secondary | ICD-10-CM | POA: Diagnosis not present

## 2017-01-13 DIAGNOSIS — N186 End stage renal disease: Secondary | ICD-10-CM | POA: Diagnosis not present

## 2017-01-13 DIAGNOSIS — N2581 Secondary hyperparathyroidism of renal origin: Secondary | ICD-10-CM | POA: Diagnosis not present

## 2017-01-13 DIAGNOSIS — E1122 Type 2 diabetes mellitus with diabetic chronic kidney disease: Secondary | ICD-10-CM | POA: Diagnosis not present

## 2017-01-13 DIAGNOSIS — D631 Anemia in chronic kidney disease: Secondary | ICD-10-CM | POA: Diagnosis not present

## 2017-01-16 DIAGNOSIS — D631 Anemia in chronic kidney disease: Secondary | ICD-10-CM | POA: Diagnosis not present

## 2017-01-16 DIAGNOSIS — E1122 Type 2 diabetes mellitus with diabetic chronic kidney disease: Secondary | ICD-10-CM | POA: Diagnosis not present

## 2017-01-16 DIAGNOSIS — N2581 Secondary hyperparathyroidism of renal origin: Secondary | ICD-10-CM | POA: Diagnosis not present

## 2017-01-16 DIAGNOSIS — N186 End stage renal disease: Secondary | ICD-10-CM | POA: Diagnosis not present

## 2017-01-18 DIAGNOSIS — E1122 Type 2 diabetes mellitus with diabetic chronic kidney disease: Secondary | ICD-10-CM | POA: Diagnosis not present

## 2017-01-18 DIAGNOSIS — N186 End stage renal disease: Secondary | ICD-10-CM | POA: Diagnosis not present

## 2017-01-18 DIAGNOSIS — D631 Anemia in chronic kidney disease: Secondary | ICD-10-CM | POA: Diagnosis not present

## 2017-01-18 DIAGNOSIS — N2581 Secondary hyperparathyroidism of renal origin: Secondary | ICD-10-CM | POA: Diagnosis not present

## 2017-01-20 DIAGNOSIS — D631 Anemia in chronic kidney disease: Secondary | ICD-10-CM | POA: Diagnosis not present

## 2017-01-20 DIAGNOSIS — E1122 Type 2 diabetes mellitus with diabetic chronic kidney disease: Secondary | ICD-10-CM | POA: Diagnosis not present

## 2017-01-20 DIAGNOSIS — N186 End stage renal disease: Secondary | ICD-10-CM | POA: Diagnosis not present

## 2017-01-20 DIAGNOSIS — N2581 Secondary hyperparathyroidism of renal origin: Secondary | ICD-10-CM | POA: Diagnosis not present

## 2017-01-23 DIAGNOSIS — N2581 Secondary hyperparathyroidism of renal origin: Secondary | ICD-10-CM | POA: Diagnosis not present

## 2017-01-23 DIAGNOSIS — N186 End stage renal disease: Secondary | ICD-10-CM | POA: Diagnosis not present

## 2017-01-23 DIAGNOSIS — D631 Anemia in chronic kidney disease: Secondary | ICD-10-CM | POA: Diagnosis not present

## 2017-01-23 DIAGNOSIS — E1122 Type 2 diabetes mellitus with diabetic chronic kidney disease: Secondary | ICD-10-CM | POA: Diagnosis not present

## 2017-01-25 DIAGNOSIS — N2581 Secondary hyperparathyroidism of renal origin: Secondary | ICD-10-CM | POA: Diagnosis not present

## 2017-01-25 DIAGNOSIS — E1122 Type 2 diabetes mellitus with diabetic chronic kidney disease: Secondary | ICD-10-CM | POA: Diagnosis not present

## 2017-01-25 DIAGNOSIS — N186 End stage renal disease: Secondary | ICD-10-CM | POA: Diagnosis not present

## 2017-01-25 DIAGNOSIS — D631 Anemia in chronic kidney disease: Secondary | ICD-10-CM | POA: Diagnosis not present

## 2017-01-27 DIAGNOSIS — N2581 Secondary hyperparathyroidism of renal origin: Secondary | ICD-10-CM | POA: Diagnosis not present

## 2017-01-27 DIAGNOSIS — E1122 Type 2 diabetes mellitus with diabetic chronic kidney disease: Secondary | ICD-10-CM | POA: Diagnosis not present

## 2017-01-27 DIAGNOSIS — N186 End stage renal disease: Secondary | ICD-10-CM | POA: Diagnosis not present

## 2017-01-27 DIAGNOSIS — D631 Anemia in chronic kidney disease: Secondary | ICD-10-CM | POA: Diagnosis not present

## 2017-01-28 DIAGNOSIS — E1022 Type 1 diabetes mellitus with diabetic chronic kidney disease: Secondary | ICD-10-CM | POA: Diagnosis not present

## 2017-01-28 DIAGNOSIS — N186 End stage renal disease: Secondary | ICD-10-CM | POA: Diagnosis not present

## 2017-01-28 DIAGNOSIS — Z992 Dependence on renal dialysis: Secondary | ICD-10-CM | POA: Diagnosis not present

## 2017-01-30 DIAGNOSIS — D631 Anemia in chronic kidney disease: Secondary | ICD-10-CM | POA: Diagnosis not present

## 2017-01-30 DIAGNOSIS — E1122 Type 2 diabetes mellitus with diabetic chronic kidney disease: Secondary | ICD-10-CM | POA: Diagnosis not present

## 2017-01-30 DIAGNOSIS — N186 End stage renal disease: Secondary | ICD-10-CM | POA: Diagnosis not present

## 2017-01-30 DIAGNOSIS — N2581 Secondary hyperparathyroidism of renal origin: Secondary | ICD-10-CM | POA: Diagnosis not present

## 2017-02-01 DIAGNOSIS — E1122 Type 2 diabetes mellitus with diabetic chronic kidney disease: Secondary | ICD-10-CM | POA: Diagnosis not present

## 2017-02-01 DIAGNOSIS — D631 Anemia in chronic kidney disease: Secondary | ICD-10-CM | POA: Diagnosis not present

## 2017-02-01 DIAGNOSIS — N2581 Secondary hyperparathyroidism of renal origin: Secondary | ICD-10-CM | POA: Diagnosis not present

## 2017-02-01 DIAGNOSIS — N186 End stage renal disease: Secondary | ICD-10-CM | POA: Diagnosis not present

## 2017-02-03 DIAGNOSIS — N2581 Secondary hyperparathyroidism of renal origin: Secondary | ICD-10-CM | POA: Diagnosis not present

## 2017-02-03 DIAGNOSIS — N186 End stage renal disease: Secondary | ICD-10-CM | POA: Diagnosis not present

## 2017-02-06 DIAGNOSIS — E1122 Type 2 diabetes mellitus with diabetic chronic kidney disease: Secondary | ICD-10-CM | POA: Diagnosis not present

## 2017-02-06 DIAGNOSIS — D631 Anemia in chronic kidney disease: Secondary | ICD-10-CM | POA: Diagnosis not present

## 2017-02-06 DIAGNOSIS — N186 End stage renal disease: Secondary | ICD-10-CM | POA: Diagnosis not present

## 2017-02-06 DIAGNOSIS — N2581 Secondary hyperparathyroidism of renal origin: Secondary | ICD-10-CM | POA: Diagnosis not present

## 2017-02-07 ENCOUNTER — Ambulatory Visit: Payer: Self-pay | Admitting: Internal Medicine

## 2017-02-08 DIAGNOSIS — D631 Anemia in chronic kidney disease: Secondary | ICD-10-CM | POA: Diagnosis not present

## 2017-02-08 DIAGNOSIS — E1122 Type 2 diabetes mellitus with diabetic chronic kidney disease: Secondary | ICD-10-CM | POA: Diagnosis not present

## 2017-02-08 DIAGNOSIS — N186 End stage renal disease: Secondary | ICD-10-CM | POA: Diagnosis not present

## 2017-02-08 DIAGNOSIS — N2581 Secondary hyperparathyroidism of renal origin: Secondary | ICD-10-CM | POA: Diagnosis not present

## 2017-02-10 DIAGNOSIS — D631 Anemia in chronic kidney disease: Secondary | ICD-10-CM | POA: Diagnosis not present

## 2017-02-10 DIAGNOSIS — E1122 Type 2 diabetes mellitus with diabetic chronic kidney disease: Secondary | ICD-10-CM | POA: Diagnosis not present

## 2017-02-10 DIAGNOSIS — N2581 Secondary hyperparathyroidism of renal origin: Secondary | ICD-10-CM | POA: Diagnosis not present

## 2017-02-10 DIAGNOSIS — N186 End stage renal disease: Secondary | ICD-10-CM | POA: Diagnosis not present

## 2017-02-13 DIAGNOSIS — N2581 Secondary hyperparathyroidism of renal origin: Secondary | ICD-10-CM | POA: Diagnosis not present

## 2017-02-13 DIAGNOSIS — D631 Anemia in chronic kidney disease: Secondary | ICD-10-CM | POA: Diagnosis not present

## 2017-02-13 DIAGNOSIS — N186 End stage renal disease: Secondary | ICD-10-CM | POA: Diagnosis not present

## 2017-02-13 DIAGNOSIS — E1122 Type 2 diabetes mellitus with diabetic chronic kidney disease: Secondary | ICD-10-CM | POA: Diagnosis not present

## 2017-02-14 ENCOUNTER — Encounter: Payer: Self-pay | Admitting: Podiatry

## 2017-02-14 ENCOUNTER — Ambulatory Visit (INDEPENDENT_AMBULATORY_CARE_PROVIDER_SITE_OTHER): Payer: Medicare Other | Admitting: Podiatry

## 2017-02-14 DIAGNOSIS — E1040 Type 1 diabetes mellitus with diabetic neuropathy, unspecified: Secondary | ICD-10-CM

## 2017-02-14 DIAGNOSIS — B351 Tinea unguium: Secondary | ICD-10-CM

## 2017-02-14 NOTE — Progress Notes (Addendum)
Patient ID: Denise Bush, female   DOB: 05/01/1951, 66 y.o.   MRN: [id number] Complaint:  Visit Type: Patient returns to my office for continued preventative foot care services. Complaint: Patient states" my nails have grown long and thick and become painful to walk and wear shoes" Patient has been diagnosed with DM with no foot complications. The patient presents for preventative foot care services. No changes to ROS.  She is under dialysis. Podiatric Exam: Vascular: dorsalis pedis and posterior tibial pulses are palpable bilateral. Capillary return is immediate. Temperature gradient is WNL. Skin turgor WNL  Sensorium: Diminished  Semmes Weinstein monofilament test. Normal tactile sensation bilaterally. Nail Exam: Pt has thick disfigured discolored nails with subungual debris noted bilateral entire nail hallux through fifth toenails Ulcer Exam: There is no evidence of ulcer or pre-ulcerative changes or infection. Orthopedic Exam: Muscle tone and strength are WNL. No limitations in general ROM. No crepitus or effusions noted. Foot type and digits show no abnormalities. Bony prominences are unremarkable. Skin:  Asymptomatic  Porokeratosis sub 5th met left foot. No infection or ulcers  Diagnosis:  Onychomycosis, , Pain in right toe, pain in left toes, .  Treatment & Plan Procedures and Treatment: Consent by patient was obtained for treatment procedures. The patient understood the discussion of treatment and procedures well. All questions were answered thoroughly reviewed. Debridement of mycotic and hypertrophic toenails, 1 through 5 bilateral and clearing of subungual debris. No ulceration, no infection noted.   ABN signed for 2018.Marland Kitchen Return Visit-Office Procedure: Patient instructed to return to the office for a follow up visit 4 months for continued evaluation and treatment.Gardiner Barefoot DPM

## 2017-02-15 DIAGNOSIS — E1122 Type 2 diabetes mellitus with diabetic chronic kidney disease: Secondary | ICD-10-CM | POA: Diagnosis not present

## 2017-02-15 DIAGNOSIS — D631 Anemia in chronic kidney disease: Secondary | ICD-10-CM | POA: Diagnosis not present

## 2017-02-15 DIAGNOSIS — N2581 Secondary hyperparathyroidism of renal origin: Secondary | ICD-10-CM | POA: Diagnosis not present

## 2017-02-15 DIAGNOSIS — N186 End stage renal disease: Secondary | ICD-10-CM | POA: Diagnosis not present

## 2017-02-17 DIAGNOSIS — E1122 Type 2 diabetes mellitus with diabetic chronic kidney disease: Secondary | ICD-10-CM | POA: Diagnosis not present

## 2017-02-17 DIAGNOSIS — E1136 Type 2 diabetes mellitus with diabetic cataract: Secondary | ICD-10-CM | POA: Insufficient documentation

## 2017-02-17 DIAGNOSIS — Z794 Long term (current) use of insulin: Secondary | ICD-10-CM | POA: Insufficient documentation

## 2017-02-17 DIAGNOSIS — N186 End stage renal disease: Secondary | ICD-10-CM | POA: Diagnosis not present

## 2017-02-17 DIAGNOSIS — D631 Anemia in chronic kidney disease: Secondary | ICD-10-CM | POA: Diagnosis not present

## 2017-02-17 DIAGNOSIS — N2581 Secondary hyperparathyroidism of renal origin: Secondary | ICD-10-CM | POA: Diagnosis not present

## 2017-02-17 DIAGNOSIS — E119 Type 2 diabetes mellitus without complications: Secondary | ICD-10-CM | POA: Insufficient documentation

## 2017-02-17 NOTE — Progress Notes (Deleted)
MEDICARE ANNUAL WELLNESS VISIT AND FOLLOW UP  Assessment:    Over 40 minutes of exam, counseling, chart review and critical decision making was performed Future Appointments Date Time Provider [place]  02/19/2017 11:30 AM Vicie Mutters, PA-C [facility] None  03/19/2017 3:30 PM Vicie Mutters, PA-C [facility] None  06/20/2017 2:30 PM Unk Pinto, MD [facility] None  06/22/2017 10:15 AM Gardiner Barefoot, DPM [facility]  08/20/2017 2:00 PM Unk Pinto, MD [facility] None     Plan:   During the course of the visit the patient was educated and counseled about appropriate screening and preventive services including:    Pneumococcal vaccine   Prevnar 13  Influenza vaccine  Td vaccine  Screening electrocardiogram  Bone densitometry screening  Colorectal cancer screening  Diabetes screening  Glaucoma screening  Nutrition counseling   Advanced directives: requested   Subjective:  Denise Bush is a 66 y.o. female who presents for Medicare Annual Wellness Visit and 3 month follow up.  Her blood pressure {HAS HAS NOT:18834} been controlled at home, today their BP is    She {DOES_DOES IZT:24580} workout. She denies chest pain, shortness of breath, dizziness.   She {ACTION; IS/IS DXI:33825053} on cholesterol medication and denies myalgias. Her cholesterol {ACTION; IS/IS NOT:21021397} at goal. The cholesterol last visit was:   Lab Results  Component Value Date   CHOL 216 (H) 11/22/2016   HDL 48 (L) 11/22/2016   LDLCALC 107 (H) 11/22/2016   TRIG 307 (H) 11/22/2016   CHOLHDL 4.5 11/22/2016   She {Has/has not:18111} been working on diet and exercise for Diabetes with diabetic chronic kidney disease and with diabetic retinopathy; proliferative; with macular edema and with other diabetic opthalmic complication, she is on bASA, she is not on ACE/ARB, and denies hypoglycemia , polydipsia and polyuria. She has severe DM retinopathy and cataracts that  cause her to be dependendent on her husband for transportation as well as some ADLS. Last A1C was:  Lab Results  Component Value Date   HGBA1C 6.9 (H) 11/22/2016   She has ESRD due to DM x2016, follows with Dr. Justin Mend and in the process of getting a kidney transplant.  Last GFR: Lab Results  Component Value Date   GFRNONAA 4 (L) 11/22/2016   Lab Results  Component Value Date   GFRAA 5 (L) 11/22/2016   Patient is on Vitamin D supplement.   Lab Results  Component Value Date   VD25OH 27 (L) 11/22/2016     BMI is There is no height or weight on file to calculate BMI., she is working on diet and exercise. Wt Readings from Last 3 Encounters:  11/22/16 151 lb 3.2 oz (68.6 kg)  07/19/16 147 lb (66.7 kg)  05/10/16 146 lb (66.2 kg)    Medication Review: Current Outpatient Prescriptions on File Prior to Visit  Medication Sig Dispense Refill  . aspirin EC 81 MG tablet Take 81 mg by mouth daily.    . Cholecalciferol (VITAMIN D3) 50000 UNITS CAPS Take 50,000 Units by mouth. With dialysis    . Cyanocobalamin (VITAMIN B 12 PO) Take 1,000 mcg by mouth daily. With dialysis    . ezetimibe (ZETIA) 10 MG tablet Take 10 mg by mouth daily.    Marland Kitchen glucose blood (PRODIGY NO CODING BLOOD GLUC) test strip 1 each.    . levothyroxine (SYNTHROID, LEVOTHROID) 150 MCG tablet Take 1 tablet (150 mcg total) by mouth daily before breakfast. 90 tablet 3  . OVER THE COUNTER MEDICATION Iron 65 mg 1 daily  with dialysis    . SENSIPAR 30 MG tablet Take 1 tablet by mouth daily.    . sucroferric oxyhydroxide (VELPHORO) 500 MG chewable tablet Chew 500 mg by mouth 3 (three) times daily with meals.     No current facility-administered medications on file prior to visit.     Allergies  Allergen Reactions  . Hydralazine Shortness Of Breath    Chest pain fatigue  . Azor [Amlodipine-Olmesartan] Other (See Comments)    Sharp chest pain  Sharp chest pain  Chest pain  . Hydrochlorothiazide W-Triamterene   . Labetalol      Per patient, caused foot pain  . Lisinopril Other (See Comments)    Severe radiating foot pain.  . Metformin Itching    Loss of bowel control, general sick feeling  . Metoclopramide Other (See Comments)  . Other Other (See Comments)    "contractions in throat" Diazide pt states cause throat to swell.  . Minoxidil Rash    Choking, neck contractions fatigue  . Spironolactone Other (See Comments) and Rash    Severe radiating pain in feet Severe radiating pain in feet.  . Statins Rash    Fatigue Cause fatigue    Current Problems (verified) Patient Active Problem List   Diagnosis Date Noted  . BMI 26.0-26.9,adult 03/22/2015  . End stage renal disease on dialysis due to type 2 diabetes mellitus ([place]) 03/22/2015  . Arthritis, degenerative 09/05/2014  . Mixed hyperlipidemia 06/24/2014  . Vitamin D deficiency 06/24/2014  . Medication management 06/24/2014  . Diabetic proliferative retinopathy ([place]) 05/06/2012  . Hypothyroidism 11/22/2006  . Essential hypertension 11/22/2006  . Allergic rhinitis 11/22/2006    Screening Tests Immunization History  Administered Date(s) Administered  . Influenza Whole 04/23/2007  . Pneumococcal-Unspecified 01/05/2010   Preventative care: Last colonoscopy: 07/2015 Last mammogram: 10/2016 Last pap smear/pelvic exam: 2017 DEXA:/due Echo 10/2015 Stress test 07/2015  Prior vaccinations: TD or Tdap: DUE  Influenza: DUE Pneumococcal: 2011 Prevnar13: DUE Shingles/Zostavax: declines  Names of Other Physician/Practitioners you currently use: 1. [facility] here for primary care 2. ***, eye doctor, last visit *** 3. ***, dentist, last visit *** Patient Care Team: Unk Pinto, MD as PCP - General (Internal Medicine) Rexene Agent, MD as Attending Physician (Nephrology) Edrick Oh, MD as Consulting Physician (Nephrology)  SURGICAL HISTORY She  has a past surgical history that includes Eye  surgery; Abdominal hysterectomy; Knee surgery; Colonoscopy; and Breast biopsy (Right, 2015). FAMILY HISTORY Her family history includes Breast cancer in her maternal aunt, maternal aunt, maternal grandmother, and sister; CAD in her unknown relative; Colon polyps in her maternal aunt and maternal uncle. SOCIAL HISTORY She  reports that she has never smoked. She has never used smokeless tobacco. She reports that she does not drink alcohol.   MEDICARE WELLNESS OBJECTIVES: Physical activity:   Cardiac risk factors:   Depression/mood screen:   Depression screen [facility] 2/9 11/22/2016  Decreased Interest 0  Down, Depressed, Hopeless 0  PHQ - 2 Score 0    ADLs:  In your present state of health, do you have any difficulty performing the following activities: 11/22/2016 07/19/2016  Hearing? N N  Vision? Y Y  Comment legally blind Rt eye & totally blind Lt eye essentially blind except to motion activity   Difficulty concentrating or making decisions? N N  Walking or climbing stairs? Tempie Donning  Comment requires assistance due to visual impairment requires guidance (usu by husband)  Dressing or bathing? N Y  Comment -  assisted by husband  Doing errands, shopping? Tempie Donning  Comment requires transport husband transports  Some recent data might be hidden     Cognitive Testing  Alert? Yes  Normal Appearance?Yes  Oriented to person? Yes  Place? Yes   Time? Yes  Recall of three objects?  Yes  Can perform simple calculations? Yes  Displays appropriate judgment?Yes  Can read the correct time from a watch face?Yes  EOL planning:    ROS   Objective:     There were no vitals filed for this visit. There is no height or weight on file to calculate BMI.  General appearance: alert, no distress, WD/WN, female HEENT: normocephalic, sclerae anicteric, TMs pearly, nares patent, no discharge or erythema, pharynx normal Oral cavity: MMM, no lesions Neck: supple, no lymphadenopathy, no thyromegaly, no  masses Heart: RRR, normal S1, S2, no murmurs Lungs: CTA bilaterally, no wheezes, rhonchi, or rales Abdomen: +bs, soft, non tender, non distended, no masses, no hepatomegaly, no splenomegaly Musculoskeletal: nontender, no swelling, no obvious deformity Extremities: no edema, no cyanosis, no clubbing Pulses: 2+ symmetric, upper and lower extremities, normal cap refill Neurological: alert, oriented x 3, CN2-12 intact, strength normal upper extremities and lower extremities, sensation normal throughout, DTRs 2+ throughout, no cerebellar signs, gait normal Psychiatric: normal affect, behavior normal, pleasant   Medicare Attestation I have personally reviewed: The patient's medical and social history Their use of alcohol, tobacco or illicit drugs Their current medications and supplements The patient's functional ability including ADLs,fall risks, home safety risks, cognitive, and hearing and visual impairment Diet and physical activities Evidence for depression or mood disorders  The patient's weight, height, BMI, and visual acuity have been recorded in the chart.  I have made referrals, counseling, and provided education to the patient based on review of the above and I have provided the patient with a written personalized care plan for preventive services.     Vicie Mutters, PA-C   02/17/2017

## 2017-02-19 ENCOUNTER — Ambulatory Visit: Payer: Self-pay | Admitting: Physician Assistant

## 2017-02-20 DIAGNOSIS — E1122 Type 2 diabetes mellitus with diabetic chronic kidney disease: Secondary | ICD-10-CM | POA: Diagnosis not present

## 2017-02-20 DIAGNOSIS — N186 End stage renal disease: Secondary | ICD-10-CM | POA: Diagnosis not present

## 2017-02-20 DIAGNOSIS — D631 Anemia in chronic kidney disease: Secondary | ICD-10-CM | POA: Diagnosis not present

## 2017-02-20 DIAGNOSIS — N2581 Secondary hyperparathyroidism of renal origin: Secondary | ICD-10-CM | POA: Diagnosis not present

## 2017-02-20 NOTE — Progress Notes (Signed)
MEDICARE ANNUAL WELLNESS VISIT AND FOLLOW UP  Assessment:   Diagnoses and all orders for this visit:  Diabetes mellitus with cataract ([place]) -  Currently withdrawn from medications and managed by lifestyle  -  Continue follow up with opthalmology  Proliferative diabetic retinopathy with macular edema associated with type 2 diabetes mellitus, bilateral ([place]) -  Continue follow up with opthalmology  End stage renal disease on dialysis due to type 2 diabetes mellitus ([place]) -  Continue regular dialysis and follow up with Dr. Justin Mend, on transplant list  Hyperparathyroidism, secondary renal ([place]) -  Continue vitamin D supplement -  Continue follow up with nephrology  Mixed hyperlipidemia - Continue low cholesterol diet, zetia - Lipid panel  Essential hypertension - Currently well managed; continue to monitor - Magnesium  Hypothyroidism due to Hashimoto's thyroiditis - continue medications the same - reminded to take on an empty stomach 30-73mins before food.  - check TSH level  Primary osteoarthritis involving multiple joints - tylenol as needed, continue follow up with orthopedics  Vitamin D deficiency - Continue supplementation - Vit D level  Medication management  - CBC, BMP, liver panel   Body mass index (BMI) 24.0-24.9, adult - Normal, continue to monitor, encouraged DASH diet/exercise   Over 40 minutes of exam, counseling, chart review and critical decision making was performed Future Appointments Date Time Provider Door  06/20/2017 2:30 PM Unk Pinto, MD [facility] None  06/22/2017 10:15 AM Gardiner Barefoot, DPM [facility]  08/20/2017 2:00 PM Unk Pinto, MD [facility] None     Plan:   During the course of the visit the patient was educated and counseled about appropriate screening and preventive services including:    Pneumococcal vaccine   Prevnar 13  Influenza vaccine  Td vaccine  Screening electrocardiogram  Bone  densitometry screening  Colorectal cancer screening  Diabetes screening  Glaucoma screening  Nutrition counseling   Advanced directives: requested   Subjective:  Denise Bush is a 66 y.o. AA female who presents accompanied by her husband for Medicare Annual Wellness Visit and 3 month follow up. She continues to be followed closely by nephrology (ESRD on renal dialysis on kidney transplant list, followed by Dr. Justin Mend), ophthalmology for proliferative diabetic retinopathy, and podiatry q 4 months, due to previously poorly controlled T2DM. She is followed by this office for hypertension, hyperlipidemia, hypothyroid, DM and vitamin D deficiency. She reports she is doing well without concerns.   Her blood pressure has been controlled at home, today their BP is BP: 134/82  She does not workout. She denies chest pain, shortness of breath, dizziness.   She is on cholesterol medication and denies myalgias. Her cholesterol is not at goal. The cholesterol last visit was:   Lab Results  Component Value Date   CHOL 216 (H) 11/22/2016   HDL 48 (L) 11/22/2016   LDLCALC 107 (H) 11/22/2016   TRIG 307 (H) 11/22/2016   CHOLHDL 4.5 11/22/2016    She has been working on diet and exercise for diabetes, and denies hyperglycemia, hypoglycemia , nausea, paresthesia of the feet, polydipsia and polyuria. Last A1C in the office was:  Lab Results  Component Value Date   HGBA1C 6.9 (H) 11/22/2016   Last GFR: Lab Results  Component Value Date   GFRAA 5 (L) 11/22/2016   Patient is on large dose of Vitamin D supplement but remains below goal:    Lab Results  Component Value Date   VD25OH 27 (L) 11/22/2016  Medication Review: Current Outpatient Prescriptions on File Prior to Visit  Medication Sig Dispense Refill  . aspirin EC 81 MG tablet Take 81 mg by mouth daily.    . Cholecalciferol (VITAMIN D3) 50000 UNITS CAPS Take 50,000 Units by mouth. With dialysis    . ezetimibe (ZETIA) 10 MG tablet Take  10 mg by mouth daily.    Marland Kitchen glucose blood (PRODIGY NO CODING BLOOD GLUC) test strip 1 each.    . levothyroxine (SYNTHROID, LEVOTHROID) 150 MCG tablet Take 1 tablet (150 mcg total) by mouth daily before breakfast. 90 tablet 3  . OVER THE COUNTER MEDICATION Iron 65 mg 1 daily with dialysis    . SENSIPAR 30 MG tablet Take 1 tablet by mouth daily.    . sucroferric oxyhydroxide (VELPHORO) 500 MG chewable tablet Chew 500 mg by mouth 3 (three) times daily with meals.    . Cyanocobalamin (VITAMIN B 12 PO) Take 1,000 mcg by mouth daily. With dialysis    . ketorolac (ACULAR) 0.5 % ophthalmic solution 1 drop three times a day in the surgical eye x 4 weeks. Start after surgery    . ofloxacin (OCUFLOX) 0.3 % ophthalmic solution 1 drop in the surgical eye three times a day for 1 week. Start after surgery    . prednisoLONE acetate (PRED FORTE) 1 % ophthalmic suspension 1 drop in the surgical eye three times a day for 2 weeks. Start after surgery.     No current facility-administered medications on file prior to visit.     Allergies  Allergen Reactions  . Hydralazine Shortness Of Breath    Chest pain fatigue  . Azor [Amlodipine-Olmesartan] Other (See Comments)    Sharp chest pain  Sharp chest pain  Chest pain  . Hydrochlorothiazide W-Triamterene   . Labetalol     Per patient, caused foot pain  . Lisinopril Other (See Comments)    Severe radiating foot pain.  . Metformin Itching    Loss of bowel control, general sick feeling  . Metoclopramide Other (See Comments)  . Other Other (See Comments)    "contractions in throat" Diazide pt states cause throat to swell.  . Minoxidil Rash    Choking, neck contractions fatigue  . Spironolactone Other (See Comments) and Rash    Severe radiating pain in feet Severe radiating pain in feet.  . Statins Rash    Fatigue Cause fatigue    Current Problems (verified) Patient Active Problem List   Diagnosis Date Noted  . Diabetes mellitus with cataract ([place])  02/17/2017  . Hyperparathyroidism, secondary renal ([place]) 06/25/2015  . Body mass index (BMI) 24.0-24.9, adult 03/22/2015  . End stage renal disease on dialysis due to type 2 diabetes mellitus ([place]) 03/22/2015  . Arthritis, degenerative 09/05/2014  . Mixed hyperlipidemia 06/24/2014  . Vitamin D deficiency 06/24/2014  . Medication management 06/24/2014  . Diabetic proliferative retinopathy ([place]) 05/06/2012  . Hypothyroidism 11/22/2006  . Essential hypertension 11/22/2006  . Allergic rhinitis 11/22/2006    Screening Tests Immunization History  Administered Date(s) Administered  . Influenza Whole 04/23/2007  . Influenza-Unspecified 01/05/2014, 02/28/2016  . Pneumococcal-Unspecified 01/05/2010   Preventative care: Last colonoscopy: 2017 Last mammogram: 11/15/2016 - gets annually Last pap smear/pelvic exam: 2017    DEXA: Declines  Prior vaccinations: TD or Tdap:   Influenza: 2017 Pneumococcal: 2011 Prevnar13:  Shingles/Zostavax: defer/ na  Names of Other Physician/Practitioners you currently use: 1.  [facility] for primary care 2. Dr. Raylene Miyamoto - [facility], eye doctor, last  visit 01/07/2017 - following annually now 3. Dr. Autumn Patty office, dentist, last visit 06/2016, next visit next week   Patient Care Team: Unk Pinto, MD as PCP - General (Internal Medicine) Rexene Agent, MD as Attending Physician (Nephrology) Edrick Oh, MD as Consulting Physician (Nephrology) Gardiner Barefoot, DPM as Consulting Physician (Podiatry) Raylene Miyamoto Jake Samples, MD as Referring Physician (Ophthalmology)  SURGICAL HISTORY She  has a past surgical history that includes Eye surgery; Abdominal hysterectomy; Knee surgery; Colonoscopy; and Breast biopsy (Right, 2015). FAMILY HISTORY Her family history includes Breast cancer in her maternal aunt, maternal aunt, maternal grandmother, and sister; CAD in her unknown relative; Colon polyps in her maternal aunt and maternal  uncle. SOCIAL HISTORY She  reports that she has never smoked. She has never used smokeless tobacco. She reports that she does not drink alcohol.   MEDICARE WELLNESS OBJECTIVES: Physical activity: Current Exercise Habits: The patient does not participate in regular exercise at present, Exercise limited by: orthopedic condition(s);Other - see comments (Vision) Cardiac risk factors: Cardiac Risk Factors include: advanced age (>77men, >13 women);diabetes mellitus;dyslipidemia;hypertension;sedentary lifestyle Depression/mood screen:   Depression screen [facility] 2/9 02/21/2017  Decreased Interest 0  Down, Depressed, Hopeless 0  PHQ - 2 Score 0    ADLs:  In your present state of health, do you have any difficulty performing the following activities: 02/21/2017 11/22/2016  Hearing? N N  Vision? Y Y  Comment Totally blind in L, legally blind in R legally blind Rt eye & totally blind Lt eye  Difficulty concentrating or making decisions? N N  Walking or climbing stairs? Y Y  Comment Arthritis of bilateral knees, followed by ortho, no stairs in home requires assistance due to visual impairment  Dressing or bathing? N N  Comment - -  Doing errands, shopping? Tempie Donning  Comment Due to vision; but can run simple errands by herself with transportation requires Dispensing optician and eating ? N -  Comment All following assisted by husband due to vision -  Using the Toilet? N -  In the past six months, have you accidently leaked urine? N -  Do you have problems with loss of bowel control? N -  Managing your Medications? N -  Managing your Finances? N -  Housekeeping or managing your Housekeeping? N -  Some recent data might be hidden     Cognitive Testing  Alert? Yes  Normal Appearance?Yes  Oriented to person? Yes  Place? Yes   Time? Yes  Recall of three objects?  Yes  Can perform simple calculations? Yes  Displays appropriate judgment?Yes  Can read the correct time from a watch face?Yes  EOL  planning: Does Patient Have a Medical Advance Directive?: No Would patient like information on creating a medical advance directive?: Yes (MAU/Ambulatory/Procedural Areas - Information given)  Review of Systems  Constitutional: Negative for malaise/fatigue and weight loss.  HENT: Negative for hearing loss and tinnitus.   Respiratory: Negative for cough, shortness of breath and wheezing.   Cardiovascular: Negative for chest pain, palpitations, orthopnea, claudication and leg swelling.  Gastrointestinal: Negative for abdominal pain, blood in stool, constipation, diarrhea, heartburn, melena, nausea and vomiting.  Genitourinary: Negative.   Musculoskeletal: Negative for joint pain and myalgias.  Skin: Negative for rash.  Neurological: Negative for dizziness, tingling, sensory change, weakness and headaches.  Endo/Heme/Allergies: Negative for polydipsia.  Psychiatric/Behavioral: Negative.   All other systems reviewed and are negative.    Objective:     Today's Vitals   02/21/17 1420  BP: 134/82  Pulse: 75  Temp: (!) 97.5 F (36.4 C)  SpO2: 98%  Weight: 149 lb (67.6 kg)  Height: 5\' 6"  (1.676 m)   Body mass index is 24.05 kg/m.  General appearance: alert, no distress, WD/WN, female HEENT: normocephalic, sclerae anicteric, TMs pearly, nares patent, no discharge or erythema, pharynx normal, she maintains minimal vision, though can somewhat track movement.  Oral cavity: MMM, no lesions Neck: supple, no lymphadenopathy, no thyromegaly, no masses Heart: RRR, normal S1, S2, no murmurs Lungs: CTA bilaterally, no wheezes, rhonchi, or rales Abdomen: +bs, soft, non tender, non distended, no masses, no hepatomegaly, no splenomegaly Musculoskeletal: nontender, no swelling, no obvious deformity Extremities: no edema, no cyanosis, no clubbing Pulses:  symmetric, upper 2+ and lower extremities 1+, normal cap refill Neurological: alert, oriented x 3, CN2-12 intact, strength normal upper  extremities and lower extremities, sensation normal throughout, DTRs 2+ throughout, no cerebellar signs, gait slow but normal with cane Psychiatric: normal affect, behavior normal, pleasant   Medicare Attestation I have personally reviewed: The patient's medical and social history Their use of alcohol, tobacco or illicit drugs Their current medications and supplements The patient's functional ability including ADLs,fall risks, home safety risks, cognitive, and hearing and visual impairment Diet and physical activities Evidence for depression or mood disorders  The patient's weight, height, BMI, and visual acuity have been recorded in the chart.  I have made referrals, counseling, and provided education to the patient based on review of the above and I have provided the patient with a written personalized care plan for preventive services.     Izora Ribas, NP   02/21/2017

## 2017-02-21 ENCOUNTER — Encounter: Payer: Self-pay | Admitting: Adult Health

## 2017-02-21 ENCOUNTER — Ambulatory Visit (INDEPENDENT_AMBULATORY_CARE_PROVIDER_SITE_OTHER): Payer: Medicare Other | Admitting: Adult Health

## 2017-02-21 VITALS — BP 134/82 | HR 75 | Temp 97.5°F | Ht 66.0 in | Wt 149.0 lb

## 2017-02-21 DIAGNOSIS — E1122 Type 2 diabetes mellitus with diabetic chronic kidney disease: Secondary | ICD-10-CM | POA: Diagnosis not present

## 2017-02-21 DIAGNOSIS — M150 Primary generalized (osteo)arthritis: Secondary | ICD-10-CM

## 2017-02-21 DIAGNOSIS — E559 Vitamin D deficiency, unspecified: Secondary | ICD-10-CM | POA: Diagnosis not present

## 2017-02-21 DIAGNOSIS — E063 Autoimmune thyroiditis: Secondary | ICD-10-CM | POA: Diagnosis not present

## 2017-02-21 DIAGNOSIS — E782 Mixed hyperlipidemia: Secondary | ICD-10-CM

## 2017-02-21 DIAGNOSIS — Z992 Dependence on renal dialysis: Secondary | ICD-10-CM

## 2017-02-21 DIAGNOSIS — N2581 Secondary hyperparathyroidism of renal origin: Secondary | ICD-10-CM | POA: Diagnosis not present

## 2017-02-21 DIAGNOSIS — Z7189 Other specified counseling: Secondary | ICD-10-CM | POA: Diagnosis not present

## 2017-02-21 DIAGNOSIS — Z6824 Body mass index (BMI) 24.0-24.9, adult: Secondary | ICD-10-CM

## 2017-02-21 DIAGNOSIS — E1136 Type 2 diabetes mellitus with diabetic cataract: Secondary | ICD-10-CM

## 2017-02-21 DIAGNOSIS — I10 Essential (primary) hypertension: Secondary | ICD-10-CM | POA: Diagnosis not present

## 2017-02-21 DIAGNOSIS — Z79899 Other long term (current) drug therapy: Secondary | ICD-10-CM

## 2017-02-21 DIAGNOSIS — Z0000 Encounter for general adult medical examination without abnormal findings: Secondary | ICD-10-CM

## 2017-02-21 DIAGNOSIS — J309 Allergic rhinitis, unspecified: Secondary | ICD-10-CM | POA: Diagnosis not present

## 2017-02-21 DIAGNOSIS — R6889 Other general symptoms and signs: Secondary | ICD-10-CM

## 2017-02-21 DIAGNOSIS — N186 End stage renal disease: Secondary | ICD-10-CM

## 2017-02-21 DIAGNOSIS — Z0001 Encounter for general adult medical examination with abnormal findings: Secondary | ICD-10-CM

## 2017-02-21 DIAGNOSIS — M159 Polyosteoarthritis, unspecified: Secondary | ICD-10-CM

## 2017-02-21 DIAGNOSIS — E038 Other specified hypothyroidism: Secondary | ICD-10-CM

## 2017-02-21 DIAGNOSIS — E113519 Type 2 diabetes mellitus with proliferative diabetic retinopathy with macular edema, unspecified eye: Secondary | ICD-10-CM

## 2017-02-21 MED ORDER — LEVOTHYROXINE SODIUM 150 MCG PO TABS: 150.0000 ug | ORAL_TABLET | Freq: Every day | ORAL | 3 refills | Status: DC

## 2017-02-21 MED ORDER — EZETIMIBE 10 MG PO TABS: 10.0000 mg | ORAL_TABLET | Freq: Every day | ORAL | 3 refills | Status: AC

## 2017-02-21 NOTE — Patient Instructions (Signed)
Advance Directive Advance directives are legal documents that let you make choices ahead of time about your health care and medical treatment in case you become unable to communicate for yourself. Advance directives are a way for you to communicate your wishes to family, friends, and health care providers. This can help convey your decisions about end-of-life care if you become unable to communicate. Discussing and writing advance directives should happen over time rather than all at once. Advance directives can be changed depending on your situation and what you want, even after you have signed the advance directives. If you do not have an advance directive, some states assign family decision makers to act on your behalf based on how closely you are related to them. Each state has its own laws regarding advance directives. You may want to check with your health care provider, attorney, or state representative about the laws in your state. There are different types of advance directives, such as:  Medical power of attorney.  Living will.  Do not resuscitate (DNR) or do not attempt resuscitation (DNAR) order. Health care proxy and medical power of attorney A health care proxy, also called a health care agent, is a person who is appointed to make medical decisions for you in cases in which you are unable to make the decisions yourself. Generally, people choose someone they know well and trust to represent their preferences. Make sure to ask this person for an agreement to act as your proxy. A proxy may have to exercise judgment in the event of a medical decision for which your wishes are not known. A medical power of attorney is a legal document that names your health care proxy. Depending on the laws in your state, after the document is written, it may also need to be:  Signed.  Notarized.  Dated.  Copied.  Witnessed.  Incorporated into your medical record. You may also want to appoint  someone to manage your financial affairs in a situation in which you are unable to do so. This is called a durable power of attorney for finances. It is a separate legal document from the durable power of attorney for health care. You may choose the same person or someone different from your health care proxy to act as your agent in financial matters. If you do not appoint a proxy, or if there is a concern that the proxy is not acting in your best interests, a court-appointed guardian may be designated to act on your behalf. Living will A living will is a set of instructions documenting your wishes about medical care when you cannot express them yourself. Health care providers should keep a copy of your living will in your medical record. You may want to give a copy to family members or friends. To alert caregivers in case of an emergency, you can place a card in your wallet to let them know that you have a living will and where they can find it. A living will is used if you become:  Terminally ill.  Incapacitated.  Unable to communicate or make decisions. Items to consider in your living will include:  The use or non-use of life-sustaining equipment, such as dialysis machines and breathing machines (ventilators).  A DNR or DNAR order, which is the instruction not to use cardiopulmonary resuscitation (CPR) if breathing or heartbeat stops.  The use or non-use of tube feeding.  Withholding of food and fluids.  Comfort (palliative) care when the goal becomes comfort rather than   a cure.  Organ and tissue donation. A living will does not give instructions for distributing your money and property if you should pass away. It is recommended that you seek the advice of a lawyer when writing a will. Decisions about taxes, beneficiaries, and asset distribution will be legally binding. This process can relieve your family and friends of any concerns surrounding disputes or questions that may come up about  the distribution of your assets. DNR or DNAR A DNR or DNAR order is a request not to have CPR in the event that your heart stops beating or you stop breathing. If a DNR or DNAR order has not been made and shared, a health care provider will try to help any patient whose heart has stopped or who has stopped breathing. If you plan to have surgery, talk with your health care provider about how your DNR or DNAR order will be followed if problems occur. Summary  Advance directives are the legal documents that allow you to make choices ahead of time about your health care and medical treatment in case you become unable to communicate for yourself.  The process of discussing and writing advance directives should happen over time. You can change the advance directives, even after you have signed them.  Advance directives include DNR or DNAR orders, living wills, and designating an agent as your medical power of attorney. This information is not intended to replace advice given to you by your health care provider. Make sure you discuss any questions you have with your health care provider. Document Released: 07/25/2007 Document Revised: 03/06/2016 Document Reviewed: 03/06/2016 Elsevier Interactive Patient Education  2017 [employer]  

## 2017-02-22 DIAGNOSIS — N186 End stage renal disease: Secondary | ICD-10-CM | POA: Diagnosis not present

## 2017-02-22 DIAGNOSIS — E11319 Type 2 diabetes mellitus with unspecified diabetic retinopathy without macular edema: Secondary | ICD-10-CM | POA: Diagnosis not present

## 2017-02-22 DIAGNOSIS — E1122 Type 2 diabetes mellitus with diabetic chronic kidney disease: Secondary | ICD-10-CM | POA: Diagnosis not present

## 2017-02-22 DIAGNOSIS — D631 Anemia in chronic kidney disease: Secondary | ICD-10-CM | POA: Diagnosis not present

## 2017-02-22 DIAGNOSIS — N2581 Secondary hyperparathyroidism of renal origin: Secondary | ICD-10-CM | POA: Diagnosis not present

## 2017-02-22 LAB — BASIC METABOLIC PANEL WITH GFR
BUN/Creatinine Ratio: 5 (calc) — ABNORMAL LOW (ref 6–22)
BUN: 49 mg/dL — AB (ref 7–25)
CALCIUM: 8.9 mg/dL (ref 8.6–10.4)
CHLORIDE: 93 mmol/L — AB (ref 98–110)
CO2: 33 mmol/L — AB (ref 20–32)
Creat: 9.69 mg/dL — ABNORMAL HIGH (ref 0.50–0.99)
GFR, EST NON AFRICAN AMERICAN: 4 mL/min/{1.73_m2} — AB (ref >=60)
GFR, Est African American: 4 mL/min/{1.73_m2} — ABNORMAL LOW (ref >=60)
Glucose, Bld: 238 mg/dL — ABNORMAL HIGH (ref 65–99)
POTASSIUM: 5 mmol/L (ref 3.5–5.3)
Sodium: 141 mmol/L (ref 135–146)

## 2017-02-22 LAB — CBC WITH DIFFERENTIAL/PLATELET
Basophils Absolute: 50 cells/uL (ref 0–200)
Basophils Relative: 0.9 %
EOS PCT: 2.9 %
Eosinophils Absolute: 162 cells/uL (ref 15–500)
HCT: 36.1 % (ref 35.0–45.0)
Hemoglobin: 11.9 g/dL (ref 11.7–15.5)
LYMPHS ABS: 1669 {cells}/uL (ref 850–3900)
MCH: 31.2 pg (ref 27.0–33.0)
MCHC: 33 g/dL (ref 32.0–36.0)
MCV: 94.5 fL (ref 80.0–100.0)
MONOS PCT: 10.3 %
MPV: 11.8 fL (ref 7.5–12.5)
Neutro Abs: 3142 cells/uL (ref 1500–7800)
Neutrophils Relative %: 56.1 %
PLATELETS: 215 10*3/uL (ref 140–400)
RBC: 3.82 10*6/uL (ref 3.80–5.10)
RDW: 14.5 % (ref 11.0–15.0)
TOTAL LYMPHOCYTE: 29.8 %
WBC mixed population: 577 cells/uL (ref 200–950)
WBC: 5.6 10*3/uL (ref 3.8–10.8)

## 2017-02-22 LAB — MAGNESIUM: MAGNESIUM: 2.3 mg/dL (ref 1.5–2.5)

## 2017-02-22 LAB — HEMOGLOBIN A1C
EAG (MMOL/L): 7.9 (calc)
Hgb A1c MFr Bld: 6.6 % of total Hgb — ABNORMAL HIGH (ref <5.7)
Mean Plasma Glucose: 143 (calc)

## 2017-02-22 LAB — HEPATIC FUNCTION PANEL
AG Ratio: 1.3 (calc) (ref 1.0–2.5)
ALBUMIN MSPROF: 4.2 g/dL (ref 3.6–5.1)
ALT: 8 U/L (ref 6–29)
AST: 12 U/L (ref 10–35)
Alkaline phosphatase (APISO): 56 U/L (ref 33–130)
BILIRUBIN TOTAL: 0.5 mg/dL (ref 0.2–1.2)
Bilirubin, Direct: 0.1 mg/dL (ref 0.0–0.2)
GLOBULIN: 3.2 g/dL (ref 1.9–3.7)
Indirect Bilirubin: 0.4 mg/dL (calc) (ref 0.2–1.2)
Total Protein: 7.4 g/dL (ref 6.1–8.1)

## 2017-02-22 LAB — LIPID PANEL
CHOL/HDL RATIO: 3.8 (calc) (ref <5.0)
CHOLESTEROL: 202 mg/dL — AB (ref <200)
HDL: 53 mg/dL (ref >50)
LDL Cholesterol (Calc): 119 mg/dL (calc) — ABNORMAL HIGH
NON-HDL CHOLESTEROL (CALC): 149 mg/dL — AB (ref <130)
TRIGLYCERIDES: 180 mg/dL — AB (ref <150)

## 2017-02-22 LAB — TSH: TSH: 1.14 mIU/L (ref 0.40–4.50)

## 2017-02-24 DIAGNOSIS — E1122 Type 2 diabetes mellitus with diabetic chronic kidney disease: Secondary | ICD-10-CM | POA: Diagnosis not present

## 2017-02-24 DIAGNOSIS — D631 Anemia in chronic kidney disease: Secondary | ICD-10-CM | POA: Diagnosis not present

## 2017-02-24 DIAGNOSIS — N186 End stage renal disease: Secondary | ICD-10-CM | POA: Diagnosis not present

## 2017-02-24 DIAGNOSIS — N2581 Secondary hyperparathyroidism of renal origin: Secondary | ICD-10-CM | POA: Diagnosis not present

## 2017-02-27 DIAGNOSIS — D631 Anemia in chronic kidney disease: Secondary | ICD-10-CM | POA: Diagnosis not present

## 2017-02-27 DIAGNOSIS — E1122 Type 2 diabetes mellitus with diabetic chronic kidney disease: Secondary | ICD-10-CM | POA: Diagnosis not present

## 2017-02-27 DIAGNOSIS — N186 End stage renal disease: Secondary | ICD-10-CM | POA: Diagnosis not present

## 2017-02-27 DIAGNOSIS — N2581 Secondary hyperparathyroidism of renal origin: Secondary | ICD-10-CM | POA: Diagnosis not present

## 2017-02-28 DIAGNOSIS — N186 End stage renal disease: Secondary | ICD-10-CM | POA: Diagnosis not present

## 2017-02-28 DIAGNOSIS — E1022 Type 1 diabetes mellitus with diabetic chronic kidney disease: Secondary | ICD-10-CM | POA: Diagnosis not present

## 2017-02-28 DIAGNOSIS — Z992 Dependence on renal dialysis: Secondary | ICD-10-CM | POA: Diagnosis not present

## 2017-03-01 DIAGNOSIS — N186 End stage renal disease: Secondary | ICD-10-CM | POA: Diagnosis not present

## 2017-03-01 DIAGNOSIS — Z23 Encounter for immunization: Secondary | ICD-10-CM | POA: Diagnosis not present

## 2017-03-01 DIAGNOSIS — E1122 Type 2 diabetes mellitus with diabetic chronic kidney disease: Secondary | ICD-10-CM | POA: Diagnosis not present

## 2017-03-01 DIAGNOSIS — D509 Iron deficiency anemia, unspecified: Secondary | ICD-10-CM | POA: Diagnosis not present

## 2017-03-01 DIAGNOSIS — N2581 Secondary hyperparathyroidism of renal origin: Secondary | ICD-10-CM | POA: Diagnosis not present

## 2017-03-01 DIAGNOSIS — D631 Anemia in chronic kidney disease: Secondary | ICD-10-CM | POA: Diagnosis not present

## 2017-03-03 DIAGNOSIS — N186 End stage renal disease: Secondary | ICD-10-CM | POA: Diagnosis not present

## 2017-03-03 DIAGNOSIS — D631 Anemia in chronic kidney disease: Secondary | ICD-10-CM | POA: Diagnosis not present

## 2017-03-03 DIAGNOSIS — Z23 Encounter for immunization: Secondary | ICD-10-CM | POA: Diagnosis not present

## 2017-03-03 DIAGNOSIS — E1122 Type 2 diabetes mellitus with diabetic chronic kidney disease: Secondary | ICD-10-CM | POA: Diagnosis not present

## 2017-03-03 DIAGNOSIS — D509 Iron deficiency anemia, unspecified: Secondary | ICD-10-CM | POA: Diagnosis not present

## 2017-03-03 DIAGNOSIS — N2581 Secondary hyperparathyroidism of renal origin: Secondary | ICD-10-CM | POA: Diagnosis not present

## 2017-03-06 DIAGNOSIS — E1122 Type 2 diabetes mellitus with diabetic chronic kidney disease: Secondary | ICD-10-CM | POA: Diagnosis not present

## 2017-03-06 DIAGNOSIS — N2581 Secondary hyperparathyroidism of renal origin: Secondary | ICD-10-CM | POA: Diagnosis not present

## 2017-03-06 DIAGNOSIS — N186 End stage renal disease: Secondary | ICD-10-CM | POA: Diagnosis not present

## 2017-03-06 DIAGNOSIS — Z23 Encounter for immunization: Secondary | ICD-10-CM | POA: Diagnosis not present

## 2017-03-06 DIAGNOSIS — D509 Iron deficiency anemia, unspecified: Secondary | ICD-10-CM | POA: Diagnosis not present

## 2017-03-06 DIAGNOSIS — D631 Anemia in chronic kidney disease: Secondary | ICD-10-CM | POA: Diagnosis not present

## 2017-03-08 ENCOUNTER — Other Ambulatory Visit: Payer: Self-pay | Admitting: Internal Medicine

## 2017-03-08 DIAGNOSIS — D509 Iron deficiency anemia, unspecified: Secondary | ICD-10-CM | POA: Diagnosis not present

## 2017-03-08 DIAGNOSIS — Z23 Encounter for immunization: Secondary | ICD-10-CM | POA: Diagnosis not present

## 2017-03-08 DIAGNOSIS — N2581 Secondary hyperparathyroidism of renal origin: Secondary | ICD-10-CM | POA: Diagnosis not present

## 2017-03-08 DIAGNOSIS — E1122 Type 2 diabetes mellitus with diabetic chronic kidney disease: Secondary | ICD-10-CM | POA: Diagnosis not present

## 2017-03-08 DIAGNOSIS — N186 End stage renal disease: Secondary | ICD-10-CM | POA: Diagnosis not present

## 2017-03-08 DIAGNOSIS — D631 Anemia in chronic kidney disease: Secondary | ICD-10-CM | POA: Diagnosis not present

## 2017-03-08 NOTE — Progress Notes (Signed)
Denise Bush H. Female , 66 y.o. , [birth year]  Care Everywhere Result Report HIV-1 And HIV-2 Antibody & Antigen4/27/2018 [place] Component Name Value Ref Range  HIV 1&2 Antibody/Antigen NonReactive NonReactive   Specimen  Blood  Result Narrative   Includes testing for HIV 1&2 Antibody and HIV-1 Antigen.  In children less than 17 years of age, HIV antibody results require clinical correlation. Neither (+) or (-) results are absolutely conclusive

## 2017-03-10 DIAGNOSIS — D509 Iron deficiency anemia, unspecified: Secondary | ICD-10-CM | POA: Diagnosis not present

## 2017-03-10 DIAGNOSIS — N2581 Secondary hyperparathyroidism of renal origin: Secondary | ICD-10-CM | POA: Diagnosis not present

## 2017-03-10 DIAGNOSIS — E1122 Type 2 diabetes mellitus with diabetic chronic kidney disease: Secondary | ICD-10-CM | POA: Diagnosis not present

## 2017-03-10 DIAGNOSIS — D631 Anemia in chronic kidney disease: Secondary | ICD-10-CM | POA: Diagnosis not present

## 2017-03-10 DIAGNOSIS — N186 End stage renal disease: Secondary | ICD-10-CM | POA: Diagnosis not present

## 2017-03-10 DIAGNOSIS — Z23 Encounter for immunization: Secondary | ICD-10-CM | POA: Diagnosis not present

## 2017-03-12 ENCOUNTER — Other Ambulatory Visit (INDEPENDENT_AMBULATORY_CARE_PROVIDER_SITE_OTHER): Payer: Medicare Other

## 2017-03-12 DIAGNOSIS — Z114 Encounter for screening for human immunodeficiency virus [HIV]: Secondary | ICD-10-CM

## 2017-03-13 DIAGNOSIS — N2581 Secondary hyperparathyroidism of renal origin: Secondary | ICD-10-CM | POA: Diagnosis not present

## 2017-03-13 DIAGNOSIS — D631 Anemia in chronic kidney disease: Secondary | ICD-10-CM | POA: Diagnosis not present

## 2017-03-13 DIAGNOSIS — E1122 Type 2 diabetes mellitus with diabetic chronic kidney disease: Secondary | ICD-10-CM | POA: Diagnosis not present

## 2017-03-13 DIAGNOSIS — D509 Iron deficiency anemia, unspecified: Secondary | ICD-10-CM | POA: Diagnosis not present

## 2017-03-13 DIAGNOSIS — N186 End stage renal disease: Secondary | ICD-10-CM | POA: Diagnosis not present

## 2017-03-13 DIAGNOSIS — Z23 Encounter for immunization: Secondary | ICD-10-CM | POA: Diagnosis not present

## 2017-03-13 LAB — HIV ANTIBODY (ROUTINE TESTING W REFLEX): HIV: NONREACTIVE

## 2017-03-14 NOTE — Progress Notes (Signed)
LAB RESULTS HAVE BEEN FAXED TO [facility].

## 2017-03-14 NOTE — Progress Notes (Signed)
Pt aware of lab results & voiced understanding of those results.

## 2017-03-15 DIAGNOSIS — Z23 Encounter for immunization: Secondary | ICD-10-CM | POA: Diagnosis not present

## 2017-03-15 DIAGNOSIS — D631 Anemia in chronic kidney disease: Secondary | ICD-10-CM | POA: Diagnosis not present

## 2017-03-15 DIAGNOSIS — D509 Iron deficiency anemia, unspecified: Secondary | ICD-10-CM | POA: Diagnosis not present

## 2017-03-15 DIAGNOSIS — N186 End stage renal disease: Secondary | ICD-10-CM | POA: Diagnosis not present

## 2017-03-15 DIAGNOSIS — E1122 Type 2 diabetes mellitus with diabetic chronic kidney disease: Secondary | ICD-10-CM | POA: Diagnosis not present

## 2017-03-15 DIAGNOSIS — N2581 Secondary hyperparathyroidism of renal origin: Secondary | ICD-10-CM | POA: Diagnosis not present

## 2017-03-17 DIAGNOSIS — D631 Anemia in chronic kidney disease: Secondary | ICD-10-CM | POA: Diagnosis not present

## 2017-03-17 DIAGNOSIS — N2581 Secondary hyperparathyroidism of renal origin: Secondary | ICD-10-CM | POA: Diagnosis not present

## 2017-03-17 DIAGNOSIS — E1122 Type 2 diabetes mellitus with diabetic chronic kidney disease: Secondary | ICD-10-CM | POA: Diagnosis not present

## 2017-03-17 DIAGNOSIS — N186 End stage renal disease: Secondary | ICD-10-CM | POA: Diagnosis not present

## 2017-03-17 DIAGNOSIS — Z23 Encounter for immunization: Secondary | ICD-10-CM | POA: Diagnosis not present

## 2017-03-17 DIAGNOSIS — D509 Iron deficiency anemia, unspecified: Secondary | ICD-10-CM | POA: Diagnosis not present

## 2017-03-19 ENCOUNTER — Ambulatory Visit: Payer: Self-pay | Admitting: Physician Assistant

## 2017-03-19 DIAGNOSIS — N186 End stage renal disease: Secondary | ICD-10-CM | POA: Diagnosis not present

## 2017-03-19 DIAGNOSIS — N2581 Secondary hyperparathyroidism of renal origin: Secondary | ICD-10-CM | POA: Diagnosis not present

## 2017-03-23 DIAGNOSIS — N2581 Secondary hyperparathyroidism of renal origin: Secondary | ICD-10-CM | POA: Diagnosis not present

## 2017-03-23 DIAGNOSIS — N186 End stage renal disease: Secondary | ICD-10-CM | POA: Diagnosis not present

## 2017-03-27 DIAGNOSIS — Z23 Encounter for immunization: Secondary | ICD-10-CM | POA: Diagnosis not present

## 2017-03-27 DIAGNOSIS — N2581 Secondary hyperparathyroidism of renal origin: Secondary | ICD-10-CM | POA: Diagnosis not present

## 2017-03-27 DIAGNOSIS — E1122 Type 2 diabetes mellitus with diabetic chronic kidney disease: Secondary | ICD-10-CM | POA: Diagnosis not present

## 2017-03-27 DIAGNOSIS — D631 Anemia in chronic kidney disease: Secondary | ICD-10-CM | POA: Diagnosis not present

## 2017-03-27 DIAGNOSIS — D509 Iron deficiency anemia, unspecified: Secondary | ICD-10-CM | POA: Diagnosis not present

## 2017-03-27 DIAGNOSIS — N186 End stage renal disease: Secondary | ICD-10-CM | POA: Diagnosis not present

## 2017-03-29 DIAGNOSIS — D509 Iron deficiency anemia, unspecified: Secondary | ICD-10-CM | POA: Diagnosis not present

## 2017-03-29 DIAGNOSIS — E1122 Type 2 diabetes mellitus with diabetic chronic kidney disease: Secondary | ICD-10-CM | POA: Diagnosis not present

## 2017-03-29 DIAGNOSIS — D631 Anemia in chronic kidney disease: Secondary | ICD-10-CM | POA: Diagnosis not present

## 2017-03-29 DIAGNOSIS — Z23 Encounter for immunization: Secondary | ICD-10-CM | POA: Diagnosis not present

## 2017-03-29 DIAGNOSIS — N2581 Secondary hyperparathyroidism of renal origin: Secondary | ICD-10-CM | POA: Diagnosis not present

## 2017-03-29 DIAGNOSIS — N186 End stage renal disease: Secondary | ICD-10-CM | POA: Diagnosis not present

## 2017-03-30 DIAGNOSIS — N186 End stage renal disease: Secondary | ICD-10-CM | POA: Diagnosis not present

## 2017-03-30 DIAGNOSIS — Z992 Dependence on renal dialysis: Secondary | ICD-10-CM | POA: Diagnosis not present

## 2017-03-30 DIAGNOSIS — E1022 Type 1 diabetes mellitus with diabetic chronic kidney disease: Secondary | ICD-10-CM | POA: Diagnosis not present

## 2017-03-31 DIAGNOSIS — D509 Iron deficiency anemia, unspecified: Secondary | ICD-10-CM | POA: Diagnosis not present

## 2017-03-31 DIAGNOSIS — N2581 Secondary hyperparathyroidism of renal origin: Secondary | ICD-10-CM | POA: Diagnosis not present

## 2017-03-31 DIAGNOSIS — D631 Anemia in chronic kidney disease: Secondary | ICD-10-CM | POA: Diagnosis not present

## 2017-03-31 DIAGNOSIS — E1122 Type 2 diabetes mellitus with diabetic chronic kidney disease: Secondary | ICD-10-CM | POA: Diagnosis not present

## 2017-03-31 DIAGNOSIS — N186 End stage renal disease: Secondary | ICD-10-CM | POA: Diagnosis not present

## 2017-04-03 DIAGNOSIS — D631 Anemia in chronic kidney disease: Secondary | ICD-10-CM | POA: Diagnosis not present

## 2017-04-03 DIAGNOSIS — E1122 Type 2 diabetes mellitus with diabetic chronic kidney disease: Secondary | ICD-10-CM | POA: Diagnosis not present

## 2017-04-03 DIAGNOSIS — N186 End stage renal disease: Secondary | ICD-10-CM | POA: Diagnosis not present

## 2017-04-03 DIAGNOSIS — D509 Iron deficiency anemia, unspecified: Secondary | ICD-10-CM | POA: Diagnosis not present

## 2017-04-03 DIAGNOSIS — N2581 Secondary hyperparathyroidism of renal origin: Secondary | ICD-10-CM | POA: Diagnosis not present

## 2017-04-05 DIAGNOSIS — N2581 Secondary hyperparathyroidism of renal origin: Secondary | ICD-10-CM | POA: Diagnosis not present

## 2017-04-05 DIAGNOSIS — N186 End stage renal disease: Secondary | ICD-10-CM | POA: Diagnosis not present

## 2017-04-05 DIAGNOSIS — D631 Anemia in chronic kidney disease: Secondary | ICD-10-CM | POA: Diagnosis not present

## 2017-04-05 DIAGNOSIS — D509 Iron deficiency anemia, unspecified: Secondary | ICD-10-CM | POA: Diagnosis not present

## 2017-04-05 DIAGNOSIS — E1122 Type 2 diabetes mellitus with diabetic chronic kidney disease: Secondary | ICD-10-CM | POA: Diagnosis not present

## 2017-04-17 DIAGNOSIS — E1122 Type 2 diabetes mellitus with diabetic chronic kidney disease: Secondary | ICD-10-CM | POA: Diagnosis not present

## 2017-04-17 DIAGNOSIS — D631 Anemia in chronic kidney disease: Secondary | ICD-10-CM | POA: Diagnosis not present

## 2017-04-17 DIAGNOSIS — D509 Iron deficiency anemia, unspecified: Secondary | ICD-10-CM | POA: Diagnosis not present

## 2017-04-17 DIAGNOSIS — N2581 Secondary hyperparathyroidism of renal origin: Secondary | ICD-10-CM | POA: Diagnosis not present

## 2017-04-17 DIAGNOSIS — N186 End stage renal disease: Secondary | ICD-10-CM | POA: Diagnosis not present

## 2017-04-19 DIAGNOSIS — E1122 Type 2 diabetes mellitus with diabetic chronic kidney disease: Secondary | ICD-10-CM | POA: Diagnosis not present

## 2017-04-19 DIAGNOSIS — D631 Anemia in chronic kidney disease: Secondary | ICD-10-CM | POA: Diagnosis not present

## 2017-04-19 DIAGNOSIS — N186 End stage renal disease: Secondary | ICD-10-CM | POA: Diagnosis not present

## 2017-04-19 DIAGNOSIS — N2581 Secondary hyperparathyroidism of renal origin: Secondary | ICD-10-CM | POA: Diagnosis not present

## 2017-04-19 DIAGNOSIS — D509 Iron deficiency anemia, unspecified: Secondary | ICD-10-CM | POA: Diagnosis not present

## 2017-04-20 DIAGNOSIS — N186 End stage renal disease: Secondary | ICD-10-CM | POA: Diagnosis not present

## 2017-04-20 DIAGNOSIS — T82858A Stenosis of vascular prosthetic devices, implants and grafts, initial encounter: Secondary | ICD-10-CM | POA: Diagnosis not present

## 2017-04-20 DIAGNOSIS — Z992 Dependence on renal dialysis: Secondary | ICD-10-CM | POA: Diagnosis not present

## 2017-04-20 DIAGNOSIS — I871 Compression of vein: Secondary | ICD-10-CM | POA: Diagnosis not present

## 2017-04-21 DIAGNOSIS — D631 Anemia in chronic kidney disease: Secondary | ICD-10-CM | POA: Diagnosis not present

## 2017-04-21 DIAGNOSIS — D509 Iron deficiency anemia, unspecified: Secondary | ICD-10-CM | POA: Diagnosis not present

## 2017-04-21 DIAGNOSIS — N2581 Secondary hyperparathyroidism of renal origin: Secondary | ICD-10-CM | POA: Diagnosis not present

## 2017-04-21 DIAGNOSIS — E1122 Type 2 diabetes mellitus with diabetic chronic kidney disease: Secondary | ICD-10-CM | POA: Diagnosis not present

## 2017-04-21 DIAGNOSIS — N186 End stage renal disease: Secondary | ICD-10-CM | POA: Diagnosis not present

## 2017-04-23 DIAGNOSIS — D631 Anemia in chronic kidney disease: Secondary | ICD-10-CM | POA: Diagnosis not present

## 2017-04-23 DIAGNOSIS — D509 Iron deficiency anemia, unspecified: Secondary | ICD-10-CM | POA: Diagnosis not present

## 2017-04-23 DIAGNOSIS — E1122 Type 2 diabetes mellitus with diabetic chronic kidney disease: Secondary | ICD-10-CM | POA: Diagnosis not present

## 2017-04-23 DIAGNOSIS — N2581 Secondary hyperparathyroidism of renal origin: Secondary | ICD-10-CM | POA: Diagnosis not present

## 2017-04-23 DIAGNOSIS — N186 End stage renal disease: Secondary | ICD-10-CM | POA: Diagnosis not present

## 2017-04-26 DIAGNOSIS — N186 End stage renal disease: Secondary | ICD-10-CM | POA: Diagnosis not present

## 2017-04-26 DIAGNOSIS — D631 Anemia in chronic kidney disease: Secondary | ICD-10-CM | POA: Diagnosis not present

## 2017-04-26 DIAGNOSIS — E1122 Type 2 diabetes mellitus with diabetic chronic kidney disease: Secondary | ICD-10-CM | POA: Diagnosis not present

## 2017-04-26 DIAGNOSIS — D509 Iron deficiency anemia, unspecified: Secondary | ICD-10-CM | POA: Diagnosis not present

## 2017-04-26 DIAGNOSIS — N2581 Secondary hyperparathyroidism of renal origin: Secondary | ICD-10-CM | POA: Diagnosis not present

## 2017-04-28 DIAGNOSIS — N186 End stage renal disease: Secondary | ICD-10-CM | POA: Diagnosis not present

## 2017-04-28 DIAGNOSIS — N2581 Secondary hyperparathyroidism of renal origin: Secondary | ICD-10-CM | POA: Diagnosis not present

## 2017-04-28 DIAGNOSIS — D631 Anemia in chronic kidney disease: Secondary | ICD-10-CM | POA: Diagnosis not present

## 2017-04-28 DIAGNOSIS — D509 Iron deficiency anemia, unspecified: Secondary | ICD-10-CM | POA: Diagnosis not present

## 2017-04-28 DIAGNOSIS — E1122 Type 2 diabetes mellitus with diabetic chronic kidney disease: Secondary | ICD-10-CM | POA: Diagnosis not present

## 2017-04-30 DIAGNOSIS — Z992 Dependence on renal dialysis: Secondary | ICD-10-CM | POA: Diagnosis not present

## 2017-04-30 DIAGNOSIS — N186 End stage renal disease: Secondary | ICD-10-CM | POA: Diagnosis not present

## 2017-04-30 DIAGNOSIS — E1022 Type 1 diabetes mellitus with diabetic chronic kidney disease: Secondary | ICD-10-CM | POA: Diagnosis not present

## 2017-05-03 DIAGNOSIS — N2581 Secondary hyperparathyroidism of renal origin: Secondary | ICD-10-CM | POA: Diagnosis not present

## 2017-05-03 DIAGNOSIS — D631 Anemia in chronic kidney disease: Secondary | ICD-10-CM | POA: Diagnosis not present

## 2017-05-03 DIAGNOSIS — D509 Iron deficiency anemia, unspecified: Secondary | ICD-10-CM | POA: Diagnosis not present

## 2017-05-03 DIAGNOSIS — N186 End stage renal disease: Secondary | ICD-10-CM | POA: Diagnosis not present

## 2017-05-03 DIAGNOSIS — E1122 Type 2 diabetes mellitus with diabetic chronic kidney disease: Secondary | ICD-10-CM | POA: Diagnosis not present

## 2017-05-05 DIAGNOSIS — N186 End stage renal disease: Secondary | ICD-10-CM | POA: Diagnosis not present

## 2017-05-05 DIAGNOSIS — D509 Iron deficiency anemia, unspecified: Secondary | ICD-10-CM | POA: Diagnosis not present

## 2017-05-05 DIAGNOSIS — N2581 Secondary hyperparathyroidism of renal origin: Secondary | ICD-10-CM | POA: Diagnosis not present

## 2017-05-05 DIAGNOSIS — D631 Anemia in chronic kidney disease: Secondary | ICD-10-CM | POA: Diagnosis not present

## 2017-05-05 DIAGNOSIS — E1122 Type 2 diabetes mellitus with diabetic chronic kidney disease: Secondary | ICD-10-CM | POA: Diagnosis not present

## 2017-05-08 DIAGNOSIS — D509 Iron deficiency anemia, unspecified: Secondary | ICD-10-CM | POA: Diagnosis not present

## 2017-05-08 DIAGNOSIS — E1122 Type 2 diabetes mellitus with diabetic chronic kidney disease: Secondary | ICD-10-CM | POA: Diagnosis not present

## 2017-05-08 DIAGNOSIS — N186 End stage renal disease: Secondary | ICD-10-CM | POA: Diagnosis not present

## 2017-05-08 DIAGNOSIS — N2581 Secondary hyperparathyroidism of renal origin: Secondary | ICD-10-CM | POA: Diagnosis not present

## 2017-05-08 DIAGNOSIS — D631 Anemia in chronic kidney disease: Secondary | ICD-10-CM | POA: Diagnosis not present

## 2017-05-10 DIAGNOSIS — D509 Iron deficiency anemia, unspecified: Secondary | ICD-10-CM | POA: Diagnosis not present

## 2017-05-10 DIAGNOSIS — E1122 Type 2 diabetes mellitus with diabetic chronic kidney disease: Secondary | ICD-10-CM | POA: Diagnosis not present

## 2017-05-10 DIAGNOSIS — N2581 Secondary hyperparathyroidism of renal origin: Secondary | ICD-10-CM | POA: Diagnosis not present

## 2017-05-10 DIAGNOSIS — D631 Anemia in chronic kidney disease: Secondary | ICD-10-CM | POA: Diagnosis not present

## 2017-05-10 DIAGNOSIS — N186 End stage renal disease: Secondary | ICD-10-CM | POA: Diagnosis not present

## 2017-05-12 DIAGNOSIS — D631 Anemia in chronic kidney disease: Secondary | ICD-10-CM | POA: Diagnosis not present

## 2017-05-12 DIAGNOSIS — N186 End stage renal disease: Secondary | ICD-10-CM | POA: Diagnosis not present

## 2017-05-12 DIAGNOSIS — E1122 Type 2 diabetes mellitus with diabetic chronic kidney disease: Secondary | ICD-10-CM | POA: Diagnosis not present

## 2017-05-12 DIAGNOSIS — D509 Iron deficiency anemia, unspecified: Secondary | ICD-10-CM | POA: Diagnosis not present

## 2017-05-12 DIAGNOSIS — N2581 Secondary hyperparathyroidism of renal origin: Secondary | ICD-10-CM | POA: Diagnosis not present

## 2017-05-15 DIAGNOSIS — N2581 Secondary hyperparathyroidism of renal origin: Secondary | ICD-10-CM | POA: Diagnosis not present

## 2017-05-15 DIAGNOSIS — N186 End stage renal disease: Secondary | ICD-10-CM | POA: Diagnosis not present

## 2017-05-15 DIAGNOSIS — D509 Iron deficiency anemia, unspecified: Secondary | ICD-10-CM | POA: Diagnosis not present

## 2017-05-15 DIAGNOSIS — D631 Anemia in chronic kidney disease: Secondary | ICD-10-CM | POA: Diagnosis not present

## 2017-05-15 DIAGNOSIS — E1122 Type 2 diabetes mellitus with diabetic chronic kidney disease: Secondary | ICD-10-CM | POA: Diagnosis not present

## 2017-05-17 DIAGNOSIS — D631 Anemia in chronic kidney disease: Secondary | ICD-10-CM | POA: Diagnosis not present

## 2017-05-17 DIAGNOSIS — E1122 Type 2 diabetes mellitus with diabetic chronic kidney disease: Secondary | ICD-10-CM | POA: Diagnosis not present

## 2017-05-17 DIAGNOSIS — N186 End stage renal disease: Secondary | ICD-10-CM | POA: Diagnosis not present

## 2017-05-17 DIAGNOSIS — N2581 Secondary hyperparathyroidism of renal origin: Secondary | ICD-10-CM | POA: Diagnosis not present

## 2017-05-17 DIAGNOSIS — D509 Iron deficiency anemia, unspecified: Secondary | ICD-10-CM | POA: Diagnosis not present

## 2017-05-19 DIAGNOSIS — D631 Anemia in chronic kidney disease: Secondary | ICD-10-CM | POA: Diagnosis not present

## 2017-05-19 DIAGNOSIS — N186 End stage renal disease: Secondary | ICD-10-CM | POA: Diagnosis not present

## 2017-05-19 DIAGNOSIS — D509 Iron deficiency anemia, unspecified: Secondary | ICD-10-CM | POA: Diagnosis not present

## 2017-05-19 DIAGNOSIS — N2581 Secondary hyperparathyroidism of renal origin: Secondary | ICD-10-CM | POA: Diagnosis not present

## 2017-05-19 DIAGNOSIS — E1122 Type 2 diabetes mellitus with diabetic chronic kidney disease: Secondary | ICD-10-CM | POA: Diagnosis not present

## 2017-05-22 DIAGNOSIS — N2581 Secondary hyperparathyroidism of renal origin: Secondary | ICD-10-CM | POA: Diagnosis not present

## 2017-05-22 DIAGNOSIS — D631 Anemia in chronic kidney disease: Secondary | ICD-10-CM | POA: Diagnosis not present

## 2017-05-22 DIAGNOSIS — N186 End stage renal disease: Secondary | ICD-10-CM | POA: Diagnosis not present

## 2017-05-22 DIAGNOSIS — D509 Iron deficiency anemia, unspecified: Secondary | ICD-10-CM | POA: Diagnosis not present

## 2017-05-22 DIAGNOSIS — E1122 Type 2 diabetes mellitus with diabetic chronic kidney disease: Secondary | ICD-10-CM | POA: Diagnosis not present

## 2017-05-24 DIAGNOSIS — N186 End stage renal disease: Secondary | ICD-10-CM | POA: Diagnosis not present

## 2017-05-24 DIAGNOSIS — D631 Anemia in chronic kidney disease: Secondary | ICD-10-CM | POA: Diagnosis not present

## 2017-05-24 DIAGNOSIS — D509 Iron deficiency anemia, unspecified: Secondary | ICD-10-CM | POA: Diagnosis not present

## 2017-05-24 DIAGNOSIS — N2581 Secondary hyperparathyroidism of renal origin: Secondary | ICD-10-CM | POA: Diagnosis not present

## 2017-05-24 DIAGNOSIS — E1122 Type 2 diabetes mellitus with diabetic chronic kidney disease: Secondary | ICD-10-CM | POA: Diagnosis not present

## 2017-05-24 DIAGNOSIS — E11319 Type 2 diabetes mellitus with unspecified diabetic retinopathy without macular edema: Secondary | ICD-10-CM | POA: Diagnosis not present

## 2017-05-26 DIAGNOSIS — N2581 Secondary hyperparathyroidism of renal origin: Secondary | ICD-10-CM | POA: Diagnosis not present

## 2017-05-26 DIAGNOSIS — D509 Iron deficiency anemia, unspecified: Secondary | ICD-10-CM | POA: Diagnosis not present

## 2017-05-26 DIAGNOSIS — N186 End stage renal disease: Secondary | ICD-10-CM | POA: Diagnosis not present

## 2017-05-26 DIAGNOSIS — D631 Anemia in chronic kidney disease: Secondary | ICD-10-CM | POA: Diagnosis not present

## 2017-05-26 DIAGNOSIS — E1122 Type 2 diabetes mellitus with diabetic chronic kidney disease: Secondary | ICD-10-CM | POA: Diagnosis not present

## 2017-05-29 DIAGNOSIS — D509 Iron deficiency anemia, unspecified: Secondary | ICD-10-CM | POA: Diagnosis not present

## 2017-05-29 DIAGNOSIS — E1122 Type 2 diabetes mellitus with diabetic chronic kidney disease: Secondary | ICD-10-CM | POA: Diagnosis not present

## 2017-05-29 DIAGNOSIS — N186 End stage renal disease: Secondary | ICD-10-CM | POA: Diagnosis not present

## 2017-05-29 DIAGNOSIS — D631 Anemia in chronic kidney disease: Secondary | ICD-10-CM | POA: Diagnosis not present

## 2017-05-29 DIAGNOSIS — N2581 Secondary hyperparathyroidism of renal origin: Secondary | ICD-10-CM | POA: Diagnosis not present

## 2017-05-31 DIAGNOSIS — Z992 Dependence on renal dialysis: Secondary | ICD-10-CM | POA: Diagnosis not present

## 2017-05-31 DIAGNOSIS — N2581 Secondary hyperparathyroidism of renal origin: Secondary | ICD-10-CM | POA: Diagnosis not present

## 2017-05-31 DIAGNOSIS — D509 Iron deficiency anemia, unspecified: Secondary | ICD-10-CM | POA: Diagnosis not present

## 2017-05-31 DIAGNOSIS — D631 Anemia in chronic kidney disease: Secondary | ICD-10-CM | POA: Diagnosis not present

## 2017-05-31 DIAGNOSIS — E1022 Type 1 diabetes mellitus with diabetic chronic kidney disease: Secondary | ICD-10-CM | POA: Diagnosis not present

## 2017-05-31 DIAGNOSIS — E1122 Type 2 diabetes mellitus with diabetic chronic kidney disease: Secondary | ICD-10-CM | POA: Diagnosis not present

## 2017-05-31 DIAGNOSIS — N186 End stage renal disease: Secondary | ICD-10-CM | POA: Diagnosis not present

## 2017-06-01 DIAGNOSIS — E1022 Type 1 diabetes mellitus with diabetic chronic kidney disease: Secondary | ICD-10-CM | POA: Diagnosis not present

## 2017-06-01 DIAGNOSIS — Z992 Dependence on renal dialysis: Secondary | ICD-10-CM | POA: Diagnosis not present

## 2017-06-01 DIAGNOSIS — N186 End stage renal disease: Secondary | ICD-10-CM | POA: Diagnosis not present

## 2017-06-02 DIAGNOSIS — N2581 Secondary hyperparathyroidism of renal origin: Secondary | ICD-10-CM | POA: Diagnosis not present

## 2017-06-02 DIAGNOSIS — N186 End stage renal disease: Secondary | ICD-10-CM | POA: Diagnosis not present

## 2017-06-02 DIAGNOSIS — E1122 Type 2 diabetes mellitus with diabetic chronic kidney disease: Secondary | ICD-10-CM | POA: Diagnosis not present

## 2017-06-02 DIAGNOSIS — D631 Anemia in chronic kidney disease: Secondary | ICD-10-CM | POA: Diagnosis not present

## 2017-06-05 DIAGNOSIS — D631 Anemia in chronic kidney disease: Secondary | ICD-10-CM | POA: Diagnosis not present

## 2017-06-05 DIAGNOSIS — N2581 Secondary hyperparathyroidism of renal origin: Secondary | ICD-10-CM | POA: Diagnosis not present

## 2017-06-05 DIAGNOSIS — E1122 Type 2 diabetes mellitus with diabetic chronic kidney disease: Secondary | ICD-10-CM | POA: Diagnosis not present

## 2017-06-05 DIAGNOSIS — N186 End stage renal disease: Secondary | ICD-10-CM | POA: Diagnosis not present

## 2017-06-07 DIAGNOSIS — N186 End stage renal disease: Secondary | ICD-10-CM | POA: Diagnosis not present

## 2017-06-07 DIAGNOSIS — E1122 Type 2 diabetes mellitus with diabetic chronic kidney disease: Secondary | ICD-10-CM | POA: Diagnosis not present

## 2017-06-07 DIAGNOSIS — N2581 Secondary hyperparathyroidism of renal origin: Secondary | ICD-10-CM | POA: Diagnosis not present

## 2017-06-07 DIAGNOSIS — D631 Anemia in chronic kidney disease: Secondary | ICD-10-CM | POA: Diagnosis not present

## 2017-06-09 DIAGNOSIS — E1122 Type 2 diabetes mellitus with diabetic chronic kidney disease: Secondary | ICD-10-CM | POA: Diagnosis not present

## 2017-06-09 DIAGNOSIS — N2581 Secondary hyperparathyroidism of renal origin: Secondary | ICD-10-CM | POA: Diagnosis not present

## 2017-06-09 DIAGNOSIS — N186 End stage renal disease: Secondary | ICD-10-CM | POA: Diagnosis not present

## 2017-06-09 DIAGNOSIS — D631 Anemia in chronic kidney disease: Secondary | ICD-10-CM | POA: Diagnosis not present

## 2017-06-12 DIAGNOSIS — D631 Anemia in chronic kidney disease: Secondary | ICD-10-CM | POA: Diagnosis not present

## 2017-06-12 DIAGNOSIS — E1122 Type 2 diabetes mellitus with diabetic chronic kidney disease: Secondary | ICD-10-CM | POA: Diagnosis not present

## 2017-06-12 DIAGNOSIS — N2581 Secondary hyperparathyroidism of renal origin: Secondary | ICD-10-CM | POA: Diagnosis not present

## 2017-06-12 DIAGNOSIS — N186 End stage renal disease: Secondary | ICD-10-CM | POA: Diagnosis not present

## 2017-06-14 DIAGNOSIS — E1122 Type 2 diabetes mellitus with diabetic chronic kidney disease: Secondary | ICD-10-CM | POA: Diagnosis not present

## 2017-06-14 DIAGNOSIS — N2581 Secondary hyperparathyroidism of renal origin: Secondary | ICD-10-CM | POA: Diagnosis not present

## 2017-06-14 DIAGNOSIS — N186 End stage renal disease: Secondary | ICD-10-CM | POA: Diagnosis not present

## 2017-06-14 DIAGNOSIS — D631 Anemia in chronic kidney disease: Secondary | ICD-10-CM | POA: Diagnosis not present

## 2017-06-16 DIAGNOSIS — N186 End stage renal disease: Secondary | ICD-10-CM | POA: Diagnosis not present

## 2017-06-16 DIAGNOSIS — N2581 Secondary hyperparathyroidism of renal origin: Secondary | ICD-10-CM | POA: Diagnosis not present

## 2017-06-16 DIAGNOSIS — D631 Anemia in chronic kidney disease: Secondary | ICD-10-CM | POA: Diagnosis not present

## 2017-06-16 DIAGNOSIS — E1122 Type 2 diabetes mellitus with diabetic chronic kidney disease: Secondary | ICD-10-CM | POA: Diagnosis not present

## 2017-06-19 DIAGNOSIS — N2581 Secondary hyperparathyroidism of renal origin: Secondary | ICD-10-CM | POA: Diagnosis not present

## 2017-06-19 DIAGNOSIS — N186 End stage renal disease: Secondary | ICD-10-CM | POA: Diagnosis not present

## 2017-06-19 DIAGNOSIS — E1122 Type 2 diabetes mellitus with diabetic chronic kidney disease: Secondary | ICD-10-CM | POA: Diagnosis not present

## 2017-06-19 DIAGNOSIS — D631 Anemia in chronic kidney disease: Secondary | ICD-10-CM | POA: Diagnosis not present

## 2017-06-20 ENCOUNTER — Encounter: Payer: Self-pay | Admitting: Internal Medicine

## 2017-06-20 ENCOUNTER — Ambulatory Visit (INDEPENDENT_AMBULATORY_CARE_PROVIDER_SITE_OTHER): Payer: Medicare Other | Admitting: Internal Medicine

## 2017-06-20 VITALS — BP 128/62 | HR 76 | Temp 97.5°F | Resp 16 | Ht 66.0 in | Wt 153.0 lb

## 2017-06-20 DIAGNOSIS — Z79899 Other long term (current) drug therapy: Secondary | ICD-10-CM

## 2017-06-20 DIAGNOSIS — E038 Other specified hypothyroidism: Secondary | ICD-10-CM

## 2017-06-20 DIAGNOSIS — E063 Autoimmune thyroiditis: Secondary | ICD-10-CM

## 2017-06-20 DIAGNOSIS — E559 Vitamin D deficiency, unspecified: Secondary | ICD-10-CM | POA: Diagnosis not present

## 2017-06-20 DIAGNOSIS — N185 Chronic kidney disease, stage 5: Secondary | ICD-10-CM

## 2017-06-20 DIAGNOSIS — N186 End stage renal disease: Secondary | ICD-10-CM | POA: Diagnosis not present

## 2017-06-20 DIAGNOSIS — I10 Essential (primary) hypertension: Secondary | ICD-10-CM

## 2017-06-20 DIAGNOSIS — E1122 Type 2 diabetes mellitus with diabetic chronic kidney disease: Secondary | ICD-10-CM | POA: Diagnosis not present

## 2017-06-20 DIAGNOSIS — E782 Mixed hyperlipidemia: Secondary | ICD-10-CM | POA: Diagnosis not present

## 2017-06-20 DIAGNOSIS — Z992 Dependence on renal dialysis: Secondary | ICD-10-CM | POA: Diagnosis not present

## 2017-06-20 NOTE — Progress Notes (Signed)
This very nice 67 y.o. [name] presents for 3 month follow up with Hypertension, HLD, T2_NIDDM/ESRD, Hypothyroidism and Vitamin D Deficiency.      Patient is treated for HTN & BP has been controlled at home. Today's BP is at goal -  128/62. Patient has had no complaints of any cardiac type chest pain, palpitations, dyspnea / orthopnea / PND, dizziness, claudication, or dependent edema.     Hyperlipidemia is not controlled with diet & meds. Patient denies myalgias or other med SE's. Last Lipids were not at goal: Lab Results  Component Value Date   CHOL 202 (H) 02/21/2017   HDL 53 02/21/2017   LDLCALC 107 (H) 11/22/2016   TRIG 180 (H) 02/21/2017   CHOLHDL 3.8 02/21/2017      Also, the patient has history of T2_NIDDM(1988) with ESRD and she has been on Dialysis since Nov 2016. She's followed by Dr Joelyn Oms and is on the Transplant list at [facility]. Patient is essentially blind from diabetic retinopathy (total on the Left  & hand motion & some finger counting on the Right,. Allegedly , she can read large print with a magnifier. With her declining renal functions, she has tapered off of her Diabetic meds and and has had no symptoms of reactive hypoglycemia, diabetic polys, paresthesias or visual blurring.  Last A1c was not at goal: Lab Results  Component Value Date   HGBA1C 6.6 (H) 02/21/2017                              Patient  has been on Thyroid replacement age 76 in 28 for hx/o Hashimoto's Dz.         Further, the patient also has history of Vitamin D Deficiency ("25"/2016)  and supplements vitamin D without any suspected side-effects. Last vitamin D was still very low:  Lab Results  Component Value Date   VD25OH 27 (L) 11/22/2016   Current Outpatient Medications on File Prior to Visit  Medication Sig  . aspirin EC 81 MG tablet Take 81 mg by mouth daily.  . Cholecalciferol (VITAMIN D3) 50000 UNITS CAPS Take 50,000 Units by mouth. With dialysis  . Cyanocobalamin (VITAMIN B 12 PO) Take  1,000 mcg by mouth daily. With dialysis  . ezetimibe (ZETIA) 10 MG tablet Take 1 tablet (10 mg total) by mouth daily.  Marland Kitchen glucose blood (PRODIGY NO CODING BLOOD GLUC) test strip 1 each.  . levothyroxine (SYNTHROID, LEVOTHROID) 150 MCG tablet Take 1 tablet (150 mcg total) by mouth daily before breakfast.  . OVER THE COUNTER MEDICATION Iron 65 mg 1 daily with dialysis  . SENSIPAR 30 MG tablet Take 1 tablet by mouth daily.  . sucroferric oxyhydroxide (VELPHORO) 500 MG chewable tablet Chew 500 mg by mouth 3 (three) times daily with meals.   No current facility-administered medications on file prior to visit.    Allergies  Allergen Reactions  . Hydralazine Shortness Of Breath    Chest pain fatigue  . Azor [Amlodipine-Olmesartan] Other (See Comments)    Sharp chest pain  Sharp chest pain  Chest pain  . Hydrochlorothiazide W-Triamterene   . Labetalol     Per patient, caused foot pain  . Lisinopril Other (See Comments)    Severe radiating foot pain.  . Metformin Itching    Loss of bowel control, general sick feeling  . Metoclopramide Other (See Comments)  . Other Other (See Comments)    "contractions in throat"  Diazide pt states cause throat to swell.  . Minoxidil Rash    Choking, neck contractions fatigue  . Spironolactone Other (See Comments) and Rash    Severe radiating pain in feet Severe radiating pain in feet.  . Statins Rash    Fatigue Cause fatigue   PMHx:   Past Medical History:  Diagnosis Date  . Allergy   . Blood transfusion without reported diagnosis   . Cataract    bilateral  . Diabetes mellitus without complication ([place])   . Diabetic retinopathy ([place])   . ESRD (end stage renal disease) ([place])   . Hashimoto's disease   . Hyperlipidemia   . Hypertension   . Hypothyroid   . Loss of vision    both eyes, no vision left eye and little in left    Immunization History  Administered Date(s) Administered  . Influenza Whole 04/23/2007  . Influenza-Unspecified  01/05/2014, 02/28/2016  . Pneumococcal-Unspecified 01/05/2010   Past Surgical History:  Procedure Laterality Date  . ABDOMINAL HYSTERECTOMY    . BREAST BIOPSY Right 2015   benign  . COLONOSCOPY    . EYE SURGERY    . KNEE SURGERY     FHx:    Reviewed / unchanged  SHx:    Reviewed / unchanged   Systems Review:  Constitutional: Denies fever, chills, wt changes, headaches, insomnia, fatigue, night sweats, change in appetite. Eyes: Denies redness, blurred vision, diplopia, discharge, itchy, watery eyes.  ENT: Denies discharge, congestion, post nasal drip, epistaxis, sore throat, earache, hearing loss, dental pain, tinnitus, vertigo, sinus pain, snoring.  CV: Denies chest pain, palpitations, irregular heartbeat, syncope, dyspnea, diaphoresis, orthopnea, PND, claudication or edema. Respiratory: denies cough, dyspnea, DOE, pleurisy, hoarseness, laryngitis, wheezing.  Gastrointestinal: Denies dysphagia, odynophagia, heartburn, reflux, water brash, abdominal pain or cramps, nausea, vomiting, bloating, diarrhea, constipation, hematemesis, melena, hematochezia  or hemorrhoids. Genitourinary: Denies dysuria, frequency, urgency, nocturia, hesitancy, discharge, hematuria or flank pain. Musculoskeletal: Denies arthralgias, myalgias, stiffness, jt. swelling, pain, limping or strain/sprain.  Skin: Denies pruritus, rash, hives, warts, acne, eczema or change in skin lesion(s). Neuro: No weakness, tremor, incoordination, spasms, paresthesia or pain. Psychiatric: Denies confusion, memory loss or sensory loss. Endo: Denies change in weight, skin or hair change.  Heme/Lymph: No excessive bleeding, bruising or enlarged lymph nodes.  Physical Exam  BP 128/62   Pulse 76   Temp (!) 97.5 F (36.4 C)   Resp 16   Ht 5\' 6"  (1.676 m)   Wt 153 lb (69.4 kg)   LMP  (LMP Unknown)   BMI 24.69 kg/m   Appears well nourished, well groomed  and in no distress.  Eyes: PERRLA, EOMs, conjunctiva no swelling or  erythema. Sinuses: No frontal/maxillary tenderness ENT/Mouth: EAC's clear, TM's nl w/o erythema, bulging. Nares clear w/o erythema, swelling, exudates. Oropharynx clear without erythema or exudates. Oral hygiene is good. Tongue normal, non obstructing. Hearing intact.  Neck: Supple. Thyroid not palpable. Car 2+/2+ without bruits, nodes or JVD. Chest: Respirations nl with BS clear & equal w/o rales, rhonchi, wheezing or stridor.  Cor: Heart sounds normal w/ regular rate and rhythm without sig. murmurs, gallops, clicks or rubs. Peripheral pulses normal and equal  without edema.  Abdomen: Soft & bowel sounds normal. Non-tender w/o guarding, rebound, hernias, masses or organomegaly.  Lymphatics: Unremarkable.  Musculoskeletal: Full ROM all peripheral extremities, joint stability, 5/5 strength and normal gait.  Skin: Warm, dry without exposed rashes, lesions or ecchymosis apparent.  Neuro: Cranial nerves intact, reflexes equal bilaterally. Sensory-motor testing grossly  intact. Tendon reflexes grossly intact.  Pysch: Alert & oriented x 3.  Insight and judgement nl & appropriate. No ideations.  Assessment and Plan:  1. Essential hypertension  - Continue medication, monitor blood pressure at home.  - Continue DASH diet. Reminder to go to the ER if any CP,  SOB, nausea, dizziness, severe HA, changes vision/speech.  - CBC with Differential/Platelet - BASIC METABOLIC PANEL WITH GFR - Magnesium - TSH  2. Hyperlipidemia, mixed  - Continue diet/meds, exercise,& lifestyle modifications.  - Continue monitor periodic cholesterol/liver & renal functions   - Hepatic function panel - Lipid panel - TSH  3. Type 2 diabetes mellitus with stage 5 chronic kidney disease not on chronic dialysis, without long-term current use of insulin (HCC)  - Hemoglobin A1c - Insulin, random  4. Vitamin D deficiency  - Continue diet, exercise, lifestyle modifications.  - Monitor appropriate labs. - Continue  supplementation.  - VITAMIN D 25 Hydroxy   5. End stage renal disease on dialysis due to type 2 diabetes mellitus (HCC)  - BASIC METABOLIC PANEL WITH GFR  6. Hypothyroidism due to Hashimoto's thyroiditis  - TSH  7. Medication management  - CBC with Differential/Platelet - BASIC METABOLIC PANEL WITH GFR - Hepatic function panel - Magnesium - Lipid panel - TSH - Hemoglobin A1c - Insulin, random - VITAMIN D 25 Hydroxy          Discussed  regular exercise, BP monitoring, weight control  and discussed med and SE's. Recommended labs to assess and monitor clinical status with further disposition pending results of labs. Over 30 minutes of exam, counseling, chart review was performed.

## 2017-06-20 NOTE — Patient Instructions (Signed)

## 2017-06-21 DIAGNOSIS — N186 End stage renal disease: Secondary | ICD-10-CM | POA: Diagnosis not present

## 2017-06-21 DIAGNOSIS — E1122 Type 2 diabetes mellitus with diabetic chronic kidney disease: Secondary | ICD-10-CM | POA: Diagnosis not present

## 2017-06-21 DIAGNOSIS — D631 Anemia in chronic kidney disease: Secondary | ICD-10-CM | POA: Diagnosis not present

## 2017-06-21 DIAGNOSIS — N2581 Secondary hyperparathyroidism of renal origin: Secondary | ICD-10-CM | POA: Diagnosis not present

## 2017-06-21 LAB — HEMOGLOBIN A1C
Hgb A1c MFr Bld: 6.7 % of total Hgb — ABNORMAL HIGH (ref <5.7)
Mean Plasma Glucose: 146 (calc)
eAG (mmol/L): 8.1 (calc)

## 2017-06-21 LAB — CBC WITH DIFFERENTIAL/PLATELET
BASOS ABS: 49 {cells}/uL (ref 0–200)
Basophils Relative: 0.8 %
EOS ABS: 110 {cells}/uL (ref 15–500)
Eosinophils Relative: 1.8 %
HCT: 33.1 % — ABNORMAL LOW (ref 35.0–45.0)
HEMOGLOBIN: 11.2 g/dL — AB (ref 11.7–15.5)
Lymphs Abs: 2281 cells/uL (ref 850–3900)
MCH: 31.5 pg (ref 27.0–33.0)
MCHC: 33.8 g/dL (ref 32.0–36.0)
MCV: 93.2 fL (ref 80.0–100.0)
MONOS PCT: 9.2 %
MPV: 12 fL (ref 7.5–12.5)
Neutro Abs: 3099 cells/uL (ref 1500–7800)
Neutrophils Relative %: 50.8 %
PLATELETS: 210 10*3/uL (ref 140–400)
RBC: 3.55 10*6/uL — ABNORMAL LOW (ref 3.80–5.10)
RDW: 14.4 % (ref 11.0–15.0)
TOTAL LYMPHOCYTE: 37.4 %
WBC mixed population: 561 cells/uL (ref 200–950)
WBC: 6.1 10*3/uL (ref 3.8–10.8)

## 2017-06-21 LAB — HEPATIC FUNCTION PANEL
AG RATIO: 1.3 (calc) (ref 1.0–2.5)
ALBUMIN MSPROF: 4.1 g/dL (ref 3.6–5.1)
ALT: 7 U/L (ref 6–29)
AST: 10 U/L (ref 10–35)
Alkaline phosphatase (APISO): 51 U/L (ref 33–130)
BILIRUBIN TOTAL: 0.4 mg/dL (ref 0.2–1.2)
Bilirubin, Direct: 0.1 mg/dL (ref 0.0–0.2)
GLOBULIN: 3.2 g/dL (ref 1.9–3.7)
Indirect Bilirubin: 0.3 mg/dL (calc) (ref 0.2–1.2)
Total Protein: 7.3 g/dL (ref 6.1–8.1)

## 2017-06-21 LAB — BASIC METABOLIC PANEL WITH GFR
BUN/Creatinine Ratio: 4 (calc) — ABNORMAL LOW (ref 6–22)
BUN: 42 mg/dL — AB (ref 7–25)
CALCIUM: 10.1 mg/dL (ref 8.6–10.4)
CO2: 32 mmol/L (ref 20–32)
CREATININE: 10.63 mg/dL — AB (ref 0.50–0.99)
Chloride: 98 mmol/L (ref 98–110)
GFR, EST NON AFRICAN AMERICAN: 3 mL/min/{1.73_m2} — AB (ref >=60)
GFR, Est African American: 4 mL/min/{1.73_m2} — ABNORMAL LOW (ref >=60)
Glucose, Bld: 116 mg/dL — ABNORMAL HIGH (ref 65–99)
Potassium: 5.3 mmol/L (ref 3.5–5.3)
SODIUM: 146 mmol/L (ref 135–146)

## 2017-06-21 LAB — LIPID PANEL
Cholesterol: 238 mg/dL — ABNORMAL HIGH (ref <200)
HDL: 47 mg/dL — ABNORMAL LOW (ref >50)
LDL Cholesterol (Calc): 153 mg/dL (calc) — ABNORMAL HIGH
Non-HDL Cholesterol (Calc): 191 mg/dL (calc) — ABNORMAL HIGH (ref <130)
Total CHOL/HDL Ratio: 5.1 (calc) — ABNORMAL HIGH (ref <5.0)
Triglycerides: 212 mg/dL — ABNORMAL HIGH (ref <150)

## 2017-06-21 LAB — MAGNESIUM: Magnesium: 2.7 mg/dL — ABNORMAL HIGH (ref 1.5–2.5)

## 2017-06-21 LAB — VITAMIN D 25 HYDROXY (VIT D DEFICIENCY, FRACTURES): Vit D, 25-Hydroxy: 25 ng/mL — ABNORMAL LOW (ref 30–100)

## 2017-06-21 LAB — TSH: TSH: 2.05 mIU/L (ref 0.40–4.50)

## 2017-06-21 LAB — INSULIN, RANDOM: Insulin: 6.2 u[IU]/mL (ref 2.0–19.6)

## 2017-06-22 ENCOUNTER — Other Ambulatory Visit: Payer: Self-pay | Admitting: Internal Medicine

## 2017-06-22 ENCOUNTER — Encounter: Payer: Self-pay | Admitting: Podiatry

## 2017-06-22 ENCOUNTER — Ambulatory Visit (INDEPENDENT_AMBULATORY_CARE_PROVIDER_SITE_OTHER): Payer: Medicare Other | Admitting: Podiatry

## 2017-06-22 ENCOUNTER — Other Ambulatory Visit: Payer: Self-pay

## 2017-06-22 DIAGNOSIS — B351 Tinea unguium: Secondary | ICD-10-CM

## 2017-06-22 DIAGNOSIS — E1040 Type 1 diabetes mellitus with diabetic neuropathy, unspecified: Secondary | ICD-10-CM | POA: Diagnosis not present

## 2017-06-22 MED ORDER — GLIPIZIDE 5 MG PO TABS: ORAL_TABLET | ORAL | 11 refills | Status: DC

## 2017-06-22 NOTE — Progress Notes (Signed)
Patient ID: Denise Bush, female   DOB: [birth year], 67 y.o.   MRN: [id number] Complaint:  Visit Type: Patient returns to my office for continued preventative foot care services. Complaint: Patient states" my nails have grown long and thick and become painful to walk and wear shoes" Patient has been diagnosed with DM with no foot complications. The patient presents for preventative foot care services. No changes to ROS.  She is under dialysis. Podiatric Exam: Vascular: dorsalis pedis and posterior tibial pulses are palpable bilateral. Capillary return is immediate. Temperature gradient is WNL. Skin turgor WNL  Sensorium: Diminished  Semmes Weinstein monofilament test. Normal tactile sensation bilaterally. Nail Exam: Pt has thick disfigured discolored nails with subungual debris noted bilateral entire nail hallux through fifth toenails Ulcer Exam: There is no evidence of ulcer or pre-ulcerative changes or infection. Orthopedic Exam: Muscle tone and strength are WNL. No limitations in general ROM. No crepitus or effusions noted. Foot type and digits show no abnormalities. Bony prominences are unremarkable. Skin:  Asymptomatic  Porokeratosis sub 5th met left foot. No infection or ulcers  Diagnosis:  Onychomycosis, , Pain in right toe, pain in left toes, .  Treatment & Plan Procedures and Treatment: Consent by patient was obtained for treatment procedures. The patient understood the discussion of treatment and procedures well. All questions were answered thoroughly reviewed. Debridement of mycotic and hypertrophic toenails, 1 through 5 bilateral and clearing of subungual debris. No ulceration, no infection noted.   ABN signed for 2019.Marland Kitchen Return Visit-Office Procedure: Patient instructed to return to the office for a follow up visit 4 months for continued evaluation and treatment.Gardiner Barefoot DPM

## 2017-06-23 DIAGNOSIS — N186 End stage renal disease: Secondary | ICD-10-CM | POA: Diagnosis not present

## 2017-06-23 DIAGNOSIS — E1122 Type 2 diabetes mellitus with diabetic chronic kidney disease: Secondary | ICD-10-CM | POA: Diagnosis not present

## 2017-06-23 DIAGNOSIS — D631 Anemia in chronic kidney disease: Secondary | ICD-10-CM | POA: Diagnosis not present

## 2017-06-23 DIAGNOSIS — N2581 Secondary hyperparathyroidism of renal origin: Secondary | ICD-10-CM | POA: Diagnosis not present

## 2017-06-26 DIAGNOSIS — E1122 Type 2 diabetes mellitus with diabetic chronic kidney disease: Secondary | ICD-10-CM | POA: Diagnosis not present

## 2017-06-26 DIAGNOSIS — D631 Anemia in chronic kidney disease: Secondary | ICD-10-CM | POA: Diagnosis not present

## 2017-06-26 DIAGNOSIS — N186 End stage renal disease: Secondary | ICD-10-CM | POA: Diagnosis not present

## 2017-06-26 DIAGNOSIS — N2581 Secondary hyperparathyroidism of renal origin: Secondary | ICD-10-CM | POA: Diagnosis not present

## 2017-06-28 DIAGNOSIS — E1122 Type 2 diabetes mellitus with diabetic chronic kidney disease: Secondary | ICD-10-CM | POA: Diagnosis not present

## 2017-06-28 DIAGNOSIS — N186 End stage renal disease: Secondary | ICD-10-CM | POA: Diagnosis not present

## 2017-06-28 DIAGNOSIS — N2581 Secondary hyperparathyroidism of renal origin: Secondary | ICD-10-CM | POA: Diagnosis not present

## 2017-06-28 DIAGNOSIS — D631 Anemia in chronic kidney disease: Secondary | ICD-10-CM | POA: Diagnosis not present

## 2017-06-29 DIAGNOSIS — E1022 Type 1 diabetes mellitus with diabetic chronic kidney disease: Secondary | ICD-10-CM | POA: Diagnosis not present

## 2017-06-29 DIAGNOSIS — Z992 Dependence on renal dialysis: Secondary | ICD-10-CM | POA: Diagnosis not present

## 2017-06-29 DIAGNOSIS — N186 End stage renal disease: Secondary | ICD-10-CM | POA: Diagnosis not present

## 2017-06-30 DIAGNOSIS — D631 Anemia in chronic kidney disease: Secondary | ICD-10-CM | POA: Diagnosis not present

## 2017-06-30 DIAGNOSIS — E1122 Type 2 diabetes mellitus with diabetic chronic kidney disease: Secondary | ICD-10-CM | POA: Diagnosis not present

## 2017-06-30 DIAGNOSIS — N186 End stage renal disease: Secondary | ICD-10-CM | POA: Diagnosis not present

## 2017-06-30 DIAGNOSIS — N2581 Secondary hyperparathyroidism of renal origin: Secondary | ICD-10-CM | POA: Diagnosis not present

## 2017-07-03 DIAGNOSIS — N2581 Secondary hyperparathyroidism of renal origin: Secondary | ICD-10-CM | POA: Diagnosis not present

## 2017-07-03 DIAGNOSIS — D631 Anemia in chronic kidney disease: Secondary | ICD-10-CM | POA: Diagnosis not present

## 2017-07-03 DIAGNOSIS — E1122 Type 2 diabetes mellitus with diabetic chronic kidney disease: Secondary | ICD-10-CM | POA: Diagnosis not present

## 2017-07-03 DIAGNOSIS — N186 End stage renal disease: Secondary | ICD-10-CM | POA: Diagnosis not present

## 2017-07-05 DIAGNOSIS — N2581 Secondary hyperparathyroidism of renal origin: Secondary | ICD-10-CM | POA: Diagnosis not present

## 2017-07-05 DIAGNOSIS — E1122 Type 2 diabetes mellitus with diabetic chronic kidney disease: Secondary | ICD-10-CM | POA: Diagnosis not present

## 2017-07-05 DIAGNOSIS — D631 Anemia in chronic kidney disease: Secondary | ICD-10-CM | POA: Diagnosis not present

## 2017-07-05 DIAGNOSIS — N186 End stage renal disease: Secondary | ICD-10-CM | POA: Diagnosis not present

## 2017-07-06 DIAGNOSIS — E113513 Type 2 diabetes mellitus with proliferative diabetic retinopathy with macular edema, bilateral: Secondary | ICD-10-CM | POA: Diagnosis not present

## 2017-07-06 DIAGNOSIS — H543 Unqualified visual loss, both eyes: Secondary | ICD-10-CM | POA: Diagnosis not present

## 2017-07-06 DIAGNOSIS — H26493 Other secondary cataract, bilateral: Secondary | ICD-10-CM | POA: Diagnosis not present

## 2017-07-06 DIAGNOSIS — Z961 Presence of intraocular lens: Secondary | ICD-10-CM | POA: Diagnosis not present

## 2017-07-06 DIAGNOSIS — H3343 Traction detachment of retina, bilateral: Secondary | ICD-10-CM | POA: Diagnosis not present

## 2017-07-07 DIAGNOSIS — E1122 Type 2 diabetes mellitus with diabetic chronic kidney disease: Secondary | ICD-10-CM | POA: Diagnosis not present

## 2017-07-07 DIAGNOSIS — N186 End stage renal disease: Secondary | ICD-10-CM | POA: Diagnosis not present

## 2017-07-07 DIAGNOSIS — D631 Anemia in chronic kidney disease: Secondary | ICD-10-CM | POA: Diagnosis not present

## 2017-07-07 DIAGNOSIS — N2581 Secondary hyperparathyroidism of renal origin: Secondary | ICD-10-CM | POA: Diagnosis not present

## 2017-07-10 DIAGNOSIS — D631 Anemia in chronic kidney disease: Secondary | ICD-10-CM | POA: Diagnosis not present

## 2017-07-10 DIAGNOSIS — E1122 Type 2 diabetes mellitus with diabetic chronic kidney disease: Secondary | ICD-10-CM | POA: Diagnosis not present

## 2017-07-10 DIAGNOSIS — N186 End stage renal disease: Secondary | ICD-10-CM | POA: Diagnosis not present

## 2017-07-10 DIAGNOSIS — N2581 Secondary hyperparathyroidism of renal origin: Secondary | ICD-10-CM | POA: Diagnosis not present

## 2017-07-12 DIAGNOSIS — N2581 Secondary hyperparathyroidism of renal origin: Secondary | ICD-10-CM | POA: Diagnosis not present

## 2017-07-12 DIAGNOSIS — D631 Anemia in chronic kidney disease: Secondary | ICD-10-CM | POA: Diagnosis not present

## 2017-07-12 DIAGNOSIS — E1122 Type 2 diabetes mellitus with diabetic chronic kidney disease: Secondary | ICD-10-CM | POA: Diagnosis not present

## 2017-07-12 DIAGNOSIS — N186 End stage renal disease: Secondary | ICD-10-CM | POA: Diagnosis not present

## 2017-07-17 DIAGNOSIS — D631 Anemia in chronic kidney disease: Secondary | ICD-10-CM | POA: Diagnosis not present

## 2017-07-17 DIAGNOSIS — N2581 Secondary hyperparathyroidism of renal origin: Secondary | ICD-10-CM | POA: Diagnosis not present

## 2017-07-17 DIAGNOSIS — E1122 Type 2 diabetes mellitus with diabetic chronic kidney disease: Secondary | ICD-10-CM | POA: Diagnosis not present

## 2017-07-17 DIAGNOSIS — N186 End stage renal disease: Secondary | ICD-10-CM | POA: Diagnosis not present

## 2017-07-19 DIAGNOSIS — N186 End stage renal disease: Secondary | ICD-10-CM | POA: Diagnosis not present

## 2017-07-19 DIAGNOSIS — E1122 Type 2 diabetes mellitus with diabetic chronic kidney disease: Secondary | ICD-10-CM | POA: Diagnosis not present

## 2017-07-19 DIAGNOSIS — N2581 Secondary hyperparathyroidism of renal origin: Secondary | ICD-10-CM | POA: Diagnosis not present

## 2017-07-19 DIAGNOSIS — D631 Anemia in chronic kidney disease: Secondary | ICD-10-CM | POA: Diagnosis not present

## 2017-07-21 DIAGNOSIS — E1122 Type 2 diabetes mellitus with diabetic chronic kidney disease: Secondary | ICD-10-CM | POA: Diagnosis not present

## 2017-07-21 DIAGNOSIS — N2581 Secondary hyperparathyroidism of renal origin: Secondary | ICD-10-CM | POA: Diagnosis not present

## 2017-07-21 DIAGNOSIS — D631 Anemia in chronic kidney disease: Secondary | ICD-10-CM | POA: Diagnosis not present

## 2017-07-21 DIAGNOSIS — N186 End stage renal disease: Secondary | ICD-10-CM | POA: Diagnosis not present

## 2017-07-24 DIAGNOSIS — E1122 Type 2 diabetes mellitus with diabetic chronic kidney disease: Secondary | ICD-10-CM | POA: Diagnosis not present

## 2017-07-24 DIAGNOSIS — N2581 Secondary hyperparathyroidism of renal origin: Secondary | ICD-10-CM | POA: Diagnosis not present

## 2017-07-24 DIAGNOSIS — D631 Anemia in chronic kidney disease: Secondary | ICD-10-CM | POA: Diagnosis not present

## 2017-07-24 DIAGNOSIS — N186 End stage renal disease: Secondary | ICD-10-CM | POA: Diagnosis not present

## 2017-07-26 DIAGNOSIS — D631 Anemia in chronic kidney disease: Secondary | ICD-10-CM | POA: Diagnosis not present

## 2017-07-26 DIAGNOSIS — E1122 Type 2 diabetes mellitus with diabetic chronic kidney disease: Secondary | ICD-10-CM | POA: Diagnosis not present

## 2017-07-26 DIAGNOSIS — N2581 Secondary hyperparathyroidism of renal origin: Secondary | ICD-10-CM | POA: Diagnosis not present

## 2017-07-26 DIAGNOSIS — N186 End stage renal disease: Secondary | ICD-10-CM | POA: Diagnosis not present

## 2017-07-28 DIAGNOSIS — N186 End stage renal disease: Secondary | ICD-10-CM | POA: Diagnosis not present

## 2017-07-28 DIAGNOSIS — N2581 Secondary hyperparathyroidism of renal origin: Secondary | ICD-10-CM | POA: Diagnosis not present

## 2017-07-28 DIAGNOSIS — D631 Anemia in chronic kidney disease: Secondary | ICD-10-CM | POA: Diagnosis not present

## 2017-07-28 DIAGNOSIS — E1122 Type 2 diabetes mellitus with diabetic chronic kidney disease: Secondary | ICD-10-CM | POA: Diagnosis not present

## 2017-07-30 DIAGNOSIS — E1022 Type 1 diabetes mellitus with diabetic chronic kidney disease: Secondary | ICD-10-CM | POA: Diagnosis not present

## 2017-07-30 DIAGNOSIS — D631 Anemia in chronic kidney disease: Secondary | ICD-10-CM | POA: Diagnosis not present

## 2017-07-30 DIAGNOSIS — N186 End stage renal disease: Secondary | ICD-10-CM | POA: Diagnosis not present

## 2017-07-30 DIAGNOSIS — Z992 Dependence on renal dialysis: Secondary | ICD-10-CM | POA: Diagnosis not present

## 2017-07-31 DIAGNOSIS — D631 Anemia in chronic kidney disease: Secondary | ICD-10-CM | POA: Diagnosis not present

## 2017-07-31 DIAGNOSIS — N2581 Secondary hyperparathyroidism of renal origin: Secondary | ICD-10-CM | POA: Diagnosis not present

## 2017-07-31 DIAGNOSIS — E1122 Type 2 diabetes mellitus with diabetic chronic kidney disease: Secondary | ICD-10-CM | POA: Diagnosis not present

## 2017-07-31 DIAGNOSIS — N186 End stage renal disease: Secondary | ICD-10-CM | POA: Diagnosis not present

## 2017-08-02 DIAGNOSIS — N186 End stage renal disease: Secondary | ICD-10-CM | POA: Diagnosis not present

## 2017-08-02 DIAGNOSIS — E1122 Type 2 diabetes mellitus with diabetic chronic kidney disease: Secondary | ICD-10-CM | POA: Diagnosis not present

## 2017-08-02 DIAGNOSIS — D631 Anemia in chronic kidney disease: Secondary | ICD-10-CM | POA: Diagnosis not present

## 2017-08-02 DIAGNOSIS — N2581 Secondary hyperparathyroidism of renal origin: Secondary | ICD-10-CM | POA: Diagnosis not present

## 2017-08-04 DIAGNOSIS — N186 End stage renal disease: Secondary | ICD-10-CM | POA: Diagnosis not present

## 2017-08-04 DIAGNOSIS — D631 Anemia in chronic kidney disease: Secondary | ICD-10-CM | POA: Diagnosis not present

## 2017-08-04 DIAGNOSIS — E1122 Type 2 diabetes mellitus with diabetic chronic kidney disease: Secondary | ICD-10-CM | POA: Diagnosis not present

## 2017-08-04 DIAGNOSIS — N2581 Secondary hyperparathyroidism of renal origin: Secondary | ICD-10-CM | POA: Diagnosis not present

## 2017-08-07 DIAGNOSIS — Z992 Dependence on renal dialysis: Secondary | ICD-10-CM | POA: Diagnosis not present

## 2017-08-07 DIAGNOSIS — N186 End stage renal disease: Secondary | ICD-10-CM | POA: Diagnosis not present

## 2017-08-09 DIAGNOSIS — N186 End stage renal disease: Secondary | ICD-10-CM | POA: Diagnosis not present

## 2017-08-09 DIAGNOSIS — Z992 Dependence on renal dialysis: Secondary | ICD-10-CM | POA: Diagnosis not present

## 2017-08-11 DIAGNOSIS — E1122 Type 2 diabetes mellitus with diabetic chronic kidney disease: Secondary | ICD-10-CM | POA: Diagnosis not present

## 2017-08-11 DIAGNOSIS — N2581 Secondary hyperparathyroidism of renal origin: Secondary | ICD-10-CM | POA: Diagnosis not present

## 2017-08-11 DIAGNOSIS — N186 End stage renal disease: Secondary | ICD-10-CM | POA: Diagnosis not present

## 2017-08-11 DIAGNOSIS — D631 Anemia in chronic kidney disease: Secondary | ICD-10-CM | POA: Diagnosis not present

## 2017-08-13 DIAGNOSIS — H3343 Traction detachment of retina, bilateral: Secondary | ICD-10-CM | POA: Diagnosis not present

## 2017-08-13 DIAGNOSIS — Z961 Presence of intraocular lens: Secondary | ICD-10-CM | POA: Diagnosis not present

## 2017-08-13 DIAGNOSIS — H26491 Other secondary cataract, right eye: Secondary | ICD-10-CM | POA: Diagnosis not present

## 2017-08-14 DIAGNOSIS — N186 End stage renal disease: Secondary | ICD-10-CM | POA: Diagnosis not present

## 2017-08-14 DIAGNOSIS — D631 Anemia in chronic kidney disease: Secondary | ICD-10-CM | POA: Diagnosis not present

## 2017-08-14 DIAGNOSIS — E1122 Type 2 diabetes mellitus with diabetic chronic kidney disease: Secondary | ICD-10-CM | POA: Diagnosis not present

## 2017-08-14 DIAGNOSIS — N2581 Secondary hyperparathyroidism of renal origin: Secondary | ICD-10-CM | POA: Diagnosis not present

## 2017-08-16 DIAGNOSIS — N2581 Secondary hyperparathyroidism of renal origin: Secondary | ICD-10-CM | POA: Diagnosis not present

## 2017-08-16 DIAGNOSIS — D631 Anemia in chronic kidney disease: Secondary | ICD-10-CM | POA: Diagnosis not present

## 2017-08-16 DIAGNOSIS — E1122 Type 2 diabetes mellitus with diabetic chronic kidney disease: Secondary | ICD-10-CM | POA: Diagnosis not present

## 2017-08-16 DIAGNOSIS — N186 End stage renal disease: Secondary | ICD-10-CM | POA: Diagnosis not present

## 2017-08-18 DIAGNOSIS — E1122 Type 2 diabetes mellitus with diabetic chronic kidney disease: Secondary | ICD-10-CM | POA: Diagnosis not present

## 2017-08-18 DIAGNOSIS — N186 End stage renal disease: Secondary | ICD-10-CM | POA: Diagnosis not present

## 2017-08-18 DIAGNOSIS — N2581 Secondary hyperparathyroidism of renal origin: Secondary | ICD-10-CM | POA: Diagnosis not present

## 2017-08-18 DIAGNOSIS — D631 Anemia in chronic kidney disease: Secondary | ICD-10-CM | POA: Diagnosis not present

## 2017-08-20 ENCOUNTER — Encounter: Payer: Self-pay | Admitting: Internal Medicine

## 2017-08-21 DIAGNOSIS — N2581 Secondary hyperparathyroidism of renal origin: Secondary | ICD-10-CM | POA: Diagnosis not present

## 2017-08-21 DIAGNOSIS — N186 End stage renal disease: Secondary | ICD-10-CM | POA: Diagnosis not present

## 2017-08-21 DIAGNOSIS — D631 Anemia in chronic kidney disease: Secondary | ICD-10-CM | POA: Diagnosis not present

## 2017-08-21 DIAGNOSIS — E1122 Type 2 diabetes mellitus with diabetic chronic kidney disease: Secondary | ICD-10-CM | POA: Diagnosis not present

## 2017-08-23 DIAGNOSIS — N2581 Secondary hyperparathyroidism of renal origin: Secondary | ICD-10-CM | POA: Diagnosis not present

## 2017-08-23 DIAGNOSIS — Z7682 Awaiting organ transplant status: Secondary | ICD-10-CM | POA: Diagnosis not present

## 2017-08-23 DIAGNOSIS — E1122 Type 2 diabetes mellitus with diabetic chronic kidney disease: Secondary | ICD-10-CM | POA: Diagnosis not present

## 2017-08-23 DIAGNOSIS — N186 End stage renal disease: Secondary | ICD-10-CM | POA: Diagnosis not present

## 2017-08-23 DIAGNOSIS — E11319 Type 2 diabetes mellitus with unspecified diabetic retinopathy without macular edema: Secondary | ICD-10-CM | POA: Diagnosis not present

## 2017-08-23 DIAGNOSIS — D631 Anemia in chronic kidney disease: Secondary | ICD-10-CM | POA: Diagnosis not present

## 2017-08-25 DIAGNOSIS — N2581 Secondary hyperparathyroidism of renal origin: Secondary | ICD-10-CM | POA: Diagnosis not present

## 2017-08-25 DIAGNOSIS — D631 Anemia in chronic kidney disease: Secondary | ICD-10-CM | POA: Diagnosis not present

## 2017-08-25 DIAGNOSIS — E1122 Type 2 diabetes mellitus with diabetic chronic kidney disease: Secondary | ICD-10-CM | POA: Diagnosis not present

## 2017-08-25 DIAGNOSIS — N186 End stage renal disease: Secondary | ICD-10-CM | POA: Diagnosis not present

## 2017-08-28 DIAGNOSIS — D631 Anemia in chronic kidney disease: Secondary | ICD-10-CM | POA: Diagnosis not present

## 2017-08-28 DIAGNOSIS — E1122 Type 2 diabetes mellitus with diabetic chronic kidney disease: Secondary | ICD-10-CM | POA: Diagnosis not present

## 2017-08-28 DIAGNOSIS — N2581 Secondary hyperparathyroidism of renal origin: Secondary | ICD-10-CM | POA: Diagnosis not present

## 2017-08-28 DIAGNOSIS — N186 End stage renal disease: Secondary | ICD-10-CM | POA: Diagnosis not present

## 2017-08-28 DIAGNOSIS — Z114 Encounter for screening for human immunodeficiency virus [HIV]: Secondary | ICD-10-CM | POA: Diagnosis not present

## 2017-08-28 DIAGNOSIS — Z139 Encounter for screening, unspecified: Secondary | ICD-10-CM | POA: Diagnosis not present

## 2017-08-29 DIAGNOSIS — Z992 Dependence on renal dialysis: Secondary | ICD-10-CM | POA: Diagnosis not present

## 2017-08-29 DIAGNOSIS — N186 End stage renal disease: Secondary | ICD-10-CM | POA: Diagnosis not present

## 2017-08-29 DIAGNOSIS — E1022 Type 1 diabetes mellitus with diabetic chronic kidney disease: Secondary | ICD-10-CM | POA: Diagnosis not present

## 2017-08-30 DIAGNOSIS — N186 End stage renal disease: Secondary | ICD-10-CM | POA: Diagnosis not present

## 2017-08-30 DIAGNOSIS — D509 Iron deficiency anemia, unspecified: Secondary | ICD-10-CM | POA: Diagnosis not present

## 2017-08-30 DIAGNOSIS — N2581 Secondary hyperparathyroidism of renal origin: Secondary | ICD-10-CM | POA: Diagnosis not present

## 2017-08-30 DIAGNOSIS — E1122 Type 2 diabetes mellitus with diabetic chronic kidney disease: Secondary | ICD-10-CM | POA: Diagnosis not present

## 2017-08-31 DIAGNOSIS — T82858A Stenosis of vascular prosthetic devices, implants and grafts, initial encounter: Secondary | ICD-10-CM | POA: Diagnosis not present

## 2017-08-31 DIAGNOSIS — N186 End stage renal disease: Secondary | ICD-10-CM | POA: Diagnosis not present

## 2017-08-31 DIAGNOSIS — I871 Compression of vein: Secondary | ICD-10-CM | POA: Diagnosis not present

## 2017-08-31 DIAGNOSIS — Z992 Dependence on renal dialysis: Secondary | ICD-10-CM | POA: Diagnosis not present

## 2017-09-01 DIAGNOSIS — N2581 Secondary hyperparathyroidism of renal origin: Secondary | ICD-10-CM | POA: Diagnosis not present

## 2017-09-01 DIAGNOSIS — E1122 Type 2 diabetes mellitus with diabetic chronic kidney disease: Secondary | ICD-10-CM | POA: Diagnosis not present

## 2017-09-01 DIAGNOSIS — N186 End stage renal disease: Secondary | ICD-10-CM | POA: Diagnosis not present

## 2017-09-01 DIAGNOSIS — D509 Iron deficiency anemia, unspecified: Secondary | ICD-10-CM | POA: Diagnosis not present

## 2017-09-04 DIAGNOSIS — N2581 Secondary hyperparathyroidism of renal origin: Secondary | ICD-10-CM | POA: Diagnosis not present

## 2017-09-04 DIAGNOSIS — D509 Iron deficiency anemia, unspecified: Secondary | ICD-10-CM | POA: Diagnosis not present

## 2017-09-04 DIAGNOSIS — N186 End stage renal disease: Secondary | ICD-10-CM | POA: Diagnosis not present

## 2017-09-04 DIAGNOSIS — E1122 Type 2 diabetes mellitus with diabetic chronic kidney disease: Secondary | ICD-10-CM | POA: Diagnosis not present

## 2017-09-06 DIAGNOSIS — N186 End stage renal disease: Secondary | ICD-10-CM | POA: Diagnosis not present

## 2017-09-06 DIAGNOSIS — E1122 Type 2 diabetes mellitus with diabetic chronic kidney disease: Secondary | ICD-10-CM | POA: Diagnosis not present

## 2017-09-06 DIAGNOSIS — D509 Iron deficiency anemia, unspecified: Secondary | ICD-10-CM | POA: Diagnosis not present

## 2017-09-06 DIAGNOSIS — N2581 Secondary hyperparathyroidism of renal origin: Secondary | ICD-10-CM | POA: Diagnosis not present

## 2017-09-08 DIAGNOSIS — N2581 Secondary hyperparathyroidism of renal origin: Secondary | ICD-10-CM | POA: Diagnosis not present

## 2017-09-08 DIAGNOSIS — N186 End stage renal disease: Secondary | ICD-10-CM | POA: Diagnosis not present

## 2017-09-08 DIAGNOSIS — D509 Iron deficiency anemia, unspecified: Secondary | ICD-10-CM | POA: Diagnosis not present

## 2017-09-08 DIAGNOSIS — E1122 Type 2 diabetes mellitus with diabetic chronic kidney disease: Secondary | ICD-10-CM | POA: Diagnosis not present

## 2017-09-11 DIAGNOSIS — N186 End stage renal disease: Secondary | ICD-10-CM | POA: Diagnosis not present

## 2017-09-11 DIAGNOSIS — N2581 Secondary hyperparathyroidism of renal origin: Secondary | ICD-10-CM | POA: Diagnosis not present

## 2017-09-11 DIAGNOSIS — E1122 Type 2 diabetes mellitus with diabetic chronic kidney disease: Secondary | ICD-10-CM | POA: Diagnosis not present

## 2017-09-11 DIAGNOSIS — D509 Iron deficiency anemia, unspecified: Secondary | ICD-10-CM | POA: Diagnosis not present

## 2017-09-13 DIAGNOSIS — N186 End stage renal disease: Secondary | ICD-10-CM | POA: Diagnosis not present

## 2017-09-13 DIAGNOSIS — D509 Iron deficiency anemia, unspecified: Secondary | ICD-10-CM | POA: Diagnosis not present

## 2017-09-13 DIAGNOSIS — N2581 Secondary hyperparathyroidism of renal origin: Secondary | ICD-10-CM | POA: Diagnosis not present

## 2017-09-13 DIAGNOSIS — E1122 Type 2 diabetes mellitus with diabetic chronic kidney disease: Secondary | ICD-10-CM | POA: Diagnosis not present

## 2017-09-22 DIAGNOSIS — N186 End stage renal disease: Secondary | ICD-10-CM | POA: Diagnosis not present

## 2017-09-22 DIAGNOSIS — N2581 Secondary hyperparathyroidism of renal origin: Secondary | ICD-10-CM | POA: Diagnosis not present

## 2017-09-22 DIAGNOSIS — E1122 Type 2 diabetes mellitus with diabetic chronic kidney disease: Secondary | ICD-10-CM | POA: Diagnosis not present

## 2017-09-22 DIAGNOSIS — D509 Iron deficiency anemia, unspecified: Secondary | ICD-10-CM | POA: Diagnosis not present

## 2017-09-25 DIAGNOSIS — D509 Iron deficiency anemia, unspecified: Secondary | ICD-10-CM | POA: Diagnosis not present

## 2017-09-25 DIAGNOSIS — N2581 Secondary hyperparathyroidism of renal origin: Secondary | ICD-10-CM | POA: Diagnosis not present

## 2017-09-25 DIAGNOSIS — E1122 Type 2 diabetes mellitus with diabetic chronic kidney disease: Secondary | ICD-10-CM | POA: Diagnosis not present

## 2017-09-25 DIAGNOSIS — N186 End stage renal disease: Secondary | ICD-10-CM | POA: Diagnosis not present

## 2017-09-27 DIAGNOSIS — N2581 Secondary hyperparathyroidism of renal origin: Secondary | ICD-10-CM | POA: Diagnosis not present

## 2017-09-27 DIAGNOSIS — N186 End stage renal disease: Secondary | ICD-10-CM | POA: Diagnosis not present

## 2017-09-27 DIAGNOSIS — D509 Iron deficiency anemia, unspecified: Secondary | ICD-10-CM | POA: Diagnosis not present

## 2017-09-27 DIAGNOSIS — E1122 Type 2 diabetes mellitus with diabetic chronic kidney disease: Secondary | ICD-10-CM | POA: Diagnosis not present

## 2017-09-28 DIAGNOSIS — E1122 Type 2 diabetes mellitus with diabetic chronic kidney disease: Secondary | ICD-10-CM | POA: Diagnosis not present

## 2017-09-28 DIAGNOSIS — Z794 Long term (current) use of insulin: Secondary | ICD-10-CM | POA: Diagnosis not present

## 2017-09-28 DIAGNOSIS — Z01818 Encounter for other preprocedural examination: Secondary | ICD-10-CM | POA: Diagnosis not present

## 2017-09-28 DIAGNOSIS — I120 Hypertensive chronic kidney disease with stage 5 chronic kidney disease or end stage renal disease: Secondary | ICD-10-CM | POA: Diagnosis not present

## 2017-09-28 DIAGNOSIS — N186 End stage renal disease: Secondary | ICD-10-CM | POA: Diagnosis not present

## 2017-09-29 DIAGNOSIS — D509 Iron deficiency anemia, unspecified: Secondary | ICD-10-CM | POA: Diagnosis not present

## 2017-09-29 DIAGNOSIS — Z992 Dependence on renal dialysis: Secondary | ICD-10-CM | POA: Diagnosis not present

## 2017-09-29 DIAGNOSIS — N2581 Secondary hyperparathyroidism of renal origin: Secondary | ICD-10-CM | POA: Diagnosis not present

## 2017-09-29 DIAGNOSIS — D631 Anemia in chronic kidney disease: Secondary | ICD-10-CM | POA: Diagnosis not present

## 2017-09-29 DIAGNOSIS — E1122 Type 2 diabetes mellitus with diabetic chronic kidney disease: Secondary | ICD-10-CM | POA: Diagnosis not present

## 2017-09-29 DIAGNOSIS — N186 End stage renal disease: Secondary | ICD-10-CM | POA: Diagnosis not present

## 2017-09-29 DIAGNOSIS — E1022 Type 1 diabetes mellitus with diabetic chronic kidney disease: Secondary | ICD-10-CM | POA: Diagnosis not present

## 2017-10-01 DIAGNOSIS — H53413 Scotoma involving central area, bilateral: Secondary | ICD-10-CM | POA: Diagnosis not present

## 2017-10-02 DIAGNOSIS — D509 Iron deficiency anemia, unspecified: Secondary | ICD-10-CM | POA: Diagnosis not present

## 2017-10-02 DIAGNOSIS — N186 End stage renal disease: Secondary | ICD-10-CM | POA: Diagnosis not present

## 2017-10-02 DIAGNOSIS — D631 Anemia in chronic kidney disease: Secondary | ICD-10-CM | POA: Diagnosis not present

## 2017-10-02 DIAGNOSIS — N2581 Secondary hyperparathyroidism of renal origin: Secondary | ICD-10-CM | POA: Diagnosis not present

## 2017-10-02 DIAGNOSIS — E1122 Type 2 diabetes mellitus with diabetic chronic kidney disease: Secondary | ICD-10-CM | POA: Diagnosis not present

## 2017-10-03 ENCOUNTER — Ambulatory Visit (INDEPENDENT_AMBULATORY_CARE_PROVIDER_SITE_OTHER): Payer: Medicare Other | Admitting: Internal Medicine

## 2017-10-03 VITALS — BP 124/76 | HR 80 | Temp 97.5°F | Resp 16 | Ht 66.0 in | Wt 149.0 lb

## 2017-10-03 DIAGNOSIS — Z992 Dependence on renal dialysis: Secondary | ICD-10-CM

## 2017-10-03 DIAGNOSIS — Z79899 Other long term (current) drug therapy: Secondary | ICD-10-CM

## 2017-10-03 DIAGNOSIS — Z1211 Encounter for screening for malignant neoplasm of colon: Secondary | ICD-10-CM

## 2017-10-03 DIAGNOSIS — E1122 Type 2 diabetes mellitus with diabetic chronic kidney disease: Secondary | ICD-10-CM

## 2017-10-03 DIAGNOSIS — E038 Other specified hypothyroidism: Secondary | ICD-10-CM

## 2017-10-03 DIAGNOSIS — E782 Mixed hyperlipidemia: Secondary | ICD-10-CM | POA: Diagnosis not present

## 2017-10-03 DIAGNOSIS — I10 Essential (primary) hypertension: Secondary | ICD-10-CM

## 2017-10-03 DIAGNOSIS — E113519 Type 2 diabetes mellitus with proliferative diabetic retinopathy with macular edema, unspecified eye: Secondary | ICD-10-CM

## 2017-10-03 DIAGNOSIS — Z136 Encounter for screening for cardiovascular disorders: Secondary | ICD-10-CM

## 2017-10-03 DIAGNOSIS — N186 End stage renal disease: Secondary | ICD-10-CM | POA: Diagnosis not present

## 2017-10-03 DIAGNOSIS — I700 Atherosclerosis of aorta: Secondary | ICD-10-CM | POA: Diagnosis not present

## 2017-10-03 DIAGNOSIS — N2581 Secondary hyperparathyroidism of renal origin: Secondary | ICD-10-CM | POA: Diagnosis not present

## 2017-10-03 DIAGNOSIS — E559 Vitamin D deficiency, unspecified: Secondary | ICD-10-CM

## 2017-10-03 DIAGNOSIS — E063 Autoimmune thyroiditis: Secondary | ICD-10-CM | POA: Diagnosis not present

## 2017-10-03 DIAGNOSIS — Z1212 Encounter for screening for malignant neoplasm of rectum: Secondary | ICD-10-CM

## 2017-10-03 NOTE — Patient Instructions (Signed)

## 2017-10-03 NOTE — Progress Notes (Signed)
[facility] Denise Bush, M.D.     Uvaldo Bristle. Silverio Lay, P.A.-C Liane [address], [place], [place] [address] Telephone [phone] Telefax [phone] Annual Screening/Preventative Visit & Comprehensive Evaluation &  Examination     This very nice 67 y.o. [name] presents for a Screening/Preventative Visit & comprehensive evaluation and management of multiple medical co-morbidities.  Patient has been followed for HTN, HLD, T2_NIDDM w/ESRD on Dialysis and Vitamin D Deficiency.      HTN predates circa 1998. Patient's BP has been controlled at home and patient denies any cardiac symptoms as chest pain, palpitations, shortness of breath, dizziness or ankle swelling. Today's BP is at goal - 124/76.      Patient's hyperlipidemia is not controlled with diet and she is intolerant to Statins. Patient denies myalgias or other medication SE's. Last lipids were not at goal: Lab Results  Component Value Date   CHOL 238 (H) 06/20/2017   HDL 47 (L) 06/20/2017   LDLCALC 153 (H) 06/20/2017   TRIG 212 (H) 06/20/2017   CHOLHDL 5.1 (H) 06/20/2017      Patient has T2_NIDDM predating since 1988 and  she has ESRD on Dialysis since Nov 2016. She's followed by Dr Joelyn Oms and is on the Transplant list. Patient is legally blind consequent of Diabetic Retinopathy (Total blind Lt Eye and to hand motion/finger counting on the Rt).  With her progressive decline in renal functions , she was able to taper off  her Diabetic meds and she denies reactive hypoglycemic symptoms, visual blurring, diabetic polys, or paresthesias. Last A1c was not at goal: Lab Results  Component Value Date   HGBA1C 6.7 (H) 06/20/2017      Patient has been on Thyroid replacement since age 62 (71) forhx/o Hashimoto's Dz.     Finally, patient has history of Vitamin D Deficiency ("25"/2016) and last Vitamin D was still low: Lab Results   Component Value Date   VD25OH 25 (L) 06/20/2017   Current Outpatient Medications on File Prior to Visit  Medication Sig  . aspirin EC 81 MG tablet Take 81 mg by mouth daily.  . Cholecalciferol (VITAMIN D3) 50000 UNITS CAPS Take 50,000 Units by mouth. With dialysis  . Cyanocobalamin (VITAMIN B 12 PO) Take 1,000 mcg by mouth daily. With dialysis  . ezetimibe (ZETIA) 10 MG tablet Take 1 tablet (10 mg total) by mouth daily.  Marland Kitchen glucose blood (PRODIGY NO CODING BLOOD GLUC) test strip 1 each.  . levothyroxine (SYNTHROID, LEVOTHROID) 150 MCG tablet Take 1 tablet (150 mcg total) by mouth daily before breakfast.  . OVER THE COUNTER MEDICATION Iron 65 mg 1 daily with dialysis  . sucroferric oxyhydroxide (VELPHORO) 500 MG chewable tablet Chew 500 mg by mouth 3 (three) times daily with meals.   No current facility-administered medications on file prior to visit.    Allergies  Allergen Reactions  . Hydralazine Shortness Of Breath    Chest pain fatigue  . Azor [Amlodipine-Olmesartan] Other (See Comments)    Sharp chest pain  Sharp chest pain  Chest pain  . Hydrochlorothiazide W-Triamterene   . Labetalol     Per patient, caused foot pain  . Lisinopril Other (See Comments)    Severe radiating foot pain.  . Metformin Itching    Loss of bowel control, general sick feeling  . Metoclopramide Other (See Comments)  . Other Other (See Comments)    "contractions in throat" Diazide pt states cause throat to swell.  Marland Kitchen  Minoxidil Rash    Choking, neck contractions fatigue  . Spironolactone Other (See Comments) and Rash    Severe radiating pain in feet Severe radiating pain in feet.  . Statins Rash    Fatigue Cause fatigue   Past Medical History:  Diagnosis Date  . Allergy   . Blood transfusion without reported diagnosis   . Cataract    bilateral  . Diabetes mellitus without complication ([place])   . Diabetic retinopathy ([place])   . ESRD (end stage renal disease) ([place])   . Hashimoto's disease    . Hyperlipidemia   . Hypertension   . Hypothyroid   . Loss of vision    both eyes, no vision left eye and little in left    Health Maintenance  Topic Date Due  . TETANUS/TDAP  08/24/1970  . DEXA SCAN  08/23/2016  . PNA vac Low Risk Adult (1 of 2 - PCV13) 08/23/2016  . OPHTHALMOLOGY EXAM  03/08/2017  . INFLUENZA VACCINE  11/29/2017  . HEMOGLOBIN A1C  12/18/2017  . FOOT EXAM  02/21/2018  . MAMMOGRAM  11/16/2018  . COLONOSCOPY  08/22/2025  . Hepatitis C Screening  Completed  . URINE MICROALBUMIN  Discontinued   Immunization History  Administered Date(s) Administered  . Influenza Whole 04/23/2007  . Influenza-Unspecified 01/05/2014, 02/28/2016  . Pneumococcal-Unspecified 01/05/2010   Last Colon - 08/23/2015 - Dr Loletha Carrow - recc 10 yr f/U due Apr 2027  Last MGM - 11/15/2016   Past Surgical History:  Procedure Laterality Date  . ABDOMINAL HYSTERECTOMY    . BREAST BIOPSY Right 2015   benign  . COLONOSCOPY    . EYE SURGERY    . KNEE SURGERY     Family History  Problem Relation Age of Onset  . Breast cancer Sister   . CAD Unknown        No family history  . Colon polyps Maternal Aunt   . Breast cancer Maternal Aunt   . Colon polyps Maternal Uncle   . Breast cancer Maternal Grandmother   . Breast cancer Maternal Aunt   . Colon cancer Neg Hx    Social History   Tobacco Use  . Smoking status: Never Smoker  . Smokeless tobacco: Never Used  Substance Use Topics  . Alcohol use: No    Alcohol/week: 0.0 oz  . Drug use: Not on file    ROS Constitutional: Denies fever, chills, weight loss/gain, headaches, insomnia,  night sweats, and change in appetite. Does c/o fatigue. Eyes: Denies redness, blurred vision, diplopia, discharge, itchy, watery eyes.  ENT: Denies discharge, congestion, post nasal drip, epistaxis, sore throat, earache, hearing loss, dental pain, Tinnitus, Vertigo, Sinus pain, snoring.  Cardio: Denies chest pain, palpitations, irregular heartbeat, syncope,  dyspnea, diaphoresis, orthopnea, PND, claudication, edema Respiratory: denies cough, dyspnea, DOE, pleurisy, hoarseness, laryngitis, wheezing.  Gastrointestinal: Denies dysphagia, heartburn, reflux, water brash, pain, cramps, nausea, vomiting, bloating, diarrhea, constipation, hematemesis, melena, hematochezia, jaundice, hemorrhoids Genitourinary: Denies dysuria, frequency, urgency, nocturia, hesitancy, discharge, hematuria, flank pain Breast: Breast lumps, nipple discharge, bleeding.  Musculoskeletal: Denies arthralgia, myalgia, stiffness, Jt. Swelling, pain, limp, and strain/sprain. Denies falls. Skin: Denies puritis, rash, hives, warts, acne, eczema, changing in skin lesion Neuro: No weakness, tremor, incoordination, spasms, paresthesia, pain Psychiatric: Denies confusion, memory loss, sensory loss. Denies Depression. Endocrine: Denies change in weight, skin, hair change, nocturia, and paresthesia, diabetic polys, visual blurring, hyper / hypo glycemic episodes.  Heme/Lymph: No excessive bleeding, bruising, enlarged lymph nodes.  Physical Exam  BP 124/76  Pulse 80   Temp (!) 97.5 F (36.4 C)   Resp 16   Ht 5\' 6"  (1.676 m)   Wt 149 lb (67.6 kg)   LMP  (LMP Unknown)   BMI 24.05 kg/m   General Appearance: Well nourished, well groomed and in no apparent distress.  Eyes: PERRLA, EOMs, conjunctiva no swelling or erythema, normal fundi and vessels. Sinuses: No frontal/maxillary tenderness ENT/Mouth: EACs patent / TMs  nl. Nares clear without erythema, swelling, mucoid exudates. Oral hygiene is good. No erythema, swelling, or exudate. Tongue normal, non-obstructing. Tonsils not swollen or erythematous. Hearing normal.  Neck: Supple, thyroid not palpable. No bruits, nodes or JVD. Respiratory: Respiratory effort normal.  BS equal and clear bilateral without rales, rhonci, wheezing or stridor. Cardio: Heart sounds are normal with regular rate and rhythm and no murmurs, rubs or gallops.  Peripheral pulses are normal and equal bilaterally without edema. No aortic or femoral bruits. Chest: symmetric with normal excursions and percussion. Breasts: Symmetric, without lumps, nipple discharge, retractions, or fibrocystic changes.  Abdomen: Flat, soft with bowel sounds active. Nontender, no guarding, rebound, hernias, masses, or organomegaly.  Lymphatics: Non tender without lymphadenopathy.  Genitourinary:  Musculoskeletal: Full ROM all peripheral extremities, joint stability, 5/5 strength, and normal gait. Skin: Warm and dry without rashes, lesions, cyanosis, clubbing or  ecchymosis.  Neuro: Cranial nerves intact, reflexes equal bilaterally. Normal muscle tone, no cerebellar symptoms. Sensation intact.  Pysch: Alert and oriented X 3, normal affect, Insight and Judgment appropriate.   Assessment and Plan  1. Essential hypertension  - EKG 12-Lead - CBC with Differential/Platelet - COMPLETE METABOLIC PANEL WITH GFR - Magnesium - TSH  2. Hyperlipidemia, mixed  - EKG 12-Lead - COMPLETE METABOLIC PANEL WITH GFR - Lipid panel - TSH  3. Type 2 diabetes mellitus with chronic kidney disease on chronic dialysis, without long-term current use of insulin (HCC)  - EKG 12-Lead - HM DIABETES FOOT EXAM - LOW EXTREMITY NEUR EXAM DOCUM - Hemoglobin A1c - Insulin, random  4. Vitamin D deficiency  - VITAMIN D 25 Hydroxyl  5. Hypothyroidism due to Hashimoto's thyroiditis  - TSH  6. Proliferative diabetic retinopathy with macular edema associated with type 2 diabetes mellitus, unspecified laterality (HCC)  - Hemoglobin A1c  7. Hyperparathyroidism, secondary renal (HCC)  - COMPLETE METABOLIC PANEL WITH GFR  8. End stage renal disease on dialysis due to type 2 diabetes mellitus ([place])   9. Screening for ischemic heart disease  - EKG 12-Lead  10. Aortic atherosclerosis ([place])   11. Screening for colorectal cancer  - POC Hemoccult Bld/Stl  12. Medication  management  - CBC with Differential/Platelet - COMPLETE METABOLIC PANEL WITH GFR - Magnesium - Lipid panel - TSH - Hemoglobin A1c - VITAMIN D 25 Hydroxyl - Insulin, random       Patient was counseled in prudent diet to achieve,  BP monitoring, regular exercise and medications. Discussed med's effects and SE's. Screening labs and tests as requested with regular follow-up as recommended. Over 40 minutes of exam, counseling, chart review and high complex critical decision making was performed.

## 2017-10-04 DIAGNOSIS — D631 Anemia in chronic kidney disease: Secondary | ICD-10-CM | POA: Diagnosis not present

## 2017-10-04 DIAGNOSIS — E1122 Type 2 diabetes mellitus with diabetic chronic kidney disease: Secondary | ICD-10-CM | POA: Diagnosis not present

## 2017-10-04 DIAGNOSIS — D509 Iron deficiency anemia, unspecified: Secondary | ICD-10-CM | POA: Diagnosis not present

## 2017-10-04 DIAGNOSIS — N186 End stage renal disease: Secondary | ICD-10-CM | POA: Diagnosis not present

## 2017-10-04 DIAGNOSIS — N2581 Secondary hyperparathyroidism of renal origin: Secondary | ICD-10-CM | POA: Diagnosis not present

## 2017-10-04 LAB — COMPLETE METABOLIC PANEL WITH GFR
AG Ratio: 1.4 (calc) (ref 1.0–2.5)
ALT: 13 U/L (ref 6–29)
AST: 12 U/L (ref 10–35)
Albumin: 4.3 g/dL (ref 3.6–5.1)
Alkaline phosphatase (APISO): 50 U/L (ref 33–130)
BILIRUBIN TOTAL: 0.4 mg/dL (ref 0.2–1.2)
BUN/Creatinine Ratio: 5 (calc) — ABNORMAL LOW (ref 6–22)
BUN: 44 mg/dL — ABNORMAL HIGH (ref 7–25)
CALCIUM: 9.4 mg/dL (ref 8.6–10.4)
CHLORIDE: 95 mmol/L — AB (ref 98–110)
CO2: 35 mmol/L — ABNORMAL HIGH (ref 20–32)
Creat: 8.57 mg/dL — ABNORMAL HIGH (ref 0.50–0.99)
GFR, EST AFRICAN AMERICAN: 5 mL/min/{1.73_m2} — AB (ref >=60)
GFR, EST NON AFRICAN AMERICAN: 4 mL/min/{1.73_m2} — AB (ref >=60)
GLOBULIN: 3.1 g/dL (ref 1.9–3.7)
Glucose, Bld: 135 mg/dL — ABNORMAL HIGH (ref 65–99)
Potassium: 4.8 mmol/L (ref 3.5–5.3)
SODIUM: 145 mmol/L (ref 135–146)
Total Protein: 7.4 g/dL (ref 6.1–8.1)

## 2017-10-04 LAB — CBC WITH DIFFERENTIAL/PLATELET
BASOS ABS: 63 {cells}/uL (ref 0–200)
Basophils Relative: 1 %
EOS ABS: 132 {cells}/uL (ref 15–500)
Eosinophils Relative: 2.1 %
HEMATOCRIT: 33 % — AB (ref 35.0–45.0)
Hemoglobin: 11.3 g/dL — ABNORMAL LOW (ref 11.7–15.5)
LYMPHS ABS: 2041 {cells}/uL (ref 850–3900)
MCH: 31.2 pg (ref 27.0–33.0)
MCHC: 34.2 g/dL (ref 32.0–36.0)
MCV: 91.2 fL (ref 80.0–100.0)
MPV: 11.9 fL (ref 7.5–12.5)
Monocytes Relative: 8 %
NEUTROS PCT: 56.5 %
Neutro Abs: 3560 cells/uL (ref 1500–7800)
PLATELETS: 214 10*3/uL (ref 140–400)
RBC: 3.62 10*6/uL — AB (ref 3.80–5.10)
RDW: 14.7 % (ref 11.0–15.0)
TOTAL LYMPHOCYTE: 32.4 %
WBC: 6.3 10*3/uL (ref 3.8–10.8)
WBCMIX: 504 {cells}/uL (ref 200–950)

## 2017-10-04 LAB — LIPID PANEL
CHOL/HDL RATIO: 5 (calc) — AB (ref <5.0)
CHOLESTEROL: 199 mg/dL (ref <200)
HDL: 40 mg/dL — ABNORMAL LOW (ref >50)
LDL Cholesterol (Calc): 123 mg/dL (calc) — ABNORMAL HIGH
NON-HDL CHOLESTEROL (CALC): 159 mg/dL — AB (ref <130)
Triglycerides: 238 mg/dL — ABNORMAL HIGH (ref <150)

## 2017-10-04 LAB — MAGNESIUM: Magnesium: 2.4 mg/dL (ref 1.5–2.5)

## 2017-10-04 LAB — TSH: TSH: 1.1 mIU/L (ref 0.40–4.50)

## 2017-10-04 LAB — HEMOGLOBIN A1C
EAG (MMOL/L): 8.5 (calc)
HEMOGLOBIN A1C: 7 %{Hb} — AB (ref <5.7)
MEAN PLASMA GLUCOSE: 154 (calc)

## 2017-10-04 LAB — INSULIN, RANDOM: Insulin: 16.9 u[IU]/mL (ref 2.0–19.6)

## 2017-10-04 LAB — VITAMIN D 25 HYDROXY (VIT D DEFICIENCY, FRACTURES): Vit D, 25-Hydroxy: 27 ng/mL — ABNORMAL LOW (ref 30–100)

## 2017-10-05 ENCOUNTER — Other Ambulatory Visit: Payer: Self-pay | Admitting: Internal Medicine

## 2017-10-05 DIAGNOSIS — E782 Mixed hyperlipidemia: Secondary | ICD-10-CM

## 2017-10-05 MED ORDER — ROSUVASTATIN CALCIUM 40 MG PO TABS: ORAL_TABLET | ORAL | 5 refills | Status: DC

## 2017-10-06 DIAGNOSIS — N2581 Secondary hyperparathyroidism of renal origin: Secondary | ICD-10-CM | POA: Diagnosis not present

## 2017-10-06 DIAGNOSIS — D509 Iron deficiency anemia, unspecified: Secondary | ICD-10-CM | POA: Diagnosis not present

## 2017-10-06 DIAGNOSIS — D631 Anemia in chronic kidney disease: Secondary | ICD-10-CM | POA: Diagnosis not present

## 2017-10-06 DIAGNOSIS — E1122 Type 2 diabetes mellitus with diabetic chronic kidney disease: Secondary | ICD-10-CM | POA: Diagnosis not present

## 2017-10-06 DIAGNOSIS — N186 End stage renal disease: Secondary | ICD-10-CM | POA: Diagnosis not present

## 2017-10-07 ENCOUNTER — Encounter: Payer: Self-pay | Admitting: Internal Medicine

## 2017-10-09 DIAGNOSIS — D631 Anemia in chronic kidney disease: Secondary | ICD-10-CM | POA: Diagnosis not present

## 2017-10-09 DIAGNOSIS — N2581 Secondary hyperparathyroidism of renal origin: Secondary | ICD-10-CM | POA: Diagnosis not present

## 2017-10-09 DIAGNOSIS — D509 Iron deficiency anemia, unspecified: Secondary | ICD-10-CM | POA: Diagnosis not present

## 2017-10-09 DIAGNOSIS — E1122 Type 2 diabetes mellitus with diabetic chronic kidney disease: Secondary | ICD-10-CM | POA: Diagnosis not present

## 2017-10-09 DIAGNOSIS — N186 End stage renal disease: Secondary | ICD-10-CM | POA: Diagnosis not present

## 2017-10-11 DIAGNOSIS — N2581 Secondary hyperparathyroidism of renal origin: Secondary | ICD-10-CM | POA: Diagnosis not present

## 2017-10-11 DIAGNOSIS — N186 End stage renal disease: Secondary | ICD-10-CM | POA: Diagnosis not present

## 2017-10-11 DIAGNOSIS — E1122 Type 2 diabetes mellitus with diabetic chronic kidney disease: Secondary | ICD-10-CM | POA: Diagnosis not present

## 2017-10-11 DIAGNOSIS — D631 Anemia in chronic kidney disease: Secondary | ICD-10-CM | POA: Diagnosis not present

## 2017-10-11 DIAGNOSIS — D509 Iron deficiency anemia, unspecified: Secondary | ICD-10-CM | POA: Diagnosis not present

## 2017-10-12 ENCOUNTER — Ambulatory Visit (INDEPENDENT_AMBULATORY_CARE_PROVIDER_SITE_OTHER): Payer: Medicare Other | Admitting: Podiatry

## 2017-10-12 ENCOUNTER — Encounter: Payer: Self-pay | Admitting: Podiatry

## 2017-10-12 DIAGNOSIS — B351 Tinea unguium: Secondary | ICD-10-CM

## 2017-10-12 DIAGNOSIS — E1040 Type 1 diabetes mellitus with diabetic neuropathy, unspecified: Secondary | ICD-10-CM | POA: Diagnosis not present

## 2017-10-12 NOTE — Progress Notes (Signed)
Patient ID: Denise Bush, female   DOB: 1950/09/20, 67 y.o.   MRN: [id number] Complaint:  Visit Type: Patient returns to my office for continued preventative foot care services. Complaint: Patient states" my nails have grown long and thick and become painful to walk and wear shoes" Patient has been diagnosed with DM with no foot complications. The patient presents for preventative foot care services. No changes to ROS.  She is under dialysis. Podiatric Exam: Vascular: dorsalis pedis and posterior tibial pulses are palpable bilateral. Capillary return is immediate. Temperature gradient is WNL. Skin turgor WNL  Sensorium: Diminished  Semmes Weinstein monofilament test. Normal tactile sensation bilaterally. Nail Exam: Pt has thick disfigured discolored nails with subungual debris noted bilateral entire nail hallux through fifth toenails Ulcer Exam: There is no evidence of ulcer or pre-ulcerative changes or infection. Orthopedic Exam: Muscle tone and strength are WNL. No limitations in general ROM. No crepitus or effusions noted. Foot type and digits show no abnormalities. Bony prominences are unremarkable. Skin:  Asymptomatic  Porokeratosis sub 5th met left foot. No infection or ulcers  Diagnosis:  Onychomycosis, , Pain in right toe, pain in left toes, .  Treatment & Plan Procedures and Treatment: Consent by patient was obtained for treatment procedures. The patient understood the discussion of treatment and procedures well. All questions were answered thoroughly reviewed. Debridement of mycotic and hypertrophic toenails, 1 through 5 bilateral and clearing of subungual debris. No ulceration, no infection noted.   ABN signed for 2019.Marland Kitchen Return Visit-Office Procedure: Patient instructed to return to the office for a follow up visit 4 months for continued evaluation and treatment.Gardiner Barefoot DPM

## 2017-10-13 DIAGNOSIS — D631 Anemia in chronic kidney disease: Secondary | ICD-10-CM | POA: Diagnosis not present

## 2017-10-13 DIAGNOSIS — N186 End stage renal disease: Secondary | ICD-10-CM | POA: Diagnosis not present

## 2017-10-13 DIAGNOSIS — N2581 Secondary hyperparathyroidism of renal origin: Secondary | ICD-10-CM | POA: Diagnosis not present

## 2017-10-13 DIAGNOSIS — D509 Iron deficiency anemia, unspecified: Secondary | ICD-10-CM | POA: Diagnosis not present

## 2017-10-13 DIAGNOSIS — E1122 Type 2 diabetes mellitus with diabetic chronic kidney disease: Secondary | ICD-10-CM | POA: Diagnosis not present

## 2017-10-15 DIAGNOSIS — H53413 Scotoma involving central area, bilateral: Secondary | ICD-10-CM | POA: Diagnosis not present

## 2017-10-16 DIAGNOSIS — D631 Anemia in chronic kidney disease: Secondary | ICD-10-CM | POA: Diagnosis not present

## 2017-10-16 DIAGNOSIS — E1122 Type 2 diabetes mellitus with diabetic chronic kidney disease: Secondary | ICD-10-CM | POA: Diagnosis not present

## 2017-10-16 DIAGNOSIS — N186 End stage renal disease: Secondary | ICD-10-CM | POA: Diagnosis not present

## 2017-10-16 DIAGNOSIS — N2581 Secondary hyperparathyroidism of renal origin: Secondary | ICD-10-CM | POA: Diagnosis not present

## 2017-10-16 DIAGNOSIS — D509 Iron deficiency anemia, unspecified: Secondary | ICD-10-CM | POA: Diagnosis not present

## 2017-10-18 DIAGNOSIS — N2581 Secondary hyperparathyroidism of renal origin: Secondary | ICD-10-CM | POA: Diagnosis not present

## 2017-10-18 DIAGNOSIS — D509 Iron deficiency anemia, unspecified: Secondary | ICD-10-CM | POA: Diagnosis not present

## 2017-10-18 DIAGNOSIS — D631 Anemia in chronic kidney disease: Secondary | ICD-10-CM | POA: Diagnosis not present

## 2017-10-18 DIAGNOSIS — E1122 Type 2 diabetes mellitus with diabetic chronic kidney disease: Secondary | ICD-10-CM | POA: Diagnosis not present

## 2017-10-18 DIAGNOSIS — N186 End stage renal disease: Secondary | ICD-10-CM | POA: Diagnosis not present

## 2017-10-20 DIAGNOSIS — D509 Iron deficiency anemia, unspecified: Secondary | ICD-10-CM | POA: Diagnosis not present

## 2017-10-20 DIAGNOSIS — E1122 Type 2 diabetes mellitus with diabetic chronic kidney disease: Secondary | ICD-10-CM | POA: Diagnosis not present

## 2017-10-20 DIAGNOSIS — N2581 Secondary hyperparathyroidism of renal origin: Secondary | ICD-10-CM | POA: Diagnosis not present

## 2017-10-20 DIAGNOSIS — N186 End stage renal disease: Secondary | ICD-10-CM | POA: Diagnosis not present

## 2017-10-20 DIAGNOSIS — D631 Anemia in chronic kidney disease: Secondary | ICD-10-CM | POA: Diagnosis not present

## 2017-10-22 DIAGNOSIS — H53413 Scotoma involving central area, bilateral: Secondary | ICD-10-CM | POA: Diagnosis not present

## 2017-10-23 DIAGNOSIS — D509 Iron deficiency anemia, unspecified: Secondary | ICD-10-CM | POA: Diagnosis not present

## 2017-10-23 DIAGNOSIS — N2581 Secondary hyperparathyroidism of renal origin: Secondary | ICD-10-CM | POA: Diagnosis not present

## 2017-10-23 DIAGNOSIS — D631 Anemia in chronic kidney disease: Secondary | ICD-10-CM | POA: Diagnosis not present

## 2017-10-23 DIAGNOSIS — E1122 Type 2 diabetes mellitus with diabetic chronic kidney disease: Secondary | ICD-10-CM | POA: Diagnosis not present

## 2017-10-23 DIAGNOSIS — N186 End stage renal disease: Secondary | ICD-10-CM | POA: Diagnosis not present

## 2017-10-25 DIAGNOSIS — N2581 Secondary hyperparathyroidism of renal origin: Secondary | ICD-10-CM | POA: Diagnosis not present

## 2017-10-25 DIAGNOSIS — D509 Iron deficiency anemia, unspecified: Secondary | ICD-10-CM | POA: Diagnosis not present

## 2017-10-25 DIAGNOSIS — E1122 Type 2 diabetes mellitus with diabetic chronic kidney disease: Secondary | ICD-10-CM | POA: Diagnosis not present

## 2017-10-25 DIAGNOSIS — D631 Anemia in chronic kidney disease: Secondary | ICD-10-CM | POA: Diagnosis not present

## 2017-10-25 DIAGNOSIS — N186 End stage renal disease: Secondary | ICD-10-CM | POA: Diagnosis not present

## 2017-10-29 DIAGNOSIS — N186 End stage renal disease: Secondary | ICD-10-CM | POA: Diagnosis not present

## 2017-10-29 DIAGNOSIS — H53413 Scotoma involving central area, bilateral: Secondary | ICD-10-CM | POA: Diagnosis not present

## 2017-10-29 DIAGNOSIS — Z992 Dependence on renal dialysis: Secondary | ICD-10-CM | POA: Diagnosis not present

## 2017-10-29 DIAGNOSIS — E1022 Type 1 diabetes mellitus with diabetic chronic kidney disease: Secondary | ICD-10-CM | POA: Diagnosis not present

## 2017-10-30 DIAGNOSIS — N2581 Secondary hyperparathyroidism of renal origin: Secondary | ICD-10-CM | POA: Diagnosis not present

## 2017-10-30 DIAGNOSIS — E1122 Type 2 diabetes mellitus with diabetic chronic kidney disease: Secondary | ICD-10-CM | POA: Diagnosis not present

## 2017-10-30 DIAGNOSIS — N186 End stage renal disease: Secondary | ICD-10-CM | POA: Diagnosis not present

## 2017-10-30 DIAGNOSIS — D509 Iron deficiency anemia, unspecified: Secondary | ICD-10-CM | POA: Diagnosis not present

## 2017-11-01 DIAGNOSIS — E1122 Type 2 diabetes mellitus with diabetic chronic kidney disease: Secondary | ICD-10-CM | POA: Diagnosis not present

## 2017-11-01 DIAGNOSIS — N186 End stage renal disease: Secondary | ICD-10-CM | POA: Diagnosis not present

## 2017-11-01 DIAGNOSIS — D509 Iron deficiency anemia, unspecified: Secondary | ICD-10-CM | POA: Diagnosis not present

## 2017-11-01 DIAGNOSIS — N2581 Secondary hyperparathyroidism of renal origin: Secondary | ICD-10-CM | POA: Diagnosis not present

## 2017-11-03 DIAGNOSIS — D509 Iron deficiency anemia, unspecified: Secondary | ICD-10-CM | POA: Diagnosis not present

## 2017-11-03 DIAGNOSIS — N2581 Secondary hyperparathyroidism of renal origin: Secondary | ICD-10-CM | POA: Diagnosis not present

## 2017-11-03 DIAGNOSIS — E1122 Type 2 diabetes mellitus with diabetic chronic kidney disease: Secondary | ICD-10-CM | POA: Diagnosis not present

## 2017-11-03 DIAGNOSIS — N186 End stage renal disease: Secondary | ICD-10-CM | POA: Diagnosis not present

## 2017-11-06 DIAGNOSIS — N2581 Secondary hyperparathyroidism of renal origin: Secondary | ICD-10-CM | POA: Diagnosis not present

## 2017-11-06 DIAGNOSIS — D509 Iron deficiency anemia, unspecified: Secondary | ICD-10-CM | POA: Diagnosis not present

## 2017-11-06 DIAGNOSIS — E1122 Type 2 diabetes mellitus with diabetic chronic kidney disease: Secondary | ICD-10-CM | POA: Diagnosis not present

## 2017-11-06 DIAGNOSIS — N186 End stage renal disease: Secondary | ICD-10-CM | POA: Diagnosis not present

## 2017-11-07 IMAGING — CT CT ABD-PELV W/O CM
3 of 4 series · 13 of 36 positions shown, 19 images · IV contrast (READICAT/WATER)
Comparison: None.

CLINICAL DATA: End-stage renal disease, on dialysis, preop kidney
transplant

EXAM:
CT ABDOMEN AND PELVIS WITHOUT CONTRAST
TECHNIQUE: Multidetector CT imaging of the abdomen and pelvis was performed
following the standard protocol without IV contrast.

[Series 3: abd/pelvis w/o · axial · non-contrast · 0.77mm/px · z∈[-310,+20]mm · 8 of 86 slices shown, 13 images]
[im 10/86  soft-tissue]
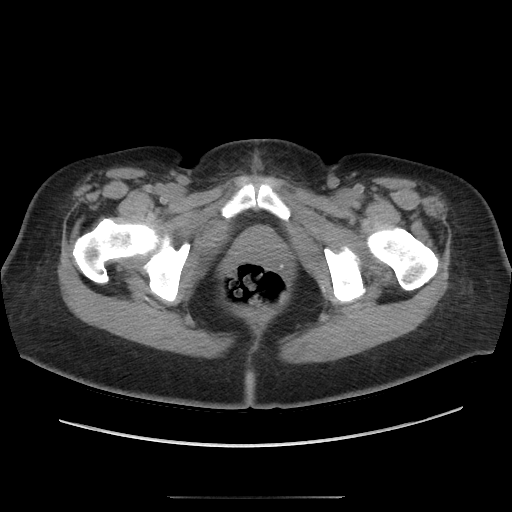
[im 10/86  bone]
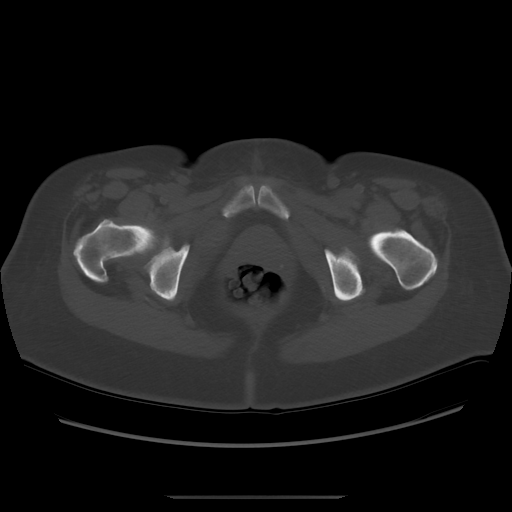
[im 19/86  soft-tissue]
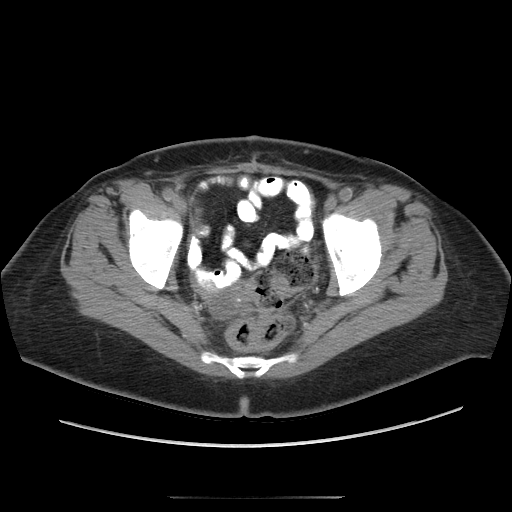
[im 29/86  soft-tissue]
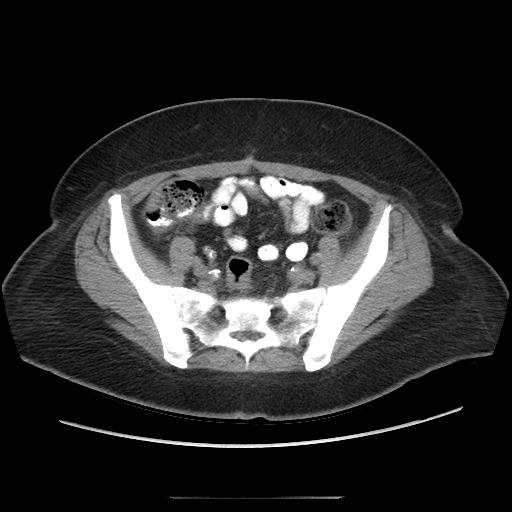
[im 38/86  soft-tissue]
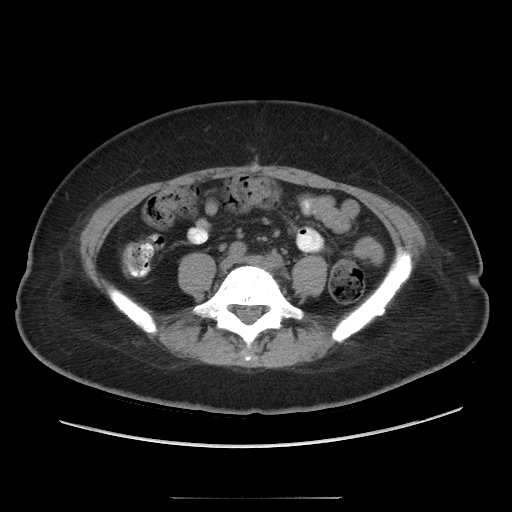
[im 48/86  soft-tissue]
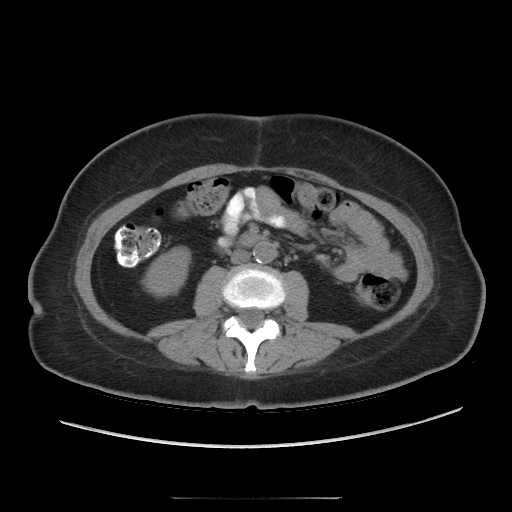
[im 48/86  lung]
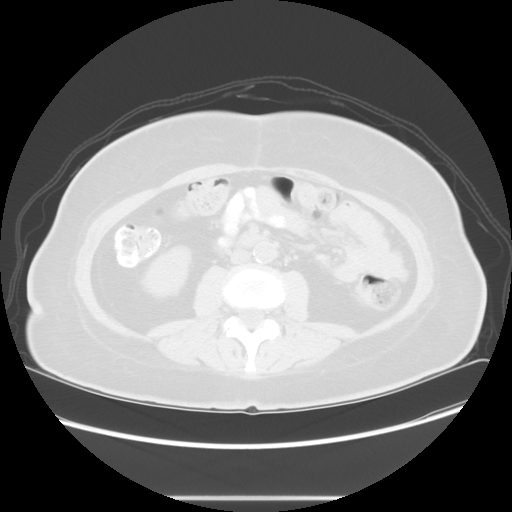
[im 57/86  soft-tissue]
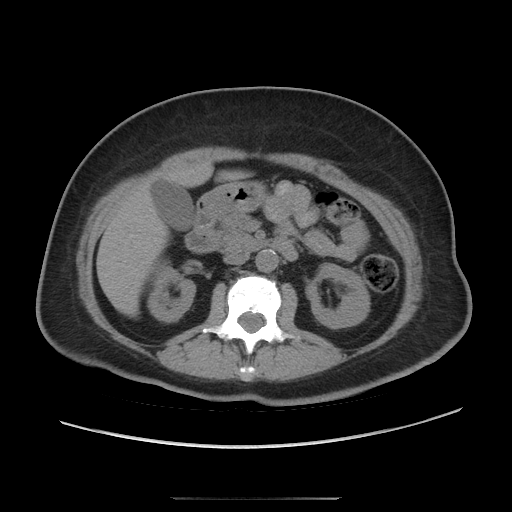
[im 57/86  lung]
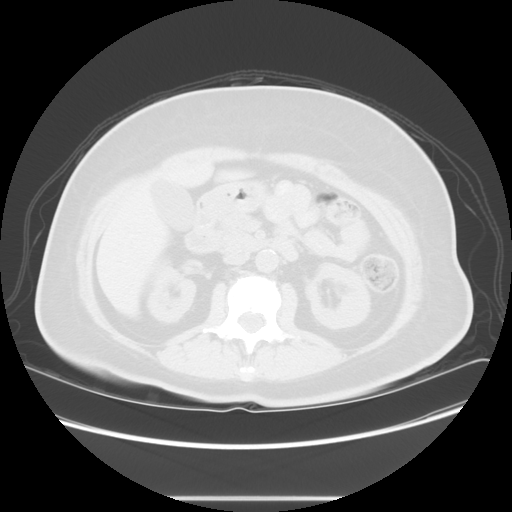
[im 67/86  soft-tissue]
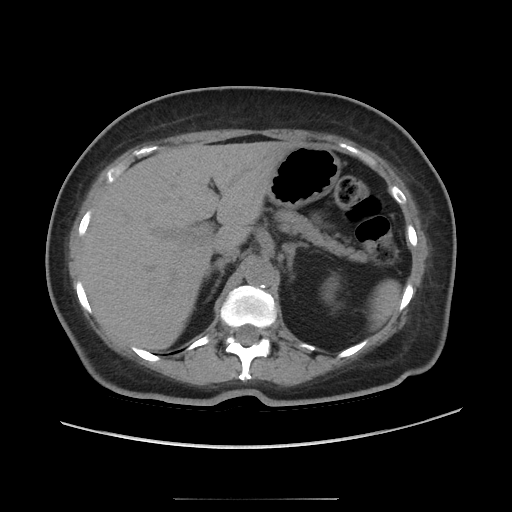
[im 67/86  lung]
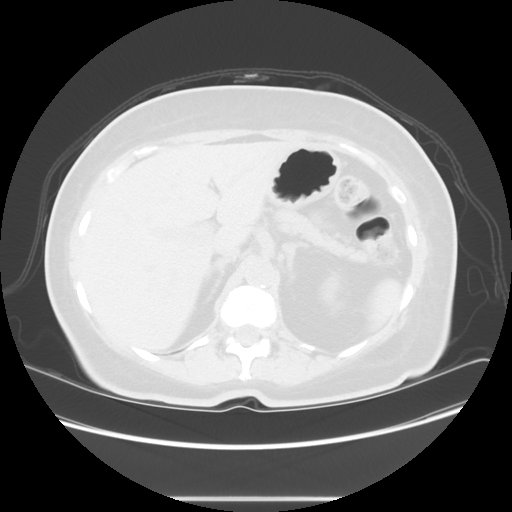
[im 76/86  soft-tissue]
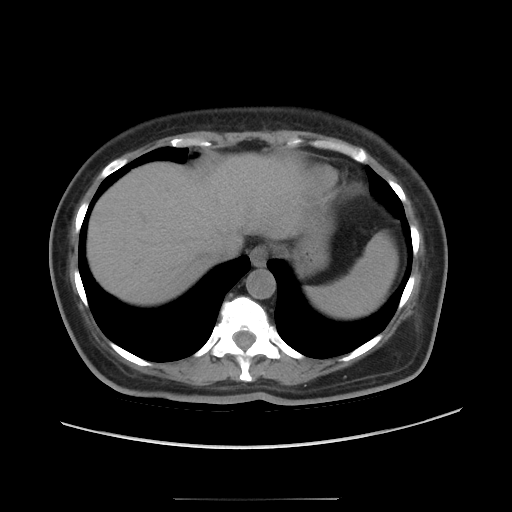
[im 76/86  lung]
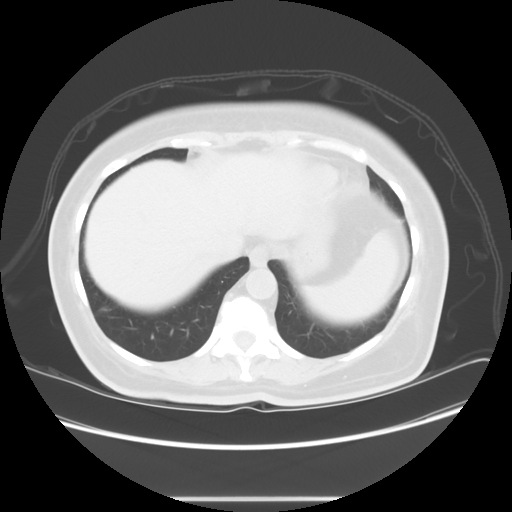

[Series 601: coronal body · coronal · 0.87mm/px · 1 of 106 slices shown, 2 images]
[im 36/106  soft-tissue]
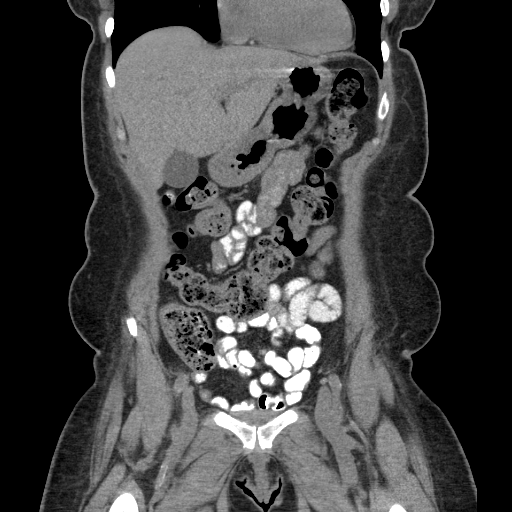
[im 36/106  bone]
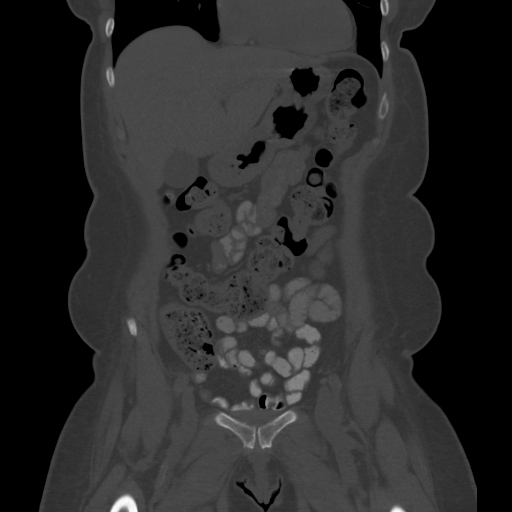

[Series 602: sagittal body · sagittal · 0.87mm/px · 4 of 159 slices shown]
[im 18/159  soft-tissue]
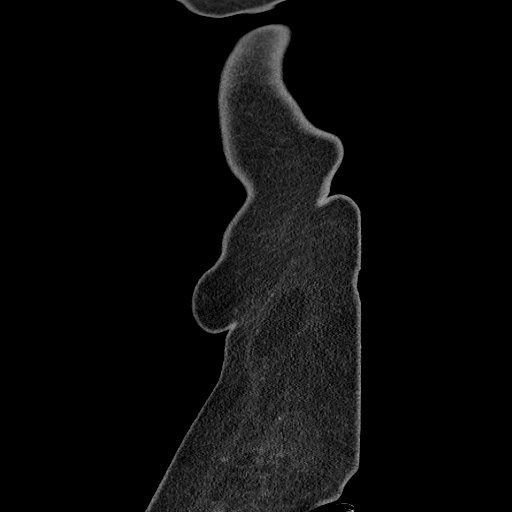
[im 36/159  soft-tissue]
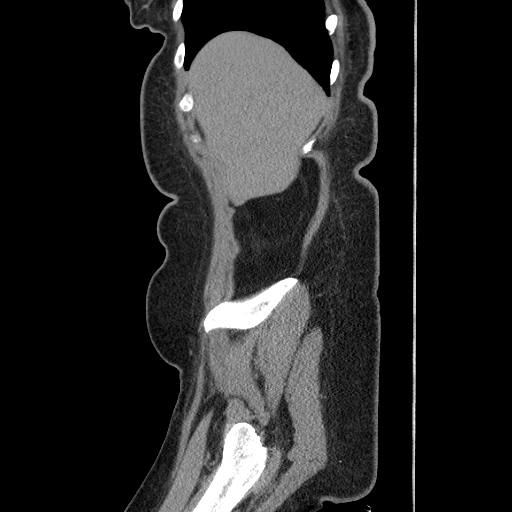
[im 53/159  soft-tissue]
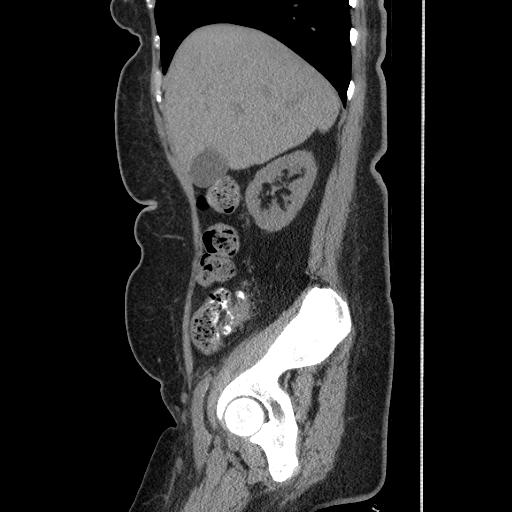
[im 71/159  soft-tissue]
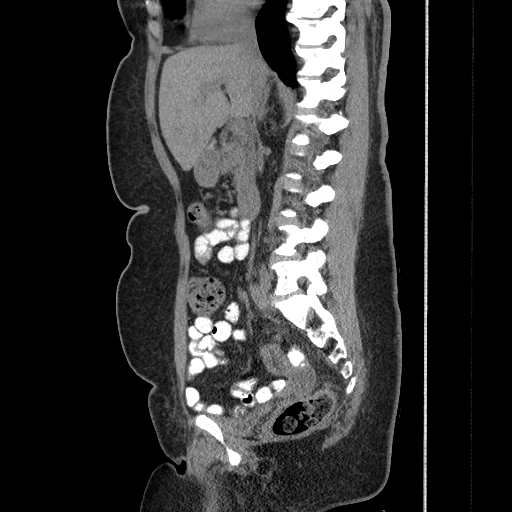

[13 of 36 positions shown; findings below may reference images not displayed]

FINDINGS: Lower chest:  Mild linear scarring in the left lower lobe.

Hepatobiliary: Calcified hepatic granuloma (series 3/ image 12).
Unenhanced liver is otherwise unremarkable.

Gallbladder is unremarkable. No intrahepatic or extrahepatic ductal
dilatation.

Pancreas: Within normal limits.

Spleen: Within normal limits.

Adrenals/Urinary Tract: Adrenal glands are within normal limits.

Kidneys are within normal limits.

Punctate bilateral nonobstructive renal calculi versus renal
vascular calcifications (coronal images 54, 58, 62-63, and 66). No
ureteral or bladder calculi. No hydronephrosis.

Bladder is within normal limits.

Stomach/Bowel: Stomach is within normal limits.

No evidence of bowel obstruction.

Normal appendix (series 3/ image 60).

Vascular/Lymphatic: Atherosclerotic calcifications of the abdominal
aorta.

Mild atherosclerotic calcifications of the bilateral internal iliac
arteries. Common and external iliac arteries are notable for only
minimal atherosclerotic calcifications.

No suspicious abdominopelvic lymphadenopathy.

Reproductive: Status post hysterectomy.

2.2 cm right ovarian cyst/follicle.

No left adnexal mass.

Other: No abdominopelvic ascites.

Musculoskeletal: Mild degenerative changes of the lower thoracic
spine.
IMPRESSION: Punctate bilateral nonobstructing renal calculi versus renal
vascular calcifications. No ureteral or bladder calculi. No
hydronephrosis.

Additional ancillary findings as above.

## 2017-11-08 DIAGNOSIS — D509 Iron deficiency anemia, unspecified: Secondary | ICD-10-CM | POA: Diagnosis not present

## 2017-11-08 DIAGNOSIS — N2581 Secondary hyperparathyroidism of renal origin: Secondary | ICD-10-CM | POA: Diagnosis not present

## 2017-11-08 DIAGNOSIS — N186 End stage renal disease: Secondary | ICD-10-CM | POA: Diagnosis not present

## 2017-11-08 DIAGNOSIS — E1122 Type 2 diabetes mellitus with diabetic chronic kidney disease: Secondary | ICD-10-CM | POA: Diagnosis not present

## 2017-11-10 DIAGNOSIS — N186 End stage renal disease: Secondary | ICD-10-CM | POA: Diagnosis not present

## 2017-11-10 DIAGNOSIS — D509 Iron deficiency anemia, unspecified: Secondary | ICD-10-CM | POA: Diagnosis not present

## 2017-11-10 DIAGNOSIS — E1122 Type 2 diabetes mellitus with diabetic chronic kidney disease: Secondary | ICD-10-CM | POA: Diagnosis not present

## 2017-11-10 DIAGNOSIS — N2581 Secondary hyperparathyroidism of renal origin: Secondary | ICD-10-CM | POA: Diagnosis not present

## 2017-11-13 DIAGNOSIS — N2581 Secondary hyperparathyroidism of renal origin: Secondary | ICD-10-CM | POA: Diagnosis not present

## 2017-11-13 DIAGNOSIS — D509 Iron deficiency anemia, unspecified: Secondary | ICD-10-CM | POA: Diagnosis not present

## 2017-11-13 DIAGNOSIS — E1122 Type 2 diabetes mellitus with diabetic chronic kidney disease: Secondary | ICD-10-CM | POA: Diagnosis not present

## 2017-11-13 DIAGNOSIS — N186 End stage renal disease: Secondary | ICD-10-CM | POA: Diagnosis not present

## 2017-11-15 DIAGNOSIS — D509 Iron deficiency anemia, unspecified: Secondary | ICD-10-CM | POA: Diagnosis not present

## 2017-11-15 DIAGNOSIS — N186 End stage renal disease: Secondary | ICD-10-CM | POA: Diagnosis not present

## 2017-11-15 DIAGNOSIS — E1122 Type 2 diabetes mellitus with diabetic chronic kidney disease: Secondary | ICD-10-CM | POA: Diagnosis not present

## 2017-11-15 DIAGNOSIS — N2581 Secondary hyperparathyroidism of renal origin: Secondary | ICD-10-CM | POA: Diagnosis not present

## 2017-11-17 DIAGNOSIS — D509 Iron deficiency anemia, unspecified: Secondary | ICD-10-CM | POA: Diagnosis not present

## 2017-11-17 DIAGNOSIS — E1122 Type 2 diabetes mellitus with diabetic chronic kidney disease: Secondary | ICD-10-CM | POA: Diagnosis not present

## 2017-11-17 DIAGNOSIS — N186 End stage renal disease: Secondary | ICD-10-CM | POA: Diagnosis not present

## 2017-11-17 DIAGNOSIS — N2581 Secondary hyperparathyroidism of renal origin: Secondary | ICD-10-CM | POA: Diagnosis not present

## 2017-11-20 DIAGNOSIS — N186 End stage renal disease: Secondary | ICD-10-CM | POA: Diagnosis not present

## 2017-11-20 DIAGNOSIS — N2581 Secondary hyperparathyroidism of renal origin: Secondary | ICD-10-CM | POA: Diagnosis not present

## 2017-11-20 DIAGNOSIS — E1122 Type 2 diabetes mellitus with diabetic chronic kidney disease: Secondary | ICD-10-CM | POA: Diagnosis not present

## 2017-11-20 DIAGNOSIS — D509 Iron deficiency anemia, unspecified: Secondary | ICD-10-CM | POA: Diagnosis not present

## 2017-11-22 DIAGNOSIS — N186 End stage renal disease: Secondary | ICD-10-CM | POA: Diagnosis not present

## 2017-11-22 DIAGNOSIS — N2581 Secondary hyperparathyroidism of renal origin: Secondary | ICD-10-CM | POA: Diagnosis not present

## 2017-11-22 DIAGNOSIS — E1122 Type 2 diabetes mellitus with diabetic chronic kidney disease: Secondary | ICD-10-CM | POA: Diagnosis not present

## 2017-11-22 DIAGNOSIS — D509 Iron deficiency anemia, unspecified: Secondary | ICD-10-CM | POA: Diagnosis not present

## 2017-11-22 DIAGNOSIS — E11319 Type 2 diabetes mellitus with unspecified diabetic retinopathy without macular edema: Secondary | ICD-10-CM | POA: Diagnosis not present

## 2017-11-24 DIAGNOSIS — N186 End stage renal disease: Secondary | ICD-10-CM | POA: Diagnosis not present

## 2017-11-24 DIAGNOSIS — N2581 Secondary hyperparathyroidism of renal origin: Secondary | ICD-10-CM | POA: Diagnosis not present

## 2017-11-24 DIAGNOSIS — D509 Iron deficiency anemia, unspecified: Secondary | ICD-10-CM | POA: Diagnosis not present

## 2017-11-24 DIAGNOSIS — E1122 Type 2 diabetes mellitus with diabetic chronic kidney disease: Secondary | ICD-10-CM | POA: Diagnosis not present

## 2017-11-26 DIAGNOSIS — H53413 Scotoma involving central area, bilateral: Secondary | ICD-10-CM | POA: Diagnosis not present

## 2017-11-27 DIAGNOSIS — N186 End stage renal disease: Secondary | ICD-10-CM | POA: Diagnosis not present

## 2017-11-27 DIAGNOSIS — N2581 Secondary hyperparathyroidism of renal origin: Secondary | ICD-10-CM | POA: Diagnosis not present

## 2017-11-27 DIAGNOSIS — D509 Iron deficiency anemia, unspecified: Secondary | ICD-10-CM | POA: Diagnosis not present

## 2017-11-27 DIAGNOSIS — E1122 Type 2 diabetes mellitus with diabetic chronic kidney disease: Secondary | ICD-10-CM | POA: Diagnosis not present

## 2017-11-29 DIAGNOSIS — N186 End stage renal disease: Secondary | ICD-10-CM | POA: Diagnosis not present

## 2017-11-29 DIAGNOSIS — N2581 Secondary hyperparathyroidism of renal origin: Secondary | ICD-10-CM | POA: Diagnosis not present

## 2017-11-29 DIAGNOSIS — D631 Anemia in chronic kidney disease: Secondary | ICD-10-CM | POA: Diagnosis not present

## 2017-11-29 DIAGNOSIS — E1022 Type 1 diabetes mellitus with diabetic chronic kidney disease: Secondary | ICD-10-CM | POA: Diagnosis not present

## 2017-11-29 DIAGNOSIS — Z992 Dependence on renal dialysis: Secondary | ICD-10-CM | POA: Diagnosis not present

## 2017-11-29 DIAGNOSIS — E1122 Type 2 diabetes mellitus with diabetic chronic kidney disease: Secondary | ICD-10-CM | POA: Diagnosis not present

## 2017-12-01 DIAGNOSIS — E1122 Type 2 diabetes mellitus with diabetic chronic kidney disease: Secondary | ICD-10-CM | POA: Diagnosis not present

## 2017-12-01 DIAGNOSIS — D631 Anemia in chronic kidney disease: Secondary | ICD-10-CM | POA: Diagnosis not present

## 2017-12-01 DIAGNOSIS — N186 End stage renal disease: Secondary | ICD-10-CM | POA: Diagnosis not present

## 2017-12-01 DIAGNOSIS — N2581 Secondary hyperparathyroidism of renal origin: Secondary | ICD-10-CM | POA: Diagnosis not present

## 2017-12-04 DIAGNOSIS — N2581 Secondary hyperparathyroidism of renal origin: Secondary | ICD-10-CM | POA: Diagnosis not present

## 2017-12-04 DIAGNOSIS — E1122 Type 2 diabetes mellitus with diabetic chronic kidney disease: Secondary | ICD-10-CM | POA: Diagnosis not present

## 2017-12-04 DIAGNOSIS — N186 End stage renal disease: Secondary | ICD-10-CM | POA: Diagnosis not present

## 2017-12-04 DIAGNOSIS — D631 Anemia in chronic kidney disease: Secondary | ICD-10-CM | POA: Diagnosis not present

## 2017-12-06 DIAGNOSIS — D631 Anemia in chronic kidney disease: Secondary | ICD-10-CM | POA: Diagnosis not present

## 2017-12-06 DIAGNOSIS — N2581 Secondary hyperparathyroidism of renal origin: Secondary | ICD-10-CM | POA: Diagnosis not present

## 2017-12-06 DIAGNOSIS — E1122 Type 2 diabetes mellitus with diabetic chronic kidney disease: Secondary | ICD-10-CM | POA: Diagnosis not present

## 2017-12-06 DIAGNOSIS — N186 End stage renal disease: Secondary | ICD-10-CM | POA: Diagnosis not present

## 2017-12-08 DIAGNOSIS — N2581 Secondary hyperparathyroidism of renal origin: Secondary | ICD-10-CM | POA: Diagnosis not present

## 2017-12-08 DIAGNOSIS — D631 Anemia in chronic kidney disease: Secondary | ICD-10-CM | POA: Diagnosis not present

## 2017-12-08 DIAGNOSIS — E1122 Type 2 diabetes mellitus with diabetic chronic kidney disease: Secondary | ICD-10-CM | POA: Diagnosis not present

## 2017-12-08 DIAGNOSIS — N186 End stage renal disease: Secondary | ICD-10-CM | POA: Diagnosis not present

## 2017-12-11 DIAGNOSIS — E1122 Type 2 diabetes mellitus with diabetic chronic kidney disease: Secondary | ICD-10-CM | POA: Diagnosis not present

## 2017-12-11 DIAGNOSIS — D631 Anemia in chronic kidney disease: Secondary | ICD-10-CM | POA: Diagnosis not present

## 2017-12-11 DIAGNOSIS — N186 End stage renal disease: Secondary | ICD-10-CM | POA: Diagnosis not present

## 2017-12-11 DIAGNOSIS — N2581 Secondary hyperparathyroidism of renal origin: Secondary | ICD-10-CM | POA: Diagnosis not present

## 2017-12-12 DIAGNOSIS — I871 Compression of vein: Secondary | ICD-10-CM | POA: Diagnosis not present

## 2017-12-12 DIAGNOSIS — T82858A Stenosis of vascular prosthetic devices, implants and grafts, initial encounter: Secondary | ICD-10-CM | POA: Diagnosis not present

## 2017-12-12 DIAGNOSIS — N186 End stage renal disease: Secondary | ICD-10-CM | POA: Diagnosis not present

## 2017-12-12 DIAGNOSIS — Z992 Dependence on renal dialysis: Secondary | ICD-10-CM | POA: Diagnosis not present

## 2017-12-13 DIAGNOSIS — N2581 Secondary hyperparathyroidism of renal origin: Secondary | ICD-10-CM | POA: Diagnosis not present

## 2017-12-13 DIAGNOSIS — N186 End stage renal disease: Secondary | ICD-10-CM | POA: Diagnosis not present

## 2017-12-13 DIAGNOSIS — D631 Anemia in chronic kidney disease: Secondary | ICD-10-CM | POA: Diagnosis not present

## 2017-12-13 DIAGNOSIS — E1122 Type 2 diabetes mellitus with diabetic chronic kidney disease: Secondary | ICD-10-CM | POA: Diagnosis not present

## 2017-12-15 DIAGNOSIS — N186 End stage renal disease: Secondary | ICD-10-CM | POA: Diagnosis not present

## 2017-12-15 DIAGNOSIS — Z992 Dependence on renal dialysis: Secondary | ICD-10-CM | POA: Diagnosis not present

## 2017-12-20 DIAGNOSIS — D631 Anemia in chronic kidney disease: Secondary | ICD-10-CM | POA: Diagnosis not present

## 2017-12-20 DIAGNOSIS — E1122 Type 2 diabetes mellitus with diabetic chronic kidney disease: Secondary | ICD-10-CM | POA: Diagnosis not present

## 2017-12-20 DIAGNOSIS — N186 End stage renal disease: Secondary | ICD-10-CM | POA: Diagnosis not present

## 2017-12-20 DIAGNOSIS — N2581 Secondary hyperparathyroidism of renal origin: Secondary | ICD-10-CM | POA: Diagnosis not present

## 2017-12-22 DIAGNOSIS — N186 End stage renal disease: Secondary | ICD-10-CM | POA: Diagnosis not present

## 2017-12-22 DIAGNOSIS — E1122 Type 2 diabetes mellitus with diabetic chronic kidney disease: Secondary | ICD-10-CM | POA: Diagnosis not present

## 2017-12-22 DIAGNOSIS — D631 Anemia in chronic kidney disease: Secondary | ICD-10-CM | POA: Diagnosis not present

## 2017-12-22 DIAGNOSIS — N2581 Secondary hyperparathyroidism of renal origin: Secondary | ICD-10-CM | POA: Diagnosis not present

## 2017-12-25 DIAGNOSIS — N186 End stage renal disease: Secondary | ICD-10-CM | POA: Diagnosis not present

## 2017-12-25 DIAGNOSIS — E1122 Type 2 diabetes mellitus with diabetic chronic kidney disease: Secondary | ICD-10-CM | POA: Diagnosis not present

## 2017-12-25 DIAGNOSIS — D631 Anemia in chronic kidney disease: Secondary | ICD-10-CM | POA: Diagnosis not present

## 2017-12-25 DIAGNOSIS — N2581 Secondary hyperparathyroidism of renal origin: Secondary | ICD-10-CM | POA: Diagnosis not present

## 2017-12-26 ENCOUNTER — Other Ambulatory Visit: Payer: Self-pay | Admitting: Internal Medicine

## 2017-12-26 DIAGNOSIS — Z1231 Encounter for screening mammogram for malignant neoplasm of breast: Secondary | ICD-10-CM

## 2017-12-27 DIAGNOSIS — E1122 Type 2 diabetes mellitus with diabetic chronic kidney disease: Secondary | ICD-10-CM | POA: Diagnosis not present

## 2017-12-27 DIAGNOSIS — N186 End stage renal disease: Secondary | ICD-10-CM | POA: Diagnosis not present

## 2017-12-27 DIAGNOSIS — N2581 Secondary hyperparathyroidism of renal origin: Secondary | ICD-10-CM | POA: Diagnosis not present

## 2017-12-27 DIAGNOSIS — D631 Anemia in chronic kidney disease: Secondary | ICD-10-CM | POA: Diagnosis not present

## 2017-12-29 DIAGNOSIS — D631 Anemia in chronic kidney disease: Secondary | ICD-10-CM | POA: Diagnosis not present

## 2017-12-29 DIAGNOSIS — N2581 Secondary hyperparathyroidism of renal origin: Secondary | ICD-10-CM | POA: Diagnosis not present

## 2017-12-29 DIAGNOSIS — N186 End stage renal disease: Secondary | ICD-10-CM | POA: Diagnosis not present

## 2017-12-29 DIAGNOSIS — E1122 Type 2 diabetes mellitus with diabetic chronic kidney disease: Secondary | ICD-10-CM | POA: Diagnosis not present

## 2017-12-30 DIAGNOSIS — Z992 Dependence on renal dialysis: Secondary | ICD-10-CM | POA: Diagnosis not present

## 2017-12-30 DIAGNOSIS — E1022 Type 1 diabetes mellitus with diabetic chronic kidney disease: Secondary | ICD-10-CM | POA: Diagnosis not present

## 2017-12-30 DIAGNOSIS — N186 End stage renal disease: Secondary | ICD-10-CM | POA: Diagnosis not present

## 2017-12-30 HISTORY — PX: NEPHRECTOMY TRANSPLANTED ORGAN: SUR880

## 2018-01-01 DIAGNOSIS — N2581 Secondary hyperparathyroidism of renal origin: Secondary | ICD-10-CM | POA: Diagnosis not present

## 2018-01-01 DIAGNOSIS — N186 End stage renal disease: Secondary | ICD-10-CM | POA: Diagnosis not present

## 2018-01-02 DIAGNOSIS — M1990 Unspecified osteoarthritis, unspecified site: Secondary | ICD-10-CM | POA: Diagnosis present

## 2018-01-02 DIAGNOSIS — Z7682 Awaiting organ transplant status: Secondary | ICD-10-CM | POA: Diagnosis not present

## 2018-01-02 DIAGNOSIS — E039 Hypothyroidism, unspecified: Secondary | ICD-10-CM | POA: Diagnosis not present

## 2018-01-02 DIAGNOSIS — H548 Legal blindness, as defined in USA: Secondary | ICD-10-CM | POA: Diagnosis present

## 2018-01-02 DIAGNOSIS — E1142 Type 2 diabetes mellitus with diabetic polyneuropathy: Secondary | ICD-10-CM | POA: Diagnosis present

## 2018-01-02 DIAGNOSIS — D899 Disorder involving the immune mechanism, unspecified: Secondary | ICD-10-CM | POA: Diagnosis not present

## 2018-01-02 DIAGNOSIS — I10 Essential (primary) hypertension: Secondary | ICD-10-CM | POA: Diagnosis not present

## 2018-01-02 DIAGNOSIS — E782 Mixed hyperlipidemia: Secondary | ICD-10-CM | POA: Diagnosis not present

## 2018-01-02 DIAGNOSIS — Z7982 Long term (current) use of aspirin: Secondary | ICD-10-CM | POA: Diagnosis not present

## 2018-01-02 DIAGNOSIS — Z940 Kidney transplant status: Secondary | ICD-10-CM | POA: Diagnosis not present

## 2018-01-02 DIAGNOSIS — Z992 Dependence on renal dialysis: Secondary | ICD-10-CM | POA: Diagnosis not present

## 2018-01-02 DIAGNOSIS — Z01818 Encounter for other preprocedural examination: Secondary | ICD-10-CM | POA: Diagnosis not present

## 2018-01-02 DIAGNOSIS — Z792 Long term (current) use of antibiotics: Secondary | ICD-10-CM | POA: Insufficient documentation

## 2018-01-02 DIAGNOSIS — N1330 Unspecified hydronephrosis: Secondary | ICD-10-CM | POA: Diagnosis not present

## 2018-01-02 DIAGNOSIS — B1710 Acute hepatitis C without hepatic coma: Secondary | ICD-10-CM | POA: Diagnosis not present

## 2018-01-02 DIAGNOSIS — Z90710 Acquired absence of both cervix and uterus: Secondary | ICD-10-CM | POA: Diagnosis not present

## 2018-01-02 DIAGNOSIS — E1129 Type 2 diabetes mellitus with other diabetic kidney complication: Secondary | ICD-10-CM | POA: Diagnosis not present

## 2018-01-02 DIAGNOSIS — N185 Chronic kidney disease, stage 5: Secondary | ICD-10-CM | POA: Diagnosis not present

## 2018-01-02 DIAGNOSIS — E1122 Type 2 diabetes mellitus with diabetic chronic kidney disease: Secondary | ICD-10-CM | POA: Diagnosis not present

## 2018-01-02 DIAGNOSIS — N2581 Secondary hyperparathyroidism of renal origin: Secondary | ICD-10-CM | POA: Diagnosis present

## 2018-01-02 DIAGNOSIS — T380X5A Adverse effect of glucocorticoids and synthetic analogues, initial encounter: Secondary | ICD-10-CM | POA: Diagnosis not present

## 2018-01-02 DIAGNOSIS — Z205 Contact with and (suspected) exposure to viral hepatitis: Secondary | ICD-10-CM | POA: Diagnosis not present

## 2018-01-02 DIAGNOSIS — E113593 Type 2 diabetes mellitus with proliferative diabetic retinopathy without macular edema, bilateral: Secondary | ICD-10-CM | POA: Diagnosis not present

## 2018-01-02 DIAGNOSIS — N186 End stage renal disease: Secondary | ICD-10-CM | POA: Diagnosis not present

## 2018-01-02 DIAGNOSIS — D849 Immunodeficiency, unspecified: Secondary | ICD-10-CM | POA: Insufficient documentation

## 2018-01-02 DIAGNOSIS — I1311 Hypertensive heart and chronic kidney disease without heart failure, with stage 5 chronic kidney disease, or end stage renal disease: Secondary | ICD-10-CM | POA: Diagnosis present

## 2018-01-02 DIAGNOSIS — T8619 Other complication of kidney transplant: Secondary | ICD-10-CM | POA: Diagnosis not present

## 2018-01-02 DIAGNOSIS — R739 Hyperglycemia, unspecified: Secondary | ICD-10-CM | POA: Diagnosis not present

## 2018-01-02 DIAGNOSIS — E1165 Type 2 diabetes mellitus with hyperglycemia: Secondary | ICD-10-CM | POA: Diagnosis not present

## 2018-01-02 DIAGNOSIS — E113599 Type 2 diabetes mellitus with proliferative diabetic retinopathy without macular edema, unspecified eye: Secondary | ICD-10-CM | POA: Diagnosis present

## 2018-01-04 ENCOUNTER — Ambulatory Visit: Payer: Medicare Other

## 2018-01-04 ENCOUNTER — Ambulatory Visit: Payer: Self-pay | Admitting: Physician Assistant

## 2018-01-06 MED ORDER — ONDANSETRON HCL 4 MG/2ML IJ SOLN
4.00 | INTRAMUSCULAR | Status: DC
Start: ? — End: 2018-01-06

## 2018-01-06 MED ORDER — VALGANCICLOVIR HCL 450 MG PO TABS
450.00 | ORAL_TABLET | ORAL | Status: DC
Start: ? — End: 2018-01-06

## 2018-01-06 MED ORDER — GENERIC EXTERNAL MEDICATION
10.00 | Status: DC
Start: ? — End: 2018-01-06

## 2018-01-06 MED ORDER — SIROLIMUS 1 MG PO TABS
2.00 | ORAL_TABLET | ORAL | Status: DC
Start: 2018-01-07 — End: 2018-01-06

## 2018-01-06 MED ORDER — INSULIN LISPRO 100 UNIT/ML ~~LOC~~ SOLN
.00 | SUBCUTANEOUS | Status: DC
Start: 2018-01-06 — End: 2018-01-06

## 2018-01-06 MED ORDER — GENERIC EXTERNAL MEDICATION
1.00 | Status: DC
Start: ? — End: 2018-01-06

## 2018-01-06 MED ORDER — DEXTROSE 50 % IV SOLN
12.50 | INTRAVENOUS | Status: DC
Start: ? — End: 2018-01-06

## 2018-01-06 MED ORDER — INSULIN LISPRO 100 UNIT/ML ~~LOC~~ SOLN
4.00 | SUBCUTANEOUS | Status: DC
Start: 2018-01-07 — End: 2018-01-06

## 2018-01-06 MED ORDER — LEVOTHYROXINE SODIUM 150 MCG PO TABS
150.00 | ORAL_TABLET | ORAL | Status: DC
Start: 2018-01-07 — End: 2018-01-06

## 2018-01-06 MED ORDER — ASPIRIN 81 MG PO CHEW
81.00 | CHEWABLE_TABLET | ORAL | Status: DC
Start: 2018-01-07 — End: 2018-01-06

## 2018-01-06 MED ORDER — OXYCODONE-ACETAMINOPHEN 5-325 MG PO TABS
1.00 | ORAL_TABLET | ORAL | Status: DC
Start: ? — End: 2018-01-06

## 2018-01-06 MED ORDER — INSULIN LISPRO 100 UNIT/ML ~~LOC~~ SOLN
5.00 | SUBCUTANEOUS | Status: DC
Start: 2018-01-06 — End: 2018-01-06

## 2018-01-06 MED ORDER — ACETAMINOPHEN 325 MG PO TABS
650.00 | ORAL_TABLET | ORAL | Status: DC
Start: ? — End: 2018-01-06

## 2018-01-06 MED ORDER — SULFAMETHOXAZOLE-TRIMETHOPRIM 800-160 MG PO TABS
1.00 | ORAL_TABLET | ORAL | Status: DC
Start: 2018-01-07 — End: 2018-01-06

## 2018-01-06 MED ORDER — POLYETHYLENE GLYCOL 3350 17 G PO PACK
17.00 | PACK | ORAL | Status: DC
Start: 2018-01-07 — End: 2018-01-06

## 2018-01-06 MED ORDER — SENNOSIDES-DOCUSATE SODIUM 8.6-50 MG PO TABS
2.00 | ORAL_TABLET | ORAL | Status: DC
Start: 2018-01-06 — End: 2018-01-06

## 2018-01-06 MED ORDER — GENERIC EXTERNAL MEDICATION
5.00 | Status: DC
Start: ? — End: 2018-01-06

## 2018-01-09 DIAGNOSIS — Z940 Kidney transplant status: Secondary | ICD-10-CM | POA: Diagnosis not present

## 2018-01-09 DIAGNOSIS — E113593 Type 2 diabetes mellitus with proliferative diabetic retinopathy without macular edema, bilateral: Secondary | ICD-10-CM | POA: Diagnosis not present

## 2018-01-09 DIAGNOSIS — N185 Chronic kidney disease, stage 5: Secondary | ICD-10-CM | POA: Diagnosis not present

## 2018-01-09 DIAGNOSIS — E039 Hypothyroidism, unspecified: Secondary | ICD-10-CM | POA: Diagnosis not present

## 2018-01-09 DIAGNOSIS — I10 Essential (primary) hypertension: Secondary | ICD-10-CM | POA: Diagnosis not present

## 2018-01-09 DIAGNOSIS — N2581 Secondary hyperparathyroidism of renal origin: Secondary | ICD-10-CM | POA: Diagnosis not present

## 2018-01-09 DIAGNOSIS — Z792 Long term (current) use of antibiotics: Secondary | ICD-10-CM | POA: Diagnosis not present

## 2018-01-09 DIAGNOSIS — T8619 Other complication of kidney transplant: Secondary | ICD-10-CM | POA: Diagnosis not present

## 2018-01-09 DIAGNOSIS — E785 Hyperlipidemia, unspecified: Secondary | ICD-10-CM | POA: Diagnosis not present

## 2018-01-09 DIAGNOSIS — Z4822 Encounter for aftercare following kidney transplant: Secondary | ICD-10-CM | POA: Diagnosis not present

## 2018-01-09 DIAGNOSIS — E119 Type 2 diabetes mellitus without complications: Secondary | ICD-10-CM | POA: Diagnosis not present

## 2018-01-09 DIAGNOSIS — D899 Disorder involving the immune mechanism, unspecified: Secondary | ICD-10-CM | POA: Diagnosis not present

## 2018-01-09 DIAGNOSIS — E1122 Type 2 diabetes mellitus with diabetic chronic kidney disease: Secondary | ICD-10-CM | POA: Diagnosis not present

## 2018-01-10 NOTE — Progress Notes (Signed)
MEDICARE ANNUAL WELLNESS VISIT AND FOLLOW UP  Assessment:   HAS BEEN HAVING LABS WITH [facility] AND TRANSPLANT WILL NOT GET TODAY  Essential hypertension - continue medications, DASH diet, exercise and monitor at home. Call if greater than 130/80.  Continue coreg, will monitor closley  Hypothyroidism due to Hashimoto's thyroiditis Hypothyroidism-continue medications the same, reminded to take on an empty stomach 30-30mins before food.   Proliferative diabetic retinopathy with macular edema associated with type 2 diabetes mellitus, unspecified laterality (HCC) Discussed general issues about diabetes pathophysiology and management., Educational material distributed., Suggested low cholesterol diet., Encouraged aerobic exercise., Discussed foot care., Reminded to get yearly retinal exam.  End stage renal disease on dialysis due to type 2 diabetes mellitus (HCC) Increase fluids, avoid NSAIDS, monitor sugars, will monitor  Hyperparathyroidism, secondary renal ([place]) CONTINUE FOLLOW UP  Diabetes mellitus with cataract ([place]) Discussed disease progression and risks Discussed diet/exercise, weight management and risk modification  Mixed hyperlipidemia check lipids decrease fatty foods increase activity.   Medication management  Vitamin D deficiency Continue supplement  Body mass index (BMI) 24.0-24.9, adult   Over 40 minutes of exam, counseling, chart review and critical decision making was performed Future Appointments  Date Time Provider [place]  02/01/2018 10:15 AM Gardiner Barefoot, DPM [facility]  04/10/2018 11:30 AM Unk Pinto, MD [facility] None  11/07/2018  3:00 PM Unk Pinto, MD [facility] None     Plan:   During the course of the visit the patient was educated and counseled about appropriate screening and preventive services including:    Pneumococcal vaccine   Prevnar 13  Influenza vaccine  Td vaccine  Screening  electrocardiogram  Bone densitometry screening  Colorectal cancer screening  Diabetes screening  Glaucoma screening  Nutrition counseling   Advanced directives: requested   Subjective:  Denise Bush is a 67 y.o. AA female who presents  3 month follow up.   She recently had renal transplant on 09/04 for ESRD due to DM. Constipation better with senokot every 3rd day.   She continues to be followed closely by nephrology ( followed by Dr. Justin Mend), ophthalmology for proliferative diabetic retinopathy, and podiatry q 4 months, due to previously poorly controlled T2DM. She is tolerating immunosuppression (Campath induction; Belatacept protocol and Sirolimus 2mg  daily) she is on prophylaxis with bactrim and valcyte dialy.   BMI is Body mass index is 24.76 kg/m., weight is stable. No edema. Appetite is good, she is trying to get 64 oz a day of water, urine output is good.  Wt Readings from Last 3 Encounters:  01/15/18 153 lb 6.4 oz (69.6 kg)  10/03/17 149 lb (67.6 kg)  06/20/17 153 lb (69.4 kg)    Her blood pressure has been controlled at home, today their BP is BP: 122/66- controlled on the coreg.   She does not workout. She denies chest pain, shortness of breath, dizziness.   She is on cholesterol medication and denies myalgias. Her cholesterol is not at goal. The cholesterol last visit was:   Lab Results  Component Value Date   CHOL 199 10/03/2017   HDL 40 (L) 10/03/2017   LDLCALC 123 (H) 10/03/2017   TRIG 238 (H) 10/03/2017   CHOLHDL 5.0 (H) 10/03/2017    She has been working on diet and exercise for diabetes, she is on 4 units with meals TID, she has not had to use sliding scale, AM sugar is 110, and denies hyperglycemia, hypoglycemia , nausea, paresthesia of the feet, polydipsia and polyuria. Last A1C in  the office was:  Lab Results  Component Value Date   HGBA1C 7.0 (H) 10/03/2017   Patient is on large dose of Vitamin D supplement but remains below goal:    Lab Results   Component Value Date   VD25OH 27 (L) 10/03/2017      Medication Review: Current Outpatient Medications on File Prior to Visit  Medication Sig Dispense Refill  . aspirin EC 81 MG tablet Take 81 mg by mouth daily.    . carvedilol (COREG) 6.25 MG tablet Take 6.25 mg by mouth 2 (two) times daily with a meal.    . Cholecalciferol (VITAMIN D3) 50000 UNITS CAPS Take 50,000 Units by mouth. With dialysis    . Cyanocobalamin (VITAMIN B 12 PO) Take 1,000 mcg by mouth daily. With dialysis    . ezetimibe (ZETIA) 10 MG tablet Take 1 tablet (10 mg total) by mouth daily. 90 tablet 3  . glucose blood (PRODIGY NO CODING BLOOD GLUC) test strip 1 each.    . levothyroxine (SYNTHROID, LEVOTHROID) 150 MCG tablet Take 1 tablet (150 mcg total) by mouth daily before breakfast. 90 tablet 3  . OVER THE COUNTER MEDICATION Iron 65 mg 1 daily with dialysis    . rosuvastatin (CRESTOR) 40 MG tablet Take 1/2 to 1 tablet daily or as directed for Cholesterol 30 tablet 5  . sucroferric oxyhydroxide (VELPHORO) 500 MG chewable tablet Chew 500 mg by mouth 3 (three) times daily with meals.    . Sulfamethoxazole-Trimethoprim (BACTRIM PO) Take by mouth.     No current facility-administered medications on file prior to visit.     Allergies  Allergen Reactions  . Hydralazine Shortness Of Breath    Chest pain fatigue  . Azor [Amlodipine-Olmesartan] Other (See Comments)    Sharp chest pain  Sharp chest pain  Chest pain  . Hydrochlorothiazide W-Triamterene   . Labetalol     Per patient, caused foot pain  . Lisinopril Other (See Comments)    Severe radiating foot pain.  . Metformin Itching    Loss of bowel control, general sick feeling  . Metoclopramide Other (See Comments)  . Other Other (See Comments)    "contractions in throat" Diazide pt states cause throat to swell.  . Minoxidil Rash    Choking, neck contractions fatigue  . Spironolactone Other (See Comments) and Rash    Severe radiating pain in feet Severe  radiating pain in feet.  . Statins Rash    Fatigue Cause fatigue    Current Problems (verified) Patient Active Problem List   Diagnosis Date Noted  . Diabetes mellitus with cataract ([place]) 02/17/2017  . Hyperparathyroidism, secondary renal ([place]) 06/25/2015  . Body mass index (BMI) 24.0-24.9, adult 03/22/2015  . End stage renal disease on dialysis due to type 2 diabetes mellitus ([place]) 03/22/2015  . Arthritis, degenerative 09/05/2014  . Mixed hyperlipidemia 06/24/2014  . Vitamin D deficiency 06/24/2014  . Medication management 06/24/2014  . Diabetic proliferative retinopathy ([place]) 05/06/2012  . Hypothyroidism 11/22/2006  . Essential hypertension 11/22/2006  . Allergic rhinitis 11/22/2006   SURGICAL HISTORY She  has a past surgical history that includes Eye surgery; Abdominal hysterectomy; Knee surgery; Colonoscopy; and Breast biopsy (Right, 2015). FAMILY HISTORY Her family history includes Breast cancer in her maternal aunt, maternal aunt, maternal grandmother, and sister; CAD in her unknown relative; Colon polyps in her maternal aunt and maternal uncle. SOCIAL HISTORY She  reports that she has never smoked. She has never used smokeless tobacco. She reports that she does not  drink alcohol.  Review of Systems  Constitutional: Negative for malaise/fatigue and weight loss.  HENT: Negative for hearing loss and tinnitus.   Respiratory: Negative for cough, shortness of breath and wheezing.   Cardiovascular: Negative for chest pain, palpitations, orthopnea, claudication and leg swelling.  Gastrointestinal: Negative for abdominal pain, blood in stool, constipation, diarrhea, heartburn, melena, nausea and vomiting.  Genitourinary: Negative.   Musculoskeletal: Negative for joint pain and myalgias.  Skin: Negative for rash.  Neurological: Negative for dizziness, tingling, sensory change, weakness and headaches.  Endo/Heme/Allergies: Negative for polydipsia.  Psychiatric/Behavioral:  Negative.   All other systems reviewed and are negative.    Objective:     Today's Vitals   01/15/18 1105  BP: 122/66  Pulse: 72  Resp: 16  Temp: 97.7 F (36.5 C)  SpO2: 98%  Weight: 153 lb 6.4 oz (69.6 kg)  Height: 5\' 6"  (2.202 m)   Body mass index is 24.76 kg/m.  General appearance: alert, no distress, WD/WN, female HEENT: normocephalic, sclerae anicteric, TMs pearly, nares patent, no discharge or erythema, pharynx normal, she maintains minimal vision, though can somewhat track movement.  Oral cavity: MMM, no lesions Neck: supple, no lymphadenopathy, no thyromegaly, no masses Heart: RRR, normal S1, S2, no murmurs Lungs: CTA bilaterally, no wheezes, rhonchi, or rales Abdomen: +bs, soft, non tender, non distended, no masses, no hepatomegaly, no splenomegaly Musculoskeletal: nontender, no swelling, no obvious deformity Extremities: no edema, no cyanosis, no clubbing Pulses:  symmetric, upper 2+ and lower extremities 1+, normal cap refill Neurological: alert, oriented x 3, CN2-12 intact, strength normal upper extremities and lower extremities, sensation normal throughout, DTRs 2+ throughout, no cerebellar signs, gait slow but normal with cane Psychiatric: normal affect, behavior normal, pleasant     Vicie Mutters, PA-C   01/15/2018

## 2018-01-15 ENCOUNTER — Ambulatory Visit (INDEPENDENT_AMBULATORY_CARE_PROVIDER_SITE_OTHER): Payer: Medicare Other | Admitting: Physician Assistant

## 2018-01-15 ENCOUNTER — Encounter: Payer: Self-pay | Admitting: Physician Assistant

## 2018-01-15 VITALS — BP 122/66 | HR 72 | Temp 97.7°F | Resp 16 | Ht 66.0 in | Wt 153.4 lb

## 2018-01-15 DIAGNOSIS — N186 End stage renal disease: Secondary | ICD-10-CM

## 2018-01-15 DIAGNOSIS — E113519 Type 2 diabetes mellitus with proliferative diabetic retinopathy with macular edema, unspecified eye: Secondary | ICD-10-CM

## 2018-01-15 DIAGNOSIS — E063 Autoimmune thyroiditis: Secondary | ICD-10-CM

## 2018-01-15 DIAGNOSIS — E038 Other specified hypothyroidism: Secondary | ICD-10-CM

## 2018-01-15 DIAGNOSIS — Z992 Dependence on renal dialysis: Secondary | ICD-10-CM

## 2018-01-15 DIAGNOSIS — E559 Vitamin D deficiency, unspecified: Secondary | ICD-10-CM

## 2018-01-15 DIAGNOSIS — E1122 Type 2 diabetes mellitus with diabetic chronic kidney disease: Secondary | ICD-10-CM

## 2018-01-15 DIAGNOSIS — N2581 Secondary hyperparathyroidism of renal origin: Secondary | ICD-10-CM | POA: Diagnosis not present

## 2018-01-15 DIAGNOSIS — Z0001 Encounter for general adult medical examination with abnormal findings: Secondary | ICD-10-CM | POA: Diagnosis not present

## 2018-01-15 DIAGNOSIS — E1136 Type 2 diabetes mellitus with diabetic cataract: Secondary | ICD-10-CM

## 2018-01-15 DIAGNOSIS — E782 Mixed hyperlipidemia: Secondary | ICD-10-CM | POA: Diagnosis not present

## 2018-01-15 DIAGNOSIS — Z6824 Body mass index (BMI) 24.0-24.9, adult: Secondary | ICD-10-CM | POA: Diagnosis not present

## 2018-01-15 DIAGNOSIS — I10 Essential (primary) hypertension: Secondary | ICD-10-CM

## 2018-01-15 DIAGNOSIS — Z79899 Other long term (current) drug therapy: Secondary | ICD-10-CM

## 2018-01-15 DIAGNOSIS — R6889 Other general symptoms and signs: Secondary | ICD-10-CM | POA: Diagnosis not present

## 2018-01-15 NOTE — Patient Instructions (Addendum)
Start on the miralax daily and can do senokot as needed Make sure you are increasing your water, AT LEAST 64oz.      Diabetes and Foot Care Diabetes may cause you to have problems because of poor blood supply (circulation) to your feet and legs. This may cause the skin on your feet to become thinner, break easier, and heal more slowly. Your skin may become dry, and the skin may peel and crack. You may also have nerve damage in your legs and feet causing decreased feeling in them. You may not notice minor injuries to your feet that could lead to infections or more serious problems. Taking care of your feet is one of the most important things you can do for yourself. Follow these instructions at home:  Wear shoes at all times, even in the house. Do not go barefoot. Bare feet are easily injured.  Check your feet daily for blisters, cuts, and redness. If you cannot see the bottom of your feet, use a mirror or ask someone for help.  Wash your feet with warm water (do not use hot water) and mild soap. Then pat your feet and the areas between your toes until they are completely dry. Do not soak your feet as this can dry your skin.  Apply a moisturizing lotion or petroleum jelly (that does not contain alcohol and is unscented) to the skin on your feet and to dry, brittle toenails. Do not apply lotion between your toes.  Trim your toenails straight across. Do not dig under them or around the cuticle. File the edges of your nails with an emery board or nail file.  Do not cut corns or calluses or try to remove them with medicine.  Wear clean socks or stockings every day. Make sure they are not too tight. Do not wear knee-high stockings since they may decrease blood flow to your legs.  Wear shoes that fit properly and have enough cushioning. To break in new shoes, wear them for just a few hours a day. This prevents you from injuring your feet. Always look in your shoes before you put them on to be sure  there are no objects inside.  Do not cross your legs. This may decrease the blood flow to your feet.  If you find a minor scrape, cut, or break in the skin on your feet, keep it and the skin around it clean and dry. These areas may be cleansed with mild soap and water. Do not cleanse the area with peroxide, alcohol, or iodine.  When you remove an adhesive bandage, be sure not to damage the skin around it.  If you have a wound, look at it several times a day to make sure it is healing.  Do not use heating pads or hot water bottles. They may burn your skin. If you have lost feeling in your feet or legs, you may not know it is happening until it is too late.  Make sure your health care provider performs a complete foot exam at least annually or more often if you have foot problems. Report any cuts, sores, or bruises to your health care provider immediately. Contact a health care provider if:  You have an injury that is not healing.  You have cuts or breaks in the skin.  You have an ingrown nail.  You notice redness on your legs or feet.  You feel burning or tingling in your legs or feet.  You have pain or cramps in  your legs and feet.  Your legs or feet are numb.  Your feet always feel cold. Get help right away if:  There is increasing redness, swelling, or pain in or around a wound.  There is a red line that goes up your leg.  Pus is coming from a wound.  You develop a fever or as directed by your health care provider.  You notice a bad smell coming from an ulcer or wound. This information is not intended to replace advice given to you by your health care provider. Make sure you discuss any questions you have with your health care provider. Document Released: 04/14/2000 Document Revised: 09/23/2015 Document Reviewed: 09/24/2012 Elsevier Interactive Patient Education  2017 [employer].  About Constipation  Constipation Overview Constipation is the most common  gastrointestinal complaint - about 4 million Americans experience constipation and make 2.5 million physician visits a year to get help for the problem.  Constipation can occur when the colon absorbs too much water, the colon's muscle contraction is slow or sluggish, and/or there is delayed transit time through the colon.  The result is stool that is hard and dry.  Indicators of constipation include straining during bowel movements greater than 25% of the time, having fewer than three bowel movements per week, and/or the feeling of incomplete evacuation.  There are established guidelines ([place] II ) for defining constipation. A person needs to have two or more of the following symptoms for at least 12 weeks (not necessarily consecutive) in the preceding 12 months: . Straining in  greater than 25% of bowel movements . Lumpy or hard stools in greater than 25% of bowel movements . Sensation of incomplete emptying in greater than 25% of bowel movements . Sensation of anorectal obstruction/blockade in greater than 25% of bowel movements . Manual maneuvers to help empty greater than 25% of bowel movements (e.g., digital evacuation, support of the pelvic floor)  . Less than  3 bowel movements/week . Loose stools are not present, and criteria for irritable bowel syndrome are insufficient  Common Causes of Constipation . Lack of fiber in your diet . Lack of physical activity . Medications, including iron and calcium supplements  . Dairy intake . Dehydration . Abuse of laxatives  Travel  Irritable Bowel Syndrome  Pregnancy  Luteal phase of menstruation (after ovulation and before menses)  Colorectal problems  Intestinal Dysfunction  Treating Constipation  There are several ways of treating constipation, including changes to diet and exercise, use of laxatives, adjustments to the pelvic floor, and scheduled toileting.  These treatments include: . increasing fiber and fluids in the diet   . increasing physical activity . learning muscle coordination   learning proper toileting techniques and toileting modifications   designing and sticking  to a toileting schedule     2007, [employer] Doc.22

## 2018-01-16 DIAGNOSIS — Z792 Long term (current) use of antibiotics: Secondary | ICD-10-CM | POA: Diagnosis not present

## 2018-01-16 DIAGNOSIS — Z8249 Family history of ischemic heart disease and other diseases of the circulatory system: Secondary | ICD-10-CM | POA: Diagnosis not present

## 2018-01-16 DIAGNOSIS — E1122 Type 2 diabetes mellitus with diabetic chronic kidney disease: Secondary | ICD-10-CM | POA: Diagnosis not present

## 2018-01-16 DIAGNOSIS — Z940 Kidney transplant status: Secondary | ICD-10-CM | POA: Diagnosis not present

## 2018-01-16 DIAGNOSIS — Z4822 Encounter for aftercare following kidney transplant: Secondary | ICD-10-CM | POA: Diagnosis not present

## 2018-01-16 DIAGNOSIS — I10 Essential (primary) hypertension: Secondary | ICD-10-CM | POA: Diagnosis not present

## 2018-01-16 DIAGNOSIS — Z794 Long term (current) use of insulin: Secondary | ICD-10-CM | POA: Diagnosis not present

## 2018-01-16 DIAGNOSIS — B1920 Unspecified viral hepatitis C without hepatic coma: Secondary | ICD-10-CM | POA: Diagnosis not present

## 2018-01-16 DIAGNOSIS — E113593 Type 2 diabetes mellitus with proliferative diabetic retinopathy without macular edema, bilateral: Secondary | ICD-10-CM | POA: Diagnosis not present

## 2018-01-16 DIAGNOSIS — E113519 Type 2 diabetes mellitus with proliferative diabetic retinopathy with macular edema, unspecified eye: Secondary | ICD-10-CM | POA: Diagnosis not present

## 2018-01-16 DIAGNOSIS — Z79899 Other long term (current) drug therapy: Secondary | ICD-10-CM | POA: Diagnosis not present

## 2018-01-16 DIAGNOSIS — N2581 Secondary hyperparathyroidism of renal origin: Secondary | ICD-10-CM | POA: Diagnosis not present

## 2018-01-16 DIAGNOSIS — N185 Chronic kidney disease, stage 5: Secondary | ICD-10-CM | POA: Diagnosis not present

## 2018-01-16 DIAGNOSIS — R1032 Left lower quadrant pain: Secondary | ICD-10-CM | POA: Diagnosis not present

## 2018-01-16 DIAGNOSIS — D899 Disorder involving the immune mechanism, unspecified: Secondary | ICD-10-CM | POA: Diagnosis not present

## 2018-01-16 DIAGNOSIS — H548 Legal blindness, as defined in USA: Secondary | ICD-10-CM | POA: Diagnosis not present

## 2018-01-16 DIAGNOSIS — T8619 Other complication of kidney transplant: Secondary | ICD-10-CM | POA: Diagnosis not present

## 2018-01-16 DIAGNOSIS — E039 Hypothyroidism, unspecified: Secondary | ICD-10-CM | POA: Diagnosis not present

## 2018-01-16 DIAGNOSIS — E782 Mixed hyperlipidemia: Secondary | ICD-10-CM | POA: Diagnosis not present

## 2018-01-21 ENCOUNTER — Ambulatory Visit: Payer: Self-pay | Admitting: Physician Assistant

## 2018-01-21 DIAGNOSIS — Z940 Kidney transplant status: Secondary | ICD-10-CM | POA: Diagnosis not present

## 2018-01-21 DIAGNOSIS — T888XXA Other specified complications of surgical and medical care, not elsewhere classified, initial encounter: Secondary | ICD-10-CM | POA: Diagnosis not present

## 2018-01-21 DIAGNOSIS — R1909 Other intra-abdominal and pelvic swelling, mass and lump: Secondary | ICD-10-CM | POA: Diagnosis not present

## 2018-01-21 DIAGNOSIS — R1032 Left lower quadrant pain: Secondary | ICD-10-CM | POA: Diagnosis not present

## 2018-01-30 DIAGNOSIS — H548 Legal blindness, as defined in USA: Secondary | ICD-10-CM | POA: Diagnosis not present

## 2018-01-30 DIAGNOSIS — R300 Dysuria: Secondary | ICD-10-CM | POA: Diagnosis not present

## 2018-01-30 DIAGNOSIS — R634 Abnormal weight loss: Secondary | ICD-10-CM | POA: Diagnosis not present

## 2018-01-30 DIAGNOSIS — I10 Essential (primary) hypertension: Secondary | ICD-10-CM | POA: Diagnosis not present

## 2018-01-30 DIAGNOSIS — B1920 Unspecified viral hepatitis C without hepatic coma: Secondary | ICD-10-CM | POA: Diagnosis not present

## 2018-01-30 DIAGNOSIS — R638 Other symptoms and signs concerning food and fluid intake: Secondary | ICD-10-CM | POA: Diagnosis not present

## 2018-01-30 DIAGNOSIS — Z940 Kidney transplant status: Secondary | ICD-10-CM | POA: Diagnosis not present

## 2018-01-30 DIAGNOSIS — Z4822 Encounter for aftercare following kidney transplant: Secondary | ICD-10-CM | POA: Diagnosis not present

## 2018-01-30 DIAGNOSIS — T8619 Other complication of kidney transplant: Secondary | ICD-10-CM | POA: Diagnosis not present

## 2018-01-30 DIAGNOSIS — E119 Type 2 diabetes mellitus without complications: Secondary | ICD-10-CM | POA: Diagnosis not present

## 2018-01-30 DIAGNOSIS — Z79899 Other long term (current) drug therapy: Secondary | ICD-10-CM | POA: Diagnosis not present

## 2018-01-30 DIAGNOSIS — Z792 Long term (current) use of antibiotics: Secondary | ICD-10-CM | POA: Diagnosis not present

## 2018-02-01 ENCOUNTER — Ambulatory Visit (INDEPENDENT_AMBULATORY_CARE_PROVIDER_SITE_OTHER): Payer: Medicare Other | Admitting: Podiatry

## 2018-02-01 ENCOUNTER — Encounter: Payer: Self-pay | Admitting: Podiatry

## 2018-02-01 DIAGNOSIS — E1040 Type 1 diabetes mellitus with diabetic neuropathy, unspecified: Secondary | ICD-10-CM | POA: Diagnosis not present

## 2018-02-01 DIAGNOSIS — B351 Tinea unguium: Secondary | ICD-10-CM | POA: Diagnosis not present

## 2018-02-01 NOTE — Progress Notes (Signed)
Patient ID: Denise Bush, female   DOB: 04/01/1951, 67 y.o.   MRN: [id number] Complaint:  Visit Type: Patient returns to my office for continued preventative foot care services. Complaint: Patient states" my nails have grown long and thick and become painful to walk and wear shoes" Patient has been diagnosed with DM with no foot complications. The patient presents for preventative foot care services. No changes to ROS.  She has received a new kidney. Podiatric Exam: Vascular: dorsalis pedis and posterior tibial pulses are palpable bilateral. Capillary return is immediate. Temperature gradient is WNL. Skin turgor WNL  Sensorium: Diminished  Semmes Weinstein monofilament test. Normal tactile sensation bilaterally. Nail Exam: Pt has thick disfigured discolored nails with subungual debris noted bilateral entire nail hallux through fifth toenails Ulcer Exam: There is no evidence of ulcer or pre-ulcerative changes or infection. Orthopedic Exam: Muscle tone and strength are WNL. No limitations in general ROM. No crepitus or effusions noted. Foot type and digits show no abnormalities. Bony prominences are unremarkable. Skin:  Asymptomatic  Porokeratosis sub 5th met left foot. No infection or ulcers  Diagnosis:  Onychomycosis, , Pain in right toe, pain in left toes, .  Treatment & Plan Procedures and Treatment: Consent by patient was obtained for treatment procedures. The patient understood the discussion of treatment and procedures well. All questions were answered thoroughly reviewed. Debridement of mycotic and hypertrophic toenails, 1 through 5 bilateral and clearing of subungual debris. No ulceration, no infection noted.   ABN signed for 2019.Marland Kitchen Return Visit-Office Procedure: Patient instructed to return to the office for a follow up visit 4 months for continued evaluation and treatment.Gardiner Barefoot DPM

## 2018-02-08 DIAGNOSIS — H548 Legal blindness, as defined in USA: Secondary | ICD-10-CM | POA: Diagnosis not present

## 2018-02-08 DIAGNOSIS — Z4822 Encounter for aftercare following kidney transplant: Secondary | ICD-10-CM | POA: Diagnosis not present

## 2018-02-08 DIAGNOSIS — D72819 Decreased white blood cell count, unspecified: Secondary | ICD-10-CM | POA: Diagnosis not present

## 2018-02-08 DIAGNOSIS — Z940 Kidney transplant status: Secondary | ICD-10-CM | POA: Diagnosis not present

## 2018-02-08 DIAGNOSIS — T8619 Other complication of kidney transplant: Secondary | ICD-10-CM | POA: Diagnosis not present

## 2018-02-08 DIAGNOSIS — B1920 Unspecified viral hepatitis C without hepatic coma: Secondary | ICD-10-CM | POA: Diagnosis not present

## 2018-02-08 DIAGNOSIS — D899 Disorder involving the immune mechanism, unspecified: Secondary | ICD-10-CM | POA: Diagnosis not present

## 2018-02-08 DIAGNOSIS — E119 Type 2 diabetes mellitus without complications: Secondary | ICD-10-CM | POA: Diagnosis not present

## 2018-02-08 DIAGNOSIS — N185 Chronic kidney disease, stage 5: Secondary | ICD-10-CM | POA: Diagnosis not present

## 2018-02-08 DIAGNOSIS — E782 Mixed hyperlipidemia: Secondary | ICD-10-CM | POA: Diagnosis not present

## 2018-02-08 DIAGNOSIS — B182 Chronic viral hepatitis C: Secondary | ICD-10-CM | POA: Diagnosis not present

## 2018-02-08 DIAGNOSIS — I10 Essential (primary) hypertension: Secondary | ICD-10-CM | POA: Diagnosis not present

## 2018-02-08 DIAGNOSIS — L02211 Cutaneous abscess of abdominal wall: Secondary | ICD-10-CM | POA: Diagnosis not present

## 2018-02-08 DIAGNOSIS — E1122 Type 2 diabetes mellitus with diabetic chronic kidney disease: Secondary | ICD-10-CM | POA: Diagnosis not present

## 2018-02-08 DIAGNOSIS — Z792 Long term (current) use of antibiotics: Secondary | ICD-10-CM | POA: Diagnosis not present

## 2018-02-08 DIAGNOSIS — I898 Other specified noninfective disorders of lymphatic vessels and lymph nodes: Secondary | ICD-10-CM | POA: Diagnosis not present

## 2018-02-08 DIAGNOSIS — B998 Other infectious disease: Secondary | ICD-10-CM | POA: Diagnosis not present

## 2018-02-12 DIAGNOSIS — T8619 Other complication of kidney transplant: Secondary | ICD-10-CM | POA: Diagnosis not present

## 2018-02-12 DIAGNOSIS — N185 Chronic kidney disease, stage 5: Secondary | ICD-10-CM | POA: Diagnosis not present

## 2018-02-12 DIAGNOSIS — T8699 Other complications of unspecified transplanted organ and tissue: Secondary | ICD-10-CM | POA: Diagnosis not present

## 2018-02-12 DIAGNOSIS — I10 Essential (primary) hypertension: Secondary | ICD-10-CM | POA: Diagnosis not present

## 2018-02-12 DIAGNOSIS — R9389 Abnormal findings on diagnostic imaging of other specified body structures: Secondary | ICD-10-CM | POA: Diagnosis not present

## 2018-02-12 DIAGNOSIS — R7989 Other specified abnormal findings of blood chemistry: Secondary | ICD-10-CM | POA: Diagnosis not present

## 2018-02-12 DIAGNOSIS — E119 Type 2 diabetes mellitus without complications: Secondary | ICD-10-CM | POA: Diagnosis not present

## 2018-02-12 DIAGNOSIS — N1330 Unspecified hydronephrosis: Secondary | ICD-10-CM | POA: Diagnosis not present

## 2018-02-12 DIAGNOSIS — R1031 Right lower quadrant pain: Secondary | ICD-10-CM | POA: Diagnosis not present

## 2018-02-12 DIAGNOSIS — Z940 Kidney transplant status: Secondary | ICD-10-CM | POA: Diagnosis not present

## 2018-02-12 DIAGNOSIS — D72819 Decreased white blood cell count, unspecified: Secondary | ICD-10-CM | POA: Diagnosis not present

## 2018-02-12 DIAGNOSIS — H548 Legal blindness, as defined in USA: Secondary | ICD-10-CM | POA: Diagnosis not present

## 2018-02-12 DIAGNOSIS — K7290 Hepatic failure, unspecified without coma: Secondary | ICD-10-CM | POA: Diagnosis not present

## 2018-02-12 DIAGNOSIS — Z4822 Encounter for aftercare following kidney transplant: Secondary | ICD-10-CM | POA: Diagnosis not present

## 2018-02-12 DIAGNOSIS — Z794 Long term (current) use of insulin: Secondary | ICD-10-CM | POA: Diagnosis not present

## 2018-02-12 DIAGNOSIS — D899 Disorder involving the immune mechanism, unspecified: Secondary | ICD-10-CM | POA: Diagnosis not present

## 2018-02-12 DIAGNOSIS — I898 Other specified noninfective disorders of lymphatic vessels and lymph nodes: Secondary | ICD-10-CM | POA: Diagnosis not present

## 2018-02-12 DIAGNOSIS — Z79899 Other long term (current) drug therapy: Secondary | ICD-10-CM | POA: Diagnosis not present

## 2018-02-12 DIAGNOSIS — Z792 Long term (current) use of antibiotics: Secondary | ICD-10-CM | POA: Diagnosis not present

## 2018-02-12 DIAGNOSIS — E1122 Type 2 diabetes mellitus with diabetic chronic kidney disease: Secondary | ICD-10-CM | POA: Diagnosis not present

## 2018-02-14 DIAGNOSIS — Z940 Kidney transplant status: Secondary | ICD-10-CM | POA: Diagnosis not present

## 2018-02-14 DIAGNOSIS — I898 Other specified noninfective disorders of lymphatic vessels and lymph nodes: Secondary | ICD-10-CM | POA: Insufficient documentation

## 2018-02-14 DIAGNOSIS — T888XXS Other specified complications of surgical and medical care, not elsewhere classified, sequela: Secondary | ICD-10-CM | POA: Diagnosis not present

## 2018-02-14 DIAGNOSIS — Z20828 Contact with and (suspected) exposure to other viral communicable diseases: Secondary | ICD-10-CM | POA: Diagnosis not present

## 2018-02-14 DIAGNOSIS — R188 Other ascites: Secondary | ICD-10-CM | POA: Diagnosis not present

## 2018-02-14 DIAGNOSIS — T8699 Other complications of unspecified transplanted organ and tissue: Secondary | ICD-10-CM | POA: Insufficient documentation

## 2018-02-14 DIAGNOSIS — R1032 Left lower quadrant pain: Secondary | ICD-10-CM | POA: Diagnosis not present

## 2018-02-14 DIAGNOSIS — R791 Abnormal coagulation profile: Secondary | ICD-10-CM | POA: Diagnosis not present

## 2018-02-18 DIAGNOSIS — Z5181 Encounter for therapeutic drug level monitoring: Secondary | ICD-10-CM | POA: Diagnosis not present

## 2018-02-18 DIAGNOSIS — B349 Viral infection, unspecified: Secondary | ICD-10-CM | POA: Diagnosis not present

## 2018-02-18 DIAGNOSIS — Z940 Kidney transplant status: Secondary | ICD-10-CM | POA: Diagnosis not present

## 2018-02-18 DIAGNOSIS — B259 Cytomegaloviral disease, unspecified: Secondary | ICD-10-CM | POA: Diagnosis not present

## 2018-02-21 DIAGNOSIS — T8619 Other complication of kidney transplant: Secondary | ICD-10-CM | POA: Diagnosis not present

## 2018-02-21 DIAGNOSIS — T888XXD Other specified complications of surgical and medical care, not elsewhere classified, subsequent encounter: Secondary | ICD-10-CM | POA: Diagnosis not present

## 2018-02-21 DIAGNOSIS — Z940 Kidney transplant status: Secondary | ICD-10-CM | POA: Diagnosis not present

## 2018-02-21 DIAGNOSIS — Z4682 Encounter for fitting and adjustment of non-vascular catheter: Secondary | ICD-10-CM | POA: Diagnosis not present

## 2018-02-26 DIAGNOSIS — B259 Cytomegaloviral disease, unspecified: Secondary | ICD-10-CM | POA: Diagnosis not present

## 2018-02-26 DIAGNOSIS — Z940 Kidney transplant status: Secondary | ICD-10-CM | POA: Diagnosis not present

## 2018-02-26 DIAGNOSIS — B349 Viral infection, unspecified: Secondary | ICD-10-CM | POA: Diagnosis not present

## 2018-02-26 DIAGNOSIS — Z5181 Encounter for therapeutic drug level monitoring: Secondary | ICD-10-CM | POA: Diagnosis not present

## 2018-02-27 DIAGNOSIS — Z4822 Encounter for aftercare following kidney transplant: Secondary | ICD-10-CM | POA: Diagnosis not present

## 2018-02-27 DIAGNOSIS — B182 Chronic viral hepatitis C: Secondary | ICD-10-CM | POA: Diagnosis not present

## 2018-02-27 DIAGNOSIS — Z940 Kidney transplant status: Secondary | ICD-10-CM | POA: Diagnosis not present

## 2018-02-27 DIAGNOSIS — I10 Essential (primary) hypertension: Secondary | ICD-10-CM | POA: Diagnosis not present

## 2018-02-27 DIAGNOSIS — Z79899 Other long term (current) drug therapy: Secondary | ICD-10-CM | POA: Diagnosis not present

## 2018-02-27 DIAGNOSIS — E119 Type 2 diabetes mellitus without complications: Secondary | ICD-10-CM | POA: Diagnosis not present

## 2018-02-27 DIAGNOSIS — D631 Anemia in chronic kidney disease: Secondary | ICD-10-CM | POA: Diagnosis not present

## 2018-02-27 DIAGNOSIS — R630 Anorexia: Secondary | ICD-10-CM | POA: Diagnosis not present

## 2018-02-27 DIAGNOSIS — N189 Chronic kidney disease, unspecified: Secondary | ICD-10-CM | POA: Diagnosis not present

## 2018-02-27 DIAGNOSIS — E1129 Type 2 diabetes mellitus with other diabetic kidney complication: Secondary | ICD-10-CM | POA: Diagnosis not present

## 2018-03-01 DIAGNOSIS — Z940 Kidney transplant status: Secondary | ICD-10-CM | POA: Diagnosis not present

## 2018-03-01 DIAGNOSIS — Z5181 Encounter for therapeutic drug level monitoring: Secondary | ICD-10-CM | POA: Diagnosis not present

## 2018-03-06 DIAGNOSIS — Z1159 Encounter for screening for other viral diseases: Secondary | ICD-10-CM | POA: Diagnosis not present

## 2018-03-06 DIAGNOSIS — E1122 Type 2 diabetes mellitus with diabetic chronic kidney disease: Secondary | ICD-10-CM | POA: Diagnosis not present

## 2018-03-06 DIAGNOSIS — E119 Type 2 diabetes mellitus without complications: Secondary | ICD-10-CM | POA: Diagnosis not present

## 2018-03-06 DIAGNOSIS — D72819 Decreased white blood cell count, unspecified: Secondary | ICD-10-CM | POA: Diagnosis not present

## 2018-03-06 DIAGNOSIS — H548 Legal blindness, as defined in USA: Secondary | ICD-10-CM | POA: Diagnosis not present

## 2018-03-06 DIAGNOSIS — Z940 Kidney transplant status: Secondary | ICD-10-CM | POA: Diagnosis not present

## 2018-03-06 DIAGNOSIS — D899 Disorder involving the immune mechanism, unspecified: Secondary | ICD-10-CM | POA: Diagnosis not present

## 2018-03-06 DIAGNOSIS — Z4822 Encounter for aftercare following kidney transplant: Secondary | ICD-10-CM | POA: Diagnosis not present

## 2018-03-06 DIAGNOSIS — I898 Other specified noninfective disorders of lymphatic vessels and lymph nodes: Secondary | ICD-10-CM | POA: Diagnosis not present

## 2018-03-06 DIAGNOSIS — B1920 Unspecified viral hepatitis C without hepatic coma: Secondary | ICD-10-CM | POA: Diagnosis not present

## 2018-03-06 DIAGNOSIS — I10 Essential (primary) hypertension: Secondary | ICD-10-CM | POA: Diagnosis not present

## 2018-03-11 DIAGNOSIS — Z940 Kidney transplant status: Secondary | ICD-10-CM | POA: Diagnosis not present

## 2018-03-11 DIAGNOSIS — Z4822 Encounter for aftercare following kidney transplant: Secondary | ICD-10-CM | POA: Diagnosis not present

## 2018-03-11 DIAGNOSIS — B1920 Unspecified viral hepatitis C without hepatic coma: Secondary | ICD-10-CM | POA: Diagnosis not present

## 2018-03-11 DIAGNOSIS — D899 Disorder involving the immune mechanism, unspecified: Secondary | ICD-10-CM | POA: Diagnosis not present

## 2018-03-13 DIAGNOSIS — H3341 Traction detachment of retina, right eye: Secondary | ICD-10-CM | POA: Diagnosis not present

## 2018-03-13 DIAGNOSIS — E113513 Type 2 diabetes mellitus with proliferative diabetic retinopathy with macular edema, bilateral: Secondary | ICD-10-CM | POA: Diagnosis not present

## 2018-03-19 DIAGNOSIS — Z940 Kidney transplant status: Secondary | ICD-10-CM | POA: Diagnosis not present

## 2018-03-19 DIAGNOSIS — B259 Cytomegaloviral disease, unspecified: Secondary | ICD-10-CM | POA: Diagnosis not present

## 2018-03-19 DIAGNOSIS — Z5181 Encounter for therapeutic drug level monitoring: Secondary | ICD-10-CM | POA: Diagnosis not present

## 2018-03-19 DIAGNOSIS — B349 Viral infection, unspecified: Secondary | ICD-10-CM | POA: Diagnosis not present

## 2018-03-26 DIAGNOSIS — Z5181 Encounter for therapeutic drug level monitoring: Secondary | ICD-10-CM | POA: Diagnosis not present

## 2018-03-26 DIAGNOSIS — Z940 Kidney transplant status: Secondary | ICD-10-CM | POA: Diagnosis not present

## 2018-03-26 DIAGNOSIS — N390 Urinary tract infection, site not specified: Secondary | ICD-10-CM | POA: Diagnosis not present

## 2018-03-27 DIAGNOSIS — N2581 Secondary hyperparathyroidism of renal origin: Secondary | ICD-10-CM | POA: Diagnosis not present

## 2018-03-27 DIAGNOSIS — E039 Hypothyroidism, unspecified: Secondary | ICD-10-CM | POA: Diagnosis not present

## 2018-03-27 DIAGNOSIS — I120 Hypertensive chronic kidney disease with stage 5 chronic kidney disease or end stage renal disease: Secondary | ICD-10-CM | POA: Diagnosis not present

## 2018-03-27 DIAGNOSIS — H53131 Sudden visual loss, right eye: Secondary | ICD-10-CM | POA: Diagnosis not present

## 2018-03-27 DIAGNOSIS — Z792 Long term (current) use of antibiotics: Secondary | ICD-10-CM | POA: Diagnosis not present

## 2018-03-27 DIAGNOSIS — E782 Mixed hyperlipidemia: Secondary | ICD-10-CM | POA: Diagnosis not present

## 2018-03-27 DIAGNOSIS — N185 Chronic kidney disease, stage 5: Secondary | ICD-10-CM | POA: Diagnosis not present

## 2018-03-27 DIAGNOSIS — E1122 Type 2 diabetes mellitus with diabetic chronic kidney disease: Secondary | ICD-10-CM | POA: Diagnosis not present

## 2018-03-27 DIAGNOSIS — E113593 Type 2 diabetes mellitus with proliferative diabetic retinopathy without macular edema, bilateral: Secondary | ICD-10-CM | POA: Diagnosis not present

## 2018-03-27 DIAGNOSIS — Z940 Kidney transplant status: Secondary | ICD-10-CM | POA: Diagnosis not present

## 2018-04-02 DIAGNOSIS — B259 Cytomegaloviral disease, unspecified: Secondary | ICD-10-CM | POA: Diagnosis not present

## 2018-04-02 DIAGNOSIS — Z940 Kidney transplant status: Secondary | ICD-10-CM | POA: Diagnosis not present

## 2018-04-02 DIAGNOSIS — Z5181 Encounter for therapeutic drug level monitoring: Secondary | ICD-10-CM | POA: Diagnosis not present

## 2018-04-02 DIAGNOSIS — B349 Viral infection, unspecified: Secondary | ICD-10-CM | POA: Diagnosis not present

## 2018-04-08 DIAGNOSIS — D72818 Other decreased white blood cell count: Secondary | ICD-10-CM | POA: Diagnosis not present

## 2018-04-08 DIAGNOSIS — B182 Chronic viral hepatitis C: Secondary | ICD-10-CM | POA: Diagnosis not present

## 2018-04-08 DIAGNOSIS — Z940 Kidney transplant status: Secondary | ICD-10-CM | POA: Diagnosis not present

## 2018-04-08 DIAGNOSIS — D899 Disorder involving the immune mechanism, unspecified: Secondary | ICD-10-CM | POA: Diagnosis not present

## 2018-04-08 DIAGNOSIS — T8619 Other complication of kidney transplant: Secondary | ICD-10-CM | POA: Diagnosis not present

## 2018-04-10 ENCOUNTER — Ambulatory Visit: Payer: Self-pay | Admitting: Internal Medicine

## 2018-04-15 DIAGNOSIS — Z940 Kidney transplant status: Secondary | ICD-10-CM | POA: Diagnosis not present

## 2018-04-15 DIAGNOSIS — B259 Cytomegaloviral disease, unspecified: Secondary | ICD-10-CM | POA: Diagnosis not present

## 2018-04-15 DIAGNOSIS — Z5181 Encounter for therapeutic drug level monitoring: Secondary | ICD-10-CM | POA: Diagnosis not present

## 2018-04-15 DIAGNOSIS — B349 Viral infection, unspecified: Secondary | ICD-10-CM | POA: Diagnosis not present

## 2018-04-25 DIAGNOSIS — E119 Type 2 diabetes mellitus without complications: Secondary | ICD-10-CM | POA: Diagnosis not present

## 2018-04-25 DIAGNOSIS — H53131 Sudden visual loss, right eye: Secondary | ICD-10-CM | POA: Diagnosis not present

## 2018-04-25 DIAGNOSIS — E782 Mixed hyperlipidemia: Secondary | ICD-10-CM | POA: Diagnosis not present

## 2018-04-25 DIAGNOSIS — D899 Disorder involving the immune mechanism, unspecified: Secondary | ICD-10-CM | POA: Diagnosis not present

## 2018-04-25 DIAGNOSIS — I10 Essential (primary) hypertension: Secondary | ICD-10-CM | POA: Diagnosis not present

## 2018-04-25 DIAGNOSIS — Z79899 Other long term (current) drug therapy: Secondary | ICD-10-CM | POA: Diagnosis not present

## 2018-04-25 DIAGNOSIS — E113593 Type 2 diabetes mellitus with proliferative diabetic retinopathy without macular edema, bilateral: Secondary | ICD-10-CM | POA: Diagnosis not present

## 2018-04-25 DIAGNOSIS — N2581 Secondary hyperparathyroidism of renal origin: Secondary | ICD-10-CM | POA: Diagnosis not present

## 2018-04-25 DIAGNOSIS — Z792 Long term (current) use of antibiotics: Secondary | ICD-10-CM | POA: Diagnosis not present

## 2018-04-25 DIAGNOSIS — E039 Hypothyroidism, unspecified: Secondary | ICD-10-CM | POA: Diagnosis not present

## 2018-04-25 DIAGNOSIS — Z940 Kidney transplant status: Secondary | ICD-10-CM | POA: Diagnosis not present

## 2018-04-25 DIAGNOSIS — T8619 Other complication of kidney transplant: Secondary | ICD-10-CM | POA: Diagnosis not present

## 2018-05-02 ENCOUNTER — Ambulatory Visit: Payer: Medicare Other

## 2018-05-02 DIAGNOSIS — Z5181 Encounter for therapeutic drug level monitoring: Secondary | ICD-10-CM | POA: Diagnosis not present

## 2018-05-02 DIAGNOSIS — B349 Viral infection, unspecified: Secondary | ICD-10-CM | POA: Diagnosis not present

## 2018-05-02 DIAGNOSIS — B259 Cytomegaloviral disease, unspecified: Secondary | ICD-10-CM | POA: Diagnosis not present

## 2018-05-02 DIAGNOSIS — Z940 Kidney transplant status: Secondary | ICD-10-CM | POA: Diagnosis not present

## 2018-05-08 ENCOUNTER — Ambulatory Visit
Admission: RE | Admit: 2018-05-08 | Discharge: 2018-05-08 | Disposition: A | Discharge status: Home / Self Care | Payer: Medicare Other | Source: Ambulatory Visit | Attending: Internal Medicine | Admitting: Internal Medicine

## 2018-05-08 DIAGNOSIS — E039 Hypothyroidism, unspecified: Secondary | ICD-10-CM | POA: Diagnosis not present

## 2018-05-08 DIAGNOSIS — Z1231 Encounter for screening mammogram for malignant neoplasm of breast: Secondary | ICD-10-CM | POA: Diagnosis not present

## 2018-05-08 DIAGNOSIS — N185 Chronic kidney disease, stage 5: Secondary | ICD-10-CM | POA: Diagnosis not present

## 2018-05-08 DIAGNOSIS — E1122 Type 2 diabetes mellitus with diabetic chronic kidney disease: Secondary | ICD-10-CM | POA: Diagnosis not present

## 2018-05-20 DIAGNOSIS — Z940 Kidney transplant status: Secondary | ICD-10-CM | POA: Diagnosis not present

## 2018-05-20 DIAGNOSIS — B349 Viral infection, unspecified: Secondary | ICD-10-CM | POA: Diagnosis not present

## 2018-05-20 DIAGNOSIS — B259 Cytomegaloviral disease, unspecified: Secondary | ICD-10-CM | POA: Diagnosis not present

## 2018-05-20 DIAGNOSIS — Z5181 Encounter for therapeutic drug level monitoring: Secondary | ICD-10-CM | POA: Diagnosis not present

## 2018-05-23 DIAGNOSIS — H53131 Sudden visual loss, right eye: Secondary | ICD-10-CM | POA: Diagnosis not present

## 2018-05-23 DIAGNOSIS — N2581 Secondary hyperparathyroidism of renal origin: Secondary | ICD-10-CM | POA: Diagnosis not present

## 2018-05-23 DIAGNOSIS — Z792 Long term (current) use of antibiotics: Secondary | ICD-10-CM | POA: Diagnosis not present

## 2018-05-23 DIAGNOSIS — N185 Chronic kidney disease, stage 5: Secondary | ICD-10-CM | POA: Diagnosis not present

## 2018-05-23 DIAGNOSIS — E782 Mixed hyperlipidemia: Secondary | ICD-10-CM | POA: Diagnosis not present

## 2018-05-23 DIAGNOSIS — E113593 Type 2 diabetes mellitus with proliferative diabetic retinopathy without macular edema, bilateral: Secondary | ICD-10-CM | POA: Diagnosis not present

## 2018-05-23 DIAGNOSIS — I120 Hypertensive chronic kidney disease with stage 5 chronic kidney disease or end stage renal disease: Secondary | ICD-10-CM | POA: Diagnosis not present

## 2018-05-23 DIAGNOSIS — Z4822 Encounter for aftercare following kidney transplant: Secondary | ICD-10-CM | POA: Diagnosis not present

## 2018-05-23 DIAGNOSIS — E039 Hypothyroidism, unspecified: Secondary | ICD-10-CM | POA: Diagnosis not present

## 2018-05-23 DIAGNOSIS — E1122 Type 2 diabetes mellitus with diabetic chronic kidney disease: Secondary | ICD-10-CM | POA: Diagnosis not present

## 2018-05-24 DIAGNOSIS — B182 Chronic viral hepatitis C: Secondary | ICD-10-CM | POA: Diagnosis not present

## 2018-05-24 DIAGNOSIS — Z79899 Other long term (current) drug therapy: Secondary | ICD-10-CM | POA: Diagnosis not present

## 2018-05-24 DIAGNOSIS — Z940 Kidney transplant status: Secondary | ICD-10-CM | POA: Diagnosis not present

## 2018-05-24 DIAGNOSIS — B998 Other infectious disease: Secondary | ICD-10-CM | POA: Diagnosis not present

## 2018-05-24 DIAGNOSIS — Z4822 Encounter for aftercare following kidney transplant: Secondary | ICD-10-CM | POA: Diagnosis not present

## 2018-05-24 DIAGNOSIS — D899 Disorder involving the immune mechanism, unspecified: Secondary | ICD-10-CM | POA: Diagnosis not present

## 2018-05-30 DIAGNOSIS — Z940 Kidney transplant status: Secondary | ICD-10-CM | POA: Diagnosis not present

## 2018-05-30 DIAGNOSIS — Z5181 Encounter for therapeutic drug level monitoring: Secondary | ICD-10-CM | POA: Diagnosis not present

## 2018-05-30 DIAGNOSIS — B259 Cytomegaloviral disease, unspecified: Secondary | ICD-10-CM | POA: Diagnosis not present

## 2018-05-30 DIAGNOSIS — B349 Viral infection, unspecified: Secondary | ICD-10-CM | POA: Diagnosis not present

## 2018-06-07 ENCOUNTER — Ambulatory Visit: Payer: Medicare Other | Admitting: Podiatry

## 2018-06-10 ENCOUNTER — Encounter: Payer: Self-pay | Admitting: Adult Health

## 2018-06-10 ENCOUNTER — Ambulatory Visit (INDEPENDENT_AMBULATORY_CARE_PROVIDER_SITE_OTHER): Payer: Medicare Other | Admitting: Adult Health

## 2018-06-10 VITALS — BP 136/58 | HR 76 | Temp 98.1°F | Ht 66.0 in | Wt 148.0 lb

## 2018-06-10 DIAGNOSIS — J00 Acute nasopharyngitis [common cold]: Secondary | ICD-10-CM | POA: Diagnosis not present

## 2018-06-10 MED ORDER — PREDNISONE 20 MG PO TABS: ORAL_TABLET | ORAL | 0 refills | Status: DC

## 2018-06-10 MED ORDER — AZITHROMYCIN 250 MG PO TABS: ORAL_TABLET | ORAL | 1 refills | Status: AC

## 2018-06-10 MED ORDER — PROMETHAZINE-DM 6.25-15 MG/5ML PO SYRP: 5.0000 mL | ORAL_SOLUTION | Freq: Four times a day (QID) | ORAL | 1 refills | Status: DC | PRN

## 2018-06-10 NOTE — Patient Instructions (Addendum)
  Get back on a daily allergy medication for a few weeks - fexofenadine    HOW TO TREAT VIRAL COUGH AND COLD SYMPTOMS:  -Symptoms usually last at least 1 week with the worst symptoms being around day 4.  - colds usually start with a sore throat and end with a cough, and the cough can take 2 weeks to get better.  -No antibiotics are needed for colds, flu, sore throats, cough, bronchitis UNLESS symptoms are longer than 7 days OR if you are getting better then get drastically worse.  -There are a lot of combination medications (Dayquil, Nyquil, Vicks 44, tyelnol cold and sinus, ETC). Please look at the ingredients on the back so that you are treating the correct symptoms and not doubling up on medications/ingredients.    Medicines you can use  Nasal congestion  Little Remedies saline spray (aerosol/mist)- can try this, it is in the kids section - pseudoephedrine (Sudafed)- behind the counter, do not use if you have high blood pressure, medicine that have -D in them.  - phenylephrine (Sudafed PE) -Dextormethorphan + chlorpheniramine (Coridcidin HBP)- okay if you have high blood pressure -Oxymetazoline (Afrin) nasal spray- LIMIT to 3 days -Saline nasal spray -Neti pot (used distilled or bottled water)  Ear pain/congestion  -pseudoephedrine (sudafed) - Nasonex/flonase nasal spray  Fever  -Acetaminophen (Tyelnol) -Ibuprofen (Advil, motrin, aleve)  Sore Throat  -Acetaminophen (Tyelnol) -Ibuprofen (Advil, motrin, aleve) -Drink a lot of water -Gargle with salt water - Rest your voice (don't talk) -Throat sprays -Cough drops  Body Aches  -Acetaminophen (Tyelnol) -Ibuprofen (Advil, motrin, aleve)  Headache  -Acetaminophen (Tyelnol) -Ibuprofen (Advil, motrin, aleve) - Exedrin, Exedrin Migraine  Allergy symptoms (cough, sneeze, runny nose, itchy eyes) -Claritin or loratadine cheapest but likely the weakest  -Zyrtec or certizine at night because it can make you sleepy -The  strongest is allegra or fexafinadine  Cheapest at [employer], [employer], [employer]  Cough  -Dextromethorphan (Delsym)- medicine that has DM in it -Guafenesin (Mucinex/Robitussin) - cough drops - drink lots of water  Chest Congestion  -Guafenesin (Mucinex/Robitussin)  Red Itchy Eyes  - Naphcon-A  Upset Stomach  - Bland diet (nothing spicy, greasy, fried, and high acid foods like tomatoes, oranges, berries) -OKAY- cereal, bread, soup, crackers, rice -Eat smaller more frequent meals -reduce caffeine, no alcohol -Loperamide (Imodium-AD) if diarrhea -Prevacid for heart burn  General health when sick  -Hydration -wash your hands frequently -keep surfaces clean -change pillow cases and sheets often -Get fresh air but do not exercise strenuously -Vitamin D, double up on it - Vitamin C -Zinc

## 2018-06-10 NOTE — Progress Notes (Signed)
Assessment and Plan:  Denise Bush was seen today for acute visit.  Diagnoses and all orders for this visit:  Acute nasopharyngitis (common cold) Discussed the importance of avoiding unnecessary antibiotic therapy. Suggested symptomatic OTC remedies. Nasal saline spray for congestion. Nasal steroids, allergy pill, oral steroids offered Follow up as needed. -     predniSONE (DELTASONE) 20 MG tablet; 2 tablets daily for 3 days, 1 tablet daily for 4 days. -     promethazine-dextromethorphan (PROMETHAZINE-DM) 6.25-15 MG/5ML syrup; Take 5 mLs by mouth 4 (four) times daily as needed for cough.  Other orders -     azithromycin (ZITHROMAX) 250 MG tablet; Take 2 tablets (500 mg) on  Day 1,  followed by 1 tablet (250 mg) once daily on Days 2 through 5.   Further disposition pending results of labs. Discussed med's effects and SE's.   Over 15 minutes of exam, counseling, chart review, and critical decision making was performed.   Future Appointments  Date Time Provider Sun  06/28/2018  9:15 AM Gardiner Barefoot, DPM [facility]  11/07/2018  3:00 PM Unk Pinto, MD [facility] None    ------------------------------------------------------------------------------------------------------------------   HPI BP (!) 136/58   Pulse 76   Temp 98.1 F (36.7 C)   Ht 5\' 6"  (1.676 m)   Wt 148 lb (67.1 kg)   LMP  (LMP Unknown)   SpO2 96%   BMI 23.89 kg/m   68 y.o.female with blindness due to T2DM, allergic rhinitis presents for sudden onset of sore throat, running nose, and generally felt "very bad" - this began very suddenly 3 days ago. She denies headache or fever, denies chills. She denies any other symptoms. She denies n/v/d, rashes, denies arthralgias/myalgias. She has reports a non-productive cough, pain in chest with coughing. Denies shortness of breath or wheezing.   She reports she has tried tylenol for sore throat, vicks vapo rub on chest.  She is not currently on  allergy medications.    Past Medical History:  Diagnosis Date  . Allergy   . Blood transfusion without reported diagnosis   . Cataract    bilateral  . Diabetes mellitus without complication ([place])   . Diabetic retinopathy ([place])   . ESRD (end stage renal disease) ([place])   . Hashimoto's disease   . Hyperlipidemia   . Hypertension   . Hypothyroid   . Loss of vision    both eyes, no vision left eye and little in left      Allergies  Allergen Reactions  . Hydralazine Shortness Of Breath    Chest pain fatigue  . Azor [Amlodipine-Olmesartan] Other (See Comments)    Sharp chest pain  Sharp chest pain  Chest pain  . Hydrochlorothiazide W-Triamterene   . Labetalol     Per patient, caused foot pain  . Lisinopril Other (See Comments)    Severe radiating foot pain.  . Metformin Itching    Loss of bowel control, general sick feeling  . Metoclopramide Other (See Comments)  . Other Other (See Comments)    "contractions in throat" Diazide pt states cause throat to swell.  . Minoxidil Rash    Choking, neck contractions fatigue  . Spironolactone Other (See Comments) and Rash    Severe radiating pain in feet Severe radiating pain in feet.  . Statins Rash    Fatigue Cause fatigue    Current Outpatient Medications on File Prior to Visit  Medication Sig  . amLODipine (NORVASC) 2.5 MG tablet Take 2.5 mg by mouth daily.  Marland Kitchen  aspirin EC 81 MG tablet Take 81 mg by mouth daily.  . carvedilol (COREG) 6.25 MG tablet Take 6.25 mg by mouth 2 (two) times daily with a meal.  . Cholecalciferol (VITAMIN D3) 50000 UNITS CAPS Take 50,000 Units by mouth. With dialysis  . ezetimibe (ZETIA) 10 MG tablet Take 1 tablet (10 mg total) by mouth daily.  Marland Kitchen glucose blood (PRODIGY NO CODING BLOOD GLUC) test strip 1 each.  . levothyroxine (SYNTHROID, LEVOTHROID) 150 MCG tablet Take 1 tablet (150 mcg total) by mouth daily before breakfast. (Patient taking differently: Take 125 mcg by mouth daily before breakfast.  )  . OVER THE COUNTER MEDICATION Iron 65 mg 1 daily with dialysis  . Sulfamethoxazole-Trimethoprim (BACTRIM PO) Take by mouth.  . Cyanocobalamin (VITAMIN B 12 PO) Take 1,000 mcg by mouth daily. With dialysis  . rosuvastatin (CRESTOR) 40 MG tablet Take 1/2 to 1 tablet daily or as directed for Cholesterol  . sucroferric oxyhydroxide (VELPHORO) 500 MG chewable tablet Chew 500 mg by mouth 3 (three) times daily with meals.   No current facility-administered medications on file prior to visit.     ROS: all negative except above.   Physical Exam:  BP (!) 136/58   Pulse 76   Temp 98.1 F (36.7 C)   Ht 5\' 6"  (1.676 m)   Wt 148 lb (67.1 kg)   LMP  (LMP Unknown)   SpO2 96%   BMI 23.89 kg/m   General Appearance: Well nourished, in no apparent distress. Eyes: blind, conjunctiva no swelling or erythema Sinuses: No Frontal/maxillary tenderness ENT/Mouth: Ext aud canals clear, TMs without erythema, bulging. No erythema, swelling, or exudate on post pharynx.  Tonsils not swollen or erythematous. Hearing normal.  Neck: Supple, thyroid normal.  Respiratory: Respiratory effort normal, BS equal bilaterally without rales, rhonchi, wheezing or stridor.  Cardio: RRR with no MRGs. Brisk peripheral pulses without edema.  Abdomen: Soft, + BS.  Non tender Lymphatics: Non tender without lymphadenopathy.  Musculoskeletal: slow steady gait Skin: Warm, dry without rashes, lesions, ecchymosis.  Neuro: Normal muscle tone Psych: Awake and oriented X 3, normal affect, Insight and Judgment appropriate.     Izora Ribas, NP 4:18 PM [facility]

## 2018-06-14 DIAGNOSIS — Z5181 Encounter for therapeutic drug level monitoring: Secondary | ICD-10-CM | POA: Diagnosis not present

## 2018-06-14 DIAGNOSIS — B349 Viral infection, unspecified: Secondary | ICD-10-CM | POA: Diagnosis not present

## 2018-06-14 DIAGNOSIS — B259 Cytomegaloviral disease, unspecified: Secondary | ICD-10-CM | POA: Diagnosis not present

## 2018-06-14 DIAGNOSIS — Z940 Kidney transplant status: Secondary | ICD-10-CM | POA: Diagnosis not present

## 2018-06-20 DIAGNOSIS — Z79899 Other long term (current) drug therapy: Secondary | ICD-10-CM | POA: Diagnosis not present

## 2018-06-20 DIAGNOSIS — E119 Type 2 diabetes mellitus without complications: Secondary | ICD-10-CM | POA: Diagnosis not present

## 2018-06-20 DIAGNOSIS — Z940 Kidney transplant status: Secondary | ICD-10-CM | POA: Diagnosis not present

## 2018-06-20 DIAGNOSIS — Z4822 Encounter for aftercare following kidney transplant: Secondary | ICD-10-CM | POA: Diagnosis not present

## 2018-06-20 DIAGNOSIS — Z794 Long term (current) use of insulin: Secondary | ICD-10-CM | POA: Diagnosis not present

## 2018-06-20 DIAGNOSIS — I10 Essential (primary) hypertension: Secondary | ICD-10-CM | POA: Diagnosis not present

## 2018-06-20 DIAGNOSIS — H547 Unspecified visual loss: Secondary | ICD-10-CM | POA: Diagnosis not present

## 2018-06-28 ENCOUNTER — Encounter: Payer: Self-pay | Admitting: Podiatry

## 2018-06-28 ENCOUNTER — Ambulatory Visit (INDEPENDENT_AMBULATORY_CARE_PROVIDER_SITE_OTHER): Payer: Medicare Other | Admitting: Podiatry

## 2018-06-28 DIAGNOSIS — M79674 Pain in right toe(s): Secondary | ICD-10-CM | POA: Diagnosis not present

## 2018-06-28 DIAGNOSIS — M79675 Pain in left toe(s): Secondary | ICD-10-CM | POA: Diagnosis not present

## 2018-06-28 DIAGNOSIS — B351 Tinea unguium: Secondary | ICD-10-CM | POA: Diagnosis not present

## 2018-06-28 NOTE — Progress Notes (Signed)
Patient ID: Denise Bush, female   DOB: 01-01-1951, 68 y.o.   MRN: [id number] Complaint:  Visit Type: Patient returns to my office for continued preventative foot care services. Complaint: Patient states" my nails have grown long and thick and become painful to walk and wear shoes" Patient has been diagnosed with DM with no foot complications. The patient presents for preventative foot care services. No changes to ROS.  She has received a new kidney. Podiatric Exam: Vascular: dorsalis pedis and posterior tibial pulses are palpable bilateral. Capillary return is immediate. Temperature gradient is WNL. Skin turgor WNL  Sensorium: Diminished  Semmes Weinstein monofilament test. Normal tactile sensation bilaterally. Nail Exam: Pt has thick disfigured discolored nails with subungual debris noted bilateral entire nail hallux through fifth toenails Ulcer Exam: There is no evidence of ulcer or pre-ulcerative changes or infection. Orthopedic Exam: Muscle tone and strength are WNL. No limitations in general ROM. No crepitus or effusions noted. Foot type and digits show no abnormalities. Bony prominences are unremarkable. Skin:  Asymptomatic  Porokeratosis sub 5th met left foot. No infection or ulcers  Diagnosis:  Onychomycosis, , Pain in right toe, pain in left toes, .  Treatment & Plan Procedures and Treatment: Consent by patient was obtained for treatment procedures. The patient understood the discussion of treatment and procedures well. All questions were answered thoroughly reviewed. Debridement of mycotic and hypertrophic toenails, 1 through 5 bilateral and clearing of subungual debris. No ulceration, no infection noted.  .. Return Visit-Office Procedure: Patient instructed to return to the office for a follow up visit 4 months for continued evaluation and treatment.Gardiner Barefoot DPM

## 2018-07-10 DIAGNOSIS — B349 Viral infection, unspecified: Secondary | ICD-10-CM | POA: Diagnosis not present

## 2018-07-10 DIAGNOSIS — Z940 Kidney transplant status: Secondary | ICD-10-CM | POA: Diagnosis not present

## 2018-07-10 DIAGNOSIS — B259 Cytomegaloviral disease, unspecified: Secondary | ICD-10-CM | POA: Diagnosis not present

## 2018-07-10 DIAGNOSIS — Z5181 Encounter for therapeutic drug level monitoring: Secondary | ICD-10-CM | POA: Diagnosis not present

## 2018-07-16 ENCOUNTER — Other Ambulatory Visit: Payer: Self-pay

## 2018-07-16 ENCOUNTER — Other Ambulatory Visit (HOSPITAL_COMMUNITY): Payer: Self-pay | Admitting: *Deleted

## 2018-07-17 ENCOUNTER — Encounter (HOSPITAL_COMMUNITY)
Admission: RE | Admit: 2018-07-17 | Discharge: 2018-07-17 | Disposition: A | Discharge status: Home / Self Care | Payer: Medicare Other | Source: Ambulatory Visit | Attending: Internal Medicine | Admitting: Internal Medicine

## 2018-07-17 DIAGNOSIS — Z940 Kidney transplant status: Secondary | ICD-10-CM | POA: Insufficient documentation

## 2018-07-17 MED ORDER — SODIUM CHLORIDE 0.9 % IV SOLN
5.0000 mg/kg | INTRAVENOUS | Status: DC
  Administered 2018-07-17: 337.5 mg via INTRAVENOUS
  Filled 2018-07-17: qty 337.5

## 2018-07-25 ENCOUNTER — Encounter (HOSPITAL_COMMUNITY): Payer: Medicare Other

## 2018-08-01 DIAGNOSIS — Z940 Kidney transplant status: Secondary | ICD-10-CM | POA: Diagnosis not present

## 2018-08-01 DIAGNOSIS — B349 Viral infection, unspecified: Secondary | ICD-10-CM | POA: Diagnosis not present

## 2018-08-01 DIAGNOSIS — Z5181 Encounter for therapeutic drug level monitoring: Secondary | ICD-10-CM | POA: Diagnosis not present

## 2018-08-01 DIAGNOSIS — B259 Cytomegaloviral disease, unspecified: Secondary | ICD-10-CM | POA: Diagnosis not present

## 2018-08-02 ENCOUNTER — Other Ambulatory Visit: Payer: Self-pay

## 2018-08-02 DIAGNOSIS — N186 End stage renal disease: Principal | ICD-10-CM

## 2018-08-02 DIAGNOSIS — Z992 Dependence on renal dialysis: Secondary | ICD-10-CM

## 2018-08-02 DIAGNOSIS — E1122 Type 2 diabetes mellitus with diabetic chronic kidney disease: Secondary | ICD-10-CM

## 2018-08-02 MED ORDER — UNISTIK 2 NORMAL MISC: 3 refills | Status: DC

## 2018-08-05 DIAGNOSIS — E039 Hypothyroidism, unspecified: Secondary | ICD-10-CM | POA: Diagnosis not present

## 2018-08-06 ENCOUNTER — Other Ambulatory Visit: Payer: Self-pay

## 2018-08-06 MED ORDER — INSULIN PEN NEEDLE 31G X 5 MM MISC: 3 refills | Status: DC

## 2018-08-06 MED ORDER — INSULIN LISPRO (1 UNIT DIAL) 100 UNIT/ML (KWIKPEN): PEN_INJECTOR | SUBCUTANEOUS | 3 refills | Status: DC

## 2018-08-14 ENCOUNTER — Other Ambulatory Visit: Payer: Self-pay

## 2018-08-14 ENCOUNTER — Inpatient Hospital Stay (HOSPITAL_COMMUNITY): Admission: RE | Admit: 2018-08-14 | Payer: Medicare Other | Source: Ambulatory Visit

## 2018-08-15 ENCOUNTER — Ambulatory Visit (HOSPITAL_COMMUNITY)
Admission: RE | Admit: 2018-08-15 | Discharge: 2018-08-15 | Disposition: A | Discharge status: Home / Self Care | Payer: Medicare Other | Source: Ambulatory Visit | Attending: Internal Medicine | Admitting: Internal Medicine

## 2018-08-15 DIAGNOSIS — Z5181 Encounter for therapeutic drug level monitoring: Secondary | ICD-10-CM | POA: Diagnosis not present

## 2018-08-15 DIAGNOSIS — Z940 Kidney transplant status: Secondary | ICD-10-CM | POA: Insufficient documentation

## 2018-08-15 DIAGNOSIS — N185 Chronic kidney disease, stage 5: Secondary | ICD-10-CM | POA: Diagnosis not present

## 2018-08-15 DIAGNOSIS — B349 Viral infection, unspecified: Secondary | ICD-10-CM | POA: Diagnosis not present

## 2018-08-15 DIAGNOSIS — E039 Hypothyroidism, unspecified: Secondary | ICD-10-CM | POA: Diagnosis not present

## 2018-08-15 DIAGNOSIS — E1122 Type 2 diabetes mellitus with diabetic chronic kidney disease: Secondary | ICD-10-CM | POA: Diagnosis not present

## 2018-08-15 DIAGNOSIS — B259 Cytomegaloviral disease, unspecified: Secondary | ICD-10-CM | POA: Diagnosis not present

## 2018-08-15 MED ORDER — SODIUM CHLORIDE 0.9 % IV SOLN
5.0000 mg/kg | INTRAVENOUS | Status: DC
  Administered 2018-08-15: 350 mg via INTRAVENOUS
  Filled 2018-08-15: qty 350

## 2018-08-29 DIAGNOSIS — I10 Essential (primary) hypertension: Secondary | ICD-10-CM | POA: Diagnosis not present

## 2018-08-29 DIAGNOSIS — K5900 Constipation, unspecified: Secondary | ICD-10-CM | POA: Diagnosis not present

## 2018-08-29 DIAGNOSIS — E782 Mixed hyperlipidemia: Secondary | ICD-10-CM | POA: Diagnosis not present

## 2018-08-29 DIAGNOSIS — E1165 Type 2 diabetes mellitus with hyperglycemia: Secondary | ICD-10-CM | POA: Diagnosis not present

## 2018-08-29 DIAGNOSIS — Z4822 Encounter for aftercare following kidney transplant: Secondary | ICD-10-CM | POA: Diagnosis not present

## 2018-08-29 DIAGNOSIS — B1920 Unspecified viral hepatitis C without hepatic coma: Secondary | ICD-10-CM | POA: Diagnosis not present

## 2018-08-29 DIAGNOSIS — Z940 Kidney transplant status: Secondary | ICD-10-CM | POA: Diagnosis not present

## 2018-08-29 DIAGNOSIS — Z79899 Other long term (current) drug therapy: Secondary | ICD-10-CM | POA: Diagnosis not present

## 2018-09-06 DIAGNOSIS — Z5181 Encounter for therapeutic drug level monitoring: Secondary | ICD-10-CM | POA: Diagnosis not present

## 2018-09-06 DIAGNOSIS — Z940 Kidney transplant status: Secondary | ICD-10-CM | POA: Diagnosis not present

## 2018-09-06 DIAGNOSIS — B349 Viral infection, unspecified: Secondary | ICD-10-CM | POA: Diagnosis not present

## 2018-09-06 DIAGNOSIS — B259 Cytomegaloviral disease, unspecified: Secondary | ICD-10-CM | POA: Diagnosis not present

## 2018-09-10 ENCOUNTER — Other Ambulatory Visit: Payer: Self-pay

## 2018-09-10 DIAGNOSIS — E063 Autoimmune thyroiditis: Secondary | ICD-10-CM

## 2018-09-10 DIAGNOSIS — E038 Other specified hypothyroidism: Secondary | ICD-10-CM

## 2018-09-10 MED ORDER — LEVOTHYROXINE SODIUM 150 MCG PO TABS: ORAL_TABLET | ORAL | Status: DC

## 2018-09-12 ENCOUNTER — Other Ambulatory Visit: Payer: Self-pay

## 2018-09-12 ENCOUNTER — Encounter (HOSPITAL_COMMUNITY)
Admission: RE | Admit: 2018-09-12 | Discharge: 2018-09-12 | Disposition: A | Discharge status: Home / Self Care | Payer: Medicare Other | Source: Ambulatory Visit | Attending: Internal Medicine | Admitting: Internal Medicine

## 2018-09-12 DIAGNOSIS — Z940 Kidney transplant status: Secondary | ICD-10-CM | POA: Insufficient documentation

## 2018-09-12 MED ORDER — SODIUM CHLORIDE 0.9 % IV SOLN
5.0000 mg/kg | INTRAVENOUS | Status: DC
  Administered 2018-09-12: 350 mg via INTRAVENOUS
  Filled 2018-09-12 (×2): qty 350

## 2018-09-13 ENCOUNTER — Encounter: Payer: Self-pay | Admitting: Adult Health Nurse Practitioner

## 2018-09-13 ENCOUNTER — Ambulatory Visit: Payer: Medicare Other | Admitting: Adult Health Nurse Practitioner

## 2018-09-13 VITALS — BP 124/61 | HR 63 | Ht 65.0 in | Wt 162.0 lb

## 2018-09-13 DIAGNOSIS — Z940 Kidney transplant status: Secondary | ICD-10-CM | POA: Diagnosis not present

## 2018-09-13 DIAGNOSIS — H548 Legal blindness, as defined in USA: Secondary | ICD-10-CM

## 2018-09-13 DIAGNOSIS — Z0001 Encounter for general adult medical examination with abnormal findings: Secondary | ICD-10-CM

## 2018-09-13 DIAGNOSIS — M150 Primary generalized (osteo)arthritis: Secondary | ICD-10-CM | POA: Diagnosis not present

## 2018-09-13 DIAGNOSIS — E782 Mixed hyperlipidemia: Secondary | ICD-10-CM

## 2018-09-13 DIAGNOSIS — E038 Other specified hypothyroidism: Secondary | ICD-10-CM

## 2018-09-13 DIAGNOSIS — E1136 Type 2 diabetes mellitus with diabetic cataract: Secondary | ICD-10-CM | POA: Diagnosis not present

## 2018-09-13 DIAGNOSIS — Z79899 Other long term (current) drug therapy: Secondary | ICD-10-CM

## 2018-09-13 DIAGNOSIS — R6889 Other general symptoms and signs: Secondary | ICD-10-CM | POA: Diagnosis not present

## 2018-09-13 DIAGNOSIS — E063 Autoimmune thyroiditis: Secondary | ICD-10-CM | POA: Diagnosis not present

## 2018-09-13 DIAGNOSIS — E559 Vitamin D deficiency, unspecified: Secondary | ICD-10-CM

## 2018-09-13 DIAGNOSIS — M159 Polyosteoarthritis, unspecified: Secondary | ICD-10-CM

## 2018-09-13 DIAGNOSIS — I10 Essential (primary) hypertension: Secondary | ICD-10-CM

## 2018-09-13 DIAGNOSIS — Z0000 Encounter for general adult medical examination without abnormal findings: Secondary | ICD-10-CM

## 2018-09-13 NOTE — Progress Notes (Signed)
Virtual Visit via Telephone Note  I connected with Denise Bush on 09/13/18 at 10:30 AM EDT by telephone and verified that I am speaking with the correct person using two identifiers.   I discussed the limitations, risks, security and privacy concerns of performing an evaluation and management service by telephone and the availability of in person appointments. I also discussed with the patient that there may be a patient responsible charge related to this service. The patient expressed understanding and agreed to proceed.   MEDICARE ANNUAL WELLNESS VISIT AND FOLLOW UP Assessment:    Diagnoses and all orders for this visit:  Medicare annual wellness visit, subsequent Yearly  Essential hypertension Continue medication: Monitor blood pressure at home; call if consistently over 130/80 Continue DASH diet.   Reminder to go to the ER if any CP, SOB, nausea, dizziness, severe HA, changes vision/speech, left arm numbness and tingling and jaw pain.  Kidney replaced by transplant Next appointment 2 months for follow up and labs Sirolimus daily  Diabetes mellitus with cataract (HCC) Monitor glucose BID and record Using Humalog sliding scale at this time Hyperglycemia, fasting Discussed snack prior to bed Follow up with Endocrinology, appointment next week  Hypothyroidism due to Hashimoto's thyroiditis Taking levothyroxine '125mg'$  5 days a week.  Primary osteoarthritis involving multiple joints Elevate right knee with good results Warm moist heat Continue to monitor  Mixed hyperlipidemia Continue medications: Continue low cholesterol diet and exercise.  Check lipid panel.   Legally blind Follows with ophthalmology Q28month Husband at home for asstance   Vitamin D deficiency Continue supplementation  Medication management Continued    Follow Up Instructions:    I discussed the assessment and treatment plan with the patient. The patient was provided an opportunity to  ask questions and all were answered. The patient agreed with the plan and demonstrated an understanding of the instructions.   The patient was advised to call back or seek an in-person evaluation if the symptoms worsen or if the condition fails to improve as anticipated.  I provided 30 minutes of non-face-to-face time during this encounter including counseling, chart review, and critical decision making was preformed.   Future Appointments  Date Time Provider DByron 10/25/2018  9:00 AM MGardiner Barefoot DPM [facility]  11/07/2018  3:00 PM MUnk Pinto MD [facility] None      Plan:   During the course of the visit the patient was educated and counseled about appropriate screening and preventive services including:    Pneumococcal vaccine   Influenza vaccine  Prevnar 13  Td vaccine  Scre[place]ogram, deferred, [place]  Colorectal cancer screening  Diabetes screening  Glaucoma screening  Nutrition counseling    Subjective:  Denise LICHTERis a 68y.o. female who presents for Medicare Annual Wellness Visit and 3 month follow up for HTN, hyperlipidemia,DMII, legally blind, hypothyroidism, ESRD and now S/P recipient of kidney transplant and vitamin D Def.   [facility]  Two weeks ago she reports that her Sirolimus '1MG'$  was changed to taking daily instead of three days a week related to low BK viremia.  Also her norvasc was increased to '5mg'$  daily.  She denies any side effects with this.  Reports about 6 days ago she starting running high with her blood glucose.  She has contacted her endocrinologist.  He fasting glucose has been 200-240 increase 110-170.  She has been using her sliding scale every am and also at night. She  reports they eat dinner around 6pm at night.  She does not go to sleep until 1-2am.  Then she typically eats breakfast around 9am.  Reports she does not have a snack in the evenings.   Reports her diet has not changed and she watches the foods she eats monitoring carbohydrates and sugars.  She has a phone visit coming up on Wednesday.    She also reports some right knee pain that is intermittent and 4/10 with and achy quality.  When this bothers her she reports she elevates her right leg and this helps to relieve the discomfort.  She has not taken any OTC medications or  Reports she had a fall in February where she fell over a concrete stop in a parking garage.  She did hit her head.  She did not seek treatment for this.  She reports she had a headache for several days and large bump on her head.  This has now resolved and she has not any falls since.  Her blood pressure has been controlled at home.  She is unable to read the numbers on the machine.  Her husband usually does this for her. Today their BP is BP: 124/61 She does workout. She denies chest pain, shortness of breath, dizziness.  She is on cholesterol medication and denies myalgias. Her cholesterol is not at goal. The cholesterol last visit was:   Lab Results  Component Value Date   CHOL 199 10/03/2017   HDL 40 (L) 10/03/2017   LDLCALC 123 (H) 10/03/2017   TRIG 238 (H) 10/03/2017   CHOLHDL 5.0 (H) 10/03/2017   She has been working on diet and exercise forDMII, and denies foot ulcerations, hypoglycemia , increased appetite, nausea, paresthesia of the feet, polydipsia, polyuria, visual disturbances, vomiting and weight loss. Last A1C in the office was:  Lab Results  Component Value Date   HGBA1C 7.0 (H) 10/03/2017   Last GFR Lab Results  Component Value Date   GFRNONAA 4 (L) 10/03/2017     Lab Results  Component Value Date   GFRAA 5 (L) 10/03/2017   Patient is on Vitamin D supplement.   Lab Results  Component Value Date   VD25OH 27 (L) 10/03/2017      Medication Review:  Current Outpatient Medications (Endocrine & Metabolic):  .  insulin lispro (HUMALOG) 100 UNIT/ML KwikPen, Take 5 units breakfast  and dinner, 6 units with lunch + sliding scale. .  levothyroxine (SYNTHROID) 150 MCG tablet, Take 135mg daily except for Wed and Sun  Current Outpatient Medications (Cardiovascular):  .  amLODipine (NORVASC) 2.5 MG tablet, Take 2.5 mg by mouth daily. .  carvedilol (COREG) 6.25 MG tablet, Take 6.25 mg by mouth 2 (two) times daily with a meal. .  ezetimibe (ZETIA) 10 MG tablet, Take 1 tablet (10 mg total) by mouth daily.   Current Outpatient Medications (Analgesics):  .  aspirin EC 81 MG tablet, Take 81 mg by mouth daily.   Current Outpatient Medications (Other):  .  Blood Glucose Monitoring Suppl (FIFTY50 GLUCOSE METER 2.0) w/Device KIT, Use as directed Product selection permitted according to insurance preference. E11.9 Type 2 diabetes mellitus .  Blood Glucose Monitoring Suppl (RELION PREMIER VOICE MONITOR) DEVI, See admin instructions. .  Continuous Blood Gluc Receiver (FREESTYLE LIBRE 14 DAY READER) DEVI, Use 1 Application as directed .  Continuous Blood Gluc Sensor (FREESTYLE LIBRE 14 DAY SENSOR) MISC, Use 1 Device every 14 (fourteen) days .  glucose blood (PRODIGY NO CODING BLOOD GLUC) test  strip, 1 each. .  Insulin Pen Needle (FIFTY50 PEN NEEDLES) 31G X 5 MM MISC, Test sugars Three to four times daily .  Lancets Misc. (UNISTIK 2 NORMAL) MISC, Use 1 each 4 (four) times daily Product selection permitted according to insurance preference. E11.9 Type 2 diabetes mellitus .  sirolimus (RAPAMUNE) 1 MG tablet,  .  Sulfamethoxazole-Trimethoprim (BACTRIM PO), Take by mouth.  Allergies: Allergies  Allergen Reactions  . Hydralazine Shortness Of Breath    Chest pain fatigue Chest pain  . Azor [Amlodipine-Olmesartan] Other (See Comments)    Sharp chest pain  Sharp chest pain  Chest pain  . Hydrochlorothiazide W-Triamterene   . Labetalol     Per patient, caused foot pain  . Lisinopril Other (See Comments)    Severe radiating foot pain.  . Metformin Itching    Loss of bowel control,  general sick feeling  . Metoclopramide Other (See Comments)  . Other Other (See Comments)    "contractions in throat" Diazide pt states cause throat to swell.  . Minoxidil Rash    Choking, neck contractions fatigue  . Spironolactone Other (See Comments) and Rash    Severe radiating pain in feet Severe radiating pain in feet.  . Statins Rash    Fatigue Cause fatigue    Current Problems (verified) has Hypothyroidism; Essential hypertension; Allergic rhinitis; Mixed hyperlipidemia; Vitamin D deficiency; Medication management; Diabetic proliferative retinopathy ([place]); Arthritis, degenerative; Body mass index (BMI) 24.0-24.9, adult; End stage renal disease on dialysis due to type 2 diabetes mellitus ([place]); Hyperparathyroidism, secondary renal ([place]); Diabetes mellitus with cataract ([place]); and Kidney replaced by transplant on their problem list.  Screening Tests Immunization History  Administered Date(s) Administered  . Influenza Whole 04/23/2007  . Influenza-Unspecified 01/05/2014, 02/28/2016  . Pneumococcal-Unspecified 01/05/2010    Preventative care: Last colonoscopy: 2017 Mammogram: 05/2018 DEXA: DUE, will place order  Prior vaccinations: TD or Tdap: DUE, check with Dr Lynnette Caffey Influenza: 2019  Pneumococcal: 2011 Prevnar13: DUE, check with Dr Lynnette Caffey Shingles/Zostavax: N/A, kidney transplant   Names of Other Physician/Practitioners you currently use: 1. [facility] here for primary care 2. Eye Exam: Every 6 months Dr Raylene Miyamoto  3.Dental Exam: 06/2018   Patient Care Team: Unk Pinto, MD as PCP - General (Internal Medicine) Rexene Agent, MD as Attending Physician (Nephrology) Edrick Oh, MD as Consulting Physician (Nephrology) Gardiner Barefoot, DPM as Consulting Physician (Podiatry) Raylene Miyamoto, Jake Samples, MD as Referring Physician (Ophthalmology)  Surgical: She  has a past surgical history that includes Eye surgery; Abdominal  hysterectomy; Knee surgery; Colonoscopy; and Breast biopsy (Right, 2015). Family Her family history includes Breast cancer in her maternal aunt, maternal aunt, maternal grandmother, and sister; CAD in an other family member; Colon polyps in her maternal aunt and maternal uncle. Social history  She reports that she has never smoked. She has never used smokeless tobacco. She reports that she does not drink alcohol. No history on file for drug.  MEDICARE WELLNESS OBJECTIVES: Physical activity: Current Exercise Habits: Home exercise routine, Type of exercise: walking, Time (Minutes): 30, Frequency (Times/Week): 5, Weekly Exercise (Minutes/Week): 150, Intensity: Mild, Exercise limited by: Other - see comments(Visual impairment) Cardiac risk factors: Cardiac Risk Factors include: advanced age (>104mn, >>41women);diabetes mellitus;hypertension;Other (see comment)(Visual impairment) Depression/mood screen:   Depression screen [facility]2/[facility] 09/13/2018  Decreased Interest 0  Down, Depressed, Hopeless 0  PHQ - 2 Score 0    ADLs:  In your present state of health, do you have any difficulty performing the  following activities: 09/13/2018 01/15/2018  Hearing? N N  Vision? Y N  Comment - -  Difficulty concentrating or making decisions? N N  Walking or climbing stairs? N N  Dressing or bathing? N N  Doing errands, shopping? Y Y  Comment Legally blind, husbands assists. needs Copywriter, advertising and eating ? Y -  Using the Toilet? N -  In the past six months, have you accidently leaked urine? N -  Do you have problems with loss of bowel control? N -  Managing your Medications? Y -  Managing your Finances? Y -  Housekeeping or managing your Housekeeping? Y -  Some recent data might be hidden     Cognitive Testing  Alert? Yes  Normal Appearance?Yes  Oriented to person? Yes  Place? Yes   Time? Yes  Recall of three objects?  Yes  Can perform simple calculations? Yes  Displays appropriate  judgment?Yes  Can read the correct time from a watch face?Yes  EOL planning: Does Patient Have a Medical Advance Directive?: Yes Type of Advance Directive: Healthcare Power of Attorney, Living will Does patient want to make changes to medical advance directive?: Yes (Inpatient - patient defers changing a medical advance directive at this time - Information given) Copy of [place] in Chart?: No - copy requested   Objective:   Today's Vitals   09/13/18 1046  BP: 124/61  Pulse: 63  Weight: 162 lb (73.5 kg)   Body mass index is 26.96 kg/m.  General : Well sounding patient in no apparent distress HEENT: no hoarseness, no cough for duration of visit Lungs: speaks in complete sentences, no audible wheezing, no apparent distress Neurological: alert, oriented x 3 Psychiatric: pleasant, judgement appropriate   Medicare Attestation I have personally reviewed: The patient's medical and social history Their use of alcohol, tobacco or illicit drugs Their current medications and supplements The patient's functional ability including ADLs,fall risks, home safety risks, cognitive, and hearing and visual impairment Diet and physical activities Evidence for depression or mood disorders  The patient's weight, height, BMI, and visual acuity have been recorded in the chart.  I have made referrals, counseling, and provided education to the patient based on review of the above and I have provided the patient with a written personalized care plan for preventive services.     Garnet Sierras, NP [facility] 09/13/2018  12:38 PM

## 2018-09-18 DIAGNOSIS — E1122 Type 2 diabetes mellitus with diabetic chronic kidney disease: Secondary | ICD-10-CM | POA: Diagnosis not present

## 2018-09-18 DIAGNOSIS — N185 Chronic kidney disease, stage 5: Secondary | ICD-10-CM | POA: Diagnosis not present

## 2018-09-27 DIAGNOSIS — B259 Cytomegaloviral disease, unspecified: Secondary | ICD-10-CM | POA: Diagnosis not present

## 2018-09-27 DIAGNOSIS — Z5181 Encounter for therapeutic drug level monitoring: Secondary | ICD-10-CM | POA: Diagnosis not present

## 2018-09-27 DIAGNOSIS — Z940 Kidney transplant status: Secondary | ICD-10-CM | POA: Diagnosis not present

## 2018-09-27 DIAGNOSIS — E1122 Type 2 diabetes mellitus with diabetic chronic kidney disease: Secondary | ICD-10-CM | POA: Diagnosis not present

## 2018-09-27 DIAGNOSIS — N185 Chronic kidney disease, stage 5: Secondary | ICD-10-CM | POA: Diagnosis not present

## 2018-09-27 DIAGNOSIS — B349 Viral infection, unspecified: Secondary | ICD-10-CM | POA: Diagnosis not present

## 2018-10-14 ENCOUNTER — Ambulatory Visit (HOSPITAL_COMMUNITY)
Admission: RE | Admit: 2018-10-14 | Discharge: 2018-10-14 | Disposition: A | Discharge status: Home / Self Care | Payer: Medicare Other | Source: Ambulatory Visit | Attending: Internal Medicine | Admitting: Internal Medicine

## 2018-10-14 ENCOUNTER — Other Ambulatory Visit: Payer: Self-pay

## 2018-10-14 DIAGNOSIS — Z940 Kidney transplant status: Secondary | ICD-10-CM | POA: Insufficient documentation

## 2018-10-14 DIAGNOSIS — E119 Type 2 diabetes mellitus without complications: Secondary | ICD-10-CM | POA: Diagnosis not present

## 2018-10-14 MED ORDER — SODIUM CHLORIDE 0.9 % IV SOLN
5.0000 mg/kg | INTRAVENOUS | Status: DC
  Administered 2018-10-14: 362.5 mg via INTRAVENOUS
  Filled 2018-10-14: qty 362.5

## 2018-10-23 DIAGNOSIS — E039 Hypothyroidism, unspecified: Secondary | ICD-10-CM | POA: Diagnosis not present

## 2018-10-25 ENCOUNTER — Ambulatory Visit (INDEPENDENT_AMBULATORY_CARE_PROVIDER_SITE_OTHER): Payer: Medicare Other | Admitting: Podiatry

## 2018-10-25 ENCOUNTER — Encounter: Payer: Self-pay | Admitting: Podiatry

## 2018-10-25 ENCOUNTER — Other Ambulatory Visit: Payer: Self-pay

## 2018-10-25 DIAGNOSIS — M79675 Pain in left toe(s): Secondary | ICD-10-CM

## 2018-10-25 DIAGNOSIS — B259 Cytomegaloviral disease, unspecified: Secondary | ICD-10-CM | POA: Diagnosis not present

## 2018-10-25 DIAGNOSIS — B349 Viral infection, unspecified: Secondary | ICD-10-CM | POA: Diagnosis not present

## 2018-10-25 DIAGNOSIS — B351 Tinea unguium: Secondary | ICD-10-CM

## 2018-10-25 DIAGNOSIS — E1142 Type 2 diabetes mellitus with diabetic polyneuropathy: Secondary | ICD-10-CM | POA: Diagnosis not present

## 2018-10-25 DIAGNOSIS — Z5181 Encounter for therapeutic drug level monitoring: Secondary | ICD-10-CM | POA: Diagnosis not present

## 2018-10-25 DIAGNOSIS — E1140 Type 2 diabetes mellitus with diabetic neuropathy, unspecified: Secondary | ICD-10-CM | POA: Insufficient documentation

## 2018-10-25 DIAGNOSIS — M79674 Pain in right toe(s): Secondary | ICD-10-CM

## 2018-10-25 DIAGNOSIS — Z940 Kidney transplant status: Secondary | ICD-10-CM | POA: Diagnosis not present

## 2018-10-25 NOTE — Progress Notes (Signed)
Patient ID: Denise Bush, female   DOB: July 12, 1950, 68 y.o.   MRN: [id number] Complaint:  Visit Type: Patient returns to my office for continued preventative foot care services. Complaint: Patient states" my nails have grown long and thick and become painful to walk and wear shoes" Patient has been diagnosed with DM with no foot complications. The patient presents for preventative foot care services. No changes to ROS.  She has received a new kidney. Podiatric Exam: Vascular: dorsalis pedis and posterior tibial pulses are palpable bilateral. Capillary return is immediate. Temperature gradient is WNL. Skin turgor WNL  Sensorium: Diminished  Semmes Weinstein monofilament test. Normal tactile sensation bilaterally. Nail Exam: Pt has thick disfigured discolored nails with subungual debris noted bilateral entire nail hallux through fifth toenails Ulcer Exam: There is no evidence of ulcer or pre-ulcerative changes or infection. Orthopedic Exam: Muscle tone and strength are WNL. No limitations in general ROM. No crepitus or effusions noted. Foot type and digits show no abnormalities. Bony prominences are unremarkable. Skin:  Asymptomatic  Porokeratosis sub 5th met left foot. No infection or ulcers  Diagnosis:  Onychomycosis, , Pain in right toe, pain in left toes, .  Treatment & Plan Procedures and Treatment: Consent by patient was obtained for treatment procedures. The patient understood the discussion of treatment and procedures well. All questions were answered thoroughly reviewed. Debridement of mycotic and hypertrophic toenails, 1 through 5 bilateral and clearing of subungual debris. No ulceration, no infection noted.  .. Return Visit-Office Procedure: Patient instructed to return to the office for a follow up visit 4 months for continued evaluation and treatment.Gardiner Barefoot DPM

## 2018-10-31 DIAGNOSIS — Z940 Kidney transplant status: Secondary | ICD-10-CM | POA: Diagnosis not present

## 2018-11-07 ENCOUNTER — Encounter: Payer: Self-pay | Admitting: Internal Medicine

## 2018-11-07 ENCOUNTER — Ambulatory Visit: Payer: Self-pay | Admitting: Internal Medicine

## 2018-11-07 NOTE — Progress Notes (Addendum)
       N  O         S  H  O  W                                                                                                                                                                                                                                           This very nice 68 y.o. [name]  presents for a Screening /Preventative Visit & comprehensive evaluation and management of multiple medical co-morbidities.  Patient has been followed for HTN, HLD, T2_DM  Prediabetes /ESRD  and Vitamin D Deficiency.      HTN predates since     . Patient's BP has been controlled at home and patient denies any cardiac symptoms as chest pain, palpitations, shortness of breath, dizziness or ankle swelling. Today's        Patient's hyperlipidemia is controlled with diet and medications. Patient denies myalgias or other medication SE's. Last lipids were  Lab Results  Component Value Date   CHOL 199 10/03/2017   HDL 40 (L) 10/03/2017   LDLCALC 123 (H) 10/03/2017   TRIG 238 (H) 10/03/2017   CHOLHDL 5.0 (H) 10/03/2017      Patient has hx/o insulin req  T2_DM predating circa 1988.  Patient had been off of Treatment since prior to starting dialysis about 3 years ago. In Sept 2019 , she received a DDKT. Since her Transplant she has required increasing mends to control her Diabetes & is managed at [facility]. and patient denies reactive hypoglycemic symptoms, visual blurring, diabetic polys or paresthesias. Last A1c was  Lab Results  Component Value Date   HGBA1C 7.0 (H) 10/03/2017       Patient was initiated on Thyroid replacement at  age 26 (73) for [place].     Finally, patient has history of Vitamin D Deficiency and last Vitamin D was still low:

## 2018-11-11 ENCOUNTER — Ambulatory Visit (HOSPITAL_COMMUNITY)
Admission: RE | Admit: 2018-11-11 | Discharge: 2018-11-11 | Disposition: A | Discharge status: Home / Self Care | Payer: Medicare Other | Source: Ambulatory Visit | Attending: Internal Medicine | Admitting: Internal Medicine

## 2018-11-11 ENCOUNTER — Other Ambulatory Visit: Payer: Self-pay

## 2018-11-11 DIAGNOSIS — Z4822 Encounter for aftercare following kidney transplant: Secondary | ICD-10-CM | POA: Insufficient documentation

## 2018-11-11 MED ORDER — SODIUM CHLORIDE 0.9 % IV SOLN
5.0000 mg/kg | INTRAVENOUS | Status: DC
  Administered 2018-11-11: 362.5 mg via INTRAVENOUS
  Filled 2018-11-11: qty 362.5

## 2018-11-21 ENCOUNTER — Other Ambulatory Visit: Payer: Self-pay

## 2018-11-21 ENCOUNTER — Ambulatory Visit (INDEPENDENT_AMBULATORY_CARE_PROVIDER_SITE_OTHER): Payer: Medicare Other | Admitting: Internal Medicine

## 2018-11-21 ENCOUNTER — Other Ambulatory Visit: Payer: Self-pay | Admitting: Internal Medicine

## 2018-11-21 ENCOUNTER — Encounter: Payer: Self-pay | Admitting: Internal Medicine

## 2018-11-21 VITALS — BP 128/66 | HR 66 | Temp 97.3°F | Ht 65.0 in | Wt 168.0 lb

## 2018-11-21 DIAGNOSIS — E113519 Type 2 diabetes mellitus with proliferative diabetic retinopathy with macular edema, unspecified eye: Secondary | ICD-10-CM

## 2018-11-21 DIAGNOSIS — B259 Cytomegaloviral disease, unspecified: Secondary | ICD-10-CM | POA: Diagnosis not present

## 2018-11-21 DIAGNOSIS — N951 Menopausal and female climacteric states: Secondary | ICD-10-CM

## 2018-11-21 DIAGNOSIS — E0822 Diabetes mellitus due to underlying condition with diabetic chronic kidney disease: Secondary | ICD-10-CM

## 2018-11-21 DIAGNOSIS — Z5181 Encounter for therapeutic drug level monitoring: Secondary | ICD-10-CM | POA: Diagnosis not present

## 2018-11-21 DIAGNOSIS — Z940 Kidney transplant status: Secondary | ICD-10-CM | POA: Diagnosis not present

## 2018-11-21 DIAGNOSIS — N186 End stage renal disease: Secondary | ICD-10-CM | POA: Diagnosis not present

## 2018-11-21 DIAGNOSIS — E782 Mixed hyperlipidemia: Secondary | ICD-10-CM | POA: Diagnosis not present

## 2018-11-21 DIAGNOSIS — E559 Vitamin D deficiency, unspecified: Secondary | ICD-10-CM

## 2018-11-21 DIAGNOSIS — Z136 Encounter for screening for cardiovascular disorders: Secondary | ICD-10-CM

## 2018-11-21 DIAGNOSIS — Z79899 Other long term (current) drug therapy: Secondary | ICD-10-CM | POA: Diagnosis not present

## 2018-11-21 DIAGNOSIS — E039 Hypothyroidism, unspecified: Secondary | ICD-10-CM

## 2018-11-21 DIAGNOSIS — I700 Atherosclerosis of aorta: Secondary | ICD-10-CM

## 2018-11-21 DIAGNOSIS — I10 Essential (primary) hypertension: Secondary | ICD-10-CM

## 2018-11-21 DIAGNOSIS — N185 Chronic kidney disease, stage 5: Secondary | ICD-10-CM

## 2018-11-21 DIAGNOSIS — H548 Legal blindness, as defined in USA: Secondary | ICD-10-CM | POA: Diagnosis not present

## 2018-11-21 DIAGNOSIS — Z1211 Encounter for screening for malignant neoplasm of colon: Secondary | ICD-10-CM

## 2018-11-21 DIAGNOSIS — B349 Viral infection, unspecified: Secondary | ICD-10-CM | POA: Diagnosis not present

## 2018-11-21 DIAGNOSIS — Z794 Long term (current) use of insulin: Secondary | ICD-10-CM

## 2018-11-21 DIAGNOSIS — Z992 Dependence on renal dialysis: Secondary | ICD-10-CM | POA: Diagnosis not present

## 2018-11-21 DIAGNOSIS — E1122 Type 2 diabetes mellitus with diabetic chronic kidney disease: Secondary | ICD-10-CM | POA: Diagnosis not present

## 2018-11-21 DIAGNOSIS — Z8249 Family history of ischemic heart disease and other diseases of the circulatory system: Secondary | ICD-10-CM

## 2018-11-21 NOTE — Progress Notes (Signed)
[facility] Unk Pinto, M.D.     Uvaldo Bristle. Silverio Lay, P.A.-C [address]iane [address], [place], [place] [address] Telephone [phone] Telefax [phone] Comprehensive Evaluation &  Examination     This very nice 68 y.o.female presents for a comprehensive evaluation and management of multiple medical co-morbidities.  Patient has been followed for HTN, HLD, T2_NIDDM  and Vitamin D Deficiency.      HTN predates since 1998. Patient's BP has been controlled at home and patient denies any cardiac symptoms as chest pain, palpitations, shortness of breath, dizziness or ankle swelling. Today's BP is at goal - 128/66.      Patient's hyperlipidemia is not controlled with diet and medications. Patient denies myalgias or other medication SE's. Last lipids were not at goal: Lab Results  Component Value Date   CHOL 199 10/03/2017   HDL 40 (L) 10/03/2017   LDLCALC 123 (H) 10/03/2017   TRIG 238 (H) 10/03/2017   CHOLHDL 5.0 (H) 10/03/2017      Patient has hx/o T2_NIDDM predating since 1988 and started on Dialysis in Nov 2016. Patient is legally blind consequent of Diabetic Retinopathy (Total on the Left and to hand motion/finger counting on the Right).   Patient had tapered off of her Diabetic meds with declining Kidney functions til receiving her Kidney Transplant in Sept 2019. Since the Transplant her glucose intolerance has worsened requiring Insulin - both Lantus & Humalog & is managed by her Dr's at [facility].  Currently she's taking Lantus 8 mg /day and Humulin SS 3 x/day at meal times. Patient denies reactive hypoglycemic symptoms, visual blurring, diabetic polys or paresthesias. Last A1c was not at goal: Lab Results  Component Value Date   HGBA1C 7.0 (H) 10/03/2017      Patient was dx'd with Hashimoto's Dzat  age 89 (62) & has been on Thyroid replacement.     Finally, patient has history of Vitamin D  Deficiency ("25" / 2016) and last Vitamin D was very low: Lab Results  Component Value Date   VD25OH 22 (L) 10/03/2017   Current Outpatient Medications on File Prior to Visit  Medication Sig  . amLODipine (NORVASC) 2.5 MG tablet Take 5 mg by mouth daily.  Marland Kitchen aspirin EC 81 MG tablet Take 81 mg by mouth daily.  . belatacept (NULOJIX) 250 MG SOLR injection Inject into the vein.  . carvedilol (COREG) 6.25 MG tablet Take 6.25 mg by mouth 2 (two) times daily with a meal.  . Continuous Blood Gluc Receiver (FREESTYLE LIBRE 14 DAY READER) DEVI Use 1 Application as directed  . Continuous Blood Gluc Sensor (FREESTYLE LIBRE 14 DAY SENSOR) MISC Use 1 Device every 14 (fourteen) days  . ezetimibe (ZETIA) 10 MG tablet Take 1 tablet (10 mg total) by mouth daily.  . insulin glargine (LANTUS) 100 UNIT/ML injection Inject 8 Units into the skin daily.  . insulin lispro (HUMALOG) 100 UNIT/ML KwikPen Take 5 units breakfast and dinner, 6 units with lunch + sliding scale.  . Insulin Pen Needle (FIFTY50 PEN NEEDLES) 31G X 5 MM MISC Test sugars Three to four times daily  . levothyroxine (SYNTHROID) 150 MCG tablet Take 183mg daily except for Wed and Sun  . linagliptin (TRADJENTA) 5 MG TABS tablet Take by mouth.  . sirolimus (RAPAMUNE) 1 MG tablet Take 1 mg by mouth daily.  . Sulfamethoxazole-Trimethoprim (BACTRIM PO) Take by mouth.  . Blood Glucose Monitoring Suppl (FIFTY50 GLUCOSE METER 2.0) w/Device KIT Use  as directed Product selection permitted according to insurance preference. E11.9 Type 2 diabetes mellitus  . Blood Glucose Monitoring Suppl (RELION PREMIER VOICE MONITOR) DEVI See admin instructions.  Marland Kitchen glucose blood (PRODIGY NO CODING BLOOD GLUC) test strip 1 each.  . Lancets Misc. (UNISTIK 2 NORMAL) MISC Use 1 each 4 (four) times daily Product selection permitted according to insurance preference. E11.9 Type 2 diabetes mellitus  . UNABLE TO FIND Use 1 Application as directed  . UNABLE TO FIND Use 1 Device every  14 (fourteen) days   No current facility-administered medications on file prior to visit.    Allergies  Allergen Reactions  . Hydralazine Shortness Of Breath    Chest pain fatigue Chest pain  . Azor [Amlodipine-Olmesartan] Other (See Comments)    Sharp chest pain  Sharp chest pain  Chest pain  . Hydrochlorothiazide W-Triamterene   . Labetalol     Per patient, caused foot pain  . Lisinopril Other (See Comments)    Severe radiating foot pain.  . Metformin Itching    Loss of bowel control, general sick feeling  . Metoclopramide Other (See Comments)  . Other Other (See Comments)    "contractions in throat" Diazide pt states cause throat to swell.  . Minoxidil Rash    Choking, neck contractions fatigue  . Spironolactone Other (See Comments) and Rash    Severe radiating pain in feet Severe radiating pain in feet.  . Statins Rash    Fatigue Cause fatigue   Past Medical History:  Diagnosis Date  . Allergy   . Blood transfusion without reported diagnosis   . Cataract    bilateral  . Diabetes mellitus without complication ([place])   . Diabetic retinopathy ([place])   . ESRD (end stage renal disease) ([place])   . Hashimoto's disease   . Hyperlipidemia   . Hypertension   . Hypothyroid   . Loss of vision    both eyes, no vision left eye and little in left    Health Maintenance  Topic Date Due  . TETANUS/TDAP  08/24/1970  . DEXA SCAN  08/23/2016  . PNA vac Low Risk Adult (1 of 2 - PCV13) 08/23/2016  . OPHTHALMOLOGY EXAM  03/08/2017  . HEMOGLOBIN A1C  04/04/2018  . INFLUENZA VACCINE  11/30/2018  . FOOT EXAM  11/21/2019  . MAMMOGRAM  05/08/2020  . COLONOSCOPY  08/22/2025  . Hepatitis C Screening  Completed  . URINE MICROALBUMIN  Discontinued   Immunization History  Administered Date(s) Administered  . Influenza Whole 04/23/2007  . Influenza-Unspecified 01/05/2014, 02/28/2016  . Pneumococcal-Unspecified 01/05/2010   Last Colon - 08/23/2015- Dr Loletha Carrow - recc 10 yr f/u - due  Apr 2027  Last MGM - 05/08/2018  Past Surgical History:  Procedure Laterality Date  . ABDOMINAL HYSTERECTOMY    . BREAST BIOPSY Right 2015   benign  . COLONOSCOPY    . EYE SURGERY    . KNEE SURGERY     Family History  Problem Relation Age of Onset  . Breast cancer Sister   . CAD Other        No family history  . Colon polyps Maternal Aunt   . Breast cancer Maternal Aunt   . Colon polyps Maternal Uncle   . Breast cancer Maternal Grandmother   . Breast cancer Maternal Aunt   . Colon cancer Neg Hx    Social History   Tobacco Use  . Smoking status: Never Smoker  . Smokeless tobacco: Never Used  Substance Use Topics  .  Alcohol use: No    Alcohol/week: 0.0 standard drinks  . Drug use: Not on file    ROS Constitutional: Denies fever, chills, weight loss/gain, headaches, insomnia,  night sweats, and change in appetite. Does c/o fatigue. Eyes: Denies redness, blurred vision, diplopia, discharge, itchy, watery eyes.  ENT: Denies discharge, congestion, post nasal drip, epistaxis, sore throat, earache, hearing loss, dental pain, Tinnitus, Vertigo, Sinus pain, snoring.  Cardio: Denies chest pain, palpitations, irregular heartbeat, syncope, dyspnea, diaphoresis, orthopnea, PND, claudication, edema Respiratory: denies cough, dyspnea, DOE, pleurisy, hoarseness, laryngitis, wheezing.  Gastrointestinal: Denies dysphagia, heartburn, reflux, water brash, pain, cramps, nausea, vomiting, bloating, diarrhea, constipation, hematemesis, melena, hematochezia, jaundice, hemorrhoids Genitourinary: Denies dysuria, frequency, urgency, nocturia, hesitancy, discharge, hematuria, flank pain Breast: Breast lumps, nipple discharge, bleeding.  Musculoskeletal: Denies arthralgia, myalgia, stiffness, Jt. Swelling, pain, limp, and strain/sprain. Denies falls. Skin: Denies puritis, rash, hives, warts, acne, eczema, changing in skin lesion Neuro: No weakness, tremor, incoordination, spasms, paresthesia,  pain Psychiatric: Denies confusion, memory loss, sensory loss. Denies Depression. Endocrine: Denies change in weight, skin, hair change, nocturia, and paresthesia, diabetic polys, visual blurring, hyper / hypo glycemic episodes.  Heme/Lymph: No excessive bleeding, bruising, enlarged lymph nodes.  Physical Exam  BP 128/66   Pulse 66   Temp (!) 97.3 F (36.3 C)   Ht _0  (1.651 m)   Wt 168 lb (76.2 kg)   LMP  (LMP Unknown)   SpO2 98%   BMI 27.96 kg/m   General Appearance: Well nourished, well groomed and in no apparent distress.  Eyes: PERRLA, EOMs, conjunctiva no swelling or erythema, normal fundi and vessels. Sinuses: No frontal/maxillary tenderness ENT/Mouth: EACs patent / TMs  nl. Nares clear without erythema, swelling, mucoid exudates. Oral hygiene is good. No erythema, swelling, or exudate. Tongue normal, non-obstructing. Tonsils not swollen or erythematous. Hearing normal.  Neck: Supple, thyroid not palpable. No bruits, nodes or JVD. Respiratory: Respiratory effort normal.  BS equal and clear bilateral without rales, rhonci, wheezing or stridor. Cardio: Heart sounds are normal with regular rate and rhythm and no murmurs, rubs or gallops. Peripheral pulses are normal and equal bilaterally without edema. No aortic or femoral bruits. Chest: symmetric with normal excursions and percussion. Breasts: Symmetric, without lumps, nipple discharge, retractions, or fibrocystic changes.  Abdomen: Flat, soft with bowel sounds active. Nontender, no guarding, rebound, hernias, masses, or organomegaly.  Lymphatics: Non tender without lymphadenopathy.  Musculoskeletal: Full ROM all peripheral extremities, joint stability, 5/5 strength, and normal gait. Skin: Warm and dry without rashes, lesions, cyanosis, clubbing or  ecchymosis.  Neuro: Cranial nerves intact, reflexes equal bilaterally. Normal muscle tone, no cerebellar symptoms. Sensation intact to touch, vibratory and Monofilament to the toes  bilaterally. Pysch: Alert and oriented X 3, normal affect, Insight and Judgment appropriate.   Assessment and Plan  1. Essential hypertension  - EKG 12-Lead - Magnesium - TSH  2. Hyperlipidemia, mixed  - EKG 12-Lead - Lipid panel - TSH  3. Diabetes mellitus due to underlying condition with stage 5 chronic kidney disease not on chronic dialysis, with long-term current use of insulin (HCC)  - EKG 12-Lead - LOW EXTREMITY NEUR EXAM DOCUM - Hemoglobin A1c - HM DIABETES FOOT EXAM  4. Kidney replaced by transplant   5. Proliferative diabetic retinopathy with macular edema associated with type 2 diabetes mellitus, unspecified laterality (HCC)  - Hemoglobin A1c  6. Legally blind   7. Vitamin D deficiency  - VITAMIN D 25 Hydroxyl  8. Climacteric  - DG Bone Density; Future  9. Hypothyroidism - TSH  10. Aortic atherosclerosis (HCC)  - EKG 12-Lead  11. Screening for colorectal cancer  - POC Hemoccult Bld/Stl  12. Screening for ischemic heart disease  - EKG 12-Lead  13. FHx: heart disease  - EKG 12-Lead  14. Medication management  - Magnesium - Lipid panel - TSH - Hemoglobin A1c - VITAMIN D 25 Hydroxyl       Patient was counseled in prudent diet to achieve/maintain BMI less than 25 for weight control, BP monitoring, regular exercise and medications. Discussed med's effects and SE's. Screening labs and tests as requested with regular follow-up as recommended. Over 40 minutes of exam, counseling, chart review and high complex critical decision making was performed.  Kirtland Bouchard, MD

## 2018-11-21 NOTE — Patient Instructions (Signed)
- Vit D  And Vit C 1,000 mg  are recommended to help protect  against the Covid_19 and other Corona viruses.   - Also it's recommended to take Zinc 50 mg to help  protect against the Covid_19  And best place to get  is also on [employer] and don't pay more than 6-8 cents /pill !   ================================ Coronavirus (COVID-19) Are you at risk?  Are you at risk for the Coronavirus (COVID-19)?  To be considered HIGH RISK for Coronavirus (COVID-19), you have to meet the following criteria:  . Traveled to [place], [place], [place], [place] or [place]; or in the [place] to [place], [place], [place]  . or [place]; and have fever, cough, and shortness of breath within the last 2 weeks of travel OR . Been in close contact with a person diagnosed with COVID-19 within the last 2 weeks and have  . fever, cough,and shortness of breath .  . IF YOU DO NOT MEET THESE CRITERIA, YOU ARE CONSIDERED LOW RISK FOR COVID-19.  What to do if you are HIGH RISK for COVID-19?  Marland Kitchen If you are having a medical emergency, call 911. . Seek medical care right away. Before you go to a doctor's office, urgent care or emergency department, .  call ahead and tell them about your recent travel, contact with someone diagnosed with COVID-19  .  and your symptoms.  . You should receive instructions from your physician's office regarding next steps of care.  . When you arrive at healthcare provider, tell the healthcare staff immediately you have returned from  . visiting [place], [place], [place], [place] or [place]; or traveled in the [place] to [place], [place],  . [place] or [place] in the last two weeks or you have been in close contact with a person diagnosed with  . COVID-19 in the last 2 weeks.   . Tell the health care staff about your symptoms: fever, cough and shortness of breath. . After you have been seen by a medical provider, you will be either: o Tested for (COVID-19) and  discharged home on quarantine except to seek medical care if  o symptoms worsen, and asked to  - Stay home and avoid contact with others until you get your results (4-5 days)  - Avoid travel on public transportation if possible (such as bus, train, or airplane) or o Sent to the Emergency Department by EMS for evaluation, COVID-19 testing  and  o possible admission depending on your condition and test results.  What to do if you are LOW RISK for COVID-19?  Reduce your risk of any infection by using the same precautions used for avoiding the common cold or flu:  Marland Kitchen Wash your hands often with soap and warm water for at least 20 seconds.  If soap and water are not readily available,  . use an alcohol-based hand sanitizer with at least 60% alcohol.  . If coughing or sneezing, cover your mouth and nose by coughing or sneezing into the elbow areas of your shirt or coat, .  into a tissue or into your sleeve (not your hands). . Avoid shaking hands with others and consider head nods or verbal greetings only. . Avoid touching your eyes, nose, or mouth with unwashed hands.  . Avoid close contact with people who are sick. . Avoid places or events with large numbers of people in one location, like concerts or sporting events. . Carefully consider travel plans  you have or are making. . If you are planning any travel outside or inside the [place], visit the CDC's Travelers' Health webpage for the latest health notices. . If you have some symptoms but not all symptoms, continue to monitor at home and seek medical attention  . if your symptoms worsen. . If you are having a medical emergency, call 911.   . >>>>>>>>>>>>>>>>>>>>>>>>>>>>>>>>> . We Do NOT Approve of  [facility] [place] Soliciting Our Patients  To Do Home Visits & We Do NOT Approve of [employer] > > > > > > > > > > > > > > > > > > > > > > > > > > > > > > > > > > > > > > >  Preventive Care for Adults  A healthy lifestyle  and preventive care can promote health and wellness. Preventive health guidelines for women include the following key practices.  A routine yearly physical is a good way to check with your health care provider about your health and preventive screening. It is a chance to share any concerns and updates on your health and to receive a thorough exam.  Visit your dentist for a routine exam and preventive care every 6 months. Brush your teeth twice a day and floss once a day. Good oral hygiene prevents tooth decay and gum disease.  The frequency of eye exams is based on your age, health, family medical history, use of contact lenses, and other factors. Follow your health care provider's recommendations for frequency of eye exams.  Eat a healthy diet. Foods like vegetables, fruits, whole grains, low-fat dairy products, and lean protein foods contain the nutrients you need without too many calories. Decrease your intake of foods high in solid fats, added sugars, and salt. Eat the right amount of calories for you. Get information about a proper diet from your health care provider, if necessary.  Regular physical exercise is one of the most important things you can do for your health. Most adults should get at least 150 minutes of moderate-intensity exercise (any activity that increases your heart rate and causes you to sweat) each week. In addition, most adults need muscle-strengthening exercises on 2 or more days a week.  Maintain a healthy weight. The body mass index (BMI) is a screening tool to identify possible weight problems. It provides an estimate of body fat based on height and weight. Your health care provider can find your BMI and can help you achieve or maintain a healthy weight. For adults 20 years and older:  A BMI below 18.5 is considered underweight.  A BMI of 18.5 to 24.9 is normal.  A BMI of 25 to 29.9 is considered overweight.  A BMI of 30 and above is considered obese.  Maintain  normal blood lipids and cholesterol levels by exercising and minimizing your intake of saturated fat. Eat a balanced diet with plenty of fruit and vegetables. If your lipid or cholesterol levels are high, you are over 50, or you are at high risk for heart disease, you may need your cholesterol levels checked more frequently. Ongoing high lipid and cholesterol levels should be treated with medicines if diet and exercise are not working.  If you smoke, find out from your health care provider how to quit. If you do not use tobacco, do not start.  Lung cancer screening is recommended for adults aged 55-80 years who are at high risk for developing lung cancer because of a history   of smoking. A yearly low-dose CT scan of the lungs is recommended for people who have at least a 30-pack-year history of smoking and are a current smoker or have quit within the past 15 years. A pack year of smoking is smoking an average of 1 pack of cigarettes a day for 1 year (for example: 1 pack a day for 30 years or 2 packs a day for 15 years). Yearly screening should continue until the smoker has stopped smoking for at least 15 years. Yearly screening should be stopped for people who develop a health problem that would prevent them from having lung cancer treatment.  Avoid use of street drugs. Do not share needles with anyone. Ask for help if you need support or instructions about stopping the use of drugs.  High blood pressure causes heart disease and increases the risk of stroke.  Ongoing high blood pressure should be treated with medicines if weight loss and exercise do not work.  If you are 32-40 years old, ask your health care provider if you should take aspirin to prevent strokes.  Diabetes screening involves taking a blood sample to check your fasting blood sugar level. This should be done once every 3 years, after age 57, if you are within normal weight and without risk factors for diabetes. Testing should be considered  at a younger age or be carried out more frequently if you are overweight and have at least 1 risk factor for diabetes.  Breast cancer screening is essential preventive care for women. You should practice "breast self-awareness." This means understanding the normal appearance and feel of your breasts and may include breast self-examination. Any changes detected, no matter how small, should be reported to a health care provider. Women in their 52s and 30s should have a clinical breast exam (CBE) by a health care provider as part of a regular health exam every 1 to 3 years. After age 46, women should have a CBE every year. Starting at age 74, women should consider having a mammogram (breast X-ray test) every year. Women who have a family history of breast cancer should talk to their health care provider about genetic screening. Women at a high risk of breast cancer should talk to their health care providers about having an MRI and a mammogram every year.  Breast cancer gene (BRCA)-related cancer risk assessment is recommended for women who have family members with BRCA-related cancers. BRCA-related cancers include breast, ovarian, tubal, and peritoneal cancers. Having family members with these cancers may be associated with an increased risk for harmful changes (mutations) in the breast cancer genes BRCA1 and BRCA2. Results of the assessment will determine the need for genetic counseling and BRCA1 and BRCA2 testing.  Routine pelvic exams to screen for cancer are no longer recommended for nonpregnant women who are considered low risk for cancer of the pelvic organs (ovaries, uterus, and vagina) and who do not have symptoms. Ask your health care provider if a screening pelvic exam is right for you.  If you have had past treatment for cervical cancer or a condition that could lead to cancer, you need Pap tests and screening for cancer for at least 20 years after your treatment. If Pap tests have been discontinued,  your risk factors (such as having a new sexual partner) need to be reassessed to determine if screening should be resumed. Some women have medical problems that increase the chance of getting cervical cancer. In these cases, your health care provider may recommend more  frequent screening and Pap tests.    Colorectal cancer can be detected and often prevented. Most routine colorectal cancer screening begins at the age of 50 years and continues through age 75 years. However, your health care provider may recommend screening at an earlier age if you have risk factors for colon cancer. On a yearly basis, your health care provider may provide home test kits to check for hidden blood in the stool. Use of a small camera at the end of a tube, to directly examine the colon (sigmoidoscopy or colonoscopy), can detect the earliest forms of colorectal cancer. Talk to your health care provider about this at age 50, when routine screening begins.  Direct exam of the colon should be repeated every 5-10 years through age 75 years, unless early forms of pre-cancerous polyps or small growths are found.  Osteoporosis is a disease in which the bones lose minerals and strength with aging. This can result in serious bone fractures or breaks. The risk of osteoporosis can be identified using a bone density scan. Women ages 65 years and over and women at risk for fractures or osteoporosis should discuss screening with their health care providers. Ask your health care provider whether you should take a calcium supplement or vitamin D to reduce the rate of osteoporosis.  Menopause can be associated with physical symptoms and risks. Hormone replacement therapy is available to decrease symptoms and risks. You should talk to your health care provider about whether hormone replacement therapy is right for you.  Use sunscreen. Apply sunscreen liberally and repeatedly throughout the day. You should seek shade when your shadow is shorter  than you. Protect yourself by wearing long sleeves, pants, a wide-brimmed hat, and sunglasses year round, whenever you are outdoors.  Once a month, do a whole body skin exam, using a mirror to look at the skin on your back. Tell your health care provider of new moles, moles that have irregular borders, moles that are larger than a pencil eraser, or moles that have changed in shape or color.  Stay current with required vaccines (immunizations).  Influenza vaccine. All adults should be immunized every year.  Tetanus, diphtheria, and acellular pertussis (Td, Tdap) vaccine. Pregnant women should receive 1 dose of Tdap vaccine during each pregnancy. The dose should be obtained regardless of the length of time since the last dose. Immunization is preferred during the 27th-36th week of gestation. An adult who has not previously received Tdap or who does not know her vaccine status should receive 1 dose of Tdap. This initial dose should be followed by tetanus and diphtheria toxoids (Td) booster doses every 10 years. Adults with an unknown or incomplete history of completing a 3-dose immunization series with Td-containing vaccines should begin or complete a primary immunization series including a Tdap dose. Adults should receive a Td booster every 10 years.    Zoster vaccine. One dose is recommended for adults aged 60 years or older unless certain conditions are present.    Pneumococcal 13-valent conjugate (PCV13) vaccine. When indicated, a person who is uncertain of her immunization history and has no record of immunization should receive the PCV13 vaccine. An adult aged 19 years or older who has certain medical conditions and has not been previously immunized should receive 1 dose of PCV13 vaccine. This PCV13 should be followed with a dose of pneumococcal polysaccharide (PPSV23) vaccine. The PPSV23 vaccine dose should be obtained at least 1 or more year(s) after the dose of PCV13 vaccine. An adult   aged 43  years or older who has certain medical conditions and previously received 1 or more doses of PPSV23 vaccine should receive 1 dose of PCV13. The PCV13 vaccine dose should be obtained 1 or more years after the last PPSV23 vaccine dose.    Pneumococcal polysaccharide (PPSV23) vaccine. When PCV13 is also indicated, PCV13 should be obtained first. All adults aged 65 years and older should be immunized. An adult younger than age 32 years who has certain medical conditions should be immunized. Any person who resides in a nursing home or long-term care facility should be immunized. An adult smoker should be immunized. People with an immunocompromised condition and certain other conditions should receive both PCV13 and PPSV23 vaccines. People with human immunodeficiency virus (HIV) infection should be immunized as soon as possible after diagnosis. Immunization during chemotherapy or radiation therapy should be avoided. Routine use of PPSV23 vaccine is not recommended for American Indians, [place]atives, or people younger than 65 years unless there are medical conditions that require PPSV23 vaccine. When indicated, people who have unknown immunization and have no record of immunization should receive PPSV23 vaccine. One-time revaccination 5 years after the first dose of PPSV23 is recommended for people aged 19-64 years who have chronic kidney failure, nephrotic syndrome, asplenia, or immunocompromised conditions. People who received 1-2 doses of PPSV23 before age 63 years should receive another dose of PPSV23 vaccine at age 32 years or later if at least 5 years have passed since the previous dose. Doses of PPSV23 are not needed for people immunized with PPSV23 at or after age 19 years.   Preventive Services / Frequency  Ages 46 years and over  Blood pressure check.  Lipid and cholesterol check.  Lung cancer screening. / Every year if you are aged 26-80 years and have a 30-pack-year history of smoking and  currently smoke or have quit within the past 15 years. Yearly screening is stopped once you have quit smoking for at least 15 years or develop a health problem that would prevent you from having lung cancer treatment.  Clinical breast exam.** / Every year after age 32 years.   BRCA-related cancer risk assessment.** / For women who have family members with a BRCA-related cancer (breast, ovarian, tubal, or peritoneal cancers).  Mammogram.** / Every year beginning at age 26 years and continuing for as long as you are in good health. Consult with your health care provider.  Pap test.** / Every 3 years starting at age 79 years through age 33 or 66 years with 3 consecutive normal Pap tests. Testing can be stopped between 65 and 70 years with 3 consecutive normal Pap tests and no abnormal Pap or HPV tests in the past 10 years.  Fecal occult blood test (FOBT) of stool. / Every year beginning at age 4 years and continuing until age 70 years. You may not need to do this test if you get a colonoscopy every 10 years.  Flexible sigmoidoscopy or colonoscopy.** / Every 5 years for a flexible sigmoidoscopy or every 10 years for a colonoscopy beginning at age 10 years and continuing until age 67 years.  Hepatitis C blood test.** / For all people born from [birth year] through 1964 and any individual with known risks for hepatitis C.  Osteoporosis screening.** / A one-time screening for women ages 40 years and over and women at risk for fractures or osteoporosis.  Skin self-exam. / Monthly.  Influenza vaccine. / Every year.  Tetanus, diphtheria, and acellular pertussis (Tdap/Td) vaccine.** /  1 dose of Td every 10 years.  Zoster vaccine.** / 1 dose for adults aged 60 years or older.  Pneumococcal 13-valent conjugate (PCV13) vaccine.** / Consult your health care provider.  Pneumococcal polysaccharide (PPSV23) vaccine.** / 1 dose for all adults aged 65 years and older. Screening for abdominal aortic aneurysm (AAA)   by ultrasound is recommended for people who have history of high blood pressure or who are current or former smokers. ++++++++++++++++++++ Recommend Adult Low Dose Aspirin or  coated  Aspirin 81 mg daily  To reduce risk of Colon Cancer 40 %,  Skin Cancer 26 % ,  Melanoma 46%  and  Pancreatic cancer 60% ++++++++++++++++++++ Vitamin D goal  is between 70-100.  Please make sure that you are taking your Vitamin D as directed.  It is very important as a natural anti-inflammatory  helping hair, skin, and nails, as well as reducing stroke and heart attack risk.  It helps your bones and helps with mood. It also decreases numerous cancer risks so please take it as directed.  Low Vit D is associated with a 200-300% higher risk for CANCER  and 200-300% higher risk for HEART   ATTACK  &  STROKE.   ...................................... It is also associated with higher death rate at younger ages,  autoimmune diseases like Rheumatoid arthritis, Lupus, Multiple Sclerosis.    Also many other serious conditions, like depression, Alzheimer's Dementia, infertility, muscle aches, fatigue, fibromyalgia - just to name a few. ++++++++++++++++++ Recommend the book "The END of DIETING" by Dr Joel Fuhrman  & the book "The END of DIABETES " by Dr Joel Fuhrman At [employer] - get book & Audio CD's    Being diabetic has a  300% increased risk for heart attack, stroke, cancer, and alzheimer- type vascular dementia. It is very important that you work harder with diet by avoiding all foods that are white. Avoid white rice (brown & wild rice is OK), white potatoes (sweetpotatoes in moderation is OK), White bread or wheat bread or anything made out of white flour like bagels, donuts, rolls, buns, biscuits, cakes, pastries, cookies, pizza crust, and pasta (made from white flour & egg whites) - vegetarian pasta or spinach or wheat pasta is OK. Multigrain breads like Arnold's or [employer], or multigrain sandwich thins  or flatbreads.  Diet, exercise and weight loss can reverse and cure diabetes in the early stages.  Diet, exercise and weight loss is very important in the control and prevention of complications of diabetes which affects every system in your body, ie. Brain - dementia/stroke, eyes - glaucoma/blindness, heart - heart attack/heart failure, kidneys - dialysis, stomach - gastric paralysis, intestines - malabsorption, nerves - severe painful neuritis, circulation - gangrene & loss of a leg(s), and finally cancer and Alzheimers.    I recommend avoid fried & greasy foods,  sweets/candy, white rice (brown or wild rice or Quinoa is OK), white potatoes (sweet potatoes are OK) - anything made from white flour - bagels, doughnuts, rolls, buns, biscuits,white and wheat breads, pizza crust and traditional pasta made of white flour & egg white(vegetarian pasta or spinach or wheat pasta is OK).  Multi-grain bread is OK - like multi-grain flat bread or sandwich thins. Avoid alcohol in excess. Exercise is also important.    Eat all the vegetables you want - avoid meat, especially red meat and dairy - especially cheese.  Cheese is the most concentrated form of trans-fats which is the worst thing to clog up our arteries. Veggie   cheese is OK which can be found in the fresh produce section at [employer] or [employer] or [employer]  +++++++++++++++++++ DASH Eating Plan  DASH stands for "Dietary Approaches to Stop Hypertension."   The DASH eating plan is a healthy eating plan that has been shown to reduce high blood pressure (hypertension). Additional health benefits may include reducing the risk of type 2 diabetes mellitus, heart disease, and stroke. The DASH eating plan may also help with weight loss. WHAT DO I NEED TO KNOW ABOUT THE DASH EATING PLAN? For the DASH eating plan, you will follow these general guidelines:  Choose foods with a percent daily value for sodium of less than 5% (as listed on the food  label).  Use salt-free seasonings or herbs instead of table salt or sea salt.  Check with your health care provider or pharmacist before using salt substitutes.  Eat lower-sodium products, often labeled as "lower sodium" or "no salt added."  Eat fresh foods.  Eat more vegetables, fruits, and low-fat dairy products.  Choose whole grains. Look for the word "whole" as the first word in the ingredient list.  Choose fish   Limit sweets, desserts, sugars, and sugary drinks.  Choose heart-healthy fats.  Eat veggie cheese   Eat more home-cooked food and less restaurant, buffet, and fast food.  Limit fried foods.  Cook foods using methods other than frying.  Limit canned vegetables. If you do use them, rinse them well to decrease the sodium.  When eating at a restaurant, ask that your food be prepared with less salt, or no salt if possible.                      WHAT FOODS CAN I EAT? Read Dr Fara Olden Fuhrman's books on The End of Dieting & The End of Diabetes  Grains Whole grain or whole wheat bread. Brown rice. Whole grain or whole wheat pasta. Quinoa, bulgur, and whole grain cereals. Low-sodium cereals. Corn or whole wheat flour tortillas. Whole grain cornbread. Whole grain crackers. Low-sodium crackers.  Vegetables Fresh or frozen vegetables (raw, steamed, roasted, or grilled). Low-sodium or reduced-sodium tomato and vegetable juices. Low-sodium or reduced-sodium tomato sauce and paste. Low-sodium or reduced-sodium canned vegetables.   Fruits All fresh, canned (in natural juice), or frozen fruits.  Protein Products  All fish and seafood.  Dried beans, peas, or lentils. Unsalted nuts and seeds. Unsalted canned beans.  Dairy Low-fat dairy products, such as skim or 1% milk, 2% or reduced-fat cheeses, low-fat ricotta or cottage cheese, or plain low-fat yogurt. Low-sodium or reduced-sodium cheeses.  Fats and Oils Tub margarines without trans fats. Light or reduced-fat mayonnaise  and salad dressings (reduced sodium). Avocado. Safflower, olive, or canola oils. Natural peanut or almond butter.  Other Unsalted popcorn and pretzels. The items listed above may not be a complete list of recommended foods or beverages. Contact your dietitian for more options.  +++++++++++++++  WHAT FOODS ARE NOT RECOMMENDED? Grains/ White flour or wheat flour White bread. White pasta. White rice. Refined cornbread. Bagels and croissants. Crackers that contain trans fat.  Vegetables  Creamed or fried vegetables. Vegetables in a . Regular canned vegetables. Regular canned tomato sauce and paste. Regular tomato and vegetable juices.  Fruits Dried fruits. Canned fruit in light or heavy syrup. Fruit juice.  Meat and Other Protein Products Meat in general - RED meat & White meat.  Fatty cuts of meat. Ribs, chicken wings, all processed meats as bacon, sausage, bologna, salami,  fatback, hot dogs, bratwurst and packaged luncheon meats.  Dairy Whole or 2% milk, cream, half-and-half, and cream cheese. Whole-fat or sweetened yogurt. Full-fat cheeses or blue cheese. Non-dairy creamers and whipped toppings. Processed cheese, cheese spreads, or cheese curds.  Condiments Onion and garlic salt, seasoned salt, table salt, and sea salt. Canned and packaged gravies. Worcestershire sauce. Tartar sauce. Barbecue sauce. Teriyaki sauce. Soy sauce, including reduced sodium. Steak sauce. Fish sauce. Oyster sauce. Cocktail sauce. Horseradish. Ketchup and mustard. Meat flavorings and tenderizers. Bouillon cubes. Hot sauce. Tabasco sauce. Marinades. Taco seasonings. Relishes.  Fats and Oils Butter, stick margarine, lard, shortening and bacon fat. Coconut, palm kernel, or palm oils. Regular salad dressings.  Pickles and olives. Salted popcorn and pretzels.  The items listed above may not be a complete list of foods and beverages to avoid.

## 2018-11-22 LAB — LIPID PANEL
Cholesterol: 249 mg/dL — ABNORMAL HIGH (ref <200)
HDL: 64 mg/dL (ref >=50)
LDL Cholesterol (Calc): 143 mg/dL (calc) — ABNORMAL HIGH
Non-HDL Cholesterol (Calc): 185 mg/dL (calc) — ABNORMAL HIGH (ref <130)
Total CHOL/HDL Ratio: 3.9 (calc) (ref <5.0)
Triglycerides: 275 mg/dL — ABNORMAL HIGH (ref <150)

## 2018-11-22 LAB — HEMOGLOBIN A1C
Hgb A1c MFr Bld: 8.3 % of total Hgb — ABNORMAL HIGH (ref <5.7)
Mean Plasma Glucose: 192 (calc)
eAG (mmol/L): 10.6 (calc)

## 2018-11-22 LAB — TSH: TSH: 0.61 mIU/L (ref 0.40–4.50)

## 2018-11-22 LAB — MAGNESIUM: Magnesium: 2 mg/dL (ref 1.5–2.5)

## 2018-11-22 LAB — VITAMIN D 25 HYDROXY (VIT D DEFICIENCY, FRACTURES): Vit D, 25-Hydroxy: 26 ng/mL — ABNORMAL LOW (ref 30–100)

## 2018-11-23 ENCOUNTER — Encounter: Payer: Self-pay | Admitting: Internal Medicine

## 2018-11-23 ENCOUNTER — Other Ambulatory Visit: Payer: Self-pay | Admitting: Internal Medicine

## 2018-11-23 MED ORDER — GEMFIBROZIL 600 MG PO TABS: ORAL_TABLET | ORAL | 3 refills | Status: DC

## 2018-12-09 ENCOUNTER — Other Ambulatory Visit: Payer: Self-pay

## 2018-12-09 ENCOUNTER — Encounter (HOSPITAL_COMMUNITY)
Admission: RE | Admit: 2018-12-09 | Discharge: 2018-12-09 | Disposition: A | Discharge status: Home / Self Care | Payer: Medicare Other | Source: Ambulatory Visit | Attending: Internal Medicine | Admitting: Internal Medicine

## 2018-12-09 DIAGNOSIS — Z940 Kidney transplant status: Secondary | ICD-10-CM | POA: Insufficient documentation

## 2018-12-09 MED ORDER — SODIUM CHLORIDE 0.9 % IV SOLN
5.0000 mg/kg | INTRAVENOUS | Status: DC
  Administered 2018-12-09: 375 mg via INTRAVENOUS
  Filled 2018-12-09: qty 375

## 2018-12-17 ENCOUNTER — Telehealth: Payer: Self-pay | Admitting: *Deleted

## 2018-12-17 NOTE — Telephone Encounter (Signed)
Most recent office note and RX form faxed to [facility] at [phone].

## 2018-12-20 DIAGNOSIS — Z5181 Encounter for therapeutic drug level monitoring: Secondary | ICD-10-CM | POA: Diagnosis not present

## 2018-12-20 DIAGNOSIS — Z940 Kidney transplant status: Secondary | ICD-10-CM | POA: Diagnosis not present

## 2018-12-20 DIAGNOSIS — B349 Viral infection, unspecified: Secondary | ICD-10-CM | POA: Diagnosis not present

## 2018-12-20 DIAGNOSIS — B259 Cytomegaloviral disease, unspecified: Secondary | ICD-10-CM | POA: Diagnosis not present

## 2018-12-26 ENCOUNTER — Ambulatory Visit (INDEPENDENT_AMBULATORY_CARE_PROVIDER_SITE_OTHER): Payer: Medicare Other | Admitting: Adult Health Nurse Practitioner

## 2018-12-26 ENCOUNTER — Encounter: Payer: Self-pay | Admitting: Adult Health Nurse Practitioner

## 2018-12-26 ENCOUNTER — Other Ambulatory Visit: Payer: Self-pay

## 2018-12-26 VITALS — BP 120/64 | HR 62 | Temp 97.3°F | Wt 169.2 lb

## 2018-12-26 DIAGNOSIS — N186 End stage renal disease: Secondary | ICD-10-CM | POA: Diagnosis not present

## 2018-12-26 DIAGNOSIS — Z940 Kidney transplant status: Secondary | ICD-10-CM

## 2018-12-26 DIAGNOSIS — Z79899 Other long term (current) drug therapy: Secondary | ICD-10-CM

## 2018-12-26 DIAGNOSIS — H548 Legal blindness, as defined in USA: Secondary | ICD-10-CM | POA: Diagnosis not present

## 2018-12-26 DIAGNOSIS — I10 Essential (primary) hypertension: Secondary | ICD-10-CM | POA: Diagnosis not present

## 2018-12-26 DIAGNOSIS — Z6828 Body mass index (BMI) 28.0-28.9, adult: Secondary | ICD-10-CM

## 2018-12-26 DIAGNOSIS — Z992 Dependence on renal dialysis: Secondary | ICD-10-CM

## 2018-12-26 DIAGNOSIS — E1122 Type 2 diabetes mellitus with diabetic chronic kidney disease: Secondary | ICD-10-CM

## 2018-12-26 DIAGNOSIS — B0223 Postherpetic polyneuropathy: Secondary | ICD-10-CM

## 2018-12-26 MED ORDER — PREDNISONE 10 MG PO TABS: 10.0000 mg | ORAL_TABLET | Freq: Three times a day (TID) | ORAL | 0 refills | Status: AC

## 2018-12-26 MED ORDER — GABAPENTIN 100 MG PO CAPS: 100.0000 mg | ORAL_CAPSULE | Freq: Three times a day (TID) | ORAL | 2 refills | Status: DC

## 2018-12-26 MED ORDER — VALACYCLOVIR HCL 1 G PO TABS: 1000.0000 mg | ORAL_TABLET | Freq: Three times a day (TID) | ORAL | 0 refills | Status: DC

## 2018-12-26 NOTE — Patient Instructions (Addendum)
We are sending valtrex to take three times a day for 7 days.  We are also sending in prednisone, take 10mg  three times a day for 5 days.  Take this with food.  Gapentin 100mg  take one tablet up to three times a day as needed for severe nerve pain.  This medication may make you sleepy.    Consider getting the Shingrix vaccination.  Look into this three months after you have completely healed.     Shingles  Shingles is an infection. It gives you a painful skin rash and blisters that have fluid in them. Shingles is caused by the same germ (virus) that causes chickenpox. Shingles only happens in people who:  Have had chickenpox.  Have been given a shot of medicine (vaccine) to protect against chickenpox. Shingles is rare in this group. The first symptoms of shingles may be itching, tingling, or pain in an area on your skin. A rash will show on your skin a few days or weeks later. The rash is likely to be on one side of your body. The rash usually has a shape like a belt or a band. Over time, the rash turns into fluid-filled blisters. The blisters will break open, change into scabs, and dry up. Medicines may:  Help with pain and itching.  Help you get better sooner.  Help to prevent long-term problems. Follow these instructions at home: Medicines  Take over-the-counter and prescription medicines only as told by your doctor.  Put on an anti-itch cream or numbing cream where you have a rash, blisters, or scabs. Do this as told by your doctor. Helping with itching and discomfort   Put cold, wet cloths (cold compresses) on the area of the rash or blisters as told by your doctor.  Cool baths can help you feel better. Try adding baking soda or dry oatmeal to the water to lessen itching. Do not bathe in hot water. Blister and rash care  Keep your rash covered with a loose bandage (dressing).  Wear loose clothing that does not rub on your rash.  Keep your rash and blisters  clean. To do this, wash the area with mild soap and cool water as told by your doctor.  Check your rash every day for signs of infection. Check for: ? More redness, swelling, or pain. ? Fluid or blood. ? Warmth. ? Pus or a bad smell.  Do not scratch your rash. Do not pick at your blisters. To help you to not scratch: ? Keep your fingernails clean and cut short. ? Wear gloves or mittens when you sleep, if scratching is a problem. General instructions  Rest as told by your doctor.  Keep all follow-up visits as told by your doctor. This is important.  Wash your hands often with soap and water. If soap and water are not available, use hand sanitizer. Doing this lowers your chance of getting a skin infection caused by germs (bacteria).  Your infection can cause chickenpox in people who have never had chickenpox or never got a shot of chickenpox vaccine. If you have blisters that did not change into scabs yet, try not to touch other people or be around other people, especially: ? Babies. ? Pregnant women. ? Children who have areas of red, itchy, or rough skin (eczema). ? Very old people who have transplants. ? People who have a long-term (chronic) sickness, like cancer or AIDS. Contact a doctor if:  Your pain does not get better with medicine.  Your pain does not get better after the rash heals.  You have any signs of infection in the rash area. These signs include: ? More redness, swelling, or pain around the rash. ? Fluid or blood coming from the rash. ? The rash area feeling warm to the touch. ? Pus or a bad smell coming from the rash. Get help right away if:  The rash is on your face or nose.  You have pain in your face or pain by your eye.  You lose feeling on one side of your face.  You have trouble seeing.  You have ear pain, or you have ringing in your ear.  You have a loss of taste.  Your condition gets worse. Summary  Shingles gives you a painful skin rash and  blisters that have fluid in them.  Shingles is an infection. It is caused by the same germ (virus) that causes chickenpox.  Keep your rash covered with a loose bandage (dressing). Wear loose clothing that does not rub on your rash.  If you have blisters that did not change into scabs yet, try not to touch other people or be around people. This information is not intended to replace advice given to you by your health care provider. Make sure you discuss any questions you have with your health care provider. Document Released: 10/04/2007 Document Revised: 08/09/2018 Document Reviewed: 12/20/2016 Elsevier Patient Education  2020 [employer].

## 2018-12-26 NOTE — Progress Notes (Signed)
Assessment and Plan:  Matalyn was seen today for acute visit.  Diagnoses and all orders for this visit:  Shingles (herpes zoster) polyneuropathy -     valACYclovir (VALTREX) 1000 MG tablet; Take 1 tablet (1,000 mg total) by mouth 3 (three) times daily. Take for 7 days -     predniSONE (DELTASONE) 10 MG tablet; Take 1 tablet (10 mg total) by mouth 3 (three) times daily for 5 days. Take medication with food. -     gabapentin (NEURONTIN) 100 MG capsule; Take 1 capsule (100 mg total) by mouth 3 (three) times daily. As needed for nerve pain. Discussed medications and side effectss Discussed shingrix vaccination, three months after symptoms resolve.  Printed education provided.  Essential hypertension Controlled today Monitor blood pressure at home; call if consistently over 130/80 Continue DASH diet.   Reminder to go to the ER if any CP, SOB, nausea, dizziness, severe HA, changes vision/speech, left arm numbness and tingling and jaw pain.   End stage renal disease on dialysis due to type 2 diabetes mellitus ([place]) Monitoring glucose TID and PRN Continue to monitor especially with prednisone, patient does not want to take prednisone, will monitor symptoms.  Kidney replaced by transplant Continued monitoring follow with Dr Annamaria Boots at [facility]  BMI 28.0-28.9,adult Discussed dietary and exercise modifications  Legally blind Discussed safety Sleeping well at this time Using cane and husband transport patient  Medication management Continued    Further disposition pending results of labs. Discussed med's effects and SE's.   Over 30 minutes of exam, counseling, chart review, and critical decision making was performed.   Future Appointments  Date Time Provider [place]  12/26/2018  9:00 AM Garnet Sierras, NP [facility] None  01/07/2019  9:00 AM [facility] ROOM 11 [facility] None  02/04/2019 11:00 AM [facility] DX DEXA 1 [facility] CE  02/25/2019  9:00 AM Garnet Sierras, NP [facility]  None  02/26/2019  9:00 AM Gardiner Barefoot, DPM [facility]  05/29/2019 11:15 AM Unk Pinto, MD [facility] None  10/07/2019 10:00 AM Garnet Sierras, NP [facility] None  12/04/2019  2:00 PM Unk Pinto, MD [facility] None    ------------------------------------------------------------------------------------------------------------------   HPI 68 y.o.female presents for evaluation of bites/marks on her back that have been there for one week.  She reports it started on her left side around her ribs.  Then four days ago a second spot appeared in a cluster on the right side of her back.  She reports that it is very painful.  She denies any itching.  She has been putting neosporin on the area which has seemed to help some.  She has not taken any over the counter medications.  She is accompanied by her husband today.  She is visually impaired and ambulates with a cane.  She also has history of HTN, HLD, DMII, and follows at [facility] for kidney transplant and is receiving dialysis.         Past Medical History:  Diagnosis Date  . Allergy   . Blood transfusion without reported diagnosis   . Cataract    bilateral  . Diabetes mellitus without complication ([place])   . Diabetic retinopathy ([place])   . ESRD (end stage renal disease) ([place])   . Hashimoto's disease   . Hyperlipidemia   . Hypertension   . Hypothyroid   . Loss of vision    both eyes, no vision left eye and little in left      Allergies  Allergen Reactions  . Hydralazine Shortness Of  Breath    Chest pain fatigue Chest pain  . Azor [Amlodipine-Olmesartan] Other (See Comments)    Sharp chest pain  Sharp chest pain  Chest pain  . Hydrochlorothiazide W-Triamterene   . Labetalol     Per patient, caused foot pain  . Lisinopril Other (See Comments)    Severe radiating foot pain.  . Metformin Itching    Loss of bowel control, general sick feeling  . Metoclopramide Other (See Comments)  . Other Other (See Comments)     "contractions in throat" Diazide pt states cause throat to swell.  . Minoxidil Rash    Choking, neck contractions fatigue  . Spironolactone Other (See Comments) and Rash    Severe radiating pain in feet Severe radiating pain in feet.  . Statins Rash    Fatigue Cause fatigue    Current Outpatient Medications on File Prior to Visit  Medication Sig  . amLODipine (NORVASC) 2.5 MG tablet Take 5 mg by mouth daily.  Marland Kitchen aspirin EC 81 MG tablet Take 81 mg by mouth daily.  . belatacept (NULOJIX) 250 MG SOLR injection Inject into the vein.  . Blood Glucose Monitoring Suppl (FIFTY50 GLUCOSE METER 2.0) w/Device KIT Use as directed Product selection permitted according to insurance preference. E11.9 Type 2 diabetes mellitus  . Blood Glucose Monitoring Suppl (RELION PREMIER VOICE MONITOR) DEVI See admin instructions.  . carvedilol (COREG) 6.25 MG tablet Take 6.25 mg by mouth 2 (two) times daily with a meal.  . Continuous Blood Gluc Receiver (FREESTYLE LIBRE 14 DAY READER) DEVI Use 1 Application as directed  . Continuous Blood Gluc Sensor (FREESTYLE LIBRE 14 DAY SENSOR) MISC Use 1 Device every 14 (fourteen) days  . ezetimibe (ZETIA) 10 MG tablet Take 1 tablet (10 mg total) by mouth daily.  Marland Kitchen gemfibrozil (LOPID) 600 MG tablet Take 1 tablet 2 x  /day with Meals for Cholesterol & Triglycerides  . glucose blood (PRODIGY NO CODING BLOOD GLUC) test strip 1 each.  . insulin glargine (LANTUS) 100 UNIT/ML injection Inject 8 Units into the skin daily.  . insulin lispro (HUMALOG) 100 UNIT/ML KwikPen Take 5 units breakfast and dinner, 6 units with lunch + sliding scale.  . Insulin Pen Needle (FIFTY50 PEN NEEDLES) 31G X 5 MM MISC Test sugars Three to four times daily  . Lancets Misc. (UNISTIK 2 NORMAL) MISC Use 1 each 4 (four) times daily Product selection permitted according to insurance preference. E11.9 Type 2 diabetes mellitus  . levothyroxine (SYNTHROID) 150 MCG tablet Take 152mg daily except for Wed and Sun   . linagliptin (TRADJENTA) 5 MG TABS tablet Take by mouth.  . sirolimus (RAPAMUNE) 1 MG tablet Take 1 mg by mouth daily.  . Sulfamethoxazole-Trimethoprim (BACTRIM PO) Take by mouth.  .Marland KitchenUNABLE TO FIND Use 1 Application as directed  . UNABLE TO FIND Use 1 Device every 14 (fourteen) days   No current facility-administered medications on file prior to visit.     ROS: all negative except above.   Physical Exam:   General Appearance: Well nourished, in no apparent distress.  She is visually impaired. Eyes: PERRLA, EOMs, conjunctiva no swelling or erythema Sinuses: No Frontal/maxillary tenderness ENT/Mouth: Ext aud canals clear, TMs without erythema, bulging. No erythema, swelling, or exudate on post pharynx.  Tonsils not swollen or erythematous. Hearing normal.  Neck: Supple, thyroid normal.  Respiratory: Respiratory effort normal, BS equal bilaterally without rales, rhonchi, wheezing or stridor.  Cardio: RRR with no MRGs. Brisk peripheral pulses without edema.  Abdomen: Soft, +  BS.  Non tender, no guarding, rebound, hernias, masses. Lymphatics: Non tender without lymphadenopathy.  Musculoskeletal: Full ROM, 5/5 strength, antalgic gait.  Skin: Warm, dry.  Raised vesicle lesions in patch distribution on right T2-T3 dermatome. Neuro: Cranial nerves intact. Normal muscle tone, no cerebellar symptoms. Sensation intact. Hypersensitivity to T2-T3. Psych: Awake and oriented X 3, normal affect, Insight and Judgment appropriate.     Garnet Sierras, NP 8:31 AM [facility]

## 2018-12-31 ENCOUNTER — Telehealth: Payer: Self-pay | Admitting: *Deleted

## 2018-12-31 NOTE — Telephone Encounter (Signed)
Called representative at diabetic supply company regarding the [employer] supplies. The supply company states the OV note is not detailed enough, in regard to the patient's testing routine. Since the patient's diabetes is managed by her endocrinologist at [facility], it was suggested they contact [facility] in regard to the RX and notes. The patient was called and informed of the situation and she will contact the endocrinologist in regard to the supplies.

## 2019-01-03 ENCOUNTER — Other Ambulatory Visit (HOSPITAL_COMMUNITY): Payer: Self-pay

## 2019-01-03 DIAGNOSIS — Z5181 Encounter for therapeutic drug level monitoring: Secondary | ICD-10-CM | POA: Diagnosis not present

## 2019-01-03 DIAGNOSIS — B259 Cytomegaloviral disease, unspecified: Secondary | ICD-10-CM | POA: Diagnosis not present

## 2019-01-03 DIAGNOSIS — Z940 Kidney transplant status: Secondary | ICD-10-CM | POA: Diagnosis not present

## 2019-01-03 DIAGNOSIS — B349 Viral infection, unspecified: Secondary | ICD-10-CM | POA: Diagnosis not present

## 2019-01-07 ENCOUNTER — Other Ambulatory Visit: Payer: Self-pay

## 2019-01-07 ENCOUNTER — Ambulatory Visit (HOSPITAL_COMMUNITY)
Admission: RE | Admit: 2019-01-07 | Discharge: 2019-01-07 | Disposition: A | Discharge status: Home / Self Care | Payer: Medicare Other | Source: Ambulatory Visit | Attending: Internal Medicine | Admitting: Internal Medicine

## 2019-01-07 DIAGNOSIS — Z940 Kidney transplant status: Secondary | ICD-10-CM | POA: Diagnosis not present

## 2019-01-07 MED ORDER — SODIUM CHLORIDE 0.9 % IV SOLN
5.0000 mg/kg | INTRAVENOUS | Status: DC
  Administered 2019-01-07: 375 mg via INTRAVENOUS
  Filled 2019-01-07: qty 375

## 2019-01-07 NOTE — Progress Notes (Signed)
Pt here for her Belatacept. States that she has the shingles with a few open sores still on back but mostly crusted over.  States was started on Neuronton 9 days ago and finished her 7 day course 2 days ago. Dr Lissa Merlin called and he states she still needs to receive her Belatacept and he will follow up on her care for the shingles.

## 2019-01-08 ENCOUNTER — Other Ambulatory Visit: Payer: Self-pay

## 2019-01-08 ENCOUNTER — Ambulatory Visit (INDEPENDENT_AMBULATORY_CARE_PROVIDER_SITE_OTHER): Payer: Medicare Other

## 2019-01-08 VITALS — Temp 97.4°F

## 2019-01-08 DIAGNOSIS — Z23 Encounter for immunization: Secondary | ICD-10-CM | POA: Diagnosis not present

## 2019-01-08 NOTE — Progress Notes (Signed)
Patient presents to the office for HD Flu Vaccine. Vaccine administered to Right Deltoid without any complication. Temperature taken and recorded.

## 2019-01-24 DIAGNOSIS — B259 Cytomegaloviral disease, unspecified: Secondary | ICD-10-CM | POA: Diagnosis not present

## 2019-01-24 DIAGNOSIS — B349 Viral infection, unspecified: Secondary | ICD-10-CM | POA: Diagnosis not present

## 2019-01-24 DIAGNOSIS — Z940 Kidney transplant status: Secondary | ICD-10-CM | POA: Diagnosis not present

## 2019-01-24 DIAGNOSIS — Z5181 Encounter for therapeutic drug level monitoring: Secondary | ICD-10-CM | POA: Diagnosis not present

## 2019-01-29 DIAGNOSIS — E039 Hypothyroidism, unspecified: Secondary | ICD-10-CM | POA: Diagnosis not present

## 2019-01-29 DIAGNOSIS — E119 Type 2 diabetes mellitus without complications: Secondary | ICD-10-CM | POA: Diagnosis not present

## 2019-01-31 ENCOUNTER — Other Ambulatory Visit: Payer: Self-pay | Admitting: Internal Medicine

## 2019-01-31 DIAGNOSIS — E785 Hyperlipidemia, unspecified: Secondary | ICD-10-CM | POA: Diagnosis not present

## 2019-01-31 DIAGNOSIS — E2839 Other primary ovarian failure: Secondary | ICD-10-CM

## 2019-01-31 DIAGNOSIS — Z4822 Encounter for aftercare following kidney transplant: Secondary | ICD-10-CM | POA: Diagnosis not present

## 2019-01-31 DIAGNOSIS — E119 Type 2 diabetes mellitus without complications: Secondary | ICD-10-CM | POA: Diagnosis not present

## 2019-01-31 DIAGNOSIS — Z79899 Other long term (current) drug therapy: Secondary | ICD-10-CM | POA: Diagnosis not present

## 2019-01-31 DIAGNOSIS — Z8619 Personal history of other infectious and parasitic diseases: Secondary | ICD-10-CM | POA: Diagnosis not present

## 2019-01-31 DIAGNOSIS — I10 Essential (primary) hypertension: Secondary | ICD-10-CM | POA: Diagnosis not present

## 2019-01-31 DIAGNOSIS — B349 Viral infection, unspecified: Secondary | ICD-10-CM | POA: Diagnosis not present

## 2019-01-31 DIAGNOSIS — N951 Menopausal and female climacteric states: Secondary | ICD-10-CM

## 2019-01-31 DIAGNOSIS — H548 Legal blindness, as defined in USA: Secondary | ICD-10-CM | POA: Diagnosis not present

## 2019-02-04 ENCOUNTER — Other Ambulatory Visit: Payer: Self-pay

## 2019-02-04 ENCOUNTER — Ambulatory Visit
Admission: RE | Admit: 2019-02-04 | Discharge: 2019-02-04 | Disposition: A | Discharge status: Home / Self Care | Payer: Medicare Other | Source: Ambulatory Visit | Attending: Internal Medicine | Admitting: Internal Medicine

## 2019-02-04 ENCOUNTER — Encounter (HOSPITAL_COMMUNITY)
Admission: RE | Admit: 2019-02-04 | Discharge: 2019-02-04 | Disposition: A | Discharge status: Home / Self Care | Payer: Medicare Other | Source: Ambulatory Visit | Attending: Internal Medicine | Admitting: Internal Medicine

## 2019-02-04 DIAGNOSIS — Z780 Asymptomatic menopausal state: Secondary | ICD-10-CM | POA: Diagnosis not present

## 2019-02-04 DIAGNOSIS — E2839 Other primary ovarian failure: Secondary | ICD-10-CM

## 2019-02-04 DIAGNOSIS — Z940 Kidney transplant status: Secondary | ICD-10-CM | POA: Insufficient documentation

## 2019-02-04 DIAGNOSIS — Z13820 Encounter for screening for osteoporosis: Secondary | ICD-10-CM | POA: Diagnosis not present

## 2019-02-04 DIAGNOSIS — N951 Menopausal and female climacteric states: Secondary | ICD-10-CM

## 2019-02-04 MED ORDER — SODIUM CHLORIDE 0.9 % IV SOLN
5.0000 mg/kg | INTRAVENOUS | Status: DC
  Administered 2019-02-04: 375 mg via INTRAVENOUS
  Filled 2019-02-04: qty 375

## 2019-02-06 ENCOUNTER — Telehealth: Payer: Self-pay | Admitting: *Deleted

## 2019-02-06 NOTE — Progress Notes (Signed)
02/06/2019--patient is aware of bone density results. -e welch

## 2019-02-06 NOTE — Telephone Encounter (Signed)
Not at home.

## 2019-02-17 ENCOUNTER — Other Ambulatory Visit: Payer: Self-pay | Admitting: *Deleted

## 2019-02-17 MED ORDER — FREESTYLE LIBRE 14 DAY SENSOR MISC: 1.0000 | 1 refills | Status: DC

## 2019-02-23 NOTE — Patient Instructions (Addendum)
You are due for the Shingrix vaccination in early December.  Call [facility] or [facility] to ask about getting the Shingles (Shingrix) vaccination.  There could be a waiting list for this.   We will forward your A1c results to Endocrinology.      Vit D  & Vit C 1,000 mg   are recommended to help protect  against the Covid-19 and other Corona viruses.    Also it's recommended  to take  Zinc 50 mg  to help  protect against the Covid-19   and best place to get  is also on [email or web address]  and don't pay more than 6-8 cents /pill !  ================================ Coronavirus (COVID-19) Are you at risk?  Are you at risk for the Coronavirus (COVID-19)?  To be considered HIGH RISK for Coronavirus (COVID-19), you have to meet the following criteria:  . Traveled to [place], [place], [place], [place] or [place]; or in the [place] to [place], [place], [place]  . or [place]; and have fever, cough, and shortness of breath within the last 2 weeks of travel OR . Been in close contact with a person diagnosed with COVID-19 within the last 2 weeks and have  . fever, cough,and shortness of breath .  . IF YOU DO NOT MEET THESE CRITERIA, YOU ARE CONSIDERED LOW RISK FOR COVID-19.  What to do if you are HIGH RISK for COVID-19?  Marland Kitchen If you are having a medical emergency, call 911. . Seek medical care right away. Before you go to a doctor's office, urgent care or emergency department, .  call ahead and tell them about your recent travel, contact with someone diagnosed with COVID-19  .  and your symptoms.  . You should receive instructions from your physician's office regarding next steps of care.  . When you arrive at healthcare provider, tell the healthcare staff immediately you have returned from  . visiting [place], [place], [place], [place] or [place]; or traveled in the [place] to [place], [place],  . [place] or [place] in the last two weeks or you have been in close contact  with a person diagnosed with  . COVID-19 in the last 2 weeks.   . Tell the health care staff about your symptoms: fever, cough and shortness of breath. . After you have been seen by a medical provider, you will be either: o Tested for (COVID-19) and discharged home on quarantine except to seek medical care if  o symptoms worsen, and asked to  - Stay home and avoid contact with others until you get your results (4-5 days)  - Avoid travel on public transportation if possible (such as bus, train, or airplane) or o Sent to the Emergency Department by EMS for evaluation, COVID-19 testing  and  o possible admission depending on your condition and test results.  What to do if you are LOW RISK for COVID-19?  Reduce your risk of any infection by using the same precautions used for avoiding the common cold or flu:  Marland Kitchen Wash your hands often with soap and warm water for at least 20 seconds.  If soap and water are not readily available,  . use an alcohol-based hand sanitizer with at least 60% alcohol.  . If coughing or sneezing, cover your mouth and nose by coughing or sneezing into the elbow areas of your shirt or coat, .  into a tissue or into your sleeve (not your hands). . Avoid shaking hands with others  and consider head nods or verbal greetings only. . Avoid touching your eyes, nose, or mouth with unwashed hands.  . Avoid close contact with people who are sick. . Avoid places or events with large numbers of people in one location, like concerts or sporting events. . Carefully consider travel plans you have or are making. . If you are planning any travel outside or inside the [place], visit the CDC's Travelers' Health webpage for the latest health notices. . If you have some symptoms but not all symptoms, continue to monitor at home and seek medical attention  . if your symptoms worsen. . If you are having a medical emergency, call 911.   . >>>>>>>>>>>>>>>>>>>>>>>>>>>>>>>>> . We Do NOT Approve of   [facility], [employer]  Our Patients  To Do Home Visits & We Do NOT Approve of [employer] > > > > > > > > > > > > > > > > > > > > > > > > > > > > > > > > > > > > > > >  Preventive Care for Adults  A healthy lifestyle and preventive care can promote health and wellness. Preventive health guidelines for women include the following key practices.  A routine yearly physical is a good way to check with your health care provider about your health and preventive screening. It is a chance to share any concerns and updates on your health and to receive a thorough exam.  Visit your dentist for a routine exam and preventive care every 6 months. Brush your teeth twice a day and floss once a day. Good oral hygiene prevents tooth decay and gum disease.  The frequency of eye exams is based on your age, health, family medical history, use of contact lenses, and other factors. Follow your health care provider's recommendations for frequency of eye exams.  Eat a healthy diet. Foods like vegetables, fruits, whole grains, low-fat dairy products, and lean protein foods contain the nutrients you need without too many calories. Decrease your intake of foods high in solid fats, added sugars, and salt. Eat the right amount of calories for you. Get information about a proper diet from your health care provider, if necessary.  Regular physical exercise is one of the most important things you can do for your health. Most adults should get at least 150 minutes of moderate-intensity exercise (any activity that increases your heart rate and causes you to sweat) each week. In addition, most adults need muscle-strengthening exercises on 2 or more days a week.  Maintain a healthy weight. The body mass index (BMI) is a screening tool to identify possible weight problems. It provides an estimate of body fat based on height and weight. Your health care provider can find your BMI and can help you achieve  or maintain a healthy weight. For adults 20 years and older:  A BMI below 18.5 is considered underweight.  A BMI of 18.5 to 24.9 is normal.  A BMI of 25 to 29.9 is considered overweight.  A BMI of 30 and above is considered obese.  Maintain normal blood lipids and cholesterol levels by exercising and minimizing your intake of saturated fat. Eat a balanced diet with plenty of fruit and vegetables. If your lipid or cholesterol levels are high, you are over 50, or you are at high risk for heart disease, you may need your cholesterol levels checked more frequently. Ongoing high lipid and cholesterol levels should be treated with medicines if diet  and exercise are not working.  If you smoke, find out from your health care provider how to quit. If you do not use tobacco, do not start.  Lung cancer screening is recommended for adults aged 31-80 years who are at high risk for developing lung cancer because of a history of smoking. A yearly low-dose CT scan of the lungs is recommended for people who have at least a 30-pack-year history of smoking and are a current smoker or have quit within the past 15 years. A pack year of smoking is smoking an average of 1 pack of cigarettes a day for 1 year (for example: 1 pack a day for 30 years or 2 packs a day for 15 years). Yearly screening should continue until the smoker has stopped smoking for at least 15 years. Yearly screening should be stopped for people who develop a health problem that would prevent them from having lung cancer treatment.  Avoid use of street drugs. Do not share needles with anyone. Ask for help if you need support or instructions about stopping the use of drugs.  High blood pressure causes heart disease and increases the risk of stroke.  Ongoing high blood pressure should be treated with medicines if weight loss and exercise do not work.  If you are 78-12 years old, ask your health care provider if you should take aspirin to prevent  strokes.  Diabetes screening involves taking a blood sample to check your fasting blood sugar level. This should be done once every 3 years, after age 76, if you are within normal weight and without risk factors for diabetes. Testing should be considered at a younger age or be carried out more frequently if you are overweight and have at least 1 risk factor for diabetes.  Breast cancer screening is essential preventive care for women. You should practice "breast self-awareness." This means understanding the normal appearance and feel of your breasts and may include breast self-examination. Any changes detected, no matter how small, should be reported to a health care provider. Women in their 79s and 30s should have a clinical breast exam (CBE) by a health care provider as part of a regular health exam every 1 to 3 years. After age 39, women should have a CBE every year. Starting at age 66, women should consider having a mammogram (breast X-ray test) every year. Women who have a family history of breast cancer should talk to their health care provider about genetic screening. Women at a high risk of breast cancer should talk to their health care providers about having an MRI and a mammogram every year.  Breast cancer gene (BRCA)-related cancer risk assessment is recommended for women who have family members with BRCA-related cancers. BRCA-related cancers include breast, ovarian, tubal, and peritoneal cancers. Having family members with these cancers may be associated with an increased risk for harmful changes (mutations) in the breast cancer genes BRCA1 and BRCA2. Results of the assessment will determine the need for genetic counseling and BRCA1 and BRCA2 testing.  Routine pelvic exams to screen for cancer are no longer recommended for nonpregnant women who are considered low risk for cancer of the pelvic organs (ovaries, uterus, and vagina) and who do not have symptoms. Ask your health care provider if a  screening pelvic exam is right for you.  If you have had past treatment for cervical cancer or a condition that could lead to cancer, you need Pap tests and screening for cancer for at least 20 years after  your treatment. If Pap tests have been discontinued, your risk factors (such as having a new sexual partner) need to be reassessed to determine if screening should be resumed. Some women have medical problems that increase the chance of getting cervical cancer. In these cases, your health care provider may recommend more frequent screening and Pap tests.    Colorectal cancer can be detected and often prevented. Most routine colorectal cancer screening begins at the age of 20 years and continues through age 37 years. However, your health care provider may recommend screening at an earlier age if you have risk factors for colon cancer. On a yearly basis, your health care provider may provide home test kits to check for hidden blood in the stool. Use of a small camera at the end of a tube, to directly examine the colon (sigmoidoscopy or colonoscopy), can detect the earliest forms of colorectal cancer. Talk to your health care provider about this at age 19, when routine screening begins.  Direct exam of the colon should be repeated every 5-10 years through age 12 years, unless early forms of pre-cancerous polyps or small growths are found.  Osteoporosis is a disease in which the bones lose minerals and strength with aging. This can result in serious bone fractures or breaks. The risk of osteoporosis can be identified using a bone density scan. Women ages 79 years and over and women at risk for fractures or osteoporosis should discuss screening with their health care providers. Ask your health care provider whether you should take a calcium supplement or vitamin D to reduce the rate of osteoporosis.  Menopause can be associated with physical symptoms and risks. Hormone replacement therapy is available to  decrease symptoms and risks. You should talk to your health care provider about whether hormone replacement therapy is right for you.  Use sunscreen. Apply sunscreen liberally and repeatedly throughout the day. You should seek shade when your shadow is shorter than you. Protect yourself by wearing long sleeves, pants, a wide-brimmed hat, and sunglasses year round, whenever you are outdoors.  Once a month, do a whole body skin exam, using a mirror to look at the skin on your back. Tell your health care provider of new moles, moles that have irregular borders, moles that are larger than a pencil eraser, or moles that have changed in shape or color.  Stay current with required vaccines (immunizations).  Influenza vaccine. All adults should be immunized every year.  Tetanus, diphtheria, and acellular pertussis (Td, Tdap) vaccine. Pregnant women should receive 1 dose of Tdap vaccine during each pregnancy. The dose should be obtained regardless of the length of time since the last dose. Immunization is preferred during the 27th-36th week of gestation. An adult who has not previously received Tdap or who does not know her vaccine status should receive 1 dose of Tdap. This initial dose should be followed by tetanus and diphtheria toxoids (Td) booster doses every 10 years. Adults with an unknown or incomplete history of completing a 3-dose immunization series with Td-containing vaccines should begin or complete a primary immunization series including a Tdap dose. Adults should receive a Td booster every 10 years.    Zoster vaccine. One dose is recommended for adults aged 72 years or older unless certain conditions are present.    Pneumococcal 13-valent conjugate (PCV13) vaccine. When indicated, a person who is uncertain of her immunization history and has no record of immunization should receive the PCV13 vaccine. An adult aged 49 years or older  who has certain medical conditions and has not been previously  immunized should receive 1 dose of PCV13 vaccine. This PCV13 should be followed with a dose of pneumococcal polysaccharide (PPSV23) vaccine. The PPSV23 vaccine dose should be obtained at least 1 or more year(s) after the dose of PCV13 vaccine. An adult aged 41 years or older who has certain medical conditions and previously received 1 or more doses of PPSV23 vaccine should receive 1 dose of PCV13. The PCV13 vaccine dose should be obtained 1 or more years after the last PPSV23 vaccine dose.    Pneumococcal polysaccharide (PPSV23) vaccine. When PCV13 is also indicated, PCV13 should be obtained first. All adults aged 36 years and older should be immunized. An adult younger than age 37 years who has certain medical conditions should be immunized. Any person who resides in a nursing home or long-term care facility should be immunized. An adult smoker should be immunized. People with an immunocompromised condition and certain other conditions should receive both PCV13 and PPSV23 vaccines. People with human immunodeficiency virus (HIV) infection should be immunized as soon as possible after diagnosis. Immunization during chemotherapy or radiation therapy should be avoided. Routine use of PPSV23 vaccine is not recommended for American Indians, [place] Natives, or people younger than 65 years unless there are medical conditions that require PPSV23 vaccine. When indicated, people who have unknown immunization and have no record of immunization should receive PPSV23 vaccine. One-time revaccination 5 years after the first dose of PPSV23 is recommended for people aged 19-64 years who have chronic kidney failure, nephrotic syndrome, asplenia, or immunocompromised conditions. People who received 1-2 doses of PPSV23 before age 45 years should receive another dose of PPSV23 vaccine at age 3 years or later if at least 5 years have passed since the previous dose. Doses of PPSV23 are not needed for people immunized with PPSV23 at  or after age 52 years.   Preventive Services / Frequency  Ages 27 years and over  Blood pressure check.  Lipid and cholesterol check.  Lung cancer screening. / Every year if you are aged 30-80 years and have a 30-pack-year history of smoking and currently smoke or have quit within the past 15 years. Yearly screening is stopped once you have quit smoking for at least 15 years or develop a health problem that would prevent you from having lung cancer treatment.  Clinical breast exam.** / Every year after age 103 years.   BRCA-related cancer risk assessment.** / For women who have family members with a BRCA-related cancer (breast, ovarian, tubal, or peritoneal cancers).  Mammogram.** / Every year beginning at age 71 years and continuing for as long as you are in good health. Consult with your health care provider.  Pap test.** / Every 3 years starting at age 24 years through age 30 or 67 years with 3 consecutive normal Pap tests. Testing can be stopped between 65 and 70 years with 3 consecutive normal Pap tests and no abnormal Pap or HPV tests in the past 10 years.  Fecal occult blood test (FOBT) of stool. / Every year beginning at age 81 years and continuing until age 67 years. You may not need to do this test if you get a colonoscopy every 10 years.  Flexible sigmoidoscopy or colonoscopy.** / Every 5 years for a flexible sigmoidoscopy or every 10 years for a colonoscopy beginning at age 35 years and continuing until age 58 years.  Hepatitis C blood test.** / For all people born from [birth year] through  1964 and any individual with known risks for hepatitis C.  Osteoporosis screening.** / A one-time screening for women ages 85 years and over and women at risk for fractures or osteoporosis.  Skin self-exam. / Monthly.  Influenza vaccine. / Every year.  Tetanus, diphtheria, and acellular pertussis (Tdap/Td) vaccine.** / 1 dose of Td every 10 years.  Zoster vaccine.** / 1 dose for adults aged  34 years or older.  Pneumococcal 13-valent conjugate (PCV13) vaccine.** / Consult your health care provider.  Pneumococcal polysaccharide (PPSV23) vaccine.** / 1 dose for all adults aged 30 years and older. Screening for abdominal aortic aneurysm (AAA)  by ultrasound is recommended for people who have history of high blood pressure or who are current or former smokers. ++++++++++++++++++++ Recommend Adult Low Dose Aspirin or  coated  Aspirin 81 mg daily  To reduce risk of Colon Cancer 40 %,  Skin Cancer 26 % ,  Melanoma 46%  and  Pancreatic cancer 60% ++++++++++++++++++++ Vitamin D goal  is between 70-100.  Please make sure that you are taking your Vitamin D as directed.  It is very important as a natural anti-inflammatory  helping hair, skin, and nails, as well as reducing stroke and heart attack risk.  It helps your bones and helps with mood. It also decreases numerous cancer risks so please take it as directed.  Low Vit D is associated with a 200-300% higher risk for CANCER  and 200-300% higher risk for HEART   ATTACK  &  STROKE.   .....................................Marland Kitchen It is also associated with higher death rate at younger ages,  autoimmune diseases like Rheumatoid arthritis, Lupus, Multiple Sclerosis.    Also many other serious conditions, like depression, Alzheimer's Dementia, infertility, muscle aches, fatigue, fibromyalgia - just to name a few. ++++++++++++++++++ Recommend the book "The END of DIETING" by Dr Excell Seltzer  & the book "The END of DIABETES " by Dr Excell Seltzer At [email or web address] - get book & Audio CD's    Being diabetic has a  300% increased risk for heart attack, stroke, cancer, and alzheimer- type vascular dementia. It is very important that you work harder with diet by avoiding all foods that are white. Avoid white rice (brown & wild rice is OK), white potatoes (sweetpotatoes in moderation is OK), White bread or wheat bread or anything made out of white flour  like bagels, donuts, rolls, buns, biscuits, cakes, pastries, cookies, pizza crust, and pasta (made from white flour & egg whites) - vegetarian pasta or spinach or wheat pasta is OK. Multigrain breads like Arnold's or [employer], or multigrain sandwich thins or flatbreads.  Diet, exercise and weight loss can reverse and cure diabetes in the early stages.  Diet, exercise and weight loss is very important in the control and prevention of complications of diabetes which affects every system in your body, ie. Brain - dementia/stroke, eyes - glaucoma/blindness, heart - heart attack/heart failure, kidneys - dialysis, stomach - gastric paralysis, intestines - malabsorption, nerves - severe painful neuritis, circulation - gangrene & loss of a leg(s), and finally cancer and Alzheimers.    I recommend avoid fried & greasy foods,  sweets/candy, white rice (brown or wild rice or Quinoa is OK), white potatoes (sweet potatoes are OK) - anything made from white flour - bagels, doughnuts, rolls, buns, biscuits,white and wheat breads, pizza crust and traditional pasta made of white flour & egg white(vegetarian pasta or spinach or wheat pasta is OK).  Multi-grain bread is OK - like multi-grain flat  bread or sandwich thins. Avoid alcohol in excess. Exercise is also important.    Eat all the vegetables you want - avoid meat, especially red meat and dairy - especially cheese.  Cheese is the most concentrated form of trans-fats which is the worst thing to clog up our arteries. Veggie cheese is OK which can be found in the fresh produce section at [employer] or [employer] or [employer]  +++++++++++++++++++ DASH Eating Plan  DASH stands for "Dietary Approaches to Stop Hypertension."   The DASH eating plan is a healthy eating plan that has been shown to reduce high blood pressure (hypertension). Additional health benefits may include reducing the risk of type 2 diabetes mellitus, heart disease, and stroke. The DASH eating  plan may also help with weight loss. WHAT DO I NEED TO KNOW ABOUT THE DASH EATING PLAN? For the DASH eating plan, you will follow these general guidelines:  Choose foods with a percent daily value for sodium of less than 5% (as listed on the food label).  Use salt-free seasonings or herbs instead of table salt or sea salt.  Check with your health care provider or pharmacist before using salt substitutes.  Eat lower-sodium products, often labeled as "lower sodium" or "no salt added."  Eat fresh foods.  Eat more vegetables, fruits, and low-fat dairy products.  Choose whole grains. Look for the word "whole" as the first word in the ingredient list.  Choose fish   Limit sweets, desserts, sugars, and sugary drinks.  Choose heart-healthy fats.  Eat veggie cheese   Eat more home-cooked food and less restaurant, buffet, and fast food.  Limit fried foods.  Cook foods using methods other than frying.  Limit canned vegetables. If you do use them, rinse them well to decrease the sodium.  When eating at a restaurant, ask that your food be prepared with less salt, or no salt if possible.                      WHAT FOODS CAN I EAT? Read Dr Fara Olden Fuhrman's books on The End of Dieting & The End of Diabetes  Grains Whole grain or whole wheat bread. Brown rice. Whole grain or whole wheat pasta. Quinoa, bulgur, and whole grain cereals. Low-sodium cereals. Corn or whole wheat flour tortillas. Whole grain cornbread. Whole grain crackers. Low-sodium crackers.  Vegetables Fresh or frozen vegetables (raw, steamed, roasted, or grilled). Low-sodium or reduced-sodium tomato and vegetable juices. Low-sodium or reduced-sodium tomato sauce and paste. Low-sodium or reduced-sodium canned vegetables.   Fruits All fresh, canned (in natural juice), or frozen fruits.  Protein Products  All fish and seafood.  Dried beans, peas, or lentils. Unsalted nuts and seeds. Unsalted canned beans.  Dairy Low-fat  dairy products, such as skim or 1% milk, 2% or reduced-fat cheeses, low-fat ricotta or cottage cheese, or plain low-fat yogurt. Low-sodium or reduced-sodium cheeses.  Fats and Oils Tub margarines without trans fats. Light or reduced-fat mayonnaise and salad dressings (reduced sodium). Avocado. Safflower, olive, or canola oils. Natural peanut or almond butter.  Other Unsalted popcorn and pretzels. The items listed above may not be a complete list of recommended foods or beverages. Contact your dietitian for more options.  +++++++++++++++  WHAT FOODS ARE NOT RECOMMENDED? Grains/ White flour or wheat flour White bread. White pasta. White rice. Refined cornbread. Bagels and croissants. Crackers that contain trans fat.  Vegetables  Creamed or fried vegetables. Vegetables in a . Regular canned vegetables. Regular canned tomato  sauce and paste. Regular tomato and vegetable juices.  Fruits Dried fruits. Canned fruit in light or heavy syrup. Fruit juice.  Meat and Other Protein Products Meat in general - RED meat & White meat.  Fatty cuts of meat. Ribs, chicken wings, all processed meats as bacon, sausage, bologna, salami, fatback, hot dogs, bratwurst and packaged luncheon meats.  Dairy Whole or 2% milk, cream, half-and-half, and cream cheese. Whole-fat or sweetened yogurt. Full-fat cheeses or blue cheese. Non-dairy creamers and whipped toppings. Processed cheese, cheese spreads, or cheese curds.  Condiments Onion and garlic salt, seasoned salt, table salt, and sea salt. Canned and packaged gravies. Worcestershire sauce. Tartar sauce. Barbecue sauce. Teriyaki sauce. Soy sauce, including reduced sodium. Steak sauce. Fish sauce. Oyster sauce. Cocktail sauce. Horseradish. Ketchup and mustard. Meat flavorings and tenderizers. Bouillon cubes. Hot sauce. Tabasco sauce. Marinades. Taco seasonings. Relishes.  Fats and Oils Butter, stick margarine, lard, shortening and bacon fat. Coconut, palm kernel,  or palm oils. Regular salad dressings.  Pickles and olives. Salted popcorn and pretzels.  The items listed above may not be a complete list of foods and beverages to avoid.

## 2019-02-23 NOTE — Progress Notes (Signed)
3 Month Follow Up   Assessment and Plan:   Denise Bush was seen today for follow-up.  Diagnoses and all orders for this visit:  Essential hypertension Continue current medications Monitor blood pressure at home; call if consistently over 130/80 Continue DASH diet.   Reminder to go to the ER if any CP, SOB, nausea, dizziness, severe HA, changes vision/speech, left arm numbness and tingling and jaw pain.  -     CBC with Differential/Platelet -     COMPLETE METABOLIC PANEL WITH GFR -     Cancel: Magnesium  Mixed hyperlipidemia Continue medications: Zetia & Lopid Discussed dietary and exercise modifications Low fat diet -     Lipid panel  Vitamin D deficiency Continue supplementation -    VITAMIN D 25 Hydroxy (Vit-D Deficiency)  Hypothyroidism, unspecified type -      TSH -     levothyroxine (SYNTHROID) 150 MCG tablet; Take 155mg daily except for Sun  Reminder to take on an empty stomach 30-67ms before first meal of the day. No antacid medications for 4 hours.  Kidney replaced by transplant Follows with transpalnt MD once a year Doing well Continue medications  Proliferative diabetic retinopathy with macular edema associated with type 2 diabetes mellitus, unspecified laterality (HCLayhillRegular eye exam Follows with Dr PoRaylene Miyamotoiabetes mellitus with cataract (HCBuffalo-     Hemoglobin A1c -     Cancel: Insulin, random End stage renal disease on dialysis due to type 2 diabetes mellitus (HCBrownfieldFollow with Nephrology  Type 2 diabetes mellitus with chronic kidney disease on chronic dialysis, without long-term current use of insulin (HCAlturasFollows with Endocrinology & Nephrology Continue glucose monitoring Continue medications Increase fluids Avoid NSAIDS Discussed dietary modifications and exercise Will continue to monitor Hyperparathyroidism, secondary renal (HCGeronimoFollows with Endocrinology  Diabetic polyneuropathy associated with type 2 diabetes mellitus (HCKit CarsonContinue  medications: Follows with Endocrinology, GaJanina MayoNP Discussed general issues about diabetes pathophysiology and management. Education: Reviewed 'ABCs' of diabetes management (respective goals in parentheses):  A1C (<7), blood pressure (<130/80), and cholesterol (LDL <70) Dietary recommendations Encouraged aerobic exercise.  Discussed foot care, check daily Yearly retinal exam Dental exam every 6 months Monitor blood glucose, discussed goal for patient   Primary osteoarthritis involving multiple joints Doing well at this time  Body mass index (BMI) of 28.0 to 28.9 in adult Discussed dietary and exercise modifications   Medication management Continued -     CBC with Differential/Platelet -     COMPLETE METABOLIC PANEL WITH GFR -     Cancel: Magnesium -     Lipid panel -     Cancel: TSH -     Hemoglobin A1c -     Cancel: Insulin, random -     Cancel: VITAMIN D 25 Hydroxy (Vit-D Deficiency, Fractures)  Hypothyroidism due to Hashimoto's thyroiditis -     levothyroxine (SYNTHROID) 150 MCG tablet; Take 12572mdaily except for Sun Asymptomatic Continue current medications Will check TSH Reminder to take on an empty stomach 30-59m48mbefore first meal of the day.  Continue diet and meds as discussed. Further disposition pending results of labs. Discussed med's effects and SE's.  Patient agrees with plan of care and opportunity to ask questions/voice concerns. Over 30 minutes of chart review, interview, exam, counseling, and critical decision making was performed.   Future Appointments  Date Time Provider [place]/06/2018  8:00 AM [facility] ROOM 8 [facility] None  05/29/2019 11:30 AM McKeUnk Pinto [facility] None  10/07/2019  10:00 AM Garnet Sierras, NP [facility] None  12/04/2019  2:00 PM Unk Pinto, MD [facility] None    ----------------------------------------------------------------------------------------------------------------------  HPI 68  y.o. female  presents for 3 month follow up on HTN, HLD, Hypothyroidism, history of DMII, weight and vitamin D deficiency.   BMI is Body mass index is 28.96 kg/m., she has not been working on diet and exercise. Wt Readings from Last 3 Encounters:  02/25/19 174 lb (78.9 kg)  02/04/19 168 lb (76.2 kg)  01/07/19 167 lb (75.8 kg)    HTN predates 1998 Her blood pressure has been controlled at home, today their BP is BP: 118/66  She does not workout. She denies any cardiac symptoms, chest pains, palpitations, shortness of breath, dizziness or lower extremity edema.     She is on cholesterol medication Zetia and Lopid and denies myalgias. Her cholesterol is at goal. The cholesterol last visit was:   Lab Results  Component Value Date   CHOL 173 02/25/2019   HDL 63 02/25/2019   LDLCALC 91 02/25/2019   TRIG 92 02/25/2019   CHOLHDL 2.7 02/25/2019    She has not been working on diet and exercise for DMII with CKD and started on dialysis 01/2015.  She is legally blind as complication of diabetic retinopathy, total on left.  She has been taper off her diabetes oral medication related to declining kidney function and received a transplant in 12/2017.  Her glucose intolerance has worsened since the transplant and she is on Lantus and Humalog managed by MD's at [facility].  She is currently taking Lantus 18units once a day in the morning and Humalog sliding scale AC&HS 250-270 she should give 3 units.,  She has not had to give herself any sliding scale insuline in months.  She last met with the Endocrinologist one month ago via phone.  She is also working with a dietitian every 2-3 months and her next appointment is in one day via telephone. She denies nausea, paresthesia of the feet, polydipsia, polyuria, visual disturbances, vomiting and weight loss. Last A1C in the office was:  Lab Results  Component Value Date   HGBA1C 8.6 (H) 02/25/2019   Patient is on Vitamin D supplement.   Lab Results  Component Value  Date   VD25OH 26 (L) 11/21/2018     Patient has hypothyroidism and on replacement since 1971, Hashimoto's.  Currently taking levothyroxine mcg daily.  Her medication was changed last visit. Lab Results  Component Value Date   TSH 0.61 11/21/2018            Current Medications:  Current Outpatient Medications on File Prior to Visit  Medication Sig  . amLODipine (NORVASC) 2.5 MG tablet Take 5 mg by mouth daily.  Marland Kitchen aspirin EC 81 MG tablet Take 81 mg by mouth daily.  . belatacept (NULOJIX) 250 MG SOLR injection Inject into the vein.  . carvedilol (COREG) 6.25 MG tablet Take 6.25 mg by mouth 2 (two) times daily with a meal.  . Continuous Blood Gluc Receiver (FREESTYLE LIBRE 14 DAY READER) DEVI Use 1 Application as directed  . Continuous Blood Gluc Sensor (FREESTYLE LIBRE 14 DAY SENSOR) MISC Place 1 Device onto the skin every 14 (fourteen) days. DX-E11.22,n18.6,Z99.2  . ezetimibe (ZETIA) 10 MG tablet Take 1 tablet (10 mg total) by mouth daily.  Marland Kitchen gemfibrozil (LOPID) 600 MG tablet Take 1 tablet 2 x  /day with Meals for Cholesterol & Triglycerides  . insulin glargine (LANTUS) 100 UNIT/ML injection Inject 18 Units into the  skin daily.   . insulin lispro (HUMALOG) 100 UNIT/ML KwikPen Take 5 units breakfast and dinner, 6 units with lunch + sliding scale. (Patient taking differently: as needed. )  . Insulin Pen Needle (FIFTY50 PEN NEEDLES) 31G X 5 MM MISC Test sugars Three to four times daily  . Lancets Misc. (UNISTIK 2 NORMAL) MISC Use 1 each 4 (four) times daily Product selection permitted according to insurance preference. E11.9 Type 2 diabetes mellitus  . linagliptin (TRADJENTA) 5 MG TABS tablet Take by mouth.  . sirolimus (RAPAMUNE) 1 MG tablet Take 1 mg by mouth daily.  Marland Kitchen glucose blood (PRODIGY NO CODING BLOOD GLUC) test strip 1 each.  Marland Kitchen UNABLE TO FIND Use 1 Application as directed  . UNABLE TO FIND Use 1 Device every 14 (fourteen) days   No current facility-administered medications on  file prior to visit.     Allergies:  Allergies  Allergen Reactions  . Hydralazine Shortness Of Breath    Chest pain fatigue Chest pain  . Azor [Amlodipine-Olmesartan] Other (See Comments)    Sharp chest pain  Sharp chest pain  Chest pain  . Hydrochlorothiazide W-Triamterene   . Labetalol     Per patient, caused foot pain  . Lisinopril Other (See Comments)    Severe radiating foot pain.  . Metformin Itching    Loss of bowel control, general sick feeling  . Metoclopramide Other (See Comments)  . Other Other (See Comments)    "contractions in throat" Diazide pt states cause throat to swell.  . Minoxidil Rash    Choking, neck contractions fatigue  . Spironolactone Other (See Comments) and Rash    Severe radiating pain in feet Severe radiating pain in feet.  . Statins Rash    Fatigue Cause fatigue     Medical History:  Past Medical History:  Diagnosis Date  . Allergy   . Blood transfusion without reported diagnosis   . Cataract    bilateral  . Diabetes mellitus without complication ([place])   . Diabetic retinopathy ([place])   . ESRD (end stage renal disease) ([place])   . Hashimoto's disease   . Hyperlipidemia   . Hypertension   . Hypothyroid   . Loss of vision    both eyes, no vision left eye and little in left     Family history- Reviewed and unchanged   Social history- Reviewed and unchanged   Patient Care Team: Unk Pinto, MD as PCP - General (Internal Medicine) Rexene Agent, MD as Attending Physician (Nephrology) Edrick Oh, MD as Consulting Physician (Nephrology) Gardiner Barefoot, DPM as Consulting Physician (Podiatry) Raylene Miyamoto, Jake Samples, MD as Referring Physician (Ophthalmology) Janina Mayo, NP as Nurse Practitioner (Nurse Practitioner) Leona Singleton, RN as [place] Management   Screening Tests: Immunization History  Administered Date(s) Administered  . Influenza Whole 04/23/2007  . Influenza, High Dose Seasonal  PF 01/08/2019  . Influenza-Unspecified 01/05/2014, 02/28/2016  . Pneumococcal-Unspecified 01/05/2010      Review of Systems:  Review of Systems  Constitutional: Negative for chills, diaphoresis, fever, malaise/fatigue and weight loss.  HENT: Negative for congestion, ear discharge, ear pain, hearing loss, nosebleeds, sinus pain, sore throat and tinnitus.   Eyes: Negative for blurred vision, double vision, photophobia, pain, discharge and redness.       Legally blind  Respiratory: Negative for cough, hemoptysis, sputum production, shortness of breath, wheezing and stridor.   Cardiovascular: Negative for chest pain, palpitations, orthopnea, claudication, leg swelling and PND.  Gastrointestinal: Negative for abdominal  pain, blood in stool, constipation, diarrhea, heartburn, melena, nausea and vomiting.  Genitourinary: Negative for dysuria, flank pain, frequency, hematuria and urgency.  Musculoskeletal: Negative for back pain, falls, joint pain, myalgias and neck pain.  Skin: Negative for itching and rash.  Neurological: Negative for dizziness, tingling, tremors, sensory change, speech change, focal weakness, seizures, loss of consciousness, weakness and headaches.  Endo/Heme/Allergies: Negative for environmental allergies and polydipsia. Does not bruise/bleed easily.  Psychiatric/Behavioral: Negative for depression, hallucinations, memory loss, substance abuse and suicidal ideas. The patient is not nervous/anxious and does not have insomnia.       Physical Exam: BP 118/66   Pulse 62   Temp (!) 97.3 F (36.3 C)   Wt 174 lb (78.9 kg)   LMP  (LMP Unknown)   SpO2 97%   BMI 28.96 kg/m  Wt Readings from Last 3 Encounters:  02/25/19 174 lb (78.9 kg)  02/04/19 168 lb (76.2 kg)  01/07/19 167 lb (75.8 kg)   General Appearance: Well nourished, in no apparent distress. Eyes: PERRLA, EOMs, conjunctiva no swelling or erythema Sinuses: No Frontal/maxillary tenderness ENT/Mouth: Ext aud  canals clear, TMs without erythema, bulging. No erythema, swelling, or exudate on post pharynx.  Tonsils not swollen or erythematous. Hearing normal.  Neck: Supple, thyroid normal.  Respiratory: Respiratory effort normal, BS equal bilaterally without rales, rhonchi, wheezing or stridor.  Cardio: RRR with no MRGs. Brisk peripheral pulses without edema.  Abdomen: Soft, + BS.  Non tender, no guarding, rebound, hernias, masses. Lymphatics: Non tender without lymphadenopathy.  Musculoskeletal: Full ROM, 5/5 strength, Normal gait Skin: Warm, dry without rashes, lesions, ecchymosis.  Neuro: Cranial nerves intact. No cerebellar symptoms.  Psych: Awake and oriented X 3, normal affect, Insight and Judgment appropriate.    Garnet Sierras, NP [facility] 2:20 PM

## 2019-02-25 ENCOUNTER — Other Ambulatory Visit: Payer: Self-pay

## 2019-02-25 ENCOUNTER — Encounter: Payer: Self-pay | Admitting: Adult Health Nurse Practitioner

## 2019-02-25 ENCOUNTER — Ambulatory Visit (INDEPENDENT_AMBULATORY_CARE_PROVIDER_SITE_OTHER): Payer: Medicare Other | Admitting: Adult Health Nurse Practitioner

## 2019-02-25 ENCOUNTER — Ambulatory Visit: Payer: Medicare Other | Admitting: Adult Health

## 2019-02-25 VITALS — BP 118/66 | HR 62 | Temp 97.3°F | Wt 174.0 lb

## 2019-02-25 DIAGNOSIS — E038 Other specified hypothyroidism: Secondary | ICD-10-CM

## 2019-02-25 DIAGNOSIS — E039 Hypothyroidism, unspecified: Secondary | ICD-10-CM

## 2019-02-25 DIAGNOSIS — E1122 Type 2 diabetes mellitus with diabetic chronic kidney disease: Secondary | ICD-10-CM

## 2019-02-25 DIAGNOSIS — N2581 Secondary hyperparathyroidism of renal origin: Secondary | ICD-10-CM | POA: Diagnosis not present

## 2019-02-25 DIAGNOSIS — E1136 Type 2 diabetes mellitus with diabetic cataract: Secondary | ICD-10-CM | POA: Diagnosis not present

## 2019-02-25 DIAGNOSIS — N186 End stage renal disease: Secondary | ICD-10-CM

## 2019-02-25 DIAGNOSIS — Z79899 Other long term (current) drug therapy: Secondary | ICD-10-CM | POA: Diagnosis not present

## 2019-02-25 DIAGNOSIS — I10 Essential (primary) hypertension: Secondary | ICD-10-CM

## 2019-02-25 DIAGNOSIS — Z940 Kidney transplant status: Secondary | ICD-10-CM

## 2019-02-25 DIAGNOSIS — E559 Vitamin D deficiency, unspecified: Secondary | ICD-10-CM | POA: Diagnosis not present

## 2019-02-25 DIAGNOSIS — Z6828 Body mass index (BMI) 28.0-28.9, adult: Secondary | ICD-10-CM

## 2019-02-25 DIAGNOSIS — E113519 Type 2 diabetes mellitus with proliferative diabetic retinopathy with macular edema, unspecified eye: Secondary | ICD-10-CM

## 2019-02-25 DIAGNOSIS — E063 Autoimmune thyroiditis: Secondary | ICD-10-CM

## 2019-02-25 DIAGNOSIS — M8949 Other hypertrophic osteoarthropathy, multiple sites: Secondary | ICD-10-CM

## 2019-02-25 DIAGNOSIS — M159 Polyosteoarthritis, unspecified: Secondary | ICD-10-CM

## 2019-02-25 DIAGNOSIS — E782 Mixed hyperlipidemia: Secondary | ICD-10-CM

## 2019-02-25 DIAGNOSIS — Z992 Dependence on renal dialysis: Secondary | ICD-10-CM

## 2019-02-25 DIAGNOSIS — M150 Primary generalized (osteo)arthritis: Secondary | ICD-10-CM

## 2019-02-25 DIAGNOSIS — E1142 Type 2 diabetes mellitus with diabetic polyneuropathy: Secondary | ICD-10-CM

## 2019-02-25 MED ORDER — LEVOTHYROXINE SODIUM 150 MCG PO TABS: ORAL_TABLET | ORAL | Status: DC

## 2019-02-26 ENCOUNTER — Ambulatory Visit: Payer: Medicare Other | Admitting: Podiatry

## 2019-02-26 LAB — CBC WITH DIFFERENTIAL/PLATELET
Absolute Monocytes: 434 cells/uL (ref 200–950)
Basophils Absolute: 21 cells/uL (ref 0–200)
Basophils Relative: 0.6 %
Eosinophils Absolute: 60 cells/uL (ref 15–500)
Eosinophils Relative: 1.7 %
HCT: 36.9 % (ref 35.0–45.0)
Hemoglobin: 11.7 g/dL (ref 11.7–15.5)
Lymphs Abs: 1019 cells/uL (ref 850–3900)
MCH: 27 pg (ref 27.0–33.0)
MCHC: 31.7 g/dL — ABNORMAL LOW (ref 32.0–36.0)
MCV: 85.2 fL (ref 80.0–100.0)
MPV: 11 fL (ref 7.5–12.5)
Monocytes Relative: 12.4 %
Neutro Abs: 1967 cells/uL (ref 1500–7800)
Neutrophils Relative %: 56.2 %
Platelets: 173 10*3/uL (ref 140–400)
RBC: 4.33 10*6/uL (ref 3.80–5.10)
RDW: 16.3 % — ABNORMAL HIGH (ref 11.0–15.0)
Total Lymphocyte: 29.1 %
WBC: 3.5 10*3/uL — ABNORMAL LOW (ref 3.8–10.8)

## 2019-02-26 LAB — COMPLETE METABOLIC PANEL WITH GFR
AG Ratio: 1.6 (calc) (ref 1.0–2.5)
ALT: 12 U/L (ref 6–29)
AST: 13 U/L (ref 10–35)
Albumin: 4.1 g/dL (ref 3.6–5.1)
Alkaline phosphatase (APISO): 94 U/L (ref 37–153)
BUN: 22 mg/dL (ref 7–25)
CO2: 25 mmol/L (ref 20–32)
Calcium: 9.9 mg/dL (ref 8.6–10.4)
Chloride: 108 mmol/L (ref 98–110)
Creat: 0.92 mg/dL (ref 0.50–0.99)
GFR, Est African American: 75 mL/min/{1.73_m2} (ref >=60)
GFR, Est Non African American: 64 mL/min/{1.73_m2} (ref >=60)
Globulin: 2.6 g/dL (calc) (ref 1.9–3.7)
Glucose, Bld: 218 mg/dL — ABNORMAL HIGH (ref 65–99)
Potassium: 4.4 mmol/L (ref 3.5–5.3)
Sodium: 142 mmol/L (ref 135–146)
Total Bilirubin: 0.3 mg/dL (ref 0.2–1.2)
Total Protein: 6.7 g/dL (ref 6.1–8.1)

## 2019-02-26 LAB — HEMOGLOBIN A1C
Hgb A1c MFr Bld: 8.6 % of total Hgb — ABNORMAL HIGH (ref <5.7)
Mean Plasma Glucose: 200 (calc)
eAG (mmol/L): 11.1 (calc)

## 2019-02-26 LAB — LIPID PANEL
Cholesterol: 173 mg/dL (ref <200)
HDL: 63 mg/dL (ref >=50)
LDL Cholesterol (Calc): 91 mg/dL (calc)
Non-HDL Cholesterol (Calc): 110 mg/dL (calc) (ref <130)
Total CHOL/HDL Ratio: 2.7 (calc) (ref <5.0)
Triglycerides: 92 mg/dL (ref <150)

## 2019-02-27 ENCOUNTER — Other Ambulatory Visit: Payer: Self-pay

## 2019-03-04 ENCOUNTER — Ambulatory Visit (HOSPITAL_COMMUNITY)
Admission: RE | Admit: 2019-03-04 | Discharge: 2019-03-04 | Disposition: A | Discharge status: Home / Self Care | Payer: Medicare Other | Source: Ambulatory Visit | Attending: Internal Medicine | Admitting: Internal Medicine

## 2019-03-04 DIAGNOSIS — Z940 Kidney transplant status: Secondary | ICD-10-CM | POA: Insufficient documentation

## 2019-03-04 DIAGNOSIS — Z5181 Encounter for therapeutic drug level monitoring: Secondary | ICD-10-CM | POA: Diagnosis not present

## 2019-03-04 MED ORDER — SODIUM CHLORIDE 0.9 % IV SOLN
5.0000 mg/kg | INTRAVENOUS | Status: DC
  Administered 2019-03-04: 08:00:00 387.5 mg via INTRAVENOUS
  Filled 2019-03-04: qty 387.5

## 2019-03-05 ENCOUNTER — Other Ambulatory Visit: Payer: Self-pay | Admitting: *Deleted

## 2019-03-05 NOTE — Patient Outreach (Signed)
[facility]) Care Management  03/05/2019  Denise Bush [birth year] [id number]   Telephone Screen  Referral Date:  02/27/2019 Referral Source:  EMMI Prevent Reason for Referral:  Screening Insurance:  Medicare   Outreach Attempt:  Outreach attempt #1 to patient for telephone screening. Phone went straight to voicemail but unable to leave voicemail message due to voicemail box being full.  Plan:  RN Health Coach will send unsuccessful outreach letter to patient.  RN Health Coach will make another outreach attempt to patient within 3-4 business days.  [place] [phone] [email or web address]

## 2019-03-06 ENCOUNTER — Other Ambulatory Visit: Payer: Self-pay | Admitting: *Deleted

## 2019-03-06 NOTE — Patient Outreach (Signed)
[name]) Care Management  03/06/2019  Denise Bush 1951/01/14 [id number]   Telephone Screen  Referral Date:  02/27/2019 Referral Source:  EMMI Prevent Reason for Referral:  Screening Insurance:  Medicare   Outreach Attempt:  Successful telephone outreach to patient for telephone screening.  HIPAA verified with patient.  RN Health Coach introduced self and role.  [facility] services reviewed and discussed with patient.  Patient stating she lives with husband and her father.  States she is managing her care well with the assistance of her husband.  She is blind and her husband manages her medications.  Patient reporting that she did have trouble with transportation in the past when she was on hemodialysis and due to [place] the city [place], but that was a over a year ago prior to her renal transplant; transportation is no longer an issue.  States she is fine right now but may be interested in services in the future for her, her husband, and maybe her father.  Offered Disease Manages outreaches to patient, she declines.  Agrees to receive [facility] pamphlet in the mail.  Encouraged patient to contact [facility] in the future if she or other family members would benefit from services.  Declines complete screening process at this time.  Plan:  RN Health Coach will send Successful Outreach Letter with [place].  RN Health Coach will close case and make patient inactive based on patient declining services at this time.  [place] [phone] Darla [email or web address]

## 2019-03-12 DIAGNOSIS — E113513 Type 2 diabetes mellitus with proliferative diabetic retinopathy with macular edema, bilateral: Secondary | ICD-10-CM | POA: Diagnosis not present

## 2019-03-31 ENCOUNTER — Other Ambulatory Visit (HOSPITAL_COMMUNITY): Payer: Self-pay | Admitting: *Deleted

## 2019-04-01 ENCOUNTER — Other Ambulatory Visit: Payer: Self-pay

## 2019-04-01 ENCOUNTER — Ambulatory Visit (HOSPITAL_COMMUNITY)
Admission: RE | Admit: 2019-04-01 | Discharge: 2019-04-01 | Disposition: A | Discharge status: Home / Self Care | Payer: Medicare Other | Source: Ambulatory Visit | Attending: Internal Medicine | Admitting: Internal Medicine

## 2019-04-01 DIAGNOSIS — Z5181 Encounter for therapeutic drug level monitoring: Secondary | ICD-10-CM | POA: Diagnosis not present

## 2019-04-01 DIAGNOSIS — Z940 Kidney transplant status: Secondary | ICD-10-CM | POA: Diagnosis not present

## 2019-04-01 MED ORDER — SODIUM CHLORIDE 0.9 % IV SOLN
5.0000 mg/kg | INTRAVENOUS | Status: AC
  Administered 2019-04-01: 09:00:00 387.5 mg via INTRAVENOUS
  Filled 2019-04-01: qty 387.5

## 2019-04-02 ENCOUNTER — Other Ambulatory Visit: Payer: Self-pay | Admitting: Internal Medicine

## 2019-04-02 DIAGNOSIS — Z1231 Encounter for screening mammogram for malignant neoplasm of breast: Secondary | ICD-10-CM

## 2019-04-29 ENCOUNTER — Inpatient Hospital Stay (HOSPITAL_COMMUNITY): Admission: RE | Admit: 2019-04-29 | Payer: Medicare Other | Source: Ambulatory Visit

## 2019-04-30 ENCOUNTER — Encounter (HOSPITAL_COMMUNITY)
Admission: RE | Admit: 2019-04-30 | Discharge: 2019-04-30 | Disposition: A | Discharge status: Home / Self Care | Payer: Medicare Other | Source: Ambulatory Visit | Attending: Internal Medicine | Admitting: Internal Medicine

## 2019-04-30 ENCOUNTER — Other Ambulatory Visit: Payer: Self-pay

## 2019-04-30 DIAGNOSIS — Z940 Kidney transplant status: Secondary | ICD-10-CM | POA: Insufficient documentation

## 2019-04-30 MED ORDER — SODIUM CHLORIDE 0.9 % IV SOLN
312.5000 mg | INTRAVENOUS | Status: DC
  Administered 2019-04-30: 312.5 mg via INTRAVENOUS
  Filled 2019-04-30: qty 312.5

## 2019-05-07 ENCOUNTER — Other Ambulatory Visit: Payer: Self-pay

## 2019-05-07 ENCOUNTER — Encounter: Payer: Self-pay | Admitting: Podiatry

## 2019-05-07 ENCOUNTER — Ambulatory Visit (INDEPENDENT_AMBULATORY_CARE_PROVIDER_SITE_OTHER): Payer: Medicare Other | Admitting: Podiatry

## 2019-05-07 DIAGNOSIS — B351 Tinea unguium: Secondary | ICD-10-CM | POA: Diagnosis not present

## 2019-05-07 DIAGNOSIS — M79675 Pain in left toe(s): Secondary | ICD-10-CM

## 2019-05-07 DIAGNOSIS — M79674 Pain in right toe(s): Secondary | ICD-10-CM

## 2019-05-07 DIAGNOSIS — E1142 Type 2 diabetes mellitus with diabetic polyneuropathy: Secondary | ICD-10-CM

## 2019-05-07 NOTE — Progress Notes (Signed)
Patient ID: Denise Bush, female   DOB: 06-01-1950, 69 y.o.   MRN: [id number] Complaint:  Visit Type: Patient returns to my office for continued preventative foot care services. Complaint: Patient states" my nails have grown long and thick and become painful to walk and wear shoes" Patient has been diagnosed with DM with no foot complications. The patient presents for preventative foot care services. No changes to ROS.  She has received a new kidney. Podiatric Exam: Vascular: dorsalis pedis and posterior tibial pulses are palpable bilateral. Capillary return is immediate. Temperature gradient is WNL. Skin turgor WNL  Sensorium: Diminished  Semmes Weinstein monofilament test. Normal tactile sensation bilaterally. Nail Exam: Pt has thick disfigured discolored nails with subungual debris noted bilateral entire nail hallux through fifth toenails Ulcer Exam: There is no evidence of ulcer or pre-ulcerative changes or infection. Orthopedic Exam: Muscle tone and strength are WNL. No limitations in general ROM. No crepitus or effusions noted. Foot type and digits show no abnormalities. Bony prominences are unremarkable. Skin:  Asymptomatic  Porokeratosis sub 5th met left foot. No infection or ulcers  Diagnosis:  Onychomycosis, , Pain in right toe, pain in left toes, .  Treatment & Plan Procedures and Treatment: Consent by patient was obtained for treatment procedures. The patient understood the discussion of treatment and procedures well. All questions were answered thoroughly reviewed. Debridement of mycotic and hypertrophic toenails, 1 through 5 bilateral and clearing of subungual debris. No ulceration, no infection noted.  .. Return Visit-Office Procedure: Patient instructed to return to the office for a follow up visit 4 months for continued evaluation and treatment.Gardiner Barefoot DPM

## 2019-05-22 ENCOUNTER — Ambulatory Visit
Admission: RE | Admit: 2019-05-22 | Discharge: 2019-05-22 | Disposition: A | Discharge status: Home / Self Care | Payer: Medicare Other | Source: Ambulatory Visit | Attending: Internal Medicine | Admitting: Internal Medicine

## 2019-05-22 ENCOUNTER — Other Ambulatory Visit: Payer: Self-pay

## 2019-05-22 DIAGNOSIS — Z1231 Encounter for screening mammogram for malignant neoplasm of breast: Secondary | ICD-10-CM

## 2019-05-27 ENCOUNTER — Other Ambulatory Visit (HOSPITAL_COMMUNITY): Payer: Self-pay | Admitting: *Deleted

## 2019-05-28 ENCOUNTER — Ambulatory Visit (HOSPITAL_COMMUNITY)
Admission: RE | Admit: 2019-05-28 | Discharge: 2019-05-28 | Disposition: A | Discharge status: Home / Self Care | Payer: Medicare Other | Source: Ambulatory Visit | Attending: Internal Medicine | Admitting: Internal Medicine

## 2019-05-28 ENCOUNTER — Other Ambulatory Visit: Payer: Self-pay

## 2019-05-28 DIAGNOSIS — Z940 Kidney transplant status: Secondary | ICD-10-CM | POA: Insufficient documentation

## 2019-05-28 MED ORDER — SODIUM CHLORIDE 0.9 % IV SOLN
312.5000 mg | INTRAVENOUS | Status: DC
  Administered 2019-05-28: 312.5 mg via INTRAVENOUS
  Filled 2019-05-28: qty 312.5

## 2019-05-29 ENCOUNTER — Ambulatory Visit: Payer: Medicare Other | Admitting: Internal Medicine

## 2019-06-05 NOTE — Progress Notes (Signed)
History of Present Illness:      This very nice 69 y.o. [name] presents for 6 month follow up with HTN, HLD, T2_DMw/hx/ ESRD, Blind, Hypothyroidism  and Vitamin D Deficiency.  On Sept 2020, patient underwent cadaver donor Kidney transplant at [facility].       Patient is treated for HTN since 1998 & BP has been controlled at home. Todays BP is at goal - 126/66. Patient has had no complaints of any cardiac type chest pain, palpitations, dyspnea / orthopnea / PND, dizziness, claudication, or dependent edema.      Hyperlipidemia is controlled with diet & Gemfibrozil & Ezetimibe. Patient denies myalgias or other med SEs. Last Lipids were at goal:  Lab Results  Component Value Date   CHOL 173 02/25/2019   HDL 63 02/25/2019   LDLCALC 91 02/25/2019   TRIG 92 02/25/2019   CHOLHDL 2.7 02/25/2019        Also, the patient has history of T2_NIDDM (1988) w/ESRD starting on Dialysis Nov 2016 until transplant (12/2018) she was switched from oral agents to Lantus/Humalog SS at [place] her insulin dosing is being managed at [facility]. She also is legally blind consequent of her Diabetic Retinopathy.  She has had no symptoms of reactive hypoglycemia, diabetic polys or paresthesias.  Last A1c was not at goal:  Lab Results  Component Value Date   HGBA1C 8.6 (H) 02/25/2019       Patient also has hx/o  Hashimoto's Dzat age 58 (32) & has been on Thyroid replacement.       Further, the patient also has history of Vitamin D Deficiency ("25" / 2016)  and supplements vitamin D without any suspected side-effects. Last vitamin D was not at goal:  Lab Results  Component Value Date   VD25OH 26 (L) 11/21/2018    Current Outpatient Medications on File Prior to Visit  Medication Sig   amLODipine (NORVASC) 2.5 MG tablet Take 5 mg by mouth daily.   aspirin EC 81 MG tablet Take 81 mg by mouth daily.   belatacept (NULOJIX) 250 MG SOLR injection Inject into the vein.   Blood Glucose Monitoring  Suppl (FIFTY50 GLUCOSE METER 2.0) w/Device KIT Use as directed Product selection permitted according to insurance preference. E11.9 Type 2 diabetes mellitus   carvedilol (COREG) 6.25 MG tablet Take 6.25 mg by mouth 2 (two) times daily with a meal.   Cholecalciferol 25 MCG (1000 UT) tablet Take by mouth.   Continuous Blood Gluc Receiver (FREESTYLE LIBRE 14 DAY READER) DEVI Use 1 Application as directed   Continuous Blood Gluc Sensor (FREESTYLE LIBRE 14 DAY SENSOR) MISC Place 1 Device onto the skin every 14 (fourteen) days. DX-E11.22,n18.6,Z99.2   ezetimibe (ZETIA) 10 MG tablet Take 1 tablet (10 mg total) by mouth daily.   gemfibrozil (LOPID) 600 MG tablet Take 1 tablet 2 x  /day with Meals for Cholesterol & Triglycerides   glucose blood (PRODIGY NO CODING BLOOD GLUC) test strip 1 each.   insulin glargine (LANTUS) 100 UNIT/ML injection Inject 18 Units into the skin daily.    insulin lispro (HUMALOG) 100 UNIT/ML KwikPen Take 5 units breakfast and dinner, 6 units with lunch + sliding scale. (Patient taking differently: as needed. )   Insulin Pen Needle (FIFTY50 PEN NEEDLES) 31G X 5 MM MISC Test sugars Three to four times daily   Lancets Misc. (UNISTIK 2 NORMAL) MISC Use 1 each 4 (four) times daily Product selection permitted according to insurance preference.  E11.9 Type 2 diabetes mellitus   levothyroxine (SYNTHROID) 150 MCG tablet Take 172mg daily except for Sun   linagliptin (TRADJENTA) 5 MG TABS tablet Take by mouth.   SENSIPAR 30 MG tablet    sirolimus (RAPAMUNE) 1 MG tablet Take 1 mg by mouth daily.   sucroferric oxyhydroxide (VELPHORO) 500 MG chewable tablet Chew by mouth.   UNABLE TO FIND Use 1 Application as directed   UNABLE TO FIND Use 1 Device every 14 (fourteen) days   No current facility-administered medications on file prior to visit.    Allergies  Allergen Reactions   Hydralazine Shortness Of Breath    Chest pain fatigue Chest pain   Azor  [Amlodipine-Olmesartan] Other (See Comments)    Sharp chest pain  Sharp chest pain  Chest pain   Hydrochlorothiazide W-Triamterene    Labetalol     Per patient, caused foot pain   Lisinopril Other (See Comments)    Severe radiating foot pain.   Metformin Itching    Loss of bowel control, general sick feeling   Metoclopramide Other (See Comments)   Other Other (See Comments)    "contractions in throat" Diazide pt states cause throat to swell.   Minoxidil Rash    Choking, neck contractions fatigue   Spironolactone Other (See Comments) and Rash    Severe radiating pain in feet Severe radiating pain in feet.   Statins Rash    Fatigue Cause fatigue    PMHx:   Past Medical History:  Diagnosis Date   Allergy    Blood transfusion without reported diagnosis    Cataract    bilateral   Di[place] complication [place]    Diabetic retinopathy ([place]    [place]) [place]    Hashimoto's disease    Hyperlipidemia    Hypertension    Hypothyroid    Loss of vision    both eyes, no vision left eye and little in left    Immunization History  Administered Date(s) Administered   Influenza Whole 04/23/2007   Influenza, High Dose Seasonal PF 01/08/2019   Influenza-Unspecified 01/05/2014, 02/28/2016   Pneumococcal-Unspecified 01/05/2010   Past Surgical History:  Procedure Laterality Date   ABDOMINAL HYSTERECTOMY     BREAST BIOPSY Right 2015   benign   COLONOSCOPY     EYE SURGERY     KNEE SURGERY      FHx:    Reviewed / unchanged  SHx:    Reviewed / unchanged   Systems Review:  Constitutional: Denies fever, chills, wt changes, headaches, insomnia, fatigue, night sweats, change in appetite. Eyes: Denies redness, blurred vision, diplopia, discharge, itchy, watery eyes.  ENT: Denies discharge, congestion, post nasal drip, epistaxis, sore throat, earache, hearing loss, dental pain, tinnitus, vertigo, sinus pain, snoring.   CV: Denies chest pain, palpitations, irregular heartbeat, syncope, dyspnea, diaphoresis, orthopnea, PND, claudication or edema. Respiratory: denies cough, dyspnea, DOE, pleurisy, hoarseness, laryngitis, wheezing.  Gastrointestinal: Denies dysphagia, odynophagia, heartburn, reflux, water brash, abdominal pain or cramps, nausea, vomiting, bloating, diarrhea, constipation, hematemesis, melena, hematochezia  or hemorrhoids. Genitourinary: Denies dysuria, frequency, urgency, nocturia, hesitancy, discharge, hematuria or flank pain. Musculoskeletal: Denies arthralgias, myalgias, stiffness, jt. swelling, pain, limping or strain/sprain.  Skin: Denies pruritus, rash, hives, warts, acne, eczema or change in skin lesion(s). Neuro: No weakness, tremor, incoordination, spasms, paresthesia or pain. Psychiatric: Denies confusion, memory loss or sensory loss. Endo: Denies change in weight, skin or hair change.  Heme/Lymph: No excessive bleeding, bruising or enlarged lymph nodes.  Physical Exam  BP 126/66    Pulse 68    Temp (!) 96.8 F (36 C)    Ht 5' 4"  (1.626 m)    Wt 173 lb (78.5 kg)    LMP  (LMP Unknown)    SpO2 99%    BMI 29.70 kg/m   Appears  Over nourished, well groomed  and in no distress.  Eyes: PERRLA, EOMs, conjunctiva no swelling or erythema. Decreased vision to light & hand motion Sinuses: No frontal/maxillary tenderness ENT/Mouth: EAC's clear, TM's nl w/o erythema, bulging. Nares clear w/o erythema, swelling, exudates. Oropharynx clear without erythema or exudates. Oral hygiene is good. Tongue normal, non obstructing. Hearing intact.  Neck: Supple. Thyroid not palpable. Car 2+/2+ without bruits, nodes or JVD. Chest: Respirations nl with BS clear & equal w/o rales, rhonchi, wheezing or stridor.  Cor: Heart sounds normal w/ regular rate and rhythm without sig. murmurs, gallops, clicks or rubs. Peripheral pulses normal and equal  without edema.  Abdomen: Soft & bowel sounds normal. Non-tender  w/o guarding, rebound, hernias, masses or organomegaly.  Lymphatics: Unremarkable.  Musculoskeletal: Full ROM all peripheral extremities, joint stability, 5/5 strength and normal gait.  Skin: Warm, dry without exposed rashes, lesions or ecchymosis apparent.  Neuro: Cranial nerves intact, reflexes equal bilaterally. Sensory-motor testing grossly intact. Tendon reflexes grossly Sensation intact to touch, vibratory and Monofilament to the toes bilaterally. Pysch: Alert & oriented x 3.  Insight and judgement nl & appropriate. No ideations.  Assessment and Plan:  1. Essential hypertension  - Continue medication, monitor blood pressure at home.  - Continue DASH diet.  Reminder to go to the ER if any CP,  SOB, nausea, dizziness, severe HA, changes vision/speech.  - CBC with Differential/Platelet - Magnesium - COMPLETE METABOLIC PANEL WITH GFR - TSH  2. Hyperlipidemia associated with type 2 diabetes mellitus ([place])  - Continue diet/meds, exercise,& lifestyle modifications.  - Continue monitor periodic cholesterol/liver & renal functions   - Lipid panel - TSH  3. Type 2 diabetes mellitus with stage 2 chronic kidney disease,  with long-term current use of insulin (H[facility])  - Continue diet, exercise  - Lifestyle modifications.  - Monitor appropriate labs.  - Hemoglobin A1c  4. Vitamin D deficiency  - Continue supplementation.   - VITAMIN D 25 Hydroxy  5. Kidney replaced by transplant  - COMPLETE METABOLIC PANEL WITH GFR  6. Hypothyroidism  - TSH  7. Legally blind  8. Medication management  - CBC with Differential/Platelet - Magnesium - COMPLETE METABOLIC PANEL WITH GFR - Lipid panel - TSH - Hemoglobin A1c - VITAMIN D 25 Hydroxy         Discussed  regular exercise, BP monitoring, weight control to achieve/maintain BMI less than 25 and discussed med and SE's. Recommended labs to assess and monitor clinical status with further disposition pending results of labs.  I  discussed the assessment and treatment plan with the patient. The patient was provided an opportunity to ask questions and all were answered. The patient agreed with the plan and demonstrated an understanding of the instructions.  I provided over 30 minutes of exam, counseling, chart review and  complex critical decision making.  Kirtland Bouchard, MD  This note is not being shared with the patient for the following reason: To prevent harm (release of this note would result in harm to the life or physical safety of the patient or another).

## 2019-06-06 ENCOUNTER — Other Ambulatory Visit: Payer: Self-pay

## 2019-06-06 ENCOUNTER — Ambulatory Visit (INDEPENDENT_AMBULATORY_CARE_PROVIDER_SITE_OTHER): Payer: Medicare Other | Admitting: Internal Medicine

## 2019-06-06 ENCOUNTER — Encounter: Payer: Self-pay | Admitting: Internal Medicine

## 2019-06-06 VITALS — BP 126/66 | HR 68 | Temp 96.8°F | Ht 64.0 in | Wt 173.0 lb

## 2019-06-06 DIAGNOSIS — E1122 Type 2 diabetes mellitus with diabetic chronic kidney disease: Secondary | ICD-10-CM

## 2019-06-06 DIAGNOSIS — I10 Essential (primary) hypertension: Secondary | ICD-10-CM

## 2019-06-06 DIAGNOSIS — E1169 Type 2 diabetes mellitus with other specified complication: Secondary | ICD-10-CM

## 2019-06-06 DIAGNOSIS — E559 Vitamin D deficiency, unspecified: Secondary | ICD-10-CM

## 2019-06-06 DIAGNOSIS — H548 Legal blindness, as defined in USA: Secondary | ICD-10-CM

## 2019-06-06 DIAGNOSIS — E039 Hypothyroidism, unspecified: Secondary | ICD-10-CM

## 2019-06-06 DIAGNOSIS — Z794 Long term (current) use of insulin: Secondary | ICD-10-CM

## 2019-06-06 DIAGNOSIS — E785 Hyperlipidemia, unspecified: Secondary | ICD-10-CM

## 2019-06-06 DIAGNOSIS — N182 Chronic kidney disease, stage 2 (mild): Secondary | ICD-10-CM

## 2019-06-06 DIAGNOSIS — Z940 Kidney transplant status: Secondary | ICD-10-CM

## 2019-06-06 DIAGNOSIS — Z79899 Other long term (current) drug therapy: Secondary | ICD-10-CM

## 2019-06-06 NOTE — Patient Instructions (Signed)

## 2019-06-07 LAB — COMPLETE METABOLIC PANEL WITH GFR
AG Ratio: 1.6 (calc) (ref 1.0–2.5)
ALT: 10 U/L (ref 6–29)
AST: 15 U/L (ref 10–35)
Albumin: 4.1 g/dL (ref 3.6–5.1)
Alkaline phosphatase (APISO): 70 U/L (ref 37–153)
BUN: 20 mg/dL (ref 7–25)
CO2: 25 mmol/L (ref 20–32)
Calcium: 10.3 mg/dL (ref 8.6–10.4)
Chloride: 111 mmol/L — ABNORMAL HIGH (ref 98–110)
Creat: 0.97 mg/dL (ref 0.50–0.99)
GFR, Est African American: 70 mL/min/{1.73_m2} (ref >=60)
GFR, Est Non African American: 60 mL/min/{1.73_m2} (ref >=60)
Globulin: 2.5 g/dL (calc) (ref 1.9–3.7)
Glucose, Bld: 166 mg/dL — ABNORMAL HIGH (ref 65–99)
Potassium: 4.6 mmol/L (ref 3.5–5.3)
Sodium: 143 mmol/L (ref 135–146)
Total Bilirubin: 0.3 mg/dL (ref 0.2–1.2)
Total Protein: 6.6 g/dL (ref 6.1–8.1)

## 2019-06-07 LAB — CBC WITH DIFFERENTIAL/PLATELET
Absolute Monocytes: 457 cells/uL (ref 200–950)
Basophils Absolute: 22 cells/uL (ref 0–200)
Basophils Relative: 0.6 %
Eosinophils Absolute: 61 cells/uL (ref 15–500)
Eosinophils Relative: 1.7 %
HCT: 34.7 % — ABNORMAL LOW (ref 35.0–45.0)
Hemoglobin: 11.3 g/dL — ABNORMAL LOW (ref 11.7–15.5)
Lymphs Abs: 1073 cells/uL (ref 850–3900)
MCH: 26.9 pg — ABNORMAL LOW (ref 27.0–33.0)
MCHC: 32.6 g/dL (ref 32.0–36.0)
MCV: 82.6 fL (ref 80.0–100.0)
MPV: 11.4 fL (ref 7.5–12.5)
Monocytes Relative: 12.7 %
Neutro Abs: 1987 cells/uL (ref 1500–7800)
Neutrophils Relative %: 55.2 %
Platelets: 206 10*3/uL (ref 140–400)
RBC: 4.2 10*6/uL (ref 3.80–5.10)
RDW: 16.1 % — ABNORMAL HIGH (ref 11.0–15.0)
Total Lymphocyte: 29.8 %
WBC: 3.6 10*3/uL — ABNORMAL LOW (ref 3.8–10.8)

## 2019-06-07 LAB — LIPID PANEL
Cholesterol: 149 mg/dL (ref <200)
HDL: 63 mg/dL (ref >=50)
LDL Cholesterol (Calc): 70 mg/dL (calc)
Non-HDL Cholesterol (Calc): 86 mg/dL (calc) (ref <130)
Total CHOL/HDL Ratio: 2.4 (calc) (ref <5.0)
Triglycerides: 82 mg/dL (ref <150)

## 2019-06-07 LAB — HEMOGLOBIN A1C
Hgb A1c MFr Bld: 8.2 % of total Hgb — ABNORMAL HIGH (ref <5.7)
Mean Plasma Glucose: 189 (calc)
eAG (mmol/L): 10.4 (calc)

## 2019-06-07 LAB — MAGNESIUM: Magnesium: 2 mg/dL (ref 1.5–2.5)

## 2019-06-07 LAB — VITAMIN D 25 HYDROXY (VIT D DEFICIENCY, FRACTURES): Vit D, 25-Hydroxy: 32 ng/mL (ref 30–100)

## 2019-06-07 LAB — TSH: TSH: 1.12 mIU/L (ref 0.40–4.50)

## 2019-06-25 ENCOUNTER — Inpatient Hospital Stay (HOSPITAL_COMMUNITY): Admission: RE | Admit: 2019-06-25 | Payer: Medicare Other | Source: Ambulatory Visit

## 2019-07-23 ENCOUNTER — Other Ambulatory Visit (HOSPITAL_COMMUNITY): Payer: Self-pay | Admitting: *Deleted

## 2019-07-24 ENCOUNTER — Ambulatory Visit (HOSPITAL_COMMUNITY)
Admission: RE | Admit: 2019-07-24 | Discharge: 2019-07-24 | Disposition: A | Discharge status: Home / Self Care | Payer: Medicare Other | Source: Ambulatory Visit | Attending: Internal Medicine | Admitting: Internal Medicine

## 2019-07-24 ENCOUNTER — Other Ambulatory Visit: Payer: Self-pay

## 2019-07-24 DIAGNOSIS — Z940 Kidney transplant status: Secondary | ICD-10-CM | POA: Insufficient documentation

## 2019-07-24 MED ORDER — SODIUM CHLORIDE 0.9 % IV SOLN
312.5000 mg | INTRAVENOUS | Status: DC
  Administered 2019-07-24: 312.5 mg via INTRAVENOUS
  Filled 2019-07-24: qty 312.5

## 2019-08-05 ENCOUNTER — Other Ambulatory Visit: Payer: Self-pay | Admitting: Internal Medicine

## 2019-08-05 DIAGNOSIS — M25561 Pain in right knee: Secondary | ICD-10-CM

## 2019-08-05 DIAGNOSIS — M25562 Pain in left knee: Secondary | ICD-10-CM

## 2019-08-05 DIAGNOSIS — M25559 Pain in unspecified hip: Secondary | ICD-10-CM

## 2019-08-05 DIAGNOSIS — G8929 Other chronic pain: Secondary | ICD-10-CM

## 2019-08-21 ENCOUNTER — Other Ambulatory Visit: Payer: Self-pay

## 2019-08-21 ENCOUNTER — Ambulatory Visit (HOSPITAL_COMMUNITY)
Admission: RE | Admit: 2019-08-21 | Discharge: 2019-08-21 | Disposition: A | Discharge status: Home / Self Care | Payer: Medicare Other | Source: Ambulatory Visit | Attending: Internal Medicine | Admitting: Internal Medicine

## 2019-08-21 DIAGNOSIS — Z940 Kidney transplant status: Secondary | ICD-10-CM | POA: Insufficient documentation

## 2019-08-21 MED ORDER — SODIUM CHLORIDE 0.9 % IV SOLN
312.5000 mg | INTRAVENOUS | Status: DC
  Administered 2019-08-21: 312.5 mg via INTRAVENOUS
  Filled 2019-08-21: qty 312.5

## 2019-09-03 ENCOUNTER — Ambulatory Visit: Payer: Medicare Other | Admitting: Podiatry

## 2019-09-15 ENCOUNTER — Encounter: Payer: Self-pay | Admitting: Internal Medicine

## 2019-09-18 ENCOUNTER — Ambulatory Visit (HOSPITAL_COMMUNITY)
Admission: RE | Admit: 2019-09-18 | Discharge: 2019-09-18 | Disposition: A | Discharge status: Home / Self Care | Payer: Medicare Other | Source: Ambulatory Visit | Attending: Internal Medicine | Admitting: Internal Medicine

## 2019-09-18 ENCOUNTER — Other Ambulatory Visit: Payer: Self-pay

## 2019-09-18 DIAGNOSIS — Z940 Kidney transplant status: Secondary | ICD-10-CM | POA: Diagnosis not present

## 2019-09-18 MED ORDER — SODIUM CHLORIDE 0.9 % IV SOLN
312.5000 mg | INTRAVENOUS | Status: DC
  Administered 2019-09-18: 312.5 mg via INTRAVENOUS
  Filled 2019-09-18: qty 312.5

## 2019-10-07 ENCOUNTER — Encounter: Payer: Self-pay | Admitting: Adult Health Nurse Practitioner

## 2019-10-07 ENCOUNTER — Other Ambulatory Visit: Payer: Self-pay

## 2019-10-07 ENCOUNTER — Ambulatory Visit (INDEPENDENT_AMBULATORY_CARE_PROVIDER_SITE_OTHER): Payer: Medicare Other | Admitting: Adult Health Nurse Practitioner

## 2019-10-07 VITALS — BP 126/82 | HR 64 | Temp 97.5°F | Ht 64.0 in | Wt 174.4 lb

## 2019-10-07 DIAGNOSIS — H548 Legal blindness, as defined in USA: Secondary | ICD-10-CM

## 2019-10-07 DIAGNOSIS — E1169 Type 2 diabetes mellitus with other specified complication: Secondary | ICD-10-CM | POA: Diagnosis not present

## 2019-10-07 DIAGNOSIS — E1136 Type 2 diabetes mellitus with diabetic cataract: Secondary | ICD-10-CM | POA: Diagnosis not present

## 2019-10-07 DIAGNOSIS — I700 Atherosclerosis of aorta: Secondary | ICD-10-CM

## 2019-10-07 DIAGNOSIS — E1122 Type 2 diabetes mellitus with diabetic chronic kidney disease: Secondary | ICD-10-CM | POA: Diagnosis not present

## 2019-10-07 DIAGNOSIS — Z0001 Encounter for general adult medical examination with abnormal findings: Secondary | ICD-10-CM

## 2019-10-07 DIAGNOSIS — E785 Hyperlipidemia, unspecified: Secondary | ICD-10-CM

## 2019-10-07 DIAGNOSIS — Z940 Kidney transplant status: Secondary | ICD-10-CM

## 2019-10-07 DIAGNOSIS — Z992 Dependence on renal dialysis: Secondary | ICD-10-CM

## 2019-10-07 DIAGNOSIS — Z79899 Other long term (current) drug therapy: Secondary | ICD-10-CM

## 2019-10-07 DIAGNOSIS — N182 Chronic kidney disease, stage 2 (mild): Secondary | ICD-10-CM

## 2019-10-07 DIAGNOSIS — Z6824 Body mass index (BMI) 24.0-24.9, adult: Secondary | ICD-10-CM

## 2019-10-07 DIAGNOSIS — R6889 Other general symptoms and signs: Secondary | ICD-10-CM

## 2019-10-07 DIAGNOSIS — M150 Primary generalized (osteo)arthritis: Secondary | ICD-10-CM

## 2019-10-07 DIAGNOSIS — N2581 Secondary hyperparathyroidism of renal origin: Secondary | ICD-10-CM

## 2019-10-07 DIAGNOSIS — Z794 Long term (current) use of insulin: Secondary | ICD-10-CM

## 2019-10-07 DIAGNOSIS — E038 Other specified hypothyroidism: Secondary | ICD-10-CM

## 2019-10-07 DIAGNOSIS — N186 End stage renal disease: Secondary | ICD-10-CM

## 2019-10-07 DIAGNOSIS — E559 Vitamin D deficiency, unspecified: Secondary | ICD-10-CM

## 2019-10-07 DIAGNOSIS — I10 Essential (primary) hypertension: Secondary | ICD-10-CM

## 2019-10-07 DIAGNOSIS — M8949 Other hypertrophic osteoarthropathy, multiple sites: Secondary | ICD-10-CM

## 2019-10-07 DIAGNOSIS — E063 Autoimmune thyroiditis: Secondary | ICD-10-CM

## 2019-10-07 DIAGNOSIS — Z0000 Encounter for general adult medical examination without abnormal findings: Secondary | ICD-10-CM

## 2019-10-07 DIAGNOSIS — M159 Polyosteoarthritis, unspecified: Secondary | ICD-10-CM

## 2019-10-07 NOTE — Progress Notes (Addendum)
MEDICARE ANNUAL WELLNESS VISIT AND FOLLOW UP Assessment:    Diagnoses and all orders for this visit:  Medicare annual wellness visit, subsequent Yearly  Essential hypertension Continue medication: Monitor blood pressure at home; call if consistently over 130/80 Continue DASH diet.   Reminder to go to the ER if any CP, SOB, nausea, dizziness, severe HA, changes vision/speech, left arm numbness and tingling and jaw pain. -     CBC with Differential/Platelet -     COMPLETE METABOLIC PANEL WITH GFR  Hyperlipidemia associated with type 2 diabetes mellitus (HCC) Continue medications: Discussed dietary and exercise modifications Low fat diet -     Lipid panel  End stage renal disease on dialysis due to type 2 diabetes mellitus ([place]) Kidney replaced by transplant Sirolimus daily Follow up for labs in begining of July  Type 2 diabetes mellitus with stage 2 chronic kidney disease, with long-term current use of insulin ([place]) Diabetes mellitus with cataract ([place]) Follows with Endocrinology Lantus 18units daily Tradjenta 5mg  -Order for freestyle Libre to better evaluate blood glucose levels related to hyperglycemia.  Hyperparathyroidism, secondary renal ([place]) Continued follow up  Hypothyroidism due to Hashimoto's thyroiditis Taking levothyroxine 125mg  5 days a week. Reminder to take on an empty stomach 30-21mins before first meal of the day. No antacid medications for 4 hours. Follows with Endocrinology  Aortic atherosclerosis ([place]) Control blood pressure, lipids and glucose Disscused lifestyle modifications, diet & exercise Continue to monitor  Primary osteoarthritis involving multiple joints Received injection by Ortho to right hip, improved Right knee pain, following with ortho, considering visco supplementation  Vitamin D deficiency Continue supplementation to maintain goal of 60-100  Legally blind Discussed safety Routine opthalmology evaluations  Q26months Husband assists patient  Medication management Continued  Body mass index (BMI) of 24.0 to 24.9 in adult Discussed dietary and exercise modifications   Follow Up Instructions:    I discussed the assessment and treatment plan with the patient. The patient was provided an opportunity to ask questions and all were answered. The patient agreed with the plan and demonstrated an understanding of the instructions.   The patient was advised to call back or seek an in-person evaluation if the symptoms worsen or if the condition fails to improve as anticipated.  I provided 30 minutes of face-to-face time during this encounter including counseling, chart review, and critical decision making was preformed.   Future Appointments  Date Time Provider [place]  10/16/2019  9:00 AM [facility] ROOM 4 [facility] None  11/07/2019 11:15 AM Gardiner Barefoot, DPM [facility]  12/04/2019  2:00 PM Unk Pinto, MD [facility] None      Plan:   During the course of the visit the patient was educated and counseled about appropriate screening and preventive services including:    Pneumococcal vaccine   Influenza vaccine  Prevnar 13  Td vaccine  Screening electrocardiogram, deferred, telephone visit [place].  Colorectal cancer screening  Diabetes screening  Glaucoma screening  Nutrition counseling    Subjective:  Denise Bush is a 69 y.o. female who presents for Medicare Annual Wellness Visit and 3 month follow up for HTN, HLD,DMII, legally blind, hypothyroidism, ESRD and now S/P recipient of kidney transplant and vitamin D Def.   She follows with Dr Lynnette Caffey at [facility] s/p kidney transplnt.  Two weeks ago she reports that her Sirolimus 1MG  was changed to taking daily, related to low BK viremia.  She reports there were not any changes to her medicaitons and her blood pressure has  improved.  She follows with endocrinology and her last OV was 08/04/19.  She has history of  DMII and injecting 18units of lantus every morning..  She is also taking tradjenta 5mg  daily.  Her fasting glucose ranges from 90-170 and her afternoon range is about 170-200.  She was having hyperglycemia related to injection for right hip pain.  She was using Humalin insulin during this time but has since decreased her need for this but reports her readings are not back to baseline.  She continues to check her blood glucose AC&HS  Also reports having to check her blood sugar in between meals related to hyperglycemia.  Some days checking 6-9 times a day while managing her blood glucose levels. Los are automatically kept by the [employer] and this information can be obtained from the patient, our office or at [facility] to help manage her insulin regiment.  She has ESRD s/p transplant.  She reports significant improvement with her right hip after receiving an injection. She also reports some right knee pain that is intermittent and 8/10 with and achy quality.  When this bothers her she reports she elevates her right leg and this helps to relieve the discomfort.  She has not taken any OTC medications.  She is seeing Orthopedics for her right knee pain and looking to get gel injection to help.  She reports that when she gets up to walk or move it is a catching feeling.    She saw podiatry last month. She follows with them regularly.   Her blood pressure has been controlled at home.  She is unable to read the numbers on the machine.  Her husband usually does this for her. Today their BP is BP: 126/82 She does workout. She denies chest pain, shortness of breath, dizziness.  She is on cholesterol medication and denies myalgias. Her cholesterol is not at goal. The cholesterol last visit was:   Lab Results  Component Value Date   CHOL 149 06/06/2019   HDL 63 06/06/2019   LDLCALC 70 06/06/2019   TRIG 82 06/06/2019   CHOLHDL 2.4 06/06/2019   She has been working on diet and exercise for DMII, and denies foot  ulcerations, hypoglycemia , increased appetite, nausea, paresthesia of the feet, polydipsia, polyuria, visual disturbances, vomiting and weight loss. Last A1C in the office was:  Lab Results  Component Value Date   HGBA1C 8.2 (H) 06/06/2019  She follows with endocrinology at [facility] and last A1c was 7.6%.  Last GFR Lab Results  Component Value Date   GFRNONAA 60 06/06/2019    Lab Results  Component Value Date   GFRAA 70 06/06/2019   Patient is on Vitamin D supplement for defciency (25, 2016).   Lab Results  Component Value Date   VD25OH 32 06/06/2019     Patient has hypothyroidism and on replacement since 1971.  Currently taking levothyroxine 133mcg six days a week, skip one day.  Her medication was not changed last visit. Lab Results  Component Value Date   TSH 1.12 06/06/2019     Medication Review:  Current Outpatient Medications (Endocrine & Metabolic):  .  insulin glargine (LANTUS) 100 UNIT/ML injection, Inject 18 Units into the skin daily.  Marland Kitchen  levothyroxine (SYNTHROID) 150 MCG tablet, Take 123mcg daily except for Sun .  linagliptin (TRADJENTA) 5 MG TABS tablet, Take by mouth. .  SENSIPAR 30 MG tablet,   Current Outpatient Medications (Cardiovascular):  .  amLODipine (NORVASC) 2.5 MG tablet, Take 5 mg by  mouth daily. .  carvedilol (COREG) 6.25 MG tablet, Take 6.25 mg by mouth 2 (two) times daily with a meal. .  ezetimibe (ZETIA) 10 MG tablet, Take 1 tablet (10 mg total) by mouth daily. Marland Kitchen  gemfibrozil (LOPID) 600 MG tablet, Take 1 tablet 2 x  /day with Meals for Cholesterol & Triglycerides   Current Outpatient Medications (Analgesics):  .  aspirin EC 81 MG tablet, Take 81 mg by mouth daily.   Current Outpatient Medications (Other):  .  belatacept (NULOJIX) 250 MG SOLR injection, Inject into the vein. .  Continuous Blood Gluc Receiver (FREESTYLE LIBRE 14 DAY READER) DEVI, Use 1 Application as directed .  Continuous Blood Gluc Sensor (FREESTYLE LIBRE 14 DAY SENSOR)  MISC, Place 1 Device onto the skin every 14 (fourteen) days. DX-E11.22,n18.6,Z99.2 .  glucose blood (PRODIGY NO CODING BLOOD GLUC) test strip, 1 each. .  Insulin Pen Needle (FIFTY50 PEN NEEDLES) 31G X 5 MM MISC, Test sugars Three to four times daily .  Lancets Misc. (UNISTIK 2 NORMAL) MISC, Use 1 each 4 (four) times daily Product selection permitted according to insurance preference. E11.9 Type 2 diabetes mellitus .  sirolimus (RAPAMUNE) 1 MG tablet, Take 1 mg by mouth daily.  Allergies: Allergies  Allergen Reactions  . Hydralazine Shortness Of Breath    Chest pain fatigue Chest pain  . Azor [Amlodipine-Olmesartan] Other (See Comments)    Sharp chest pain  Sharp chest pain  Chest pain  . Hydrochlorothiazide W-Triamterene   . Labetalol     Per patient, caused foot pain  . Lisinopril Other (See Comments)    Severe radiating foot pain.  . Metformin Itching    Loss of bowel control, general sick feeling  . Metoclopramide Other (See Comments)  . Other Other (See Comments)    "contractions in throat" Diazide pt states cause throat to swell.  . Minoxidil Rash    Choking, neck contractions fatigue  . Spironolactone Other (See Comments) and Rash    Severe radiating pain in feet Severe radiating pain in feet.  . Statins Rash    Fatigue Cause fatigue    Current Problems (verified) has Hypothyroidism; Essential hypertension; Allergic rhinitis; Mixed hyperlipidemia; Vitamin D deficiency; Medication management; Diabetic proliferative retinopathy ([place]); Arthritis, degenerative; Body mass index (BMI) of 24.0 to 24.9 in adult; End stage renal disease on dialysis due to type 2 diabetes mellitus ([place]); Hyperparathyroidism, secondary renal ([place]); Diabetes mellitus with cataract ([place]); Kidney replaced by transplant; Pain due to onychomycosis of toenails of both feet; and Diabetic neuropathy ([place]) on their problem list.  Screening Tests Immunization History  Administered Date(s) Administered   . Influenza Split 01/19/2011  . Influenza Whole 04/23/2007  . Influenza, High Dose Seasonal PF 01/08/2019  . Influenza, Seasonal, Injecte, Preservative Fre 01/25/2012, 01/30/2013, 03/05/2014  . Influenza,inj,quad, With Preservative 03/10/2015  . Influenza-Unspecified 01/05/2014, 02/28/2016, 01/08/2019  . PFIZER SARS-COV-2 Vaccination 06/13/2019  . Pneumococcal Conjugate-13 03/06/2017  . Pneumococcal Polysaccharide-23 02/22/2016  . Pneumococcal-Unspecified 01/05/2010  . Zoster 07/14/2019  . Zoster Recombinat (Shingrix) 03/26/2019, 07/14/2019    Preventative care: Last colonoscopy: 2017 Mammogram: 05/2019 DEXA: 01/2019  Prior vaccinations: TD or Tdap: DUE, discussed with patient Influenza: 2019  Pneumococcal: 2011 Prevnar13: 03/2017 Shingles/Zostavax: Shingrix completed 07/14/19 SARS-COV2-Pfizer - complete 06/07/19, 06/21/19   Names of Other Physician/Practitioners you currently use: 1. [facility] here for primary care 2. Eye Exam: Every 6 months Dr Raylene Miyamoto 06/2019 3.Dental Exam: 06/2018, Due   Patient Care Team: Unk Pinto, MD as  PCP - General (Internal Medicine) Rexene Agent, MD as Attending Physician (Nephrology) Edrick Oh, MD as Consulting Physician (Nephrology) Gardiner Barefoot, DPM as Consulting Physician (Podiatry) Raylene Miyamoto, Jake Samples, MD as Referring Physician (Ophthalmology) Janina Mayo, NP as Nurse Practitioner (Nurse Practitioner)  Surgical: She  has a past surgical history that includes Eye surgery; Abdominal hysterectomy; Knee surgery; Colonoscopy; and Breast biopsy (Right, 2015). Family Her family history includes Breast cancer in her maternal aunt, maternal aunt, maternal grandmother, and sister; CAD in an other family member; Colon polyps in her maternal aunt and maternal uncle. Social history  She reports that she has never smoked. She has never used smokeless tobacco. She reports that she does not drink  alcohol. No history on file for drug.  MEDICARE WELLNESS OBJECTIVES: Physical activity: Current Exercise Habits: The patient does not participate in regular exercise at present, Exercise limited by: orthopedic condition(s) Cardiac risk factors: Cardiac Risk Factors include: diabetes mellitus;dyslipidemia;advanced age (>81men, >79 women);sedentary lifestyle;hypertension Depression/mood screen:   Depression screen [facility] 2/9 10/07/2019  Decreased Interest 0  Down, Depressed, Hopeless 0  PHQ - 2 Score 0    ADLs:  In your present state of health, do you have any difficulty performing the following activities: 10/07/2019 11/23/2018  Hearing? N N  Vision? N Y  Comment legally blind, cane, family support Legally Blind  Difficulty concentrating or making decisions? N N  Walking or climbing stairs? N Y  Comment - requires supervision due to limited vision  Dressing or bathing? N N  Doing errands, shopping? Y Y  Comment Husband/family assists requires Dispensing optician and eating ? N -  In the past six months, have you accidently leaked urine? N -  Do you have problems with loss of bowel control? N -  Managing your Medications? N -  Comment Family assists -  Managing your Finances? Y -  Comment Husband -  Housekeeping or managing your Housekeeping? N -  Some recent data might be hidden     Cognitive Testing  Alert? Yes  Normal Appearance?Yes  Oriented to person? Yes  Place? Yes   Time? Yes  Recall of three objects?  Yes  Can perform simple calculations? Yes  Displays appropriate judgment?Yes  Can read the correct time from a watch face?Yes  EOL planning: Does Patient Have a Medical Advance Directive?: Yes Type of Advance Directive: Healthcare Power of Attorney, Living will Copy of [place] in Chart?: No - copy requested   Objective:   Today's Vitals   10/07/19 1004  BP: 126/82  Pulse: 64  Temp: (!) 97.5 F (36.4 C)  SpO2: 98%  Weight: 174 lb 6.4 oz  (79.1 kg)  Height: 5\' 4"  (1.626 m)  PainSc: 0-No pain   Body mass index is 29.94 kg/m.  General : Well sounding patient in no apparent distress HEENT: no hoarseness, no cough for duration of visit Lungs: speaks in complete sentences, no audible wheezing, no apparent distress Neurological: alert, oriented x 3 Psychiatric: pleasant, judgement appropriate   Medicare Attestation I have personally reviewed: The patient's medical and social history Their use of alcohol, tobacco or illicit drugs Their current medications and supplements The patient's functional ability including ADLs,fall risks, home safety risks, cognitive, and hearing and visual impairment Diet and physical activities Evidence for depression or mood disorders  The patient's weight, height, BMI, and visual acuity have been recorded in the chart.  I have made referrals, counseling, and provided education to the patient based on  review of the above and I have provided the patient with a written personalized care plan for preventive services.     Garnet Sierras, NP [facility] 10/07/2019  10:59 AM

## 2019-10-07 NOTE — Patient Instructions (Signed)
declined

## 2019-10-08 LAB — CBC WITH DIFFERENTIAL/PLATELET
Absolute Monocytes: 394 cells/uL (ref 200–950)
Basophils Absolute: 29 cells/uL (ref 0–200)
Basophils Relative: 0.9 %
Eosinophils Absolute: 61 cells/uL (ref 15–500)
Eosinophils Relative: 1.9 %
HCT: 39.6 % (ref 35.0–45.0)
Hemoglobin: 12.6 g/dL (ref 11.7–15.5)
Lymphs Abs: 1069 cells/uL (ref 850–3900)
MCH: 26.8 pg — ABNORMAL LOW (ref 27.0–33.0)
MCHC: 31.8 g/dL — ABNORMAL LOW (ref 32.0–36.0)
MCV: 84.1 fL (ref 80.0–100.0)
MPV: 11 fL (ref 7.5–12.5)
Monocytes Relative: 12.3 %
Neutro Abs: 1648 cells/uL (ref 1500–7800)
Neutrophils Relative %: 51.5 %
Platelets: 168 10*3/uL (ref 140–400)
RBC: 4.71 10*6/uL (ref 3.80–5.10)
RDW: 16.4 % — ABNORMAL HIGH (ref 11.0–15.0)
Total Lymphocyte: 33.4 %
WBC: 3.2 10*3/uL — ABNORMAL LOW (ref 3.8–10.8)

## 2019-10-08 LAB — COMPLETE METABOLIC PANEL WITH GFR
AG Ratio: 1.4 (calc) (ref 1.0–2.5)
ALT: 11 U/L (ref 6–29)
AST: 17 U/L (ref 10–35)
Albumin: 4.2 g/dL (ref 3.6–5.1)
Alkaline phosphatase (APISO): 76 U/L (ref 37–153)
BUN: 22 mg/dL (ref 7–25)
CO2: 21 mmol/L (ref 20–32)
Calcium: 10.3 mg/dL (ref 8.6–10.4)
Chloride: 109 mmol/L (ref 98–110)
Creat: 0.91 mg/dL (ref 0.50–0.99)
GFR, Est African American: 75 mL/min/{1.73_m2} (ref >=60)
GFR, Est Non African American: 65 mL/min/{1.73_m2} (ref >=60)
Globulin: 3.1 g/dL (calc) (ref 1.9–3.7)
Glucose, Bld: 144 mg/dL — ABNORMAL HIGH (ref 65–99)
Potassium: 4.1 mmol/L (ref 3.5–5.3)
Sodium: 142 mmol/L (ref 135–146)
Total Bilirubin: 0.3 mg/dL (ref 0.2–1.2)
Total Protein: 7.3 g/dL (ref 6.1–8.1)

## 2019-10-08 LAB — LIPID PANEL
Cholesterol: 190 mg/dL (ref <200)
HDL: 63 mg/dL (ref >=50)
LDL Cholesterol (Calc): 105 mg/dL (calc) — ABNORMAL HIGH
Non-HDL Cholesterol (Calc): 127 mg/dL (calc) (ref <130)
Total CHOL/HDL Ratio: 3 (calc) (ref <5.0)
Triglycerides: 120 mg/dL (ref <150)

## 2019-10-16 ENCOUNTER — Ambulatory Visit (HOSPITAL_COMMUNITY)
Admission: RE | Admit: 2019-10-16 | Discharge: 2019-10-16 | Disposition: A | Discharge status: Home / Self Care | Payer: Medicare Other | Source: Ambulatory Visit | Attending: Internal Medicine | Admitting: Internal Medicine

## 2019-10-16 ENCOUNTER — Other Ambulatory Visit: Payer: Self-pay

## 2019-10-16 DIAGNOSIS — Z940 Kidney transplant status: Secondary | ICD-10-CM | POA: Diagnosis not present

## 2019-10-16 MED ORDER — SODIUM CHLORIDE 0.9 % IV SOLN
312.5000 mg | INTRAVENOUS | Status: DC
  Administered 2019-10-16: 312.5 mg via INTRAVENOUS
  Filled 2019-10-16: qty 312.5

## 2019-11-07 ENCOUNTER — Ambulatory Visit (INDEPENDENT_AMBULATORY_CARE_PROVIDER_SITE_OTHER): Payer: Medicare Other | Admitting: Podiatry

## 2019-11-07 ENCOUNTER — Encounter: Payer: Self-pay | Admitting: Podiatry

## 2019-11-07 ENCOUNTER — Other Ambulatory Visit: Payer: Self-pay

## 2019-11-07 DIAGNOSIS — M79675 Pain in left toe(s): Secondary | ICD-10-CM | POA: Diagnosis not present

## 2019-11-07 DIAGNOSIS — E1142 Type 2 diabetes mellitus with diabetic polyneuropathy: Secondary | ICD-10-CM

## 2019-11-07 DIAGNOSIS — M79674 Pain in right toe(s): Secondary | ICD-10-CM | POA: Diagnosis not present

## 2019-11-07 DIAGNOSIS — B351 Tinea unguium: Secondary | ICD-10-CM

## 2019-11-07 NOTE — Progress Notes (Signed)
This patient returns to my office for at risk foot care.  This patient requires this care by a professional since this patient will be at risk due to having diabetic neuropathy.  Patient doing well since her kidney transplant.  This patient is unable to cut her  nails  since the patient cannot reach her nails.These nails are painful walking and wearing shoes.  This patient presents for at risk foot care today.  General Appearance  Alert, conversant and in no acute stress.  Vascular  Dorsalis pedis and posterior tibial  pulses are palpable  bilaterally.  Capillary return is within normal limits  bilaterally. Temperature is within normal limits  bilaterally.  Neurologic  Senn-Weinstein monofilament wire test diminished   bilaterally. Muscle power within normal limits bilaterally.  Nails Thick disfigured discolored nails with subungual debris  from hallux to fifth toes bilaterally. No evidence of bacterial infection or drainage bilaterally.  Orthopedic  No limitations of motion  feet .  No crepitus or effusions noted.  No bony pathology or digital deformities noted.  Skin  normotropic skin with no porokeratosis noted bilaterally.  No signs of infections or ulcers noted.     Onychomycosis  Pain in right toes  Pain in left toes  Consent was obtained for treatment procedures.   Mechanical debridement of nails 1-5  bilaterally performed with a nail nipper.  Filed with dremel without incident.    Return office visit  3 months                      Told patient to return for periodic foot care and evaluation due to potential at risk complications.   Gardiner Barefoot DPM

## 2019-11-13 ENCOUNTER — Inpatient Hospital Stay (HOSPITAL_COMMUNITY): Admission: RE | Admit: 2019-11-13 | Payer: Medicare Other | Source: Ambulatory Visit

## 2019-11-13 ENCOUNTER — Encounter: Payer: Medicare Other | Admitting: Internal Medicine

## 2019-11-14 ENCOUNTER — Other Ambulatory Visit: Payer: Self-pay

## 2019-11-14 ENCOUNTER — Ambulatory Visit (HOSPITAL_COMMUNITY)
Admission: RE | Admit: 2019-11-14 | Discharge: 2019-11-14 | Disposition: A | Discharge status: Home / Self Care | Payer: Medicare Other | Source: Ambulatory Visit | Attending: Internal Medicine | Admitting: Internal Medicine

## 2019-11-14 DIAGNOSIS — Z940 Kidney transplant status: Secondary | ICD-10-CM | POA: Diagnosis not present

## 2019-11-14 MED ORDER — SODIUM CHLORIDE 0.9 % IV SOLN
312.5000 mg | INTRAVENOUS | Status: DC
  Administered 2019-11-14: 312.5 mg via INTRAVENOUS
  Filled 2019-11-14: qty 312.5

## 2019-11-27 ENCOUNTER — Other Ambulatory Visit: Payer: Self-pay | Admitting: Internal Medicine

## 2019-12-04 ENCOUNTER — Encounter: Payer: Medicare Other | Admitting: Internal Medicine

## 2019-12-04 ENCOUNTER — Other Ambulatory Visit: Payer: Self-pay | Admitting: Internal Medicine

## 2019-12-04 DIAGNOSIS — E785 Hyperlipidemia, unspecified: Secondary | ICD-10-CM

## 2019-12-04 DIAGNOSIS — E1169 Type 2 diabetes mellitus with other specified complication: Secondary | ICD-10-CM

## 2019-12-04 DIAGNOSIS — Z940 Kidney transplant status: Secondary | ICD-10-CM

## 2019-12-04 MED ORDER — GEMFIBROZIL 600 MG PO TABS: ORAL_TABLET | ORAL | 0 refills | Status: DC

## 2019-12-04 MED ORDER — SIROLIMUS 1 MG PO TABS: ORAL_TABLET | ORAL | 0 refills | Status: DC

## 2019-12-12 ENCOUNTER — Ambulatory Visit (HOSPITAL_COMMUNITY): Payer: Medicare Other

## 2019-12-15 ENCOUNTER — Ambulatory Visit (HOSPITAL_COMMUNITY)
Admission: RE | Admit: 2019-12-15 | Discharge: 2019-12-15 | Disposition: A | Discharge status: Home / Self Care | Payer: Medicare Other | Source: Ambulatory Visit | Attending: Internal Medicine | Admitting: Internal Medicine

## 2019-12-15 ENCOUNTER — Other Ambulatory Visit: Payer: Self-pay

## 2019-12-15 DIAGNOSIS — Z940 Kidney transplant status: Secondary | ICD-10-CM | POA: Diagnosis present

## 2019-12-15 MED ORDER — SODIUM CHLORIDE 0.9 % IV SOLN
312.5000 mg | INTRAVENOUS | Status: DC
  Administered 2019-12-15: 312.5 mg via INTRAVENOUS
  Filled 2019-12-15: qty 312.5

## 2020-01-09 ENCOUNTER — Other Ambulatory Visit (HOSPITAL_COMMUNITY): Payer: Self-pay

## 2020-01-12 ENCOUNTER — Ambulatory Visit (HOSPITAL_COMMUNITY)
Admission: RE | Admit: 2020-01-12 | Discharge: 2020-01-12 | Disposition: A | Discharge status: Home / Self Care | Payer: Medicare Other | Source: Ambulatory Visit | Attending: Internal Medicine | Admitting: Internal Medicine

## 2020-01-12 DIAGNOSIS — Z940 Kidney transplant status: Secondary | ICD-10-CM | POA: Diagnosis not present

## 2020-01-12 MED ORDER — SODIUM CHLORIDE 0.9 % IV SOLN
312.5000 mg | INTRAVENOUS | Status: DC
  Administered 2020-01-12: 312.5 mg via INTRAVENOUS
  Filled 2020-01-12: qty 312.5

## 2020-01-15 ENCOUNTER — Encounter: Payer: Self-pay | Admitting: Internal Medicine

## 2020-01-15 NOTE — Patient Instructions (Signed)

## 2020-01-15 NOTE — Progress Notes (Signed)
Annual Screening/Preventative Visit & Comprehensive Evaluation &  Examination     This very nice 69 y.o.  [name] presents for a Screening /Preventative Visit & comprehensive evaluation and management of multiple medical co-morbidities.  Patient has been followed for HTN, HLD, T2_NIDDMw/ESRD and Vitamin D Deficiency.     Patient had started Hemodialysis in 2016 and then 1 year ago in Sept 2019, patient received a cadaver donor Kidney Transplant at [facility].       HTN predates circa 1998. Patient's BP has been controlled at home and patient denies any cardiac symptoms as chest pain, palpitations, shortness of breath, dizziness or ankle swelling. Today's BP  Is at goal -  132/72.      Patient's hyperlipidemia is not controlled with diet and medications. Patient denies myalgias or other medication SE's. Last lipids were not at goal:  Lab Results  Component Value Date   CHOL 190 10/07/2019   HDL 63 10/07/2019   LDLCALC 105 (H) 10/07/2019   TRIG 120 10/07/2019   CHOLHDL 3.0 10/07/2019       Patient has hx/o T2_NIDDM (1988) w/ESRD and is blind from Diabetic Retinopathy. Patient is totally blind on the Lt with not even light perception and on the Right perceives hand motion & finger counting Patient denies reactive hypoglycemic symptoms, visual blurring, diabetic polys or paresthesias. Last A1c was not at goal:  Lab Results  Component Value Date   HGBA1C 8.2 (H) 06/06/2019       Patientalso has hx/o Hashimoto's Dzatage 19 (1971)&has been on Thyroid replacement since.         Finally, patient has history of Vitamin D Deficiency ("25" /2016)and last Vitamin D was still very low:  Lab Results  Component Value Date   VD25OH 32 06/06/2019    Current Outpatient Medications on File Prior to Visit  Medication Sig  . amLODipine (NORVASC) 2.5 MG tablet Take 5 mg by mouth daily.  Marland Kitchen aspirin EC 81 MG tablet Take 81 mg by mouth daily.  . belatacept (NULOJIX) 250 MG SOLR injection Inject  into the vein.  . carvedilol (COREG) 6.25 MG tablet Take 6.25 mg by mouth 2 (two) times daily with a meal.  . Continuous Blood Gluc Receiver (FREESTYLE LIBRE 14 DAY READER) DEVI Use 1 Application as directed  . Continuous Blood Gluc Sensor (FREESTYLE LIBRE 14 DAY SENSOR) MISC Place 1 Device onto the skin every 14 (fourteen) days. DX-E11.22,n18.6,Z99.2  . ezetimibe (ZETIA) 10 MG tablet Take 1 tablet (10 mg total) by mouth daily.  Marland Kitchen gemfibrozil (LOPID) 600 MG tablet Take 1 tablet 2 x /day for Triglycerides (Blood Fats)  . insulin glargine (LANTUS) 100 UNIT/ML injection Inject 18 Units into the skin daily.   Marland Kitchen levothyroxine (SYNTHROID) 125 MCG tablet Take 125 mcg by mouth. Takes 1 tablet daily except on Sundays.  Marland Kitchen linagliptin (TRADJENTA) 5 MG TABS tablet Take by mouth.  . sirolimus (RAPAMUNE) 1 MG tablet Take 1 tablet for Kidney Transplant    Allergies  Allergen Reactions  . Hydralazine Shortness Of Breath    Chest pain fatigue Chest pain  . Azor [Amlodipine-Olmesartan] Other (See Comments)    Sharp chest pain  Sharp chest pain  Chest pain  . Hydrochlorothiazide W-Triamterene   . Labetalol     Per patient, caused foot pain  . Lisinopril Other (See Comments)    Severe radiating foot pain.  . Metformin Itching    Loss of bowel control, general sick feeling  . Metoclopramide Other (See Comments)  .  Other Other (See Comments)    "contractions in throat" Diazide pt states cause throat to swell.  . Minoxidil Rash    Choking, neck contractions fatigue  . Spironolactone Other (See Comments) and Rash    Severe radiating pain in feet Severe radiating pain in feet.  . Statins Rash    Fatigue Cause fatigue   Past Medical History:  Diagnosis Date  . Allergy   . Blood transfusion without reported diagnosis   . Cataract    bilateral  . Diabetes mellitus without complication ([place])   . Diabetic retinopathy ([place])   . ESRD (end stage renal disease) ([place])   . Hashimoto's disease   .  Hyperlipidemia   . Hypertension   . Hypothyroid   . Loss of vision    both eyes, no vision left eye and little in left    Health Maintenance  Topic Date Due  . TETANUS/TDAP  Never done  . INFLUENZA VACCINE  11/30/2019  . HEMOGLOBIN A1C  12/04/2019  . FOOT EXAM  01/15/2021  . OPHTHALMOLOGY EXAM  01/15/2021  . PNA vac Low Risk Adult (2 of 2 - PPSV23) 02/21/2021  . MAMMOGRAM  05/21/2021  . COLONOSCOPY  08/22/2025  . DEXA SCAN  Completed  . COVID-19 Vaccine  Completed  . Hepatitis C Screening  Completed  . URINE MICROALBUMIN  Discontinued   Immunization History  Administered Date(s) Administered  . Influenza Split 01/19/2011  . Influenza Whole 04/23/2007  . Influenza, High Dose Seasonal PF 01/08/2019  . Influenza, Seasonal, Injecte, Preservative Fre 01/25/2012, 01/30/2013, 03/05/2014  . Influenza,inj,quad, With Preservative 03/10/2015  . Influenza-Unspecified 01/05/2014, 02/28/2016, 01/08/2019  . PFIZER SARS-COV-2 Vaccination 06/13/2019, 12/30/2019  . Pneumococcal Conjugate-13 03/06/2017  . Pneumococcal Polysaccharide-23 02/22/2016  . Pneumococcal-Unspecified 01/05/2010  . Zoster 07/14/2019  . Zoster Recombinat (Shingrix) 03/26/2019, 07/14/2019    Last Colon - 08/23/2015- Dr Loletha Carrow - recc 10 yr f/u - due Apr 2027  Last MGM - 05/22/2019   Past Surgical History:  Procedure Laterality Date  . ABDOMINAL HYSTERECTOMY    . BREAST BIOPSY Right 2015   benign  . COLONOSCOPY    . EYE SURGERY    . KNEE SURGERY     Family History  Problem Relation Age of Onset  . Breast cancer Sister   . CAD Other        No family history  . Colon polyps Maternal Aunt   . Breast cancer Maternal Aunt   . Colon polyps Maternal Uncle   . Breast cancer Maternal Grandmother   . Breast cancer Maternal Aunt   . Colon cancer Neg Hx    Social History   Tobacco Use  . Smoking status: Never Smoker  . Smokeless tobacco: Never Used  Substance Use Topics  . Alcohol use: No    Alcohol/week: 0.0  standard drinks  . Drug use: Not on file    ROS Constitutional: Denies fever, chills, weight loss/gain, headaches, insomnia,  night sweats, and change in appetite. Does c/o fatigue. Eyes: Denies redness,  discharge, itchy, watery eyes. Legally blind. ENT: Denies discharge, congestion, post nasal drip, epistaxis, sore throat, earache, hearing loss, dental pain, Tinnitus, Vertigo, Sinus pain, snoring.  Cardio: Denies chest pain, palpitations, irregular heartbeat, syncope, dyspnea, diaphoresis, orthopnea, PND, claudication, edema Respiratory: denies cough, dyspnea, DOE, pleurisy, hoarseness, laryngitis, wheezing.  Gastrointestinal: Denies dysphagia, heartburn, reflux, water brash, pain, cramps, nausea, vomiting, bloating, diarrhea, constipation, hematemesis, melena, hematochezia, jaundice, hemorrhoids Genitourinary: Denies dysuria, frequency, urgency, nocturia, hesitancy, discharge, hematuria, flank pain Breast:  Breast lumps, nipple discharge, bleeding.  Musculoskeletal: Denies arthralgia, myalgia, stiffness, Jt. Swelling, pain, limp, and strain/sprain. Denies falls. Skin: Denies puritis, rash, hives, warts, acne, eczema, changing in skin lesion Neuro: No weakness, tremor, incoordination, spasms, paresthesia, pain Psychiatric: Denies confusion, memory loss, sensory loss. Denies Depression. Endocrine: Denies change in weight, skin, hair change, nocturia and paresthesia, diabetic polys,  hyper / hypo glycemic episodes.  Heme/Lymph: No excessive bleeding, bruising, enlarged lymph nodes.  Physical Exam  BP 132/72   Pulse 64   Temp (!) 97.1 F (36.2 C)   Resp 16   Ht 5\' 6"  (1.676 m)   Wt 174 lb 12.8 oz (79.3 kg)   LMP  (LMP Unknown)   BMI 28.21 kg/m   General Appearance: Well nourished, well groomed and in no apparent distress.  Eyes:  EOMs full , Bilat IOL. Fundi not well visualized.  Total blind Lt Eye and intact to hand motion/finger counting  & hand motion on the Rt. Sinuses: No  frontal/maxillary tenderness ENT/Mouth: EACs patent / TMs  nl. Nares clear without erythema, swelling, mucoid exudates. Oral hygiene is good. No erythema, swelling, or exudate. Tongue normal, non-obstructing. Tonsils not swollen or erythematous. Hearing normal.  Neck: Supple, thyroid not palpable. No bruits, nodes or JVD. Respiratory: Respiratory effort normal.  BS equal and clear bilateral without rales, rhonci, wheezing or stridor. Cardio: Heart sounds are normal with regular rate and rhythm and no murmurs, rubs or gallops. Peripheral pulses are normal and equal bilaterally without edema. No aortic or femoral bruits. Chest: symmetric with normal excursions and percussion. Breasts: Symmetric, without lumps, nipple discharge, retractions, or fibrocystic changes.  Abdomen: Flat, soft with bowel sounds active. Nontender, no guarding, rebound, hernias, masses, or organomegaly.  Lymphatics: Non tender without lymphadenopathy.  Musculoskeletal: Full ROM all peripheral extremities, joint stability, 5/5 strength, and normal gait. Skin: Warm and dry without rashes, lesions, cyanosis, clubbing or  ecchymosis.  Neuro: Cranial nerves intact, reflexes equal bilaterally. Normal muscle tone, no cerebellar symptoms. Sensation intact to touch, vibratory and Monofilament to the toes bilaterally. Pysch: Alert and oriented X 3, normal affect, Insight and Judgment appropriate.   Assessment and Plan  1. Essential hypertension  - EKG 12-Lead - Urinalysis, Routine w reflex microscopic - Microalbumin / creatinine urine ratio - CBC with Differential/Platelet - COMPLETE METABOLIC PANEL WITH GFR - Magnesium - TSH  2. Hyperlipidemia associated with type 2 diabetes mellitus ([place])  - EKG 12-Lead - Lipid panel - TSH  3. Type 2 diabetes mellitus with stage 2 chronic kidney disease,  with long-term current use of insulin ([facility])  - EKG 12-Lead - HM DIABETES FOOT EXAM - LOW EXTREMITY NEUR EXAM DOCUM - Hemoglobin  A1c - VITAMIN D 25 Hydroxy   4. Vitamin D deficiency  - VITAMIN D 25 Hydroxy   5. Kidney replaced by transplant   6. Proliferative diabetic retinopathy with traction retinal detachment  involving macula, associated with diabetes mellitus   ([place])   7. Legally blind   8. Hypothyroidism  - TSH  9. Aortic atherosclerosis ([facility])  - EKG 12-Lead  10. Screening for colorectal cancer  - POC Hemoccult Bld/Stl   11. Screening for ischemic heart disease  - EKG 12-Lead  12. FHx: heart disease  - EKG 12-Lead  13. Medication management  - Urinalysis, Routine w reflex microscopic - Microalbumin / creatinine urine ratio - LOW EXTREMITY NEUR EXAM DOCUM - CBC with Differential/Platelet - COMPLETE METABOLIC PANEL WITH GFR - Magnesium - Lipid panel - TSH -  Hemoglobin A1c - Insulin, random - VITAMIN D 25 Hydroxy          Patient was counseled in prudent diet to achieve/maintain BMI less than 25 for weight control, BP monitoring, regular exercise and medications. Discussed med's effects and SE's. Screening labs and tests as requested with regular follow-up as recommended. Over 40 minutes of exam, counseling, chart review and high complex critical decision making was performed.   Kirtland Bouchard, MD

## 2020-01-16 ENCOUNTER — Encounter: Payer: Medicare Other | Admitting: Internal Medicine

## 2020-01-16 ENCOUNTER — Other Ambulatory Visit: Payer: Self-pay

## 2020-01-16 ENCOUNTER — Ambulatory Visit (INDEPENDENT_AMBULATORY_CARE_PROVIDER_SITE_OTHER): Payer: Medicare Other | Admitting: Internal Medicine

## 2020-01-16 VITALS — BP 132/72 | HR 64 | Temp 97.1°F | Resp 16 | Ht 66.0 in | Wt 174.8 lb

## 2020-01-16 DIAGNOSIS — Z1211 Encounter for screening for malignant neoplasm of colon: Secondary | ICD-10-CM

## 2020-01-16 DIAGNOSIS — E1122 Type 2 diabetes mellitus with diabetic chronic kidney disease: Secondary | ICD-10-CM

## 2020-01-16 DIAGNOSIS — Z8249 Family history of ischemic heart disease and other diseases of the circulatory system: Secondary | ICD-10-CM | POA: Diagnosis not present

## 2020-01-16 DIAGNOSIS — Z136 Encounter for screening for cardiovascular disorders: Secondary | ICD-10-CM

## 2020-01-16 DIAGNOSIS — Z79899 Other long term (current) drug therapy: Secondary | ICD-10-CM

## 2020-01-16 DIAGNOSIS — H548 Legal blindness, as defined in USA: Secondary | ICD-10-CM

## 2020-01-16 DIAGNOSIS — E559 Vitamin D deficiency, unspecified: Secondary | ICD-10-CM

## 2020-01-16 DIAGNOSIS — E083529 Diabetes mellitus due to underlying condition with proliferative diabetic retinopathy with traction retinal detachment involving the macula, unspecified eye: Secondary | ICD-10-CM

## 2020-01-16 DIAGNOSIS — Z794 Long term (current) use of insulin: Secondary | ICD-10-CM

## 2020-01-16 DIAGNOSIS — E1169 Type 2 diabetes mellitus with other specified complication: Secondary | ICD-10-CM

## 2020-01-16 DIAGNOSIS — E785 Hyperlipidemia, unspecified: Secondary | ICD-10-CM

## 2020-01-16 DIAGNOSIS — E039 Hypothyroidism, unspecified: Secondary | ICD-10-CM

## 2020-01-16 DIAGNOSIS — I700 Atherosclerosis of aorta: Secondary | ICD-10-CM | POA: Diagnosis not present

## 2020-01-16 DIAGNOSIS — Z940 Kidney transplant status: Secondary | ICD-10-CM

## 2020-01-16 DIAGNOSIS — N182 Chronic kidney disease, stage 2 (mild): Secondary | ICD-10-CM

## 2020-01-16 DIAGNOSIS — I10 Essential (primary) hypertension: Secondary | ICD-10-CM | POA: Diagnosis not present

## 2020-01-16 DIAGNOSIS — Z1212 Encounter for screening for malignant neoplasm of rectum: Secondary | ICD-10-CM

## 2020-01-17 ENCOUNTER — Other Ambulatory Visit: Payer: Self-pay | Admitting: Internal Medicine

## 2020-01-17 DIAGNOSIS — N170 Acute kidney failure with tubular necrosis: Secondary | ICD-10-CM

## 2020-01-17 DIAGNOSIS — N183 Chronic kidney disease, stage 3 unspecified: Secondary | ICD-10-CM

## 2020-01-17 NOTE — Progress Notes (Signed)
==========================================================  -    Kidney functions appear Dehydrated   - GFR has dropped down 50% from 65 to 41 - VERY IMPORTANT to drink at least 6 bottles (16 oz) of water or fluids /day to prevent permanent Kidney Damage  - Recommend recheck Nurse Visit & lab ( [facility] ) in 2 weeks  ==========================================================  - Chol = 168 and LDL = 79  Both  Excellent   - Very low risk for Heart Attack  / Stroke ============================================================= - A2c - Worse - A1c gone  up from 8.2% (which was too high )   to now A1c = 9.0% - which is even worse !   - Must get on a better diet or will lose eyesight completely & end up back on Dialysis ! ==========================================================  - vitamin D = 26 - Obviously not taking Vit D   _ Please take Vitamin D 5,000 unit capsule Daily  ==========================================================

## 2020-01-19 LAB — URINALYSIS, ROUTINE W REFLEX MICROSCOPIC
Bacteria, UA: NONE SEEN /HPF
Bilirubin Urine: NEGATIVE
Hgb urine dipstick: NEGATIVE
Hyaline Cast: NONE SEEN /LPF
Ketones, ur: NEGATIVE
Nitrite: NEGATIVE
Protein, ur: NEGATIVE
RBC / HPF: NONE SEEN /HPF (ref 0–2)
Specific Gravity, Urine: 1.022 (ref 1.001–1.03)
Squamous Epithelial / HPF: NONE SEEN /HPF (ref <=5)
pH: 6 (ref 5.0–8.0)

## 2020-01-19 LAB — CBC WITH DIFFERENTIAL/PLATELET
Absolute Monocytes: 511 cells/uL (ref 200–950)
Basophils Absolute: 21 cells/uL (ref 0–200)
Basophils Relative: 0.6 %
Eosinophils Absolute: 60 cells/uL (ref 15–500)
Eosinophils Relative: 1.7 %
HCT: 35.7 % (ref 35.0–45.0)
Hemoglobin: 11.1 g/dL — ABNORMAL LOW (ref 11.7–15.5)
Lymphs Abs: 1183 cells/uL (ref 850–3900)
MCH: 26.7 pg — ABNORMAL LOW (ref 27.0–33.0)
MCHC: 31.1 g/dL — ABNORMAL LOW (ref 32.0–36.0)
MCV: 85.8 fL (ref 80.0–100.0)
MPV: 12.2 fL (ref 7.5–12.5)
Monocytes Relative: 14.6 %
Neutro Abs: 1726 cells/uL (ref 1500–7800)
Neutrophils Relative %: 49.3 %
Platelets: 183 10*3/uL (ref 140–400)
RBC: 4.16 10*6/uL (ref 3.80–5.10)
RDW: 16.1 % — ABNORMAL HIGH (ref 11.0–15.0)
Total Lymphocyte: 33.8 %
WBC: 3.5 10*3/uL — ABNORMAL LOW (ref 3.8–10.8)

## 2020-01-19 LAB — COMPLETE METABOLIC PANEL WITH GFR
AG Ratio: 1.7 (calc) (ref 1.0–2.5)
ALT: 10 U/L (ref 6–29)
AST: 13 U/L (ref 10–35)
Albumin: 4.2 g/dL (ref 3.6–5.1)
Alkaline phosphatase (APISO): 76 U/L (ref 37–153)
BUN/Creatinine Ratio: 19 (calc) (ref 6–22)
BUN: 25 mg/dL (ref 7–25)
CO2: 21 mmol/L (ref 20–32)
Calcium: 9.9 mg/dL (ref 8.6–10.4)
Chloride: 110 mmol/L (ref 98–110)
Creat: 1.34 mg/dL — ABNORMAL HIGH (ref 0.50–0.99)
GFR, Est African American: 47 mL/min/{1.73_m2} — ABNORMAL LOW (ref >=60)
GFR, Est Non African American: 41 mL/min/{1.73_m2} — ABNORMAL LOW (ref >=60)
Globulin: 2.5 g/dL (calc) (ref 1.9–3.7)
Glucose, Bld: 251 mg/dL — ABNORMAL HIGH (ref 65–99)
Potassium: 4.7 mmol/L (ref 3.5–5.3)
Sodium: 143 mmol/L (ref 135–146)
Total Bilirubin: 0.3 mg/dL (ref 0.2–1.2)
Total Protein: 6.7 g/dL (ref 6.1–8.1)

## 2020-01-19 LAB — LIPID PANEL
Cholesterol: 168 mg/dL (ref <200)
HDL: 59 mg/dL (ref >=50)
LDL Cholesterol (Calc): 79 mg/dL (calc)
Non-HDL Cholesterol (Calc): 109 mg/dL (calc) (ref <130)
Total CHOL/HDL Ratio: 2.8 (calc) (ref <5.0)
Triglycerides: 205 mg/dL — ABNORMAL HIGH (ref <150)

## 2020-01-19 LAB — MICROALBUMIN / CREATININE URINE RATIO
Creatinine, Urine: 90 mg/dL (ref 20–275)
Microalb Creat Ratio: 13 mcg/mg creat (ref <30)
Microalb, Ur: 1.2 mg/dL

## 2020-01-19 LAB — TSH: TSH: 0.83 mIU/L (ref 0.40–4.50)

## 2020-01-19 LAB — INSULIN, RANDOM: Insulin: 23.2 u[IU]/mL — ABNORMAL HIGH

## 2020-01-19 LAB — MAGNESIUM: Magnesium: 2.1 mg/dL (ref 1.5–2.5)

## 2020-01-19 LAB — HEMOGLOBIN A1C
Hgb A1c MFr Bld: 9 % of total Hgb — ABNORMAL HIGH (ref <5.7)
Mean Plasma Glucose: 212 (calc)
eAG (mmol/L): 11.7 (calc)

## 2020-01-19 LAB — VITAMIN D 25 HYDROXY (VIT D DEFICIENCY, FRACTURES): Vit D, 25-Hydroxy: 26 ng/mL — ABNORMAL LOW (ref 30–100)

## 2020-02-04 ENCOUNTER — Ambulatory Visit: Payer: Medicare Other

## 2020-02-04 ENCOUNTER — Other Ambulatory Visit: Payer: Self-pay

## 2020-02-04 ENCOUNTER — Ambulatory Visit (INDEPENDENT_AMBULATORY_CARE_PROVIDER_SITE_OTHER): Payer: Medicare Other

## 2020-02-04 DIAGNOSIS — N183 Chronic kidney disease, stage 3 unspecified: Secondary | ICD-10-CM

## 2020-02-04 DIAGNOSIS — N170 Acute kidney failure with tubular necrosis: Secondary | ICD-10-CM

## 2020-02-05 LAB — COMPLETE METABOLIC PANEL WITH GFR
AG Ratio: 1.5 (calc) (ref 1.0–2.5)
ALT: 11 U/L (ref 6–29)
AST: 15 U/L (ref 10–35)
Albumin: 4 g/dL (ref 3.6–5.1)
Alkaline phosphatase (APISO): 80 U/L (ref 37–153)
BUN/Creatinine Ratio: 25 (calc) — ABNORMAL HIGH (ref 6–22)
BUN: 25 mg/dL (ref 7–25)
CO2: 22 mmol/L (ref 20–32)
Calcium: 9.9 mg/dL (ref 8.6–10.4)
Chloride: 108 mmol/L (ref 98–110)
Creat: 1.02 mg/dL — ABNORMAL HIGH (ref 0.50–0.99)
GFR, Est African American: 65 mL/min/{1.73_m2} (ref >=60)
GFR, Est Non African American: 56 mL/min/{1.73_m2} — ABNORMAL LOW (ref >=60)
Globulin: 2.6 g/dL (calc) (ref 1.9–3.7)
Glucose, Bld: 230 mg/dL — ABNORMAL HIGH (ref 65–99)
Potassium: 4.1 mmol/L (ref 3.5–5.3)
Sodium: 140 mmol/L (ref 135–146)
Total Bilirubin: 0.3 mg/dL (ref 0.2–1.2)
Total Protein: 6.6 g/dL (ref 6.1–8.1)

## 2020-02-05 NOTE — Progress Notes (Signed)
=========================================================  -    Kidney functions appear much better - almost back to baseliine   - So please continue to drink lots of water / fluids ==========================================================

## 2020-02-09 ENCOUNTER — Ambulatory Visit (HOSPITAL_COMMUNITY)
Admission: RE | Admit: 2020-02-09 | Discharge: 2020-02-09 | Disposition: A | Discharge status: Home / Self Care | Payer: Medicare Other | Source: Ambulatory Visit | Attending: Internal Medicine | Admitting: Internal Medicine

## 2020-02-09 ENCOUNTER — Other Ambulatory Visit: Payer: Self-pay

## 2020-02-09 DIAGNOSIS — Z940 Kidney transplant status: Secondary | ICD-10-CM | POA: Insufficient documentation

## 2020-02-09 MED ORDER — SODIUM CHLORIDE 0.9 % IV SOLN
312.5000 mg | INTRAVENOUS | Status: DC
  Administered 2020-02-09: 312.5 mg via INTRAVENOUS
  Filled 2020-02-09: qty 312.5

## 2020-02-10 ENCOUNTER — Other Ambulatory Visit: Payer: Self-pay | Admitting: Internal Medicine

## 2020-02-10 DIAGNOSIS — Z940 Kidney transplant status: Secondary | ICD-10-CM

## 2020-03-08 ENCOUNTER — Ambulatory Visit (HOSPITAL_COMMUNITY)
Admission: RE | Admit: 2020-03-08 | Discharge: 2020-03-08 | Disposition: A | Discharge status: Home / Self Care | Payer: Medicare Other | Source: Ambulatory Visit | Attending: Internal Medicine | Admitting: Internal Medicine

## 2020-03-08 ENCOUNTER — Other Ambulatory Visit: Payer: Self-pay

## 2020-03-08 DIAGNOSIS — Z940 Kidney transplant status: Secondary | ICD-10-CM | POA: Insufficient documentation

## 2020-03-08 MED ORDER — SODIUM CHLORIDE 0.9 % IV SOLN
312.5000 mg | INTRAVENOUS | Status: DC
  Administered 2020-03-08: 312.5 mg via INTRAVENOUS
  Filled 2020-03-08: qty 312.5

## 2020-03-12 ENCOUNTER — Other Ambulatory Visit: Payer: Self-pay

## 2020-03-12 ENCOUNTER — Encounter: Payer: Self-pay | Admitting: Podiatry

## 2020-03-12 ENCOUNTER — Ambulatory Visit (INDEPENDENT_AMBULATORY_CARE_PROVIDER_SITE_OTHER): Payer: Medicare Other | Admitting: Podiatry

## 2020-03-12 DIAGNOSIS — B351 Tinea unguium: Secondary | ICD-10-CM | POA: Diagnosis not present

## 2020-03-12 DIAGNOSIS — M79675 Pain in left toe(s): Secondary | ICD-10-CM | POA: Diagnosis not present

## 2020-03-12 DIAGNOSIS — M79674 Pain in right toe(s): Secondary | ICD-10-CM

## 2020-03-12 DIAGNOSIS — E1142 Type 2 diabetes mellitus with diabetic polyneuropathy: Secondary | ICD-10-CM

## 2020-03-12 NOTE — Progress Notes (Signed)
This patient returns to my office for at risk foot care.  This patient requires this care by a professional since this patient will be at risk due to having diabetic neuropathy.  Patient doing well since her kidney transplant.  This patient is unable to cut her  nails  since the patient cannot reach her nails.These nails are painful walking and wearing shoes.  This patient presents for at risk foot care today.  General Appearance  Alert, conversant and in no acute stress.  Vascular  Dorsalis pedis and posterior tibial  pulses are palpable  bilaterally.  Capillary return is within normal limits  bilaterally. Temperature is within normal limits  bilaterally.  Neurologic  Senn-Weinstein monofilament wire test diminished   bilaterally. Muscle power within normal limits bilaterally.  Nails Thick disfigured discolored nails with subungual debris  from hallux to fifth toes bilaterally. No evidence of bacterial infection or drainage bilaterally.  Orthopedic  No limitations of motion  feet .  No crepitus or effusions noted.  No bony pathology or digital deformities noted.  Skin  normotropic skin with no porokeratosis noted bilaterally.  No signs of infections or ulcers noted.     Onychomycosis  Pain in right toes  Pain in left toes  Consent was obtained for treatment procedures.   Mechanical debridement of nails 1-5  bilaterally performed with a nail nipper.  Filed with dremel without incident.    Return office visit  3 months                      Told patient to return for periodic foot care and evaluation due to potential at risk complications.   Gardiner Barefoot DPM

## 2020-03-17 LAB — HM DIABETES EYE EXAM

## 2020-04-05 ENCOUNTER — Ambulatory Visit (HOSPITAL_COMMUNITY)
Admission: RE | Admit: 2020-04-05 | Discharge: 2020-04-05 | Disposition: A | Discharge status: Home / Self Care | Payer: Medicare Other | Source: Ambulatory Visit | Attending: Internal Medicine | Admitting: Internal Medicine

## 2020-04-05 ENCOUNTER — Other Ambulatory Visit: Payer: Self-pay

## 2020-04-05 DIAGNOSIS — Z940 Kidney transplant status: Secondary | ICD-10-CM | POA: Diagnosis not present

## 2020-04-05 MED ORDER — SODIUM CHLORIDE 0.9 % IV SOLN
312.5000 mg | INTRAVENOUS | Status: DC
  Administered 2020-04-05: 312.5 mg via INTRAVENOUS
  Filled 2020-04-05: qty 312.5

## 2020-04-08 ENCOUNTER — Other Ambulatory Visit: Payer: Self-pay | Admitting: Internal Medicine

## 2020-04-08 DIAGNOSIS — E1169 Type 2 diabetes mellitus with other specified complication: Secondary | ICD-10-CM

## 2020-04-08 MED ORDER — GEMFIBROZIL 600 MG PO TABS: ORAL_TABLET | ORAL | 0 refills | Status: DC

## 2020-04-20 ENCOUNTER — Ambulatory Visit: Payer: Medicare Other | Admitting: Adult Health

## 2020-04-21 ENCOUNTER — Encounter: Payer: Self-pay | Admitting: Adult Health

## 2020-04-21 ENCOUNTER — Other Ambulatory Visit: Payer: Self-pay

## 2020-04-21 ENCOUNTER — Ambulatory Visit (INDEPENDENT_AMBULATORY_CARE_PROVIDER_SITE_OTHER): Payer: Medicare Other | Admitting: Adult Health

## 2020-04-21 VITALS — BP 128/62 | HR 71 | Temp 97.3°F | Wt 174.0 lb

## 2020-04-21 DIAGNOSIS — E782 Mixed hyperlipidemia: Secondary | ICD-10-CM | POA: Diagnosis not present

## 2020-04-21 DIAGNOSIS — I700 Atherosclerosis of aorta: Secondary | ICD-10-CM

## 2020-04-21 DIAGNOSIS — Z79899 Other long term (current) drug therapy: Secondary | ICD-10-CM | POA: Diagnosis not present

## 2020-04-21 DIAGNOSIS — E083529 Diabetes mellitus due to underlying condition with proliferative diabetic retinopathy with traction retinal detachment involving the macula, unspecified eye: Secondary | ICD-10-CM

## 2020-04-21 DIAGNOSIS — I10 Essential (primary) hypertension: Secondary | ICD-10-CM | POA: Diagnosis not present

## 2020-04-21 DIAGNOSIS — E1142 Type 2 diabetes mellitus with diabetic polyneuropathy: Secondary | ICD-10-CM

## 2020-04-21 DIAGNOSIS — E039 Hypothyroidism, unspecified: Secondary | ICD-10-CM | POA: Diagnosis not present

## 2020-04-21 DIAGNOSIS — E1136 Type 2 diabetes mellitus with diabetic cataract: Secondary | ICD-10-CM

## 2020-04-21 DIAGNOSIS — R197 Diarrhea, unspecified: Secondary | ICD-10-CM

## 2020-04-21 DIAGNOSIS — E559 Vitamin D deficiency, unspecified: Secondary | ICD-10-CM

## 2020-04-21 NOTE — Patient Instructions (Signed)
Can take pepto bismol for upset stomach and diarrhea  If having persistent liquid stools let me know -   PUSH fluids, may need to do pedialyte or sugar free gatorade if you have more diarrhea or throwing up    Diarrhea, Adult Diarrhea is frequent loose and watery bowel movements. Diarrhea can make you feel weak and cause you to become dehydrated. Dehydration can make you tired and thirsty, cause you to have a dry mouth, and decrease how often you urinate. Diarrhea typically lasts 2-3 days. However, it can last longer if it is a sign of something more serious. It is important to treat your diarrhea as told by your health care provider. Follow these instructions at home: Eating and drinking     Follow these recommendations as told by your health care provider:  Take an oral rehydration solution (ORS). This is an over-the-counter medicine that helps return your body to its normal balance of nutrients and water. It is found at pharmacies and retail stores.  Drink plenty of fluids, such as water, ice chips, diluted fruit juice, and low-calorie sports drinks. You can drink milk also, if desired.  Avoid drinking fluids that contain a lot of sugar or caffeine, such as energy drinks, sports drinks, and soda.  Eat bland, easy-to-digest foods in small amounts as you are able. These foods include bananas, applesauce, rice, lean meats, toast, and crackers.  Avoid alcohol.  Avoid spicy or fatty foods.  Medicines  Take over-the-counter and prescription medicines only as told by your health care provider.  If you were prescribed an antibiotic medicine, take it as told by your health care provider. Do not stop using the antibiotic even if you start to feel better. General instructions   Wash your hands often using soap and water. If soap and water are not available, use a hand sanitizer. Others in the household should wash their hands as well. Hands should be washed: ? After using the toilet or  changing a diaper. ? Before preparing, cooking, or serving food. ? While caring for a sick person or while visiting someone in a hospital.  Drink enough fluid to keep your urine pale yellow.  Rest at home while you recover.  Watch your condition for any changes.  Take a warm bath to relieve any burning or pain from frequent diarrhea episodes.  Keep all follow-up visits as told by your health care provider. This is important. Contact a health care provider if:  You have a fever.  Your diarrhea gets worse.  You have new symptoms.  You cannot keep fluids down.  You feel light-headed or dizzy.  You have a headache.  You have muscle cramps. Get help right away if:  You have chest pain.  You feel extremely weak or you faint.  You have bloody or black stools or stools that look like tar.  You have severe pain, cramping, or bloating in your abdomen.  You have trouble breathing or you are breathing very quickly.  Your heart is beating very quickly.  Your skin feels cold and clammy.  You feel confused.  You have signs of dehydration, such as: ? Dark urine, very little urine, or no urine. ? Cracked lips. ? Dry mouth. ? Sunken eyes. ? Sleepiness. ? Weakness. Summary  Diarrhea is frequent loose and watery bowel movements. Diarrhea can make you feel weak and cause you to become dehydrated.  Drink enough fluids to keep your urine pale yellow.  Make sure that you wash your  hands after using the toilet. If soap and water are not available, use hand sanitizer.  Contact a health care provider if your diarrhea gets worse or you have new symptoms.  Get help right away if you have signs of dehydration. This information is not intended to replace advice given to you by your health care provider. Make sure you discuss any questions you have with your health care provider. Document Revised: 09/03/2018 Document Reviewed: 09/21/2017 Elsevier Patient Education  [place].

## 2020-04-21 NOTE — Progress Notes (Signed)
3 MONTH FOLLOW UP Assessment:     Essential hypertension Continue medication: Monitor blood pressure at home; call if consistently over 130/80 Continue DASH diet.   Reminder to go to the ER if any CP, SOB, nausea, dizziness, severe HA, changes vision/speech, left arm numbness and tingling and jaw pain. -     CBC with Differential/Platelet -     COMPLETE METABOLIC PANEL WITH GFR  Hyperlipidemia associated with type 2 diabetes mellitus (HCC) Continue medications Discussed dietary and exercise modifications Low fat diet -     Lipid panel  End stage renal disease on dialysis due to type 2 diabetes mellitus ([place]) Kidney replaced by transplant Sirolimus daily  Type 2 diabetes mellitus with stage 2 chronic kidney disease, with long-term current use of insulin (HCC) Diabetes mellitus with cataract ([place]) Follows with [facility] Lantus 23 units daily, humalog sliding scale PRN Tradjenta 5mg  Freestyle libre Reminded her needs to eat small regular meals Follow up with endocrinology if labile glucose not improving  Hypothyroidism due to Hashimoto's thyroiditis Taking levothyroxine 125mg  5 days a week. Reminder to take on an empty stomach 30-37mins before first meal of the day. No antacid medications for 4 hours. Follows with Endocrinology  Aortic atherosclerosis ([place]) Control blood pressure, lipids and glucose Disscused lifestyle modifications, diet & exercise Continue to monitor  Primary osteoarthritis involving multiple joints Ortho following, steroid injections, likely need replacement soon  Vitamin D deficiency Start supplement for goal of 60-100  Legally blind Discussed safety Routine opthalmology evaluations Q26months Husband assists patient  Medication management Continued  Body mass index (BMI) of 28 Discussed dietary and exercise modifications  Diarrhea/nausea Check CBC, CMP Did have negative covid 19  She reports sx seem to be  improving Otherwise benign exam ? Mild gastroenteritis Push fluids, pedialyte or sugar free gatorade if needed ED if HA, cramps, syncope, confusion or unable to keep water down  Declines nausea med, try pepto bismol  Follow up tomorrow AM,    Follow Up Instructions:    I discussed the assessment and treatment plan with the patient. The patient was provided an opportunity to ask questions and all were answered. The patient agreed with the plan and demonstrated an understanding of the instructions.   The patient was advised to call back or seek an in-person evaluation if the symptoms worsen or if the condition fails to improve as anticipated.  I provided 30 minutes of face-to-face time during this encounter including counseling, chart review, and critical decision making was preformed.   Future Appointments  Date Time Provider Denise Bush  07/09/2020  9:45 AM Gardiner Barefoot, DPM [facility]  01/20/2021 11:00 AM Unk Pinto, MD [facility] None     Subjective:  Denise Bush is a 69 y.o. female who presents for Medicare Annual Wellness Visit and 3 month follow up for HTN, HLD,DMII, legally blind, hypothyroidism, ESRD and now S/P recipient of kidney transplant and vitamin D Def.     She reports this AM started having watery diarrhea, has had 5 episodes of diarrhea since, has had vague discomfort of upper abdomen, gurgling, bloated. Denies fever/chills, does endorse nausea, had 1 episode of emesis, she reports starting to feel better in the last hour. She reports hx of similar episodes occasionally, typically resolve in a day.   Denies HA, dizziness, URI sx, rash, arthralgia/myalgia.   Denies new foods, husband has been eating same foods without sx, denies abx in the last 6 months. Denies     She follows  with Dr Lynnette Caffey at [facility] s/p kidney transplant in 12/2017.  3 weeks ago she reports that her Sirolimus was increased to 2 MG/ day.  She follows with  endocrinology Dr. Renie Ora and her last OV was 04/02/2020 with A1C of 8.6%.  Lantus was increased from 20 to 23 units, reports has improved, but continues to have atypically labile fasting glucose, ranges 90-200+. She has freestyle libre, checking QID. Does have humalog, takes 3 units if 200+, 4 units 250+ PRN only.   She is seeing Orthopedics for knee and hip injections, she gets steroid and gel injections that do cause glucose to be fairly elevated and labile.    BMI is Body mass index is 28.08 kg/m., she has been working on diet, admits minimal exercise due to hip and knee pain.  Wt Readings from Last 3 Encounters:  04/21/20 174 lb (78.9 kg)  04/05/20 170 lb (77.1 kg)  03/08/20 170 lb (77.1 kg)   Her blood pressure has been controlled at home.  She is unable to read the numbers on the machine.  Her husband usually does this for her. Today their BP is BP: 128/62 She does workout. She denies chest pain, shortness of breath, dizziness.   Aortic atherosclerosis per CT abd 2017.   She is on cholesterol medication (zetia and lopid, statin intolerance, myalgias and weakness) and denies myalgias. Her cholesterol is not at goal. The cholesterol last visit was:   Lab Results  Component Value Date   CHOL 168 01/16/2020   HDL 59 01/16/2020   LDLCALC 79 01/16/2020   TRIG 205 (H) 01/16/2020   CHOLHDL 2.8 01/16/2020   She has been working on diet and exercise for DMII, and denies foot ulcerations, hypoglycemia , increased appetite, nausea, polydipsia, polyuria, vomiting and weight loss. She has bil foot tingling. Does follow with podiatry. She follows with endocrinology at [facility] and last A1c was 8.6% on 04/02/2020, increased to lantus 23 units. Last A1C in the office was:  Lab Results  Component Value Date   HGBA1C 9.0 (H) 01/16/2020   S/p renal transplant, Last GFR Lab Results  Component Value Date   GFRAA 65 02/04/2020   Patient is not on Vitamin D supplement for deficiency despite  recommendation Lab Results  Component Value Date   VD25OH 26 (L) 01/16/2020     Patient has hypothyroidism and on replacement since 1971.  Currently taking levothyroxine 118mcg six days a week, skip one day.  Her medication was not changed last visit. TSH was just checked 04/02/2020 at [facility], skip today.  Lab Results  Component Value Date   TSH 0.83 01/16/2020     Medication Review:  Current Outpatient Medications (Endocrine & Metabolic):  .  insulin glargine (LANTUS) 100 UNIT/ML injection, Inject 18 Units into the skin daily.  Marland Kitchen  levothyroxine (SYNTHROID) 125 MCG tablet, Take 125 mcg by mouth. Takes 1 tablet daily except on Sundays. Marland Kitchen  linagliptin (TRADJENTA) 5 MG TABS tablet, Take by mouth.  Current Outpatient Medications (Cardiovascular):  .  amLODipine (NORVASC) 2.5 MG tablet, Take 5 mg by mouth daily. .  carvedilol (COREG) 6.25 MG tablet, Take 6.25 mg by mouth 2 (two) times daily with a meal. .  ezetimibe (ZETIA) 10 MG tablet, Take 1 tablet (10 mg total) by mouth daily. Marland Kitchen  gemfibrozil (LOPID) 600 MG tablet, Take 1 tablet 2 x /day for Triglycerides (Blood Fats)   Current Outpatient Medications (Analgesics):  .  aspirin EC 81 MG tablet, Take 81 mg by mouth daily.  Current Outpatient Medications (Other):  .  belatacept (NULOJIX) 250 MG SOLR injection, Inject into the vein. .  Continuous Blood Gluc Receiver (FREESTYLE LIBRE 14 DAY READER) DEVI, Use 1 Application as directed .  Continuous Blood Gluc Sensor (FREESTYLE LIBRE 14 DAY SENSOR) MISC, Place 1 Device onto the skin every 14 (fourteen) days. DX-E11.22,n18.6,Z99.2 .  glucose blood test strip, 1 each. .  Insulin Pen Needle (FIFTY50 PEN NEEDLES) 31G X 5 MM MISC, Test sugars Three to four times daily .  Lancets Misc. (UNISTIK 2 NORMAL) MISC, Use 1 each 4 (four) times daily Product selection permitted according to insurance preference. E11.9 Type 2 diabetes mellitus .  sirolimus (RAPAMUNE) 1 MG tablet, Take    1 tablet     Daily      for Kidney Transplant (Patient taking differently: Take 2 tablet     Daily     for Kidney Transplant)  Allergies: Allergies  Allergen Reactions  . Hydralazine Shortness Of Breath    Chest pain fatigue Chest pain  . Azor [Amlodipine-Olmesartan] Other (See Comments)    Sharp chest pain  Sharp chest pain  Chest pain  . Hydrochlorothiazide W-Triamterene   . Labetalol     Per patient, caused foot pain  . Lisinopril Other (See Comments)    Severe radiating foot pain.  . Metformin Itching    Loss of bowel control, general sick feeling  . Metoclopramide Other (See Comments)  . Other Other (See Comments)    "contractions in throat" Diazide pt states cause throat to swell.  . Minoxidil Rash    Choking, neck contractions fatigue  . Spironolactone Other (See Comments) and Rash    Severe radiating pain in feet Severe radiating pain in feet.  . Statins Rash    Fatigue Cause fatigue    Current Problems (verified) has Hypothyroidism; Essential hypertension; Allergic rhinitis; Mixed hyperlipidemia; Vitamin D deficiency; Medication management; Diabetic proliferative retinopathy ([place]); Arthritis, degenerative; Body mass index (BMI) of 24.0 to 24.9 in adult; End stage renal disease on dialysis due to type 2 diabetes mellitus ([place]); Hyperparathyroidism, secondary renal ([place]); Diabetes mellitus with cataract ([place]); Kidney replaced by transplant; Pain due to onychomycosis of toenails of both feet; and Diabetic neuropathy ([place]) on their problem list.  Surgical: She  has a past surgical history that includes Eye surgery; Abdominal hysterectomy; Knee surgery; Colonoscopy; and Breast biopsy (Right, 2015). Family Her family history includes Breast cancer in her maternal aunt, maternal aunt, maternal grandmother, and sister; CAD in an other family member; Colon polyps in her maternal aunt and maternal uncle. Social history  She reports that she has never smoked. She has never used smokeless tobacco.  She reports that she does not drink alcohol. No history on file for drug use.   Review of Systems  Constitutional: Negative for malaise/fatigue and weight loss.  HENT: Negative for hearing loss and tinnitus.   Eyes: Negative for blurred vision and double vision.  Respiratory: Negative for cough, shortness of breath and wheezing.   Cardiovascular: Negative for chest pain, palpitations, orthopnea, claudication and leg swelling.  Gastrointestinal: Positive for diarrhea and nausea. Negative for abdominal pain, blood in stool, constipation, heartburn, melena and vomiting.  Genitourinary: Negative.   Musculoskeletal: Negative for joint pain and myalgias.  Skin: Negative for rash.  Neurological: Negative for dizziness, tingling, sensory change, weakness and headaches.  Endo/Heme/Allergies: Negative for polydipsia.  Psychiatric/Behavioral: Negative.   All other systems reviewed and are negative.    Objective:   Today's Vitals  04/21/20 1606  BP: 128/62  Pulse: 71  Temp: (!) 97.3 F (36.3 C)  SpO2: 99%  Weight: 174 lb (78.9 kg)   Body mass index is 28.08 kg/m.   General Appearance: Well nourished, in no apparent distress. Eyes: PERRLA, EOMs, conjunctiva no swelling or erythema Sinuses: No Frontal/maxillary tenderness ENT/Mouth: Ext aud canals clear, TMs without erythema, bulging. No erythema, swelling, or exudate on post pharynx.  Tonsils not swollen or erythematous. Hearing normal.  Neck: Supple, thyroid normal.  Respiratory: Respiratory effort normal, BS equal bilaterally without rales, rhonchi, wheezing or stridor.  Cardio: RRR with no MRGs. Brisk peripheral pulses without edema.  Abdomen: Soft, + BS.  Non tender, no guarding, rebound, hernias, masses. Lymphatics: Non tender without lymphadenopathy.  Musculoskeletal: Full ROM, 5/5 strength, Normal gait Skin: Warm, dry without rashes, lesions, ecchymosis.  Neuro: Cranial nerves intact. No cerebellar symptoms.  Psych: Awake  and oriented X 3, normal affect, Insight and Judgment appropriate.    Izora Ribas, NP 4:25 PM [facility]

## 2020-04-23 LAB — COMPLETE METABOLIC PANEL WITH GFR
AG Ratio: 1.6 (calc) (ref 1.0–2.5)
ALT: 10 U/L (ref 6–29)
AST: 16 U/L (ref 10–35)
Albumin: 4.5 g/dL (ref 3.6–5.1)
Alkaline phosphatase (APISO): 89 U/L (ref 37–153)
BUN/Creatinine Ratio: 24 (calc) — ABNORMAL HIGH (ref 6–22)
BUN: 26 mg/dL — ABNORMAL HIGH (ref 7–25)
CO2: 24 mmol/L (ref 20–32)
Calcium: 10.5 mg/dL — ABNORMAL HIGH (ref 8.6–10.4)
Chloride: 113 mmol/L — ABNORMAL HIGH (ref 98–110)
Creat: 1.08 mg/dL — ABNORMAL HIGH (ref 0.50–0.99)
GFR, Est African American: 61 mL/min/{1.73_m2} (ref >=60)
GFR, Est Non African American: 53 mL/min/{1.73_m2} — ABNORMAL LOW (ref >=60)
Globulin: 2.9 g/dL (calc) (ref 1.9–3.7)
Glucose, Bld: 169 mg/dL — ABNORMAL HIGH (ref 65–99)
Potassium: 5 mmol/L (ref 3.5–5.3)
Sodium: 145 mmol/L (ref 135–146)
Total Bilirubin: 0.3 mg/dL (ref 0.2–1.2)
Total Protein: 7.4 g/dL (ref 6.1–8.1)

## 2020-04-23 LAB — LIPID PANEL
Cholesterol: 172 mg/dL (ref <200)
HDL: 62 mg/dL (ref >=50)
LDL Cholesterol (Calc): 87 mg/dL (calc)
Non-HDL Cholesterol (Calc): 110 mg/dL (calc) (ref <130)
Total CHOL/HDL Ratio: 2.8 (calc) (ref <5.0)
Triglycerides: 136 mg/dL (ref <150)

## 2020-04-23 LAB — CBC WITH DIFFERENTIAL/PLATELET
Absolute Monocytes: 388 cells/uL (ref 200–950)
Basophils Absolute: 19 cells/uL (ref 0–200)
Basophils Relative: 0.5 %
Eosinophils Absolute: 38 cells/uL (ref 15–500)
Eosinophils Relative: 1 %
HCT: 37.9 % (ref 35.0–45.0)
Hemoglobin: 12 g/dL (ref 11.7–15.5)
Lymphs Abs: 1003 cells/uL (ref 850–3900)
MCH: 26.1 pg — ABNORMAL LOW (ref 27.0–33.0)
MCHC: 31.7 g/dL — ABNORMAL LOW (ref 32.0–36.0)
MCV: 82.6 fL (ref 80.0–100.0)
MPV: 10.9 fL (ref 7.5–12.5)
Monocytes Relative: 10.2 %
Neutro Abs: 2352 cells/uL (ref 1500–7800)
Neutrophils Relative %: 61.9 %
Platelets: 184 10*3/uL (ref 140–400)
RBC: 4.59 10*6/uL (ref 3.80–5.10)
RDW: 16.2 % — ABNORMAL HIGH (ref 11.0–15.0)
Total Lymphocyte: 26.4 %
WBC: 3.8 10*3/uL (ref 3.8–10.8)

## 2020-04-23 LAB — IRON,TIBC AND FERRITIN PANEL
%SAT: 21 % (calc) (ref 16–45)
Ferritin: 839 ng/mL — ABNORMAL HIGH (ref 16–288)
Iron: 58 ug/dL (ref 45–160)
TIBC: 281 mcg/dL (calc) (ref 250–450)

## 2020-04-23 LAB — TEST AUTHORIZATION

## 2020-04-23 LAB — MAGNESIUM: Magnesium: 2.1 mg/dL (ref 1.5–2.5)

## 2020-04-29 ENCOUNTER — Other Ambulatory Visit: Payer: Self-pay

## 2020-04-29 MED ORDER — AMLODIPINE BESYLATE 2.5 MG PO TABS: ORAL_TABLET | ORAL | 0 refills | Status: DC

## 2020-05-03 ENCOUNTER — Ambulatory Visit (HOSPITAL_COMMUNITY): Payer: Medicare Other

## 2020-05-04 ENCOUNTER — Other Ambulatory Visit: Payer: Self-pay | Admitting: Internal Medicine

## 2020-05-04 DIAGNOSIS — Z0000 Encounter for general adult medical examination without abnormal findings: Secondary | ICD-10-CM

## 2020-05-06 ENCOUNTER — Other Ambulatory Visit (HOSPITAL_COMMUNITY): Payer: Self-pay | Admitting: *Deleted

## 2020-05-07 ENCOUNTER — Other Ambulatory Visit: Payer: Self-pay

## 2020-05-07 ENCOUNTER — Ambulatory Visit (HOSPITAL_COMMUNITY)
Admission: RE | Admit: 2020-05-07 | Discharge: 2020-05-07 | Disposition: A | Discharge status: Home / Self Care | Payer: Medicare PPO | Source: Ambulatory Visit | Attending: Internal Medicine | Admitting: Internal Medicine

## 2020-05-07 DIAGNOSIS — Z940 Kidney transplant status: Secondary | ICD-10-CM | POA: Diagnosis not present

## 2020-05-07 MED ORDER — SODIUM CHLORIDE 0.9 % IV SOLN
5.0000 mg/kg | INTRAVENOUS | Status: DC
  Administered 2020-05-07: 400 mg via INTRAVENOUS
  Filled 2020-05-07: qty 400

## 2020-05-10 ENCOUNTER — Other Ambulatory Visit: Payer: Self-pay

## 2020-05-10 MED ORDER — FREESTYLE LIBRE 14 DAY SENSOR MISC: 1.0000 | 1 refills | Status: DC

## 2020-06-03 ENCOUNTER — Other Ambulatory Visit: Payer: Self-pay

## 2020-06-03 ENCOUNTER — Ambulatory Visit (HOSPITAL_COMMUNITY)
Admission: RE | Admit: 2020-06-03 | Discharge: 2020-06-03 | Disposition: A | Discharge status: Home / Self Care | Payer: Medicare PPO | Source: Ambulatory Visit | Attending: Internal Medicine | Admitting: Internal Medicine

## 2020-06-03 DIAGNOSIS — Z940 Kidney transplant status: Secondary | ICD-10-CM | POA: Diagnosis not present

## 2020-06-03 MED ORDER — SODIUM CHLORIDE 0.9 % IV SOLN
5.0000 mg/kg | INTRAVENOUS | Status: DC
  Filled 2020-06-03: qty 387.5

## 2020-06-03 MED ORDER — SODIUM CHLORIDE 0.9 % IV SOLN
5.0000 mg/kg | INTRAVENOUS | Status: DC
  Administered 2020-06-03: 387.5 mg via INTRAVENOUS
  Filled 2020-06-03: qty 387.5

## 2020-06-10 ENCOUNTER — Other Ambulatory Visit: Payer: Self-pay

## 2020-06-10 ENCOUNTER — Ambulatory Visit
Admission: RE | Admit: 2020-06-10 | Discharge: 2020-06-10 | Disposition: A | Discharge status: Home / Self Care | Payer: Medicare PPO | Source: Ambulatory Visit | Attending: Internal Medicine | Admitting: Internal Medicine

## 2020-06-10 DIAGNOSIS — Z0000 Encounter for general adult medical examination without abnormal findings: Secondary | ICD-10-CM

## 2020-06-10 DIAGNOSIS — Z1231 Encounter for screening mammogram for malignant neoplasm of breast: Secondary | ICD-10-CM | POA: Diagnosis not present

## 2020-06-11 DIAGNOSIS — E109 Type 1 diabetes mellitus without complications: Secondary | ICD-10-CM | POA: Diagnosis not present

## 2020-06-11 DIAGNOSIS — Z794 Long term (current) use of insulin: Secondary | ICD-10-CM | POA: Diagnosis not present

## 2020-06-23 DIAGNOSIS — Z940 Kidney transplant status: Secondary | ICD-10-CM | POA: Diagnosis not present

## 2020-06-23 DIAGNOSIS — Z5181 Encounter for therapeutic drug level monitoring: Secondary | ICD-10-CM | POA: Diagnosis not present

## 2020-07-01 ENCOUNTER — Ambulatory Visit (HOSPITAL_COMMUNITY)
Admission: RE | Admit: 2020-07-01 | Discharge: 2020-07-01 | Disposition: A | Discharge status: Home / Self Care | Payer: Medicare PPO | Source: Ambulatory Visit | Attending: Internal Medicine | Admitting: Internal Medicine

## 2020-07-01 ENCOUNTER — Other Ambulatory Visit: Payer: Self-pay

## 2020-07-01 DIAGNOSIS — Z940 Kidney transplant status: Secondary | ICD-10-CM | POA: Diagnosis not present

## 2020-07-01 DIAGNOSIS — Z5181 Encounter for therapeutic drug level monitoring: Secondary | ICD-10-CM | POA: Diagnosis not present

## 2020-07-01 LAB — CBC WITH DIFFERENTIAL/PLATELET
Abs Immature Granulocytes: 0.1 10*3/uL — ABNORMAL HIGH (ref 0.00–0.07)
Basophils Absolute: 0 10*3/uL (ref 0.0–0.1)
Basophils Relative: 1 %
Eosinophils Absolute: 0.1 10*3/uL (ref 0.0–0.5)
Eosinophils Relative: 2 %
HCT: 37.1 % (ref 36.0–46.0)
Hemoglobin: 11.2 g/dL — ABNORMAL LOW (ref 12.0–15.0)
Immature Granulocytes: 3 %
Lymphocytes Relative: 31 %
Lymphs Abs: 1 10*3/uL (ref 0.7–4.0)
MCH: 24.9 pg — ABNORMAL LOW (ref 26.0–34.0)
MCHC: 30.2 g/dL (ref 30.0–36.0)
MCV: 82.6 fL (ref 80.0–100.0)
Monocytes Absolute: 0.4 10*3/uL (ref 0.1–1.0)
Monocytes Relative: 13 %
Neutro Abs: 1.7 10*3/uL (ref 1.7–7.7)
Neutrophils Relative %: 50 %
Platelets: 236 10*3/uL (ref 150–400)
RBC: 4.49 MIL/uL (ref 3.87–5.11)
RDW: 18.4 % — ABNORMAL HIGH (ref 11.5–15.5)
WBC: 3.4 10*3/uL — ABNORMAL LOW (ref 4.0–10.5)
nRBC: 0 % (ref 0.0–0.2)

## 2020-07-01 LAB — BASIC METABOLIC PANEL
Anion gap: 8 (ref 5–15)
BUN: 16 mg/dL (ref 8–23)
CO2: 21 mmol/L — ABNORMAL LOW (ref 22–32)
Calcium: 9.9 mg/dL (ref 8.9–10.3)
Chloride: 113 mmol/L — ABNORMAL HIGH (ref 98–111)
Creatinine, Ser: 0.98 mg/dL (ref 0.44–1.00)
GFR, Estimated: >60 mL/min (ref >60)
Glucose, Bld: 173 mg/dL — ABNORMAL HIGH (ref 70–99)
Potassium: 4.4 mmol/L (ref 3.5–5.1)
Sodium: 142 mmol/L (ref 135–145)

## 2020-07-01 LAB — URINALYSIS, COMPLETE (UACMP) WITH MICROSCOPIC
Bacteria, UA: NONE SEEN
Bilirubin Urine: NEGATIVE
Glucose, UA: NEGATIVE mg/dL
Hgb urine dipstick: NEGATIVE
Ketones, ur: NEGATIVE mg/dL
Nitrite: NEGATIVE
Protein, ur: NEGATIVE mg/dL
Specific Gravity, Urine: 1.017 (ref 1.005–1.030)
pH: 5 (ref 5.0–8.0)

## 2020-07-01 LAB — PROTEIN / CREATININE RATIO, URINE
Creatinine, Urine: 123.93 mg/dL
Protein Creatinine Ratio: 0.1 mg/mg{Cre} (ref 0.00–0.15)
Total Protein, Urine: 13 mg/dL

## 2020-07-01 MED ORDER — SODIUM CHLORIDE 0.9 % IV SOLN
5.0000 mg/kg | INTRAVENOUS | Status: DC
  Administered 2020-07-01: 387.5 mg via INTRAVENOUS
  Filled 2020-07-01: qty 387.5

## 2020-07-02 LAB — CMV DNA, QUANTITATIVE, PCR
CMV DNA Quant: NEGATIVE IU/mL
Log10 CMV Qn DNA Pl: UNDETERMINED log10 IU/mL

## 2020-07-02 LAB — BK QUANT PCR (PLASMA/SERUM)
BK Quantitaion PCR: 134 IU/mL
Log10 BK Qn PCR: 2.127 log10 IU/mL

## 2020-07-05 LAB — SIROLIMUS LEVEL: Sirolimus (Rapamycin): 4.8 ng/mL (ref 3.0–20.0)

## 2020-07-09 ENCOUNTER — Other Ambulatory Visit: Payer: Self-pay

## 2020-07-09 ENCOUNTER — Encounter: Payer: Self-pay | Admitting: Podiatry

## 2020-07-09 ENCOUNTER — Ambulatory Visit (INDEPENDENT_AMBULATORY_CARE_PROVIDER_SITE_OTHER): Payer: Medicare PPO | Admitting: Podiatry

## 2020-07-09 DIAGNOSIS — B351 Tinea unguium: Secondary | ICD-10-CM | POA: Diagnosis not present

## 2020-07-09 DIAGNOSIS — E1142 Type 2 diabetes mellitus with diabetic polyneuropathy: Secondary | ICD-10-CM

## 2020-07-09 DIAGNOSIS — M79674 Pain in right toe(s): Secondary | ICD-10-CM

## 2020-07-09 DIAGNOSIS — M79675 Pain in left toe(s): Secondary | ICD-10-CM | POA: Diagnosis not present

## 2020-07-09 NOTE — Progress Notes (Signed)
This patient returns to my office for at risk foot care.  This patient requires this care by a professional since this patient will be at risk due to having diabetic neuropathy.  Patient doing well since her kidney transplant.  This patient is unable to cut her  nails  since the patient cannot reach her nails.These nails are painful walking and wearing shoes.  This patient presents for at risk foot care today.  General Appearance  Alert, conversant and in no acute stress.  Vascular  Dorsalis pedis and posterior tibial  pulses are palpable  bilaterally.  Capillary return is within normal limits  bilaterally. Temperature is within normal limits  bilaterally.  Neurologic  Senn-Weinstein monofilament wire test diminished   bilaterally. Muscle power within normal limits bilaterally.  Nails Thick disfigured discolored nails with subungual debris  from hallux to fifth toes bilaterally. No evidence of bacterial infection or drainage bilaterally.  Orthopedic  No limitations of motion  feet .  No crepitus or effusions noted.  No bony pathology or digital deformities noted.  Skin  normotropic skin with no porokeratosis noted bilaterally.  No signs of infections or ulcers noted.     Onychomycosis  Pain in right toes  Pain in left toes  Consent was obtained for treatment procedures.   Mechanical debridement of nails 1-5  bilaterally performed with a nail nipper.  Filed with dremel without incident.    Return office visit  4 months                      Told patient to return for periodic foot care and evaluation due to potential at risk complications.   Gardiner Barefoot DPM

## 2020-07-11 DIAGNOSIS — E109 Type 1 diabetes mellitus without complications: Secondary | ICD-10-CM | POA: Diagnosis not present

## 2020-07-11 DIAGNOSIS — Z794 Long term (current) use of insulin: Secondary | ICD-10-CM | POA: Diagnosis not present

## 2020-07-13 DIAGNOSIS — Z794 Long term (current) use of insulin: Secondary | ICD-10-CM | POA: Diagnosis not present

## 2020-07-13 DIAGNOSIS — E119 Type 2 diabetes mellitus without complications: Secondary | ICD-10-CM | POA: Diagnosis not present

## 2020-07-13 DIAGNOSIS — Z7984 Long term (current) use of oral hypoglycemic drugs: Secondary | ICD-10-CM | POA: Diagnosis not present

## 2020-07-13 DIAGNOSIS — E039 Hypothyroidism, unspecified: Secondary | ICD-10-CM | POA: Diagnosis not present

## 2020-07-28 ENCOUNTER — Other Ambulatory Visit (HOSPITAL_COMMUNITY): Payer: Self-pay | Admitting: *Deleted

## 2020-07-29 ENCOUNTER — Ambulatory Visit (HOSPITAL_COMMUNITY)
Admission: RE | Admit: 2020-07-29 | Discharge: 2020-07-29 | Disposition: A | Discharge status: Home / Self Care | Payer: Medicare PPO | Source: Ambulatory Visit | Attending: Internal Medicine | Admitting: Internal Medicine

## 2020-07-29 ENCOUNTER — Other Ambulatory Visit: Payer: Self-pay

## 2020-07-29 DIAGNOSIS — Z79899 Other long term (current) drug therapy: Secondary | ICD-10-CM | POA: Diagnosis not present

## 2020-07-29 DIAGNOSIS — Z5181 Encounter for therapeutic drug level monitoring: Secondary | ICD-10-CM | POA: Diagnosis not present

## 2020-07-29 DIAGNOSIS — Z940 Kidney transplant status: Secondary | ICD-10-CM | POA: Diagnosis not present

## 2020-07-29 LAB — URINALYSIS, COMPLETE (UACMP) WITH MICROSCOPIC
Bilirubin Urine: NEGATIVE
Glucose, UA: NEGATIVE mg/dL
Hgb urine dipstick: NEGATIVE
Ketones, ur: NEGATIVE mg/dL
Nitrite: NEGATIVE
Protein, ur: NEGATIVE mg/dL
Specific Gravity, Urine: 1.016 (ref 1.005–1.030)
pH: 5 (ref 5.0–8.0)

## 2020-07-29 LAB — CBC WITH DIFFERENTIAL/PLATELET
Abs Immature Granulocytes: 0.27 10*3/uL — ABNORMAL HIGH (ref 0.00–0.07)
Basophils Absolute: 0 10*3/uL (ref 0.0–0.1)
Basophils Relative: 1 %
Eosinophils Absolute: 0.1 10*3/uL (ref 0.0–0.5)
Eosinophils Relative: 2 %
HCT: 36.2 % (ref 36.0–46.0)
Hemoglobin: 11.1 g/dL — ABNORMAL LOW (ref 12.0–15.0)
Immature Granulocytes: 7 %
Lymphocytes Relative: 28 %
Lymphs Abs: 1.1 10*3/uL (ref 0.7–4.0)
MCH: 25.6 pg — ABNORMAL LOW (ref 26.0–34.0)
MCHC: 30.7 g/dL (ref 30.0–36.0)
MCV: 83.4 fL (ref 80.0–100.0)
Monocytes Absolute: 0.6 10*3/uL (ref 0.1–1.0)
Monocytes Relative: 15 %
Neutro Abs: 1.8 10*3/uL (ref 1.7–7.7)
Neutrophils Relative %: 47 %
Platelets: 229 10*3/uL (ref 150–400)
RBC: 4.34 MIL/uL (ref 3.87–5.11)
RDW: 19.1 % — ABNORMAL HIGH (ref 11.5–15.5)
WBC: 3.8 10*3/uL — ABNORMAL LOW (ref 4.0–10.5)
nRBC: 0 % (ref 0.0–0.2)

## 2020-07-29 LAB — BASIC METABOLIC PANEL
Anion gap: 10 (ref 5–15)
BUN: 19 mg/dL (ref 8–23)
CO2: 22 mmol/L (ref 22–32)
Calcium: 9.8 mg/dL (ref 8.9–10.3)
Chloride: 108 mmol/L (ref 98–111)
Creatinine, Ser: 1.04 mg/dL — ABNORMAL HIGH (ref 0.44–1.00)
GFR, Estimated: 59 mL/min — ABNORMAL LOW (ref >60)
Glucose, Bld: 150 mg/dL — ABNORMAL HIGH (ref 70–99)
Potassium: 4.6 mmol/L (ref 3.5–5.1)
Sodium: 140 mmol/L (ref 135–145)

## 2020-07-29 LAB — T4, FREE: Free T4: 1.06 ng/dL (ref 0.61–1.12)

## 2020-07-29 LAB — PROTEIN / CREATININE RATIO, URINE
Creatinine, Urine: 102.56 mg/dL
Protein Creatinine Ratio: 0.1 mg/mg{Cre} (ref 0.00–0.15)
Total Protein, Urine: 10 mg/dL

## 2020-07-29 LAB — HEMOGLOBIN A1C
Hgb A1c MFr Bld: 7.9 % — ABNORMAL HIGH (ref 4.8–5.6)
Mean Plasma Glucose: 180.03 mg/dL

## 2020-07-29 LAB — TSH: TSH: 0.219 u[IU]/mL — ABNORMAL LOW (ref 0.350–4.500)

## 2020-07-29 MED ORDER — SODIUM CHLORIDE 0.9 % IV SOLN
5.0000 mg/kg | INTRAVENOUS | Status: DC
  Administered 2020-07-29: 375 mg via INTRAVENOUS
  Filled 2020-07-29: qty 375

## 2020-07-30 LAB — BK QUANT PCR (PLASMA/SERUM)
BK Quantitaion PCR: 108 IU/mL
Log10 BK Qn PCR: 2.033 log10 IU/mL

## 2020-07-30 LAB — CMV DNA, QUANTITATIVE, PCR
CMV DNA Quant: NEGATIVE IU/mL
Log10 CMV Qn DNA Pl: UNDETERMINED log10 IU/mL

## 2020-08-02 LAB — SIROLIMUS LEVEL: Sirolimus (Rapamycin): 4.5 ng/mL (ref 3.0–20.0)

## 2020-08-10 DIAGNOSIS — E109 Type 1 diabetes mellitus without complications: Secondary | ICD-10-CM | POA: Diagnosis not present

## 2020-08-10 DIAGNOSIS — Z79899 Other long term (current) drug therapy: Secondary | ICD-10-CM | POA: Diagnosis not present

## 2020-08-10 DIAGNOSIS — Z794 Long term (current) use of insulin: Secondary | ICD-10-CM | POA: Diagnosis not present

## 2020-08-10 DIAGNOSIS — Z940 Kidney transplant status: Secondary | ICD-10-CM | POA: Diagnosis not present

## 2020-08-10 DIAGNOSIS — I10 Essential (primary) hypertension: Secondary | ICD-10-CM | POA: Diagnosis not present

## 2020-08-10 NOTE — Progress Notes (Signed)
Future Appointments  Date Time Provider [place]  08/11/2020 10:30 AM Unk Pinto, MD [facility] None  08/26/2020 11:00 AM [facility] None  10/06/2020 11:30 AM Liane Comber, NP [facility] None  11/12/2020  9:30 AM Gardiner Barefoot, DPM [facility]  01/20/2021 11:00 AM Unk Pinto, MD [facility] None     History of Present Illness:       Patient is a very nice  70 y.o. MBFwith HTCVD, Insulin req T2_DM, legal blindness, Hypothyroidism, ESRD s/p Kidney Transplant(12/2017)  Now CKD3a (GFR 59)  Who presents for evaluation of two patches of scaling skin on Rt lateral  abdomen and Lt  Ant thigh. Also she needs a local refill Lantus pen for delayed mail order.  Relates CBG's usu range betw 100-150 w/o hypoglycemia.  Also she relates sx's of GERD & water brash due to certain dietary indiscretions as chocolate, bananas, peppermint or tomato products.  Medications  Current Outpatient Medications (Endocrine & Metabolic):  .  insulin glargine (LANTUS) 100 UNIT/ML injection, Inject 18 Units into the skin daily.  .  insulin lispro (HUMALOG KWIKPEN) 100 UNIT/ML KwikPen,  .  levothyroxine (SYNTHROID) 125 MCG tablet, Take 125 mcg by mouth. Takes 1 tablet daily except on Sundays. Marland Kitchen  linagliptin (TRADJENTA) 5 MG TABS tablet, Take by mouth.  Current Outpatient Medications (Cardiovascular):  .  amLODipine (NORVASC) 2.5 MG tablet, Take one tablet twice a day .  carvedilol (COREG) 6.25 MG tablet, Take 6.25 mg by mouth 2 (two) times daily with a meal. .  ezetimibe (ZETIA) 10 MG tablet, Take 1 tablet (10 mg total) by mouth daily. Marland Kitchen  gemfibrozil (LOPID) 600 MG tablet, Take 1 tablet 2 x /day for Triglycerides (Blood Fats)   Current Outpatient Medications (Analgesics):  .  aspirin EC 81 MG tablet, Take 81 mg by mouth daily.   Current Outpatient Medications (Other):  .  belatacept (NULOJIX) 250 MG SOLR injection, Inject into the vein. .  Insulin Pen Needle (FIFTY50 PEN) 31G X  5 MM MISC, Test sugars Three to four times daily .  sirolimus (RAPAMUNE) 1 MG tablet, Take    1 tablet     Daily     for Kidney Transplant (Patient taking differently: Take 2 tablet     Daily     for Kidney Transplant) .  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   Observations/Objective:   BP 132/70   Pulse 80   Temp (!) 97.5 F (36.4 C) (Temporal)   Resp 16   Wt 170 lb (77.1 kg)   LMP  (LMP Unknown)   SpO2 97%   BMI 27.44 kg/m   HEENT - TM's - Nl. EOMs full , Bilat IOL. Total blind Lt Eye and intact to hand motion/finger counting  & hand motion on the Rt. N/O/P - clear Neck - supple.  Chest - Clear equal BS. Cor - Nl HS. RRR w/o sig MGR. PP 1(+). No edema. Abd - soft . Non-tender . Benign. MS- FROM w/o deformities.  Gait Nl. Skin  Neuro -  Nl w/o focal abnormalities.   Assessment and Plan:  1. Essential hypertension  2. Gastroesophageal reflux disease with esophagitis without hemorrhage  - Discussed /Reviewed anti-dyspeptic diet   3. Type 2 diabetes mellitus with stage 2 chronic kidney  disease, with long-term current use of insulin (HCC)   Follow Up Instructions:        I discussed the assessment and treatment plan with the patient. The patient was provided an opportunity to ask questions and all were answered. The patient agreed with the plan and demonstrated an understanding of the instructions.       The patient was  advised to call back or seek an in-person evaluation if the symptoms worsen or if the condition fails to improve as anticipated.   Kirtland Bouchard, MD

## 2020-08-11 ENCOUNTER — Ambulatory Visit (INDEPENDENT_AMBULATORY_CARE_PROVIDER_SITE_OTHER): Payer: TRICARE For Life (TFL) | Admitting: Internal Medicine

## 2020-08-11 ENCOUNTER — Other Ambulatory Visit: Payer: Self-pay

## 2020-08-11 ENCOUNTER — Encounter: Payer: Self-pay | Admitting: Internal Medicine

## 2020-08-11 VITALS — BP 132/70 | HR 80 | Temp 97.5°F | Resp 16 | Ht 66.0 in | Wt 170.0 lb

## 2020-08-11 DIAGNOSIS — Z794 Long term (current) use of insulin: Secondary | ICD-10-CM | POA: Diagnosis not present

## 2020-08-11 DIAGNOSIS — K2100 Gastro-esophageal reflux disease with esophagitis, without bleeding: Secondary | ICD-10-CM | POA: Diagnosis not present

## 2020-08-11 DIAGNOSIS — N182 Chronic kidney disease, stage 2 (mild): Secondary | ICD-10-CM | POA: Diagnosis not present

## 2020-08-11 DIAGNOSIS — I10 Essential (primary) hypertension: Secondary | ICD-10-CM | POA: Diagnosis not present

## 2020-08-11 DIAGNOSIS — E1122 Type 2 diabetes mellitus with diabetic chronic kidney disease: Secondary | ICD-10-CM | POA: Diagnosis not present

## 2020-08-11 MED ORDER — INSULIN GLARGINE 100 UNIT/ML SOLOSTAR PEN: PEN_INJECTOR | SUBCUTANEOUS | 0 refills | Status: DC

## 2020-08-11 NOTE — Patient Instructions (Signed)

## 2020-08-25 ENCOUNTER — Other Ambulatory Visit (HOSPITAL_COMMUNITY): Payer: Self-pay

## 2020-08-26 ENCOUNTER — Ambulatory Visit (HOSPITAL_COMMUNITY)
Admission: RE | Admit: 2020-08-26 | Discharge: 2020-08-26 | Disposition: A | Discharge status: Home / Self Care | Payer: Medicare PPO | Source: Ambulatory Visit | Attending: Internal Medicine | Admitting: Internal Medicine

## 2020-08-26 ENCOUNTER — Other Ambulatory Visit: Payer: Self-pay

## 2020-08-26 DIAGNOSIS — Z5181 Encounter for therapeutic drug level monitoring: Secondary | ICD-10-CM | POA: Diagnosis not present

## 2020-08-26 DIAGNOSIS — E119 Type 2 diabetes mellitus without complications: Secondary | ICD-10-CM | POA: Diagnosis not present

## 2020-08-26 DIAGNOSIS — B349 Viral infection, unspecified: Secondary | ICD-10-CM | POA: Diagnosis present

## 2020-08-26 DIAGNOSIS — B259 Cytomegaloviral disease, unspecified: Secondary | ICD-10-CM | POA: Insufficient documentation

## 2020-08-26 DIAGNOSIS — Z940 Kidney transplant status: Secondary | ICD-10-CM | POA: Diagnosis not present

## 2020-08-26 LAB — PROTEIN / CREATININE RATIO, URINE
Creatinine, Urine: 109.27 mg/dL
Protein Creatinine Ratio: 0.1 mg/mg{Cre} (ref 0.00–0.15)
Total Protein, Urine: 11 mg/dL

## 2020-08-26 LAB — CBC WITH DIFFERENTIAL/PLATELET
Abs Immature Granulocytes: 0 10*3/uL (ref 0.00–0.07)
Basophils Absolute: 0 10*3/uL (ref 0.0–0.1)
Basophils Relative: 0 %
Eosinophils Absolute: 0.1 10*3/uL (ref 0.0–0.5)
Eosinophils Relative: 2 %
HCT: 37.3 % (ref 36.0–46.0)
Hemoglobin: 11.3 g/dL — ABNORMAL LOW (ref 12.0–15.0)
Lymphocytes Relative: 37 %
Lymphs Abs: 1.2 10*3/uL (ref 0.7–4.0)
MCH: 25.2 pg — ABNORMAL LOW (ref 26.0–34.0)
MCHC: 30.3 g/dL (ref 30.0–36.0)
MCV: 83.1 fL (ref 80.0–100.0)
Monocytes Absolute: 0.3 10*3/uL (ref 0.1–1.0)
Monocytes Relative: 8 %
Myelocytes: 1 %
Neutro Abs: 1.7 10*3/uL (ref 1.7–7.7)
Neutrophils Relative %: 52 %
Platelets: 213 10*3/uL (ref 150–400)
RBC: 4.49 MIL/uL (ref 3.87–5.11)
RDW: 19.9 % — ABNORMAL HIGH (ref 11.5–15.5)
WBC: 3.3 10*3/uL — ABNORMAL LOW (ref 4.0–10.5)
nRBC: 0 % (ref 0.0–0.2)
nRBC: 0 /100 WBC

## 2020-08-26 LAB — URINALYSIS, ROUTINE W REFLEX MICROSCOPIC
Bilirubin Urine: NEGATIVE
Glucose, UA: NEGATIVE mg/dL
Hgb urine dipstick: NEGATIVE
Ketones, ur: NEGATIVE mg/dL
Nitrite: NEGATIVE
Protein, ur: NEGATIVE mg/dL
Specific Gravity, Urine: 1.019 (ref 1.005–1.030)
pH: 5 (ref 5.0–8.0)

## 2020-08-26 LAB — BASIC METABOLIC PANEL
Anion gap: 7 (ref 5–15)
BUN: 20 mg/dL (ref 8–23)
CO2: 24 mmol/L (ref 22–32)
Calcium: 9.8 mg/dL (ref 8.9–10.3)
Chloride: 110 mmol/L (ref 98–111)
Creatinine, Ser: 1.01 mg/dL — ABNORMAL HIGH (ref 0.44–1.00)
GFR, Estimated: >60 mL/min (ref >60)
Glucose, Bld: 186 mg/dL — ABNORMAL HIGH (ref 70–99)
Potassium: 4.2 mmol/L (ref 3.5–5.1)
Sodium: 141 mmol/L (ref 135–145)

## 2020-08-26 MED ORDER — SODIUM CHLORIDE 0.9 % IV SOLN
5.0000 mg/kg | INTRAVENOUS | Status: DC
  Administered 2020-08-26: 375 mg via INTRAVENOUS
  Filled 2020-08-26: qty 375

## 2020-08-30 LAB — SIROLIMUS LEVEL: Sirolimus (Rapamycin): 10.9 ng/mL (ref 3.0–20.0)

## 2020-09-09 DIAGNOSIS — E109 Type 1 diabetes mellitus without complications: Secondary | ICD-10-CM | POA: Diagnosis not present

## 2020-09-09 DIAGNOSIS — Z794 Long term (current) use of insulin: Secondary | ICD-10-CM | POA: Diagnosis not present

## 2020-09-23 ENCOUNTER — Ambulatory Visit (HOSPITAL_COMMUNITY)
Admission: RE | Admit: 2020-09-23 | Discharge: 2020-09-23 | Disposition: A | Discharge status: Home / Self Care | Payer: Medicare PPO | Source: Ambulatory Visit | Attending: Internal Medicine | Admitting: Internal Medicine

## 2020-09-23 ENCOUNTER — Other Ambulatory Visit: Payer: Self-pay

## 2020-09-23 DIAGNOSIS — E119 Type 2 diabetes mellitus without complications: Secondary | ICD-10-CM | POA: Insufficient documentation

## 2020-09-23 DIAGNOSIS — Z5181 Encounter for therapeutic drug level monitoring: Secondary | ICD-10-CM | POA: Diagnosis not present

## 2020-09-23 DIAGNOSIS — B259 Cytomegaloviral disease, unspecified: Secondary | ICD-10-CM | POA: Insufficient documentation

## 2020-09-23 DIAGNOSIS — Z940 Kidney transplant status: Secondary | ICD-10-CM | POA: Diagnosis not present

## 2020-09-23 DIAGNOSIS — Z794 Long term (current) use of insulin: Secondary | ICD-10-CM | POA: Diagnosis not present

## 2020-09-23 LAB — URINALYSIS, ROUTINE W REFLEX MICROSCOPIC
Bilirubin Urine: NEGATIVE
Glucose, UA: NEGATIVE mg/dL
Hgb urine dipstick: NEGATIVE
Ketones, ur: NEGATIVE mg/dL
Nitrite: NEGATIVE
Protein, ur: NEGATIVE mg/dL
Specific Gravity, Urine: 1.021 (ref 1.005–1.030)
pH: 5 (ref 5.0–8.0)

## 2020-09-23 LAB — BASIC METABOLIC PANEL
Anion gap: 7 (ref 5–15)
BUN: 20 mg/dL (ref 8–23)
CO2: 23 mmol/L (ref 22–32)
Calcium: 9.7 mg/dL (ref 8.9–10.3)
Chloride: 110 mmol/L (ref 98–111)
Creatinine, Ser: 0.96 mg/dL (ref 0.44–1.00)
GFR, Estimated: >60 mL/min (ref >60)
Glucose, Bld: 150 mg/dL — ABNORMAL HIGH (ref 70–99)
Potassium: 5.1 mmol/L (ref 3.5–5.1)
Sodium: 140 mmol/L (ref 135–145)

## 2020-09-23 LAB — CBC WITH DIFFERENTIAL/PLATELET
Abs Immature Granulocytes: 0 10*3/uL (ref 0.00–0.07)
Basophils Absolute: 0 10*3/uL (ref 0.0–0.1)
Basophils Relative: 1 %
Eosinophils Absolute: 0.1 10*3/uL (ref 0.0–0.5)
Eosinophils Relative: 3 %
HCT: 38.6 % (ref 36.0–46.0)
Hemoglobin: 11.6 g/dL — ABNORMAL LOW (ref 12.0–15.0)
Lymphocytes Relative: 37 %
Lymphs Abs: 1.3 10*3/uL (ref 0.7–4.0)
MCH: 25.2 pg — ABNORMAL LOW (ref 26.0–34.0)
MCHC: 30.1 g/dL (ref 30.0–36.0)
MCV: 83.7 fL (ref 80.0–100.0)
Monocytes Absolute: 0.4 10*3/uL (ref 0.1–1.0)
Monocytes Relative: 10 %
Neutro Abs: 1.7 10*3/uL (ref 1.7–7.7)
Neutrophils Relative %: 49 %
Platelets: 197 10*3/uL (ref 150–400)
RBC: 4.61 MIL/uL (ref 3.87–5.11)
RDW: 19.9 % — ABNORMAL HIGH (ref 11.5–15.5)
WBC: 3.5 10*3/uL — ABNORMAL LOW (ref 4.0–10.5)
nRBC: 0 % (ref 0.0–0.2)
nRBC: 0 /100 WBC

## 2020-09-23 LAB — PROTEIN / CREATININE RATIO, URINE
Creatinine, Urine: 148.56 mg/dL
Protein Creatinine Ratio: 0.08 mg/mg{Cre} (ref 0.00–0.15)
Total Protein, Urine: 12 mg/dL

## 2020-09-23 MED ORDER — SODIUM CHLORIDE 0.9 % IV SOLN
5.0000 mg/kg | INTRAVENOUS | Status: DC
  Administered 2020-09-23: 375 mg via INTRAVENOUS
  Filled 2020-09-23: qty 375

## 2020-09-28 LAB — SIROLIMUS LEVEL: Sirolimus (Rapamycin): 3.6 ng/mL (ref 3.0–20.0)

## 2020-10-04 ENCOUNTER — Encounter: Payer: Self-pay | Admitting: Adult Health

## 2020-10-04 DIAGNOSIS — H547 Unspecified visual loss: Secondary | ICD-10-CM | POA: Insufficient documentation

## 2020-10-04 NOTE — Progress Notes (Signed)
MEDICARE ANNUAL WELLNESS VISIT AND FOLLOW UP Assessment:    Diagnoses and all orders for this visit:  Medicare annual wellness visit, subsequent Due annually  Check with [facility] about Td  Aortic atherosclerosis ([place]) Per imaging - CT 2017 Control blood pressure, lipids and glucose Disscused lifestyle modifications, diet & exercise  Essential hypertension Continue medication Monitor blood pressure at home; call if consistently over 130/80 Continue DASH diet.   Reminder to go to the ER if any CP, SOB, nausea, dizziness, severe HA, changes vision/speech, left arm numbness and tingling and jaw pain. -     CBC with Differential/Platelet -     COMPLETE METABOLIC PANEL WITH GFR  Hyperlipidemia associated with type 2 diabetes mellitus (HCC) Continue medications; zetia, lopid Statin intolerance/myopathy LDL goal <70 Discussed dietary and exercise modifications Low fat diet -     Lipid panel  S/p Kidney replaced by transplant/ Immunosuppression [facility] follows with routine labs for monitoring and management of transplant meds  Type 2 diabetes mellitus with stage 2 chronic kidney disease, with long-term current use of insulin (HCC) Diabetes mellitus with cataract ([place]) Follows with [facility] Lantus 23units daily, sliding scale novolin Tradjenta '5mg'$  Freestyle Libre to better evaluate blood glucose levels related to hyperglycemia. Doing well   Diabetic retinopathy ([place]), Cataract ([place]) Control glucose, ophthalmology following   Diabetic neuropathy ([place]) Mild, bil toes; declines meds  Control glucose  Hypothyroidism due to Hashimoto's thyroiditis Taking levothyroxine '125mg'$  5.5 tabs/week Reminder to take on an empty stomach 30-21mns before first meal of the day. No antacid medications for 4 hours. Follows with Endocrinology  Primary osteoarthritis involving multiple joints Ortho follows; getting injections PRN Advanced lateral compartment arthritis R knee; discussed  higher complexity surgery with advanced unilateral wearing; she does plan to pursue replacement at some point; she will reach out to Dr. DNorm Saltto discuss recommendations  Vitamin D deficiency Continue supplementation to maintain goal of 60-100  Poor vision/ Legally blind Discussed safety Routine opthalmology evaluations - with [facility] specialist Husband assists patient  Medication management Continued  Overweight - BMI 27 Discussed dietary and exercise modifications   Follow Up Instructions:    I discussed the assessment and treatment plan with the patient. The patient was provided an opportunity to ask questions and all were answered. The patient agreed with the plan and demonstrated an understanding of the instructions.   The patient was advised to call back or seek an in-person evaluation if the symptoms worsen or if the condition fails to improve as anticipated.  I provided 30 minutes of face-to-face time during this encounter including counseling, chart review, and critical decision making was preformed.   Future Appointments  Date Time Provider DCawood 10/21/2020 11:00 AM [id number] None  11/12/2020  9:30 AM MGardiner Barefoot DPM [facility]  01/20/2021 11:00 AM MUnk Pinto MD [facility] None  10/06/2021 11:30 AM CLiane Comber NP [facility] None      Plan:   During the course of the visit the patient was educated and counseled about appropriate screening and preventive services including:    Pneumococcal vaccine   Influenza vaccine  Prevnar 13  Td vaccine  Screening electrocardiogram, deferred, telephone visit [facility]  Colorectal cancer screening  Diabetes screening  Glaucoma screening  Nutrition counseling    Subjective:  Denise Bush a 70y.o. female who presents for Medicare Annual Wellness Visit and 3 month follow up for HTN, HLD,DMII, legally blind, hypothyroidism, ESRD and now S/P recipient of kidney  transplant  and vitamin D Def.   Husband drives and manages medications due to legally blind; denies falls this year, walks with cane for vision and R knee arthritis, has house keeper that comes weekly. She follows with Dr Lynnette Caffey at [facility] s/p kidney transplant in 12/2017, on sirolimus and levels monitored routinely by them.   She is seeing Orthopedics Dr. Norm Salt for knee and hip injections, she gets steroid and gel injections that do cause glucose to be fairly elevated and labile. R knee with advanced compartmental arthritis. Have discussed replacement.   BMI is Body mass index is 27.6 kg/m., she has been working on diet, reports generally active but no intentional exercise but reports generally active around home. R knee advanced arthritis. Discussed low impact exercises, water aerobics.  Wt Readings from Last 3 Encounters:  10/06/20 171 lb (77.6 kg)  09/23/20 165 lb (74.8 kg)  08/26/20 168 lb (76.2 kg)   Her blood pressure has been controlled at home.  She is unable to read the numbers on the machine.  Her husband usually does this for her. Today their BP is BP: 124/62 She does not workout. She denies chest pain, shortness of breath, dizziness.   She has aortic atherosclerosis per CT in 2017.   She is on cholesterol medication and denies myalgias (zeita, lopid). She had fatigue/myalgias with several statins remotely, family hx of statin intolerance. Her cholesterol is not at goal.  The cholesterol last visit was:   Lab Results  Component Value Date   CHOL 172 04/21/2020   HDL 62 04/21/2020   LDLCALC 87 04/21/2020   TRIG 136 04/21/2020   CHOLHDL 2.8 04/21/2020   She has been working on diet and exercise for DMII She follows with [facility] endocrinology Dr. Renie Ora and her last OV was 07/2020 with A1C of 7.9% on 07/29/2020. She continues on trajenta 5 mg daily. Lantus was increased from 20 to 23 units Does have humalog, takes 3 units if 200+, 4 units 250+ PRN only.  She has freestyle libre,  checking QID. She has freestyle libre denies foot ulcerations, hypoglycemia , increased appetite, nausea, polydipsia, polyuria, vomiting and weight loss. She does have mild burning in feet, more at night, mild, declines meds at this time. Last A1C in the office was:  Lab Results  Component Value Date   HGBA1C 7.9 (H) 07/29/2020   She has hx of diabetic/htn ESRD, did 5 years dialysis, s/p R kidney transplant. Dr. Tobin Chad at [facility] follows closely. Last GFR Lab Results  Component Value Date   GFRAA 61 04/21/2020   Patient is not currently on Vitamin D supplement for defciency (25, 2016).  Lab Results  Component Value Date   VD25OH 26 (L) 01/16/2020     Patient has hypothyroidism and on replacement since 1971. Dr. Renie Ora manages. Currently taking levothyroxine 171mg 5 days a week, 1/2 pill on Thursday, skips on Sunday.  Her medication was changed last visit. Lab Results  Component Value Date   TSH 0.219 (L) 07/29/2020      Medication Review:  Current Outpatient Medications (Endocrine & Metabolic):  .  insulin glargine (LANTUS) 100 UNIT/ML Solostar Pen, Inject  20 units into skin  Daily for Diabetes .  insulin lispro (HUMALOG) 100 UNIT/ML KwikPen,  .  levothyroxine (SYNTHROID) 125 MCG tablet, Take 125 mcg by mouth. Takes 1 tablet daily except on Sundays. .Marland Kitchen linagliptin (TRADJENTA) 5 MG TABS tablet, Take by mouth.  Current Outpatient Medications (Cardiovascular):  .  amLODipine (NORVASC) 2.5 MG tablet,  Take one tablet twice a day .  carvedilol (COREG) 6.25 MG tablet, Take 6.25 mg by mouth 2 (two) times daily with a meal. .  ezetimibe (ZETIA) 10 MG tablet, Take 1 tablet (10 mg total) by mouth daily. Marland Kitchen  gemfibrozil (LOPID) 600 MG tablet, Take 1 tablet 2 x /day for Triglycerides (Blood Fats)   Current Outpatient Medications (Analgesics):  .  aspirin EC 81 MG tablet, Take 81 mg by mouth daily.   Current Outpatient Medications (Other):  .  belatacept (NULOJIX) 250 MG SOLR  injection, Inject into the vein. .  Continuous Blood Gluc Receiver (FREESTYLE LIBRE 14 DAY READER) DEVI, Use 1 Application as directed .  Continuous Blood Gluc Sensor (FREESTYLE LIBRE 14 DAY SENSOR) MISC, Place 1 Device onto the skin every 14 (fourteen) days. DX-E11.22,n18.6,Z99.2 .  glucose blood test strip, 1 each. .  Insulin Pen Needle (FIFTY50 PEN NEEDLES) 31G X 5 MM MISC, Test sugars Three to four times daily .  Lancets Misc. (UNISTIK 2 NORMAL) MISC, Use 1 each 4 (four) times daily Product selection permitted according to insurance preference. E11.9 Type 2 diabetes mellitus .  sirolimus (RAPAMUNE) 1 MG tablet, Take    1 tablet     Daily     for Kidney Transplant (Patient taking differently: Take 2 tablet     Daily     for Kidney Transplant)  Allergies: Allergies  Allergen Reactions  . Hydralazine Shortness Of Breath    Chest pain fatigue Chest pain  . Azor [Amlodipine-Olmesartan] Other (See Comments)    Sharp chest pain  Sharp chest pain  Chest pain  . Camellia Other (See Comments)  . Denaverine Other (See Comments)  . Diphenhydramine Other (See Comments)  . Diphenhydramine Hcl Other (See Comments)  . Hydrochlorothiazide Other (See Comments)  . Hydrochlorothiazide W-Triamterene   . Labetalol     Per patient, caused foot pain  . Laureth Other (See Comments)  . Lisinopril Other (See Comments)    Severe radiating foot pain.  . Metformin Itching    Loss of bowel control, general sick feeling  . Metoclopramide Other (See Comments)  . Other Other (See Comments)    "contractions in throat" Diazide pt states cause throat to swell.  . Prednisone Other (See Comments)    Elevated Glucose   . Minoxidil Rash    Choking, neck contractions fatigue  . Spironolactone Other (See Comments) and Rash    Severe radiating pain in feet Severe radiating pain in feet.  . Statins Rash    Fatigue Cause fatigue    Current Problems (verified) has Hypothyroidism; Essential hypertension;  Allergic rhinitis; Hyperlipidemia associated with type 2 diabetes mellitus ([place]); Vitamin D deficiency; Medication management; Diabetic proliferative retinopathy ([place]); Arthritis, degenerative; Overweight (BMI 25.0-29.9); Type 2 diabetes mellitus treated with insulin ([place]); Cataract associated with type 2 diabetes mellitus ([place]); Kidney replaced by transplant; Pain due to onychomycosis of toenails of both feet; Diabetic neuropathy ([place]); Aortic atherosclerosis ([place]) by CT scan in 2017; Immunosuppression ([place]); Prophylactic antibiotic; 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; Poor vision; and Statin myopathy on their problem list.  Screening Tests Immunization History  Administered Date(s) Administered  . Influenza Split 01/19/2011  . Influenza Whole 04/23/2007  . Influenza, High Dose Seasonal PF 01/08/2019  . Influenza, Seasonal, Injecte, Preservative Fre 01/25/2012, 01/30/2013, 03/05/2014  . Influenza,inj,quad, With Preservative 03/10/2015  . Influenza-Unspecified 01/05/2014, 02/28/2016, 01/08/2019  . PFIZER(Purple Top)SARS-COV-2 Vaccination 06/07/2019, 06/28/2019, 12/30/2019, 08/31/2020  . Pneumococcal Conjugate-13 03/06/2017  . Pneumococcal Polysaccharide-23 02/22/2016  .  Pneumococcal-Unspecified 01/05/2010  . Zoster Recombinat (Shingrix) 03/26/2019, 07/14/2019  . Zoster, Live 07/14/2019    Preventative care: Last colonoscopy: 07/2015, normal, Dr. Loletha Carrow, 10 year recall  Mammogram: 06/2020 PAP: 2017, normal  DEXA: 01/2019, normal   Prior vaccinations: TD or Tdap: DUE - ? Had with transplant, will check, with insurance will boost PRN Influenza: 2020  Pneumococcal 23: 2011, 2017, will boost 5 years from last due due 03/2022 Prevnar13: 03/2017 Shingles/Zostavax: Shingrix completed  SARS-COV2-Pfizer - complete 2/2, + 2 boosters  Names of Other Physician/Practitioners you currently use: 1. [facility] here for primary care 2. Eye  Exam:  Dr Milly Jakob, last 03/17/2020 has upcoming 01/2021, now goes annually 3.Dental Exam: Dr. Milford Cage, 10/05/2020  Patient Care Team: Unk Pinto, MD as PCP - General (Internal Medicine) Rexene Agent, MD as Attending Physician (Nephrology) Edrick Oh, MD as Consulting Physician (Nephrology) Gardiner Barefoot, DPM as Consulting Physician (Podiatry) Raylene Miyamoto, Jake Samples, MD as Referring Physician (Ophthalmology) Janina Mayo, NP as Nurse Practitioner (Nurse Practitioner)  Surgical: She  has a past surgical history that includes Eye surgery; Abdominal hysterectomy; Knee surgery; Colonoscopy; and Breast biopsy (Right, 2015). Family Her family history includes Breast cancer in her maternal aunt, maternal aunt, maternal grandmother, and sister; CAD in an other family member; Colon polyps in her maternal aunt and maternal uncle. Social history  She reports that she has never smoked. She has never used smokeless tobacco. She reports that she does not drink alcohol. No history on file for drug use.  MEDICARE WELLNESS OBJECTIVES: Physical activity: Current Exercise Habits: The patient does not participate in regular exercise at present, Exercise limited by: orthopedic condition(s) Cardiac risk factors: Cardiac Risk Factors include: advanced age (>81mn, >>19women);dyslipidemia;hypertension;sedentary lifestyle;diabetes mellitus Depression/mood screen:   [facility]/06/[facility]  Decreased Interest 0  Down, Depressed, Hopeless 0  PHQ - 2 Score 0    ADLs:  In your present state of health, do you have any difficulty performing the following activities: 10/06/2020 01/15/2020  Hearing? N N  Vision? YTempie Donning Comment legally blind -  Difficulty concentrating or making decisions? N N  Walking or climbing stairs? N Y  Comment single story, no steps, has adjusted -  Dressing or bathing? N N  Doing errands, shopping? Y Y  Comment husband drives -  PConservation officer, natureand eating ? N -  Using  the Toilet? N -  In the past six months, have you accidently leaked urine? N -  Do you have problems with loss of bowel control? N -  Managing your Medications? Y -  Comment husband -  Managing your Finances? N -  Comment husnbandmanages -  Housekeeping or managing your Housekeeping? N -  Comment has a hChartered certified accountant -  Some recent data might be hidden     Cognitive Testing  Alert? Yes  Normal Appearance?Yes  Oriented to person? Yes  Place? Yes   Time? Yes  Recall of three objects?  Yes  Can perform simple calculations? Yes  Displays appropriate judgment?Yes  Can read the correct time from a watch face?Yes  EOL planning: Does Patient Have a Medical Advance Directive?: Yes Type of Advance Directive: [place] Does patient want to make changes to medical advance directive?: No - Patient declined Copy of [place]anch[place]in Chart?: No - copy requested   Objective:   Today's Vitals   10/06/20 1128  BP: 124/62  Pulse: 65  Temp: (Marland Kitchen  96.6 F (35.9 C)  SpO2: 98%  Weight: 171 lb (77.6 kg)  Height: '5\' 6"'$  (1.676 m)   Body mass index is 27.6 kg/m.  General Appearance: Well nourished, well dressed, AA female elder in no apparent distress. Eyes: EOMs, conjunctiva no swelling or erythema. Total blind L Eye and intact to hand motion/finger counting/ hand motion on R. Sinuses: No Frontal/maxillary tenderness ENT/Mouth: Ext aud canals clear, TMs without erythema, bulging. No erythema, swelling, or exudate on post pharynx.  Tonsils not swollen or erythematous. Hearing normal.  Neck: Supple, thyroid normal.  Respiratory: Respiratory effort normal, BS equal bilaterally without rales, rhonchi, wheezing or stridor.  Cardio: RRR with no MRGs. Brisk peripheral pulses without edema.  Abdomen: Soft, + BS.  Non tender, no guarding, rebound, hernias, masses. Lymphatics: Non tender without lymphadenopathy.  Musculoskeletal: Full ROM, excepting R knee  with obvious valgus deformity, 5/5 strength, slow sitting to standing, slow antalgic gait with cane Skin: Warm, dry without rashes, lesions, ecchymosis.  Neuro: Cranial nerves intact. No cerebellar symptoms.  Psych: Awake and oriented X 3, normal affect, Insight and Judgment appropriate.    Medicare Attestation I have personally reviewed: The patient's medical and social history Their use of alcohol, tobacco or illicit drugs Their current medications and supplements The patient's functional ability including ADLs,fall risks, home safety risks, cognitive, and hearing and visual impairment Diet and physical activities Evidence for depression or mood disorders  The patient's weight, height, BMI, and visual acuity have been recorded in the chart.  I have made referrals, counseling, and provided education to the patient based on review of the above and I have provided the patient with a written personalized care plan for preventive services.    Izora Ribas, NP 12:26 PM [facility]

## 2020-10-06 ENCOUNTER — Ambulatory Visit (INDEPENDENT_AMBULATORY_CARE_PROVIDER_SITE_OTHER): Payer: TRICARE For Life (TFL) | Admitting: Adult Health

## 2020-10-06 ENCOUNTER — Ambulatory Visit: Payer: Medicare Other | Admitting: Adult Health Nurse Practitioner

## 2020-10-06 ENCOUNTER — Encounter: Payer: Self-pay | Admitting: Adult Health

## 2020-10-06 ENCOUNTER — Other Ambulatory Visit: Payer: Self-pay

## 2020-10-06 VITALS — BP 124/62 | HR 65 | Temp 96.6°F | Ht 66.0 in | Wt 171.0 lb

## 2020-10-06 DIAGNOSIS — I700 Atherosclerosis of aorta: Secondary | ICD-10-CM | POA: Diagnosis not present

## 2020-10-06 DIAGNOSIS — R7989 Other specified abnormal findings of blood chemistry: Secondary | ICD-10-CM

## 2020-10-06 DIAGNOSIS — D849 Immunodeficiency, unspecified: Secondary | ICD-10-CM | POA: Diagnosis not present

## 2020-10-06 DIAGNOSIS — Z79899 Other long term (current) drug therapy: Secondary | ICD-10-CM | POA: Diagnosis not present

## 2020-10-06 DIAGNOSIS — E1142 Type 2 diabetes mellitus with diabetic polyneuropathy: Secondary | ICD-10-CM

## 2020-10-06 DIAGNOSIS — Z794 Long term (current) use of insulin: Secondary | ICD-10-CM

## 2020-10-06 DIAGNOSIS — E559 Vitamin D deficiency, unspecified: Secondary | ICD-10-CM

## 2020-10-06 DIAGNOSIS — T466X5A Adverse effect of antihyperlipidemic and antiarteriosclerotic drugs, initial encounter: Secondary | ICD-10-CM

## 2020-10-06 DIAGNOSIS — Z792 Long term (current) use of antibiotics: Secondary | ICD-10-CM

## 2020-10-06 DIAGNOSIS — E083529 Diabetes mellitus due to underlying condition with proliferative diabetic retinopathy with traction retinal detachment involving the macula, unspecified eye: Secondary | ICD-10-CM

## 2020-10-06 DIAGNOSIS — M8949 Other hypertrophic osteoarthropathy, multiple sites: Secondary | ICD-10-CM

## 2020-10-06 DIAGNOSIS — E039 Hypothyroidism, unspecified: Secondary | ICD-10-CM

## 2020-10-06 DIAGNOSIS — E119 Type 2 diabetes mellitus without complications: Secondary | ICD-10-CM

## 2020-10-06 DIAGNOSIS — E1136 Type 2 diabetes mellitus with diabetic cataract: Secondary | ICD-10-CM | POA: Diagnosis not present

## 2020-10-06 DIAGNOSIS — M159 Polyosteoarthritis, unspecified: Secondary | ICD-10-CM

## 2020-10-06 DIAGNOSIS — I10 Essential (primary) hypertension: Secondary | ICD-10-CM

## 2020-10-06 DIAGNOSIS — R6889 Other general symptoms and signs: Secondary | ICD-10-CM

## 2020-10-06 DIAGNOSIS — Z940 Kidney transplant status: Secondary | ICD-10-CM

## 2020-10-06 DIAGNOSIS — E1169 Type 2 diabetes mellitus with other specified complication: Secondary | ICD-10-CM | POA: Diagnosis not present

## 2020-10-06 DIAGNOSIS — Z0001 Encounter for general adult medical examination with abnormal findings: Secondary | ICD-10-CM

## 2020-10-06 DIAGNOSIS — H547 Unspecified visual loss: Secondary | ICD-10-CM

## 2020-10-06 DIAGNOSIS — G720 Drug-induced myopathy: Secondary | ICD-10-CM | POA: Insufficient documentation

## 2020-10-06 DIAGNOSIS — E663 Overweight: Secondary | ICD-10-CM

## 2020-10-06 DIAGNOSIS — Z0000 Encounter for general adult medical examination without abnormal findings: Secondary | ICD-10-CM

## 2020-10-06 DIAGNOSIS — E785 Hyperlipidemia, unspecified: Secondary | ICD-10-CM | POA: Diagnosis not present

## 2020-10-06 NOTE — Patient Instructions (Addendum)
  Ms. Solimine , Thank you for taking time to come for your Medicare Wellness Visit. I appreciate your ongoing commitment to your health goals. Please review the following plan we discussed and let me know if I can assist you in the future.   These are the goals we discussed: Goals   None     This is a list of the screening recommended for you and due dates:  Health Maintenance  Topic Date Due  . Pneumococcal Vaccination (1 of 4 - PCV13) Never done  . Tetanus Vaccine  Never done  . Eye exam for diabetics  03/08/2017  . Flu Shot  11/29/2020  . Complete foot exam   01/15/2021  . Hemoglobin A1C  01/28/2021  . Pneumonia vaccines (2 of 2 - PPSV23) 02/21/2021  . Mammogram  06/10/2022  . Colon Cancer Screening  08/22/2025  . DEXA scan (bone density measurement)  Completed  . COVID-19 Vaccine  Completed  . Hepatitis C Screening: USPSTF Recommendation to screen - Ages 59-79 yo.  Completed  . Zoster (Shingles) Vaccine  Completed  . HPV Vaccine  Aged Out  . Urine Protein Check  Discontinued     Try chobani extra creamy oat milk instead of regular   Check labels to reduce saturated fat,   Increase soluble fiber (oats, beans, chia seeds, ground flax seeds)  Check with Dr. Mancel Bale if ok to start vitamin D - 1000-2000 IU daily  Please verify last tetanus vaccine with transplant team -

## 2020-10-07 LAB — COMPLETE METABOLIC PANEL WITH GFR
AG Ratio: 1.6 (calc) (ref 1.0–2.5)
ALT: 13 U/L (ref 6–29)
AST: 19 U/L (ref 10–35)
Albumin: 4.5 g/dL (ref 3.6–5.1)
Alkaline phosphatase (APISO): 86 U/L (ref 37–153)
BUN: 18 mg/dL (ref 7–25)
CO2: 25 mmol/L (ref 20–32)
Calcium: 10.4 mg/dL (ref 8.6–10.4)
Chloride: 110 mmol/L (ref 98–110)
Creat: 0.99 mg/dL (ref 0.50–0.99)
GFR, Est African American: 67 mL/min/{1.73_m2} (ref >=60)
GFR, Est Non African American: 58 mL/min/{1.73_m2} — ABNORMAL LOW (ref >=60)
Globulin: 2.9 g/dL (calc) (ref 1.9–3.7)
Glucose, Bld: 109 mg/dL — ABNORMAL HIGH (ref 65–99)
Potassium: 4.4 mmol/L (ref 3.5–5.3)
Sodium: 144 mmol/L (ref 135–146)
Total Bilirubin: 0.4 mg/dL (ref 0.2–1.2)
Total Protein: 7.4 g/dL (ref 6.1–8.1)

## 2020-10-07 LAB — LIPID PANEL
Cholesterol: 186 mg/dL (ref <200)
HDL: 68 mg/dL (ref >=50)
LDL Cholesterol (Calc): 96 mg/dL (calc)
Non-HDL Cholesterol (Calc): 118 mg/dL (calc) (ref <130)
Total CHOL/HDL Ratio: 2.7 (calc) (ref <5.0)
Triglycerides: 121 mg/dL (ref <150)

## 2020-10-07 LAB — FERRITIN: Ferritin: 881 ng/mL — ABNORMAL HIGH (ref 16–288)

## 2020-10-07 LAB — MAGNESIUM: Magnesium: 2.1 mg/dL (ref 1.5–2.5)

## 2020-10-11 DIAGNOSIS — Z794 Long term (current) use of insulin: Secondary | ICD-10-CM | POA: Diagnosis not present

## 2020-10-11 DIAGNOSIS — E109 Type 1 diabetes mellitus without complications: Secondary | ICD-10-CM | POA: Diagnosis not present

## 2020-10-20 ENCOUNTER — Other Ambulatory Visit (HOSPITAL_COMMUNITY): Payer: Self-pay | Admitting: *Deleted

## 2020-10-20 ENCOUNTER — Other Ambulatory Visit: Payer: Self-pay | Admitting: Internal Medicine

## 2020-10-21 ENCOUNTER — Other Ambulatory Visit: Payer: Self-pay

## 2020-10-21 ENCOUNTER — Ambulatory Visit (HOSPITAL_COMMUNITY)
Admission: RE | Admit: 2020-10-21 | Discharge: 2020-10-21 | Disposition: A | Discharge status: Home / Self Care | Payer: Medicare PPO | Source: Ambulatory Visit | Attending: Internal Medicine | Admitting: Internal Medicine

## 2020-10-21 DIAGNOSIS — Z794 Long term (current) use of insulin: Secondary | ICD-10-CM | POA: Insufficient documentation

## 2020-10-21 DIAGNOSIS — B259 Cytomegaloviral disease, unspecified: Secondary | ICD-10-CM | POA: Diagnosis not present

## 2020-10-21 DIAGNOSIS — Z940 Kidney transplant status: Secondary | ICD-10-CM | POA: Insufficient documentation

## 2020-10-21 DIAGNOSIS — Z5181 Encounter for therapeutic drug level monitoring: Secondary | ICD-10-CM | POA: Insufficient documentation

## 2020-10-21 DIAGNOSIS — E119 Type 2 diabetes mellitus without complications: Secondary | ICD-10-CM | POA: Diagnosis not present

## 2020-10-21 LAB — CBC WITH DIFFERENTIAL/PLATELET
Abs Immature Granulocytes: 0.09 10*3/uL — ABNORMAL HIGH (ref 0.00–0.07)
Basophils Absolute: 0 10*3/uL (ref 0.0–0.1)
Basophils Relative: 1 %
Eosinophils Absolute: 0.1 10*3/uL (ref 0.0–0.5)
Eosinophils Relative: 2 %
HCT: 40.9 % (ref 36.0–46.0)
Hemoglobin: 12.6 g/dL (ref 12.0–15.0)
Immature Granulocytes: 3 %
Lymphocytes Relative: 32 %
Lymphs Abs: 1 10*3/uL (ref 0.7–4.0)
MCH: 25.4 pg — ABNORMAL LOW (ref 26.0–34.0)
MCHC: 30.8 g/dL (ref 30.0–36.0)
MCV: 82.3 fL (ref 80.0–100.0)
Monocytes Absolute: 0.4 10*3/uL (ref 0.1–1.0)
Monocytes Relative: 14 %
Neutro Abs: 1.5 10*3/uL — ABNORMAL LOW (ref 1.7–7.7)
Neutrophils Relative %: 48 %
Platelets: 208 10*3/uL (ref 150–400)
RBC: 4.97 MIL/uL (ref 3.87–5.11)
RDW: 19.4 % — ABNORMAL HIGH (ref 11.5–15.5)
WBC: 3 10*3/uL — ABNORMAL LOW (ref 4.0–10.5)
nRBC: 0 % (ref 0.0–0.2)

## 2020-10-21 LAB — BASIC METABOLIC PANEL
Anion gap: 7 (ref 5–15)
BUN: 19 mg/dL (ref 8–23)
CO2: 23 mmol/L (ref 22–32)
Calcium: 9.7 mg/dL (ref 8.9–10.3)
Chloride: 113 mmol/L — ABNORMAL HIGH (ref 98–111)
Creatinine, Ser: 0.99 mg/dL (ref 0.44–1.00)
GFR, Estimated: >60 mL/min (ref >60)
Glucose, Bld: 111 mg/dL — ABNORMAL HIGH (ref 70–99)
Potassium: 4.8 mmol/L (ref 3.5–5.1)
Sodium: 143 mmol/L (ref 135–145)

## 2020-10-21 LAB — URINALYSIS, COMPLETE (UACMP) WITH MICROSCOPIC
Bilirubin Urine: NEGATIVE
Glucose, UA: NEGATIVE mg/dL
Ketones, ur: NEGATIVE mg/dL
Nitrite: NEGATIVE
Protein, ur: 30 mg/dL — AB
Specific Gravity, Urine: 1.017 (ref 1.005–1.030)
WBC, UA: >50 WBC/hpf — ABNORMAL HIGH (ref 0–5)
pH: 5 (ref 5.0–8.0)

## 2020-10-21 LAB — PROTEIN / CREATININE RATIO, URINE
Creatinine, Urine: 106.98 mg/dL
Protein Creatinine Ratio: 0.15 mg/mg{Cre} (ref 0.00–0.15)
Total Protein, Urine: 16 mg/dL

## 2020-10-21 MED ORDER — SODIUM CHLORIDE 0.9 % IV SOLN
5.0000 mg/kg | INTRAVENOUS | Status: DC
  Administered 2020-10-21: 375 mg via INTRAVENOUS
  Filled 2020-10-21: qty 375

## 2020-10-25 LAB — SIROLIMUS LEVEL: Sirolimus (Rapamycin): 3.7 ng/mL (ref 3.0–20.0)

## 2020-11-10 DIAGNOSIS — E109 Type 1 diabetes mellitus without complications: Secondary | ICD-10-CM | POA: Diagnosis not present

## 2020-11-10 DIAGNOSIS — Z794 Long term (current) use of insulin: Secondary | ICD-10-CM | POA: Diagnosis not present

## 2020-11-11 DIAGNOSIS — Z940 Kidney transplant status: Secondary | ICD-10-CM | POA: Diagnosis not present

## 2020-11-11 DIAGNOSIS — Z5181 Encounter for therapeutic drug level monitoring: Secondary | ICD-10-CM | POA: Diagnosis not present

## 2020-11-11 DIAGNOSIS — N390 Urinary tract infection, site not specified: Secondary | ICD-10-CM | POA: Diagnosis not present

## 2020-11-12 ENCOUNTER — Encounter: Payer: Self-pay | Admitting: Podiatry

## 2020-11-12 ENCOUNTER — Other Ambulatory Visit: Payer: Self-pay

## 2020-11-12 ENCOUNTER — Ambulatory Visit (INDEPENDENT_AMBULATORY_CARE_PROVIDER_SITE_OTHER): Payer: Medicare PPO | Admitting: Podiatry

## 2020-11-12 DIAGNOSIS — B351 Tinea unguium: Secondary | ICD-10-CM

## 2020-11-12 DIAGNOSIS — M79674 Pain in right toe(s): Secondary | ICD-10-CM

## 2020-11-12 DIAGNOSIS — Z940 Kidney transplant status: Secondary | ICD-10-CM | POA: Diagnosis not present

## 2020-11-12 DIAGNOSIS — E1142 Type 2 diabetes mellitus with diabetic polyneuropathy: Secondary | ICD-10-CM | POA: Diagnosis not present

## 2020-11-12 DIAGNOSIS — M79675 Pain in left toe(s): Secondary | ICD-10-CM

## 2020-11-12 NOTE — Progress Notes (Signed)
This patient returns to my office for at risk foot care.  This patient requires this care by a professional since this patient will be at risk due to having diabetic neuropathy.  Patient doing well since her kidney transplant.  This patient is unable to cut her  nails  since the patient cannot reach her nails.These nails are painful walking and wearing shoes.  This patient presents for at risk foot care today.  General Appearance  Alert, conversant and in no acute stress.  Vascular  Dorsalis pedis and posterior tibial  pulses are palpable  bilaterally.  Capillary return is within normal limits  bilaterally. Temperature is within normal limits  bilaterally.  Neurologic  Senn-Weinstein monofilament wire test diminished   bilaterally. Muscle power within normal limits bilaterally.  Nails Thick disfigured discolored nails with subungual debris  from hallux to fifth toes bilaterally. No evidence of bacterial infection or drainage bilaterally.  Orthopedic  No limitations of motion  feet .  No crepitus or effusions noted.  No bony pathology or digital deformities noted.  Skin  normotropic skin with no porokeratosis noted bilaterally.  No signs of infections or ulcers noted.     Onychomycosis  Pain in right toes  Pain in left toes  Consent was obtained for treatment procedures.   Mechanical debridement of nails 1-5  bilaterally performed with a nail nipper.  Filed with dremel without incident.    Return office visit  4 months                      Told patient to return for periodic foot care and evaluation due to potential at risk complications.   Gardiner Barefoot DPM

## 2020-11-18 ENCOUNTER — Inpatient Hospital Stay (HOSPITAL_COMMUNITY): Admission: RE | Admit: 2020-11-18 | Payer: Medicare PPO | Source: Ambulatory Visit

## 2020-11-18 DIAGNOSIS — U071 COVID-19: Secondary | ICD-10-CM | POA: Diagnosis not present

## 2020-11-24 ENCOUNTER — Ambulatory Visit (HOSPITAL_COMMUNITY)
Admission: RE | Admit: 2020-11-24 | Discharge: 2020-11-24 | Disposition: A | Discharge status: Home / Self Care | Payer: Medicare PPO | Source: Ambulatory Visit | Attending: Internal Medicine | Admitting: Internal Medicine

## 2020-11-24 ENCOUNTER — Other Ambulatory Visit: Payer: Self-pay

## 2020-11-24 DIAGNOSIS — Z5181 Encounter for therapeutic drug level monitoring: Secondary | ICD-10-CM | POA: Diagnosis not present

## 2020-11-24 DIAGNOSIS — Z940 Kidney transplant status: Secondary | ICD-10-CM | POA: Insufficient documentation

## 2020-11-24 LAB — CBC WITH DIFFERENTIAL/PLATELET
Abs Immature Granulocytes: 0.06 10*3/uL (ref 0.00–0.07)
Basophils Absolute: 0 10*3/uL (ref 0.0–0.1)
Basophils Relative: 0 %
Eosinophils Absolute: 0.1 10*3/uL (ref 0.0–0.5)
Eosinophils Relative: 4 %
HCT: 37.2 % (ref 36.0–46.0)
Hemoglobin: 11.6 g/dL — ABNORMAL LOW (ref 12.0–15.0)
Immature Granulocytes: 2 %
Lymphocytes Relative: 35 %
Lymphs Abs: 0.9 10*3/uL (ref 0.7–4.0)
MCH: 25.6 pg — ABNORMAL LOW (ref 26.0–34.0)
MCHC: 31.2 g/dL (ref 30.0–36.0)
MCV: 82.1 fL (ref 80.0–100.0)
Monocytes Absolute: 0.3 10*3/uL (ref 0.1–1.0)
Monocytes Relative: 10 %
Neutro Abs: 1.3 10*3/uL — ABNORMAL LOW (ref 1.7–7.7)
Neutrophils Relative %: 49 %
Platelets: 184 10*3/uL (ref 150–400)
RBC: 4.53 MIL/uL (ref 3.87–5.11)
RDW: 18.3 % — ABNORMAL HIGH (ref 11.5–15.5)
WBC: 2.6 10*3/uL — ABNORMAL LOW (ref 4.0–10.5)
nRBC: 0 % (ref 0.0–0.2)

## 2020-11-24 LAB — URINALYSIS, COMPLETE (UACMP) WITH MICROSCOPIC
Bacteria, UA: NONE SEEN
Bilirubin Urine: NEGATIVE
Glucose, UA: NEGATIVE mg/dL
Hgb urine dipstick: NEGATIVE
Ketones, ur: NEGATIVE mg/dL
Nitrite: NEGATIVE
Protein, ur: NEGATIVE mg/dL
Specific Gravity, Urine: 1.018 (ref 1.005–1.030)
pH: 5 (ref 5.0–8.0)

## 2020-11-24 LAB — BASIC METABOLIC PANEL
Anion gap: 7 (ref 5–15)
BUN: 20 mg/dL (ref 8–23)
CO2: 22 mmol/L (ref 22–32)
Calcium: 9.4 mg/dL (ref 8.9–10.3)
Chloride: 110 mmol/L (ref 98–111)
Creatinine, Ser: 1.24 mg/dL — ABNORMAL HIGH (ref 0.44–1.00)
GFR, Estimated: 47 mL/min — ABNORMAL LOW (ref >60)
Glucose, Bld: 183 mg/dL — ABNORMAL HIGH (ref 70–99)
Potassium: 4.3 mmol/L (ref 3.5–5.1)
Sodium: 139 mmol/L (ref 135–145)

## 2020-11-24 LAB — PROTEIN / CREATININE RATIO, URINE
Creatinine, Urine: 140.88 mg/dL
Protein Creatinine Ratio: 0.16 mg/mg{Cre} — ABNORMAL HIGH (ref 0.00–0.15)
Total Protein, Urine: 22 mg/dL

## 2020-11-24 MED ORDER — SODIUM CHLORIDE 0.9 % IV SOLN
5.0000 mg/kg | INTRAVENOUS | Status: DC
  Filled 2020-11-24: qty 362.5

## 2020-11-24 MED ORDER — SODIUM CHLORIDE 0.9 % IV SOLN
5.0000 mg/kg | INTRAVENOUS | Status: DC
  Administered 2020-11-24: 387.5 mg via INTRAVENOUS
  Filled 2020-11-24: qty 387.5

## 2020-11-24 NOTE — Progress Notes (Signed)
Spoke with Baird Cancer, coordinator at [facility] for kidney transplants and notified her that client has night sweats and excessive tiredness and per Baird Cancer ok to proceed with infusion

## 2020-11-24 NOTE — Progress Notes (Addendum)
Client states has had night sweats and extreme tiredness for past week; states had COVID last week; call in to Dr on call for kidney transplant clients at [facility] and client has call in to coordinator at [facility]

## 2020-11-26 LAB — SIROLIMUS LEVEL: Sirolimus (Rapamycin): 45 ng/mL — ABNORMAL HIGH (ref 3.0–20.0)

## 2020-12-10 DIAGNOSIS — E109 Type 1 diabetes mellitus without complications: Secondary | ICD-10-CM | POA: Diagnosis not present

## 2020-12-10 DIAGNOSIS — Z794 Long term (current) use of insulin: Secondary | ICD-10-CM | POA: Diagnosis not present

## 2020-12-22 ENCOUNTER — Other Ambulatory Visit: Payer: Self-pay

## 2020-12-22 ENCOUNTER — Ambulatory Visit (HOSPITAL_COMMUNITY)
Admission: RE | Admit: 2020-12-22 | Discharge: 2020-12-22 | Disposition: A | Discharge status: Home / Self Care | Payer: Medicare PPO | Source: Ambulatory Visit | Attending: Internal Medicine | Admitting: Internal Medicine

## 2020-12-22 DIAGNOSIS — Z5181 Encounter for therapeutic drug level monitoring: Secondary | ICD-10-CM | POA: Diagnosis not present

## 2020-12-22 DIAGNOSIS — Z940 Kidney transplant status: Secondary | ICD-10-CM | POA: Diagnosis not present

## 2020-12-22 LAB — CBC WITH DIFFERENTIAL/PLATELET
Abs Immature Granulocytes: 0 10*3/uL (ref 0.00–0.07)
Basophils Absolute: 0 10*3/uL (ref 0.0–0.1)
Basophils Relative: 0 %
Eosinophils Absolute: 0.2 10*3/uL (ref 0.0–0.5)
Eosinophils Relative: 5 %
HCT: 34 % — ABNORMAL LOW (ref 36.0–46.0)
Hemoglobin: 10.4 g/dL — ABNORMAL LOW (ref 12.0–15.0)
Lymphocytes Relative: 21 %
Lymphs Abs: 0.8 10*3/uL (ref 0.7–4.0)
MCH: 25.4 pg — ABNORMAL LOW (ref 26.0–34.0)
MCHC: 30.6 g/dL (ref 30.0–36.0)
MCV: 83.1 fL (ref 80.0–100.0)
Monocytes Absolute: 0.6 10*3/uL (ref 0.1–1.0)
Monocytes Relative: 16 %
Neutro Abs: 2.1 10*3/uL (ref 1.7–7.7)
Neutrophils Relative %: 58 %
Platelets: 231 10*3/uL (ref 150–400)
RBC: 4.09 MIL/uL (ref 3.87–5.11)
RDW: 18.9 % — ABNORMAL HIGH (ref 11.5–15.5)
WBC: 3.7 10*3/uL — ABNORMAL LOW (ref 4.0–10.5)
nRBC: 0 % (ref 0.0–0.2)
nRBC: 1 /100 WBC — ABNORMAL HIGH

## 2020-12-22 LAB — URINALYSIS, COMPLETE (UACMP) WITH MICROSCOPIC
Bilirubin Urine: NEGATIVE
Glucose, UA: NEGATIVE mg/dL
Hgb urine dipstick: NEGATIVE
Ketones, ur: NEGATIVE mg/dL
Nitrite: NEGATIVE
Protein, ur: NEGATIVE mg/dL
Specific Gravity, Urine: 1.018 (ref 1.005–1.030)
pH: 5 (ref 5.0–8.0)

## 2020-12-22 LAB — PROTEIN / CREATININE RATIO, URINE
Creatinine, Urine: 133.34 mg/dL
Protein Creatinine Ratio: 0.08 mg/mg{Cre} (ref 0.00–0.15)
Total Protein, Urine: 11 mg/dL

## 2020-12-22 LAB — BASIC METABOLIC PANEL
Anion gap: 5 (ref 5–15)
BUN: 20 mg/dL (ref 8–23)
CO2: 23 mmol/L (ref 22–32)
Calcium: 9.5 mg/dL (ref 8.9–10.3)
Chloride: 114 mmol/L — ABNORMAL HIGH (ref 98–111)
Creatinine, Ser: 0.99 mg/dL (ref 0.44–1.00)
GFR, Estimated: >60 mL/min (ref >60)
Glucose, Bld: 102 mg/dL — ABNORMAL HIGH (ref 70–99)
Potassium: 4 mmol/L (ref 3.5–5.1)
Sodium: 142 mmol/L (ref 135–145)

## 2020-12-22 MED ORDER — SODIUM CHLORIDE 0.9 % IV SOLN
5.0000 mg/kg | INTRAVENOUS | Status: DC
  Administered 2020-12-22: 387.5 mg via INTRAVENOUS
  Filled 2020-12-22: qty 387.5

## 2020-12-24 LAB — SIROLIMUS LEVEL: Sirolimus (Rapamycin): 5 ng/mL (ref 3.0–20.0)

## 2021-01-05 DIAGNOSIS — M1711 Unilateral primary osteoarthritis, right knee: Secondary | ICD-10-CM | POA: Diagnosis not present

## 2021-01-10 DIAGNOSIS — Z794 Long term (current) use of insulin: Secondary | ICD-10-CM | POA: Diagnosis not present

## 2021-01-10 DIAGNOSIS — E109 Type 1 diabetes mellitus without complications: Secondary | ICD-10-CM | POA: Diagnosis not present

## 2021-01-19 ENCOUNTER — Ambulatory Visit (HOSPITAL_COMMUNITY)
Admission: RE | Admit: 2021-01-19 | Discharge: 2021-01-19 | Disposition: A | Discharge status: Home / Self Care | Payer: Medicare PPO | Source: Ambulatory Visit | Attending: Internal Medicine | Admitting: Internal Medicine

## 2021-01-19 DIAGNOSIS — Z940 Kidney transplant status: Secondary | ICD-10-CM | POA: Insufficient documentation

## 2021-01-19 LAB — CBC WITH DIFFERENTIAL/PLATELET
Abs Immature Granulocytes: 0.25 10*3/uL — ABNORMAL HIGH (ref 0.00–0.07)
Basophils Absolute: 0 10*3/uL (ref 0.0–0.1)
Basophils Relative: 1 %
Eosinophils Absolute: 0.1 10*3/uL (ref 0.0–0.5)
Eosinophils Relative: 2 %
HCT: 36.6 % (ref 36.0–46.0)
Hemoglobin: 11.2 g/dL — ABNORMAL LOW (ref 12.0–15.0)
Immature Granulocytes: 7 %
Lymphocytes Relative: 31 %
Lymphs Abs: 1.1 10*3/uL (ref 0.7–4.0)
MCH: 25.7 pg — ABNORMAL LOW (ref 26.0–34.0)
MCHC: 30.6 g/dL (ref 30.0–36.0)
MCV: 84.1 fL (ref 80.0–100.0)
Monocytes Absolute: 0.5 10*3/uL (ref 0.1–1.0)
Monocytes Relative: 15 %
Neutro Abs: 1.5 10*3/uL — ABNORMAL LOW (ref 1.7–7.7)
Neutrophils Relative %: 44 %
Platelets: 240 10*3/uL (ref 150–400)
RBC: 4.35 MIL/uL (ref 3.87–5.11)
RDW: 19.2 % — ABNORMAL HIGH (ref 11.5–15.5)
WBC: 3.5 10*3/uL — ABNORMAL LOW (ref 4.0–10.5)
nRBC: 0 % (ref 0.0–0.2)

## 2021-01-19 LAB — URINALYSIS, COMPLETE (UACMP) WITH MICROSCOPIC
Bilirubin Urine: NEGATIVE
Glucose, UA: NEGATIVE mg/dL
Hgb urine dipstick: NEGATIVE
Ketones, ur: NEGATIVE mg/dL
Nitrite: NEGATIVE
Protein, ur: NEGATIVE mg/dL
Specific Gravity, Urine: 1.025 (ref 1.005–1.030)
pH: 6 (ref 5.0–8.0)

## 2021-01-19 LAB — PROTEIN / CREATININE RATIO, URINE
Creatinine, Urine: 183.42 mg/dL
Protein Creatinine Ratio: 0.08 mg/mg{Cre} (ref 0.00–0.15)
Total Protein, Urine: 14 mg/dL

## 2021-01-19 LAB — BASIC METABOLIC PANEL
Anion gap: 7 (ref 5–15)
BUN: 19 mg/dL (ref 8–23)
CO2: 24 mmol/L (ref 22–32)
Calcium: 9.8 mg/dL (ref 8.9–10.3)
Chloride: 110 mmol/L (ref 98–111)
Creatinine, Ser: 0.93 mg/dL (ref 0.44–1.00)
GFR, Estimated: >60 mL/min (ref >60)
Glucose, Bld: 116 mg/dL — ABNORMAL HIGH (ref 70–99)
Potassium: 4.7 mmol/L (ref 3.5–5.1)
Sodium: 141 mmol/L (ref 135–145)

## 2021-01-19 MED ORDER — SODIUM CHLORIDE 0.9 % IV SOLN
5.0000 mg/kg | INTRAVENOUS | Status: DC
  Administered 2021-01-19: 387.5 mg via INTRAVENOUS
  Filled 2021-01-19: qty 387.5

## 2021-01-19 NOTE — Progress Notes (Signed)
Annual Screening/Preventative Visit & Comprehensive Evaluation &  Examination  Future Appointments  Date Time Provider [place]  01/20/2021 11:00 AM Unk Pinto, MD [facility] None  02/16/2021 11:00 AM [facility] None  03/18/2021  9:00 AM Gardiner Barefoot, DPM [facility]  10/06/2021    Wellness  11:30 AM Liane Comber, NP [facility] None  01/20/2022    CPE 10:00 AM Unk Pinto, MD [facility] None        This very nice 70 y.o. MBRF presents for a Screening /Preventative Visit & comprehensive evaluation and management of multiple medical co-morbidities.  Patient has been followed for HTN, HLD, T2_NIDDM  and Vitamin D Deficiency.                                      Patient had started Hemodialysis in 2016 and then 3  years ago in Sept 2019, patient received a cadaver donor Kidney Transplant at [facility]. Patient had hx/o hyperparathyroidism consequent od her kidney disease prior to her renal transplant.         HTN predates since  1998. Patient's BP has been controlled at home and patient denies any cardiac symptoms as chest pain, palpitations, shortness of breath, dizziness or ankle swelling. Today's BP is at goal -  118/70 .       Patient's hyperlipidemia is controlled with diet and medications. Patient denies myalgias or other medication SE's. Last lipids were   Lab Results  Component Value Date   CHOL 186 10/06/2020   HDL 68 10/06/2020   LDLCALC 96 10/06/2020   TRIG 121 10/06/2020   CHOLHDL 2.7 10/06/2020         Patient has hx/o T2_NIDDM (1998) & patient has hx/o ESR &  is blind from Diabetic Retinopathy. Patient is totally blind on the Lt with not even light perception and on the Right perceives hand motion & finger counting Patient denies reactive hypoglycemic symptoms, visual blurring, diabetic polys or paresthesias. til received kidney transplant (12/2017)   and patient denies reactive hypoglycemic symptoms, visual blurring, diabetic polys or  paresthesias. Last A1c was   Lab Results  Component Value Date   HGBA1C 7.9 (H) 07/29/2020                                         Patient also has Hypothyroidism consequent of   hx/o  Hashimoto's Dz at  age 71 (48) & has been on Thyroid replacement since.                               Finally, patient has history of Vitamin D Deficiency ("25" /2016) and last Vitamin D was still very low:  Lab Results  Component Value Date   VD25OH 26 (L) 01/16/2020     Current Outpatient Medications on File Prior to Visit  Medication Sig   amLODipine (NORVASC) 2.5 MG tablet Take one tablet twice a day   aspirin EC 81 MG tablet Take 81 mg by mouth daily.   belatacept (NULOJIX) 250 MG SOLR injection Inject into the vein.   carvedilol (COREG) 6.25 MG tablet Take 6.25 mg by mouth 2 (two) times daily with a meal.   Continuous Blood Gluc Receiver (FREESTYLE LIBRE 14 DAY READER) DEVI Use 1 Application as  directed   Continuous Blood Gluc Sensor (FREESTYLE LIBRE 14 DAY SENSOR) MISC Place 1 Device onto the skin every 14 (fourteen) days. DX-E11.22,n18.6,Z99.2   ezetimibe (ZETIA) 10 MG tablet Take 1 tablet (10 mg total) by mouth daily.   gemfibrozil (LOPID) 600 MG tablet Take 1 tablet 2 x /day for Triglycerides (Blood Fats)   glucose blood test strip 1 each.   insulin glargine (LANTUS SOLOSTAR) 100 UNIT/ML Solostar Pen INJECT 20 UNITS INTO THE SKIN DAILY FOR DIABETES   insulin lispro (HUMALOG) 100 UNIT/ML KwikPen    Insulin Pen Needle (FIFTY50 PEN NEEDLES) 31G X 5 MM MISC Test sugars Three to four times daily   Lancets Misc. (UNISTIK 2 NORMAL) MISC Use 1 each 4 (four) times daily Product selection permitted according to insurance preference. E11.9 Type 2 diabetes mellitus   levothyroxine (SYNTHROID) 125 MCG tablet Take 125 mcg by mouth. Takes 1 tablet daily except on Sundays.   linagliptin (TRADJENTA) 5 MG TABS tablet Take by mouth.   sirolimus (RAPAMUNE) 1 MG tablet Take    1 tablet     Daily     for  Kidney Transplant (Patient taking differently: Take 2 tablet     Daily     for Kidney Transplant)   Current Facility-Administered Medications on File Prior to Visit  Medication   belatacept (NULOJIX) 387.5 mg in sodium chloride 0.9 % 84.5 mL IVPB     Allergies  Allergen Reactions   Hydralazine Shortness Of Breath    Chest pain fatigue Chest pain   Azor [Amlodipine-Olmesartan] Other (See Comments)    Sharp chest pain  Sharp chest pain  Chest pain   Camellia Other (See Comments)   Denaverine Other (See Comments)   Diphenhydramine Other (See Comments)   Diphenhydramine Hcl Other (See Comments)   Hydrochlorothiazide Other (See Comments)   Hydrochlorothiazide W-Triamterene    Labetalol     Per patient, caused foot pain   Laureth Other (See Comments)   Lisinopril Other (See Comments)    Severe radiating foot pain.   Metformin Itching    Loss of bowel control, general sick feeling   Metoclopramide Other (See Comments)   Other Other (See Comments)    "contractions in throat" Diazide pt states cause throat to swell.   Prednisone Other (See Comments)    Elevated Glucose    Minoxidil Rash    Choking, neck contractions fatigue   Spironolactone Other (See Comments) and Rash    Severe radiating pain in feet Severe radiating pain in feet.   Statins Rash    Fatigue Cause fatigue     Past Medical History:  Diagnosis Date   Allergy    Blood transfusion without reported diagnosis    Cataract    bilateral   Diabetic retinopathy ([place])    End stage renal disease on dialysis due to type 2 diabetes mellitus ([place]) 03/22/2015   ESRD (end stage renal disease) ([place])    Hashimoto's disease    Hyperlipidemia    Hyperparathyroidism, secondary renal ([place]) 06/25/2015   Hypertension    Hypothyroid    Loss of vision    both eyes, no vision left eye and little in left      Health Maintenance  Topic Date Due   TETANUS/TDAP  Never done   INFLUENZA VACCINE  11/29/2020   COVID-19  Vaccine (5 - Booster for [place] series) 01/01/2021   FOOT EXAM  01/15/2021   HEMOGLOBIN A1C  01/28/2021   OPHTHALMOLOGY EXAM  03/17/2021   MAMMOGRAM  06/10/2022   COLONOSCOPY (Pts 45-52yr Insurance coverage will need to be confirmed)  08/22/2025   DEXA SCAN  Completed   Hepatitis C Screening  Completed   Zoster Vaccines- Shingrix  Completed   HPV VACCINES  Aged Out   URINE MICROALBUMIN  Discontinued     Immunization History  Administered Date(s) Administered   Influenza Split 01/19/2011   Influenza Whole 04/23/2007   Influenza, High Dose Seasonal PF 01/08/2019   Influenza, Seasonal, Injecte, Preservative Fre 01/25/2012, 01/30/2013, 03/05/2014   Influenza,inj,quad, With Preservative 03/10/2015   Influenza-Unspecified 01/05/2014, 02/28/2016, 01/08/2019   PFIZER(Purple Top)SARS-COV-2 Vaccination 06/07/2019, 06/28/2019, 12/30/2019, 08/31/2020   Pneumococcal Conjugate-13 03/06/2017   Pneumococcal Polysaccharide-23 02/22/2016   Pneumococcal-Unspecified 01/05/2010   Zoster Recombinat (Shingrix) 03/26/2019, 07/14/2019   Zoster, Live 07/14/2019     Last Colon - 08/23/2015- Dr DLoletha Carrow- recc 10 yr f/u - due Apr 2027   Last MGM - 06/15/2020   Past Surgical History:  Procedure Laterality Date   ABDOMINAL HYSTERECTOMY     BREAST BIOPSY Right 2015   benign   COLONOSCOPY     EYE SURGERY     KNEE SURGERY       Family History  Problem Relation Age of Onset   Breast cancer Sister    CAD Other        No family history   Colon polyps Maternal Aunt    Breast cancer Maternal Aunt    Colon polyps Maternal Uncle    Breast cancer Maternal Grandmother    Breast cancer Maternal Aunt    Colon cancer Neg Hx      Social History   Tobacco Use   Smoking status: Never   Smokeless tobacco: Never  Substance Use Topics   Alcohol use: No      ROS Constitutional: Denies fever, chills, weight loss/gain, headaches, insomnia,  night sweats, and change in appetite. Does c/o  fatigue. Eyes: Denies redness, blurred vision, diplopia, discharge, itchy, watery eyes.  ENT: Denies discharge, congestion, post nasal drip, epistaxis, sore throat, earache, hearing loss, dental pain, Tinnitus, Vertigo, Sinus pain, snoring.  Cardio: Denies chest pain, palpitations, irregular heartbeat, syncope, dyspnea, diaphoresis, orthopnea, PND, claudication, edema Respiratory: denies cough, dyspnea, DOE, pleurisy, hoarseness, laryngitis, wheezing.  Gastrointestinal: Denies dysphagia, heartburn, reflux, water brash, pain, cramps, nausea, vomiting, bloating, diarrhea, constipation, hematemesis, melena, hematochezia, jaundice, hemorrhoids Genitourinary: Denies dysuria, frequency, urgency, nocturia, hesitancy, discharge, hematuria, flank pain Breast: Breast lumps, nipple discharge, bleeding.  Musculoskeletal: Denies arthralgia, myalgia, stiffness, Jt. Swelling, pain, limp, and strain/sprain. Denies falls. Skin: Denies puritis, rash, hives, warts, acne, eczema, changing in skin lesion Neuro: No weakness, tremor, incoordination, spasms, paresthesia, pain Psychiatric: Denies confusion, memory loss, sensory loss. Denies Depression. Endocrine: Denies change in weight, skin, hair change, nocturia, and paresthesia, diabetic polys, visual blurring, hyper / hypo glycemic episodes.  Heme/Lymph: No excessive bleeding, bruising, enlarged lymph nodes.  Physical Exam  BP 118/70   Pulse 64   Temp 97.6 F (36.4 C)   Resp 16   Ht 5' 6"  (1.676 m)   Wt 168 lb 3.2 oz (76.3 kg)   LMP  (LMP Unknown)   SpO2 98%   BMI 27.15 kg/m   General Appearance: well nourished, well groomed and in no apparent distress.  Eyes: EOMs full, Bilat IOLens. Fundi not well visualized.  Total blind Lt Eye and Right eye intact to hand motion/finger counting  & hand motion. Sinuses: No frontal/maxillary tenderness ENT/Mouth: EACs patent / TMs  nl. Nares clear without erythema, swelling, mucoid  exudates. Oral hygiene is good. No  erythema, swelling, or exudate. Tongue normal, non-obstructing. Tonsils not swollen or erythematous. Hearing normal.  Neck: Supple, thyroid not palpable. No bruits, nodes or JVD. Respiratory: Respiratory effort normal.  BS equal and clear bilateral without rales, rhonci, wheezing or stridor. Cardio: Heart sounds are normal with regular rate and rhythm and no murmurs, rubs or gallops. Peripheral pulses are normal and equal bilaterally without edema. No aortic or femoral bruits. Chest: symmetric with normal excursions and percussion. Breasts: Symmetric, without lumps, nipple discharge, retractions, or fibrocystic changes.  Abdomen: Flat, soft with bowel sounds active. Nontender, no guarding, rebound, hernias, masses, or organomegaly.  Lymphatics: Non tender without lymphadenopathy.  Musculoskeletal: Full ROM all peripheral extremities, joint stability, 5/5 strength, and normal gait. Skin: Warm and dry without rashes, lesions, cyanosis, clubbing or  ecchymosis.  Neuro: Cranial nerves intact, reflexes equal bilaterally. Normal muscle tone, no cerebellar symptoms. Sensation intact.  Pysch: Alert and oriented x 3, normal affect, Insight and Judgment appropriate.    Assessment and Plan  1. Annual Preventative Screening Examination  2. Essential hypertension  - EKG 12-Lead - Urinalysis, Routine w reflex microscopic - Microalbumin / creatinine urine ratio - CBC with Differential/Platelet - COMPLETE METABOLIC PANEL WITH GFR - Magnesium - TSH  3. Hyperlipidemia associated with type 2 diabetes mellitus ([place])  - EKG 12-Lead - Lipid panel - TSH  4. Type 2 diabetes mellitus with stage 2 chronic kidney disease, with long-term current use of insulin ([facility])  - EKG 12-Lead - HM DIABETES FOOT EXAM - LOW EXTREMITY NEUR EXAM DOCUM - Hemoglobin A1c  5. Vitamin D deficiency  - VITAMIN D 25 Hydroxy   6. Kidney replaced by transplant  - COMPLETE METABOLIC PANEL WITH GFR  7. Aortic  atherosclerosis ([place]) by CT scan in 2017  - EKG 12-Lead - Lipid panel  8. Gastroesophageal reflux disease with esophagitis without hemorrhage   9. Statin myopathy   10. Screening for colorectal cancer  - POC Hemoccult Bld/Stl   11. Screening for ischemic heart disease  - EKG 12-Lead  12. FHx: heart disease  - EKG 12-Lead  13. Medication management  - Urinalysis, Routine w reflex microscopic - Microalbumin / creatinine urine ratio - CBC with Differential/Platelet - COMPLETE METABOLIC PANEL WITH GFR - Magnesium - Lipid panel - TSH - Hemoglobin A1c - VITAMIN D 25 Hydroxy      Patient was counseled in prudent diet to achieve/maintain BMI less than 25 for weight control, BP monitoring, regular exercise and medications. Discussed med's effects and SE's. Screening labs and tests as requested with regular follow-up as recommended. Over 40 minutes of exam, counseling, chart review and high complex critical decision making was performed.   Kirtland Bouchard, MD

## 2021-01-20 ENCOUNTER — Other Ambulatory Visit: Payer: Self-pay

## 2021-01-20 ENCOUNTER — Encounter: Payer: Self-pay | Admitting: Internal Medicine

## 2021-01-20 ENCOUNTER — Ambulatory Visit (INDEPENDENT_AMBULATORY_CARE_PROVIDER_SITE_OTHER): Payer: Medicare PPO | Admitting: Internal Medicine

## 2021-01-20 VITALS — BP 118/70 | HR 64 | Temp 97.6°F | Resp 16 | Ht 66.0 in | Wt 168.2 lb

## 2021-01-20 DIAGNOSIS — K2100 Gastro-esophageal reflux disease with esophagitis, without bleeding: Secondary | ICD-10-CM

## 2021-01-20 DIAGNOSIS — I700 Atherosclerosis of aorta: Secondary | ICD-10-CM

## 2021-01-20 DIAGNOSIS — Z1211 Encounter for screening for malignant neoplasm of colon: Secondary | ICD-10-CM

## 2021-01-20 DIAGNOSIS — E785 Hyperlipidemia, unspecified: Secondary | ICD-10-CM | POA: Diagnosis not present

## 2021-01-20 DIAGNOSIS — Z794 Long term (current) use of insulin: Secondary | ICD-10-CM | POA: Diagnosis not present

## 2021-01-20 DIAGNOSIS — Z79899 Other long term (current) drug therapy: Secondary | ICD-10-CM | POA: Diagnosis not present

## 2021-01-20 DIAGNOSIS — N182 Chronic kidney disease, stage 2 (mild): Secondary | ICD-10-CM | POA: Diagnosis not present

## 2021-01-20 DIAGNOSIS — Z940 Kidney transplant status: Secondary | ICD-10-CM | POA: Diagnosis not present

## 2021-01-20 DIAGNOSIS — E1122 Type 2 diabetes mellitus with diabetic chronic kidney disease: Secondary | ICD-10-CM

## 2021-01-20 DIAGNOSIS — Z0001 Encounter for general adult medical examination with abnormal findings: Secondary | ICD-10-CM

## 2021-01-20 DIAGNOSIS — N2581 Secondary hyperparathyroidism of renal origin: Secondary | ICD-10-CM

## 2021-01-20 DIAGNOSIS — G720 Drug-induced myopathy: Secondary | ICD-10-CM

## 2021-01-20 DIAGNOSIS — E559 Vitamin D deficiency, unspecified: Secondary | ICD-10-CM

## 2021-01-20 DIAGNOSIS — Z136 Encounter for screening for cardiovascular disorders: Secondary | ICD-10-CM | POA: Diagnosis not present

## 2021-01-20 DIAGNOSIS — Z8249 Family history of ischemic heart disease and other diseases of the circulatory system: Secondary | ICD-10-CM

## 2021-01-20 DIAGNOSIS — E1169 Type 2 diabetes mellitus with other specified complication: Secondary | ICD-10-CM | POA: Diagnosis not present

## 2021-01-20 DIAGNOSIS — I10 Essential (primary) hypertension: Secondary | ICD-10-CM | POA: Diagnosis not present

## 2021-01-20 DIAGNOSIS — Z0000 Encounter for general adult medical examination without abnormal findings: Secondary | ICD-10-CM

## 2021-01-20 NOTE — Patient Instructions (Signed)

## 2021-01-21 LAB — URINALYSIS, ROUTINE W REFLEX MICROSCOPIC
Bacteria, UA: NONE SEEN /HPF
Bilirubin Urine: NEGATIVE
Glucose, UA: NEGATIVE
Hgb urine dipstick: NEGATIVE
Hyaline Cast: NONE SEEN /LPF
Ketones, ur: NEGATIVE
Nitrite: NEGATIVE
Protein, ur: NEGATIVE
RBC / HPF: NONE SEEN /HPF (ref 0–2)
Specific Gravity, Urine: 1.021 (ref 1.001–1.035)
pH: 5.5 (ref 5.0–8.0)

## 2021-01-21 LAB — CBC WITH DIFFERENTIAL/PLATELET
Absolute Monocytes: 494 cells/uL (ref 200–950)
Basophils Absolute: 21 cells/uL (ref 0–200)
Basophils Relative: 0.6 %
Eosinophils Absolute: 98 cells/uL (ref 15–500)
Eosinophils Relative: 2.8 %
HCT: 35 % (ref 35.0–45.0)
Hemoglobin: 11.1 g/dL — ABNORMAL LOW (ref 11.7–15.5)
Lymphs Abs: 1106 cells/uL (ref 850–3900)
MCH: 25.6 pg — ABNORMAL LOW (ref 27.0–33.0)
MCHC: 31.7 g/dL — ABNORMAL LOW (ref 32.0–36.0)
MCV: 80.6 fL (ref 80.0–100.0)
MPV: 10.7 fL (ref 7.5–12.5)
Monocytes Relative: 14.1 %
Neutro Abs: 1782 cells/uL (ref 1500–7800)
Neutrophils Relative %: 50.9 %
Platelets: 265 10*3/uL (ref 140–400)
RBC: 4.34 10*6/uL (ref 3.80–5.10)
RDW: 17.6 % — ABNORMAL HIGH (ref 11.0–15.0)
Total Lymphocyte: 31.6 %
WBC: 3.5 10*3/uL — ABNORMAL LOW (ref 3.8–10.8)

## 2021-01-21 LAB — MAGNESIUM: Magnesium: 2.2 mg/dL (ref 1.5–2.5)

## 2021-01-21 LAB — HEMOGLOBIN A1C
Hgb A1c MFr Bld: 7.7 % of total Hgb — ABNORMAL HIGH (ref <5.7)
Mean Plasma Glucose: 174 mg/dL
eAG (mmol/L): 9.7 mmol/L

## 2021-01-21 LAB — COMPLETE METABOLIC PANEL WITH GFR
AG Ratio: 1.6 (calc) (ref 1.0–2.5)
ALT: 10 U/L (ref 6–29)
AST: 16 U/L (ref 10–35)
Albumin: 4.4 g/dL (ref 3.6–5.1)
Alkaline phosphatase (APISO): 81 U/L (ref 37–153)
BUN: 21 mg/dL (ref 7–25)
CO2: 24 mmol/L (ref 20–32)
Calcium: 10.5 mg/dL — ABNORMAL HIGH (ref 8.6–10.4)
Chloride: 110 mmol/L (ref 98–110)
Creat: 0.93 mg/dL (ref 0.50–1.05)
Globulin: 2.8 g/dL (calc) (ref 1.9–3.7)
Glucose, Bld: 98 mg/dL (ref 65–99)
Potassium: 4.7 mmol/L (ref 3.5–5.3)
Sodium: 141 mmol/L (ref 135–146)
Total Bilirubin: 0.3 mg/dL (ref 0.2–1.2)
Total Protein: 7.2 g/dL (ref 6.1–8.1)
eGFR: 67 mL/min/{1.73_m2} (ref >=60)

## 2021-01-21 LAB — LIPID PANEL
Cholesterol: 203 mg/dL — ABNORMAL HIGH (ref <200)
HDL: 67 mg/dL (ref >=50)
LDL Cholesterol (Calc): 111 mg/dL (calc) — ABNORMAL HIGH
Non-HDL Cholesterol (Calc): 136 mg/dL (calc) — ABNORMAL HIGH (ref <130)
Total CHOL/HDL Ratio: 3 (calc) (ref <5.0)
Triglycerides: 133 mg/dL (ref <150)

## 2021-01-21 LAB — MICROSCOPIC MESSAGE

## 2021-01-21 LAB — TSH: TSH: 1.96 mIU/L (ref 0.40–4.50)

## 2021-01-21 LAB — MICROALBUMIN / CREATININE URINE RATIO
Creatinine, Urine: 124 mg/dL (ref 20–275)
Microalb Creat Ratio: 19 mcg/mg creat (ref <30)
Microalb, Ur: 2.3 mg/dL

## 2021-01-21 NOTE — Progress Notes (Signed)
============================================================ ============================================================  -    U/A - Normal - Looks like kidneys filtering OK w/o sign of infection ============================================================ ============================================================  -  CBC still shows a very mild anemia, which is stable  ============================================================ ============================================================  -  Total Chol = 203 - elevated          (  Ideal or goal is less than 180)  - and   - Bad LDL Chol = 11 - also very elevated           (  Ideal or Goal is less than 70  !  )   - So. . . . . .  Recommend a stricter low cholesterol diet   - Cholesterol only comes from animal sources  - ie. meat, dairy, egg yolks  - Eat all the vegetables you want.  - Avoid meat, especially red meat - Beef AND Pork .  - Avoid cheese & dairy - milk & ice cream.     - Cheese is the most concentrated form of trans-fats which  is the worst thing to clog up our arteries.   - Veggie cheese is OK which can be found in the fresh  produce section at [employer] or [employer] or [employer] ============================================================ ============================================================  -  A1c 7.7%  - Also too elevated   - It is very important that you work harder with diet by  avoiding all foods that are white except chicken, fish & calliflower.  - Avoid white rice  (brown & wild rice is OK),   - Avoid white potatoes  (sweet potatoes in moderation is OK),   White bread or wheat bread or anything made out of   white flour like bagels, donuts, rolls, buns, biscuits, cakes,  - pastries, cookies, pizza crust, and pasta (made from  white flour & egg whites)   - vegetarian pasta or spinach or wheat pasta is OK.  - Multigrain breads like Arnold's, Pepperidge Farm or   multigrain  sandwich thins or high fiber breads like   [place] bread or "Dave's Killer" breads that are  4 to 5 grams fiber per slice !  are best.    ============================================================ ============================================================  -  All Else - CBC - Electrolytes - Liver - Magnesium & Thyroid    - all  Normal / OK ============================================================ ============================================================

## 2021-01-22 LAB — SIROLIMUS LEVEL: Sirolimus (Rapamycin): 4 ng/mL (ref 3.0–20.0)

## 2021-01-27 ENCOUNTER — Other Ambulatory Visit: Payer: Self-pay | Admitting: Internal Medicine

## 2021-01-27 DIAGNOSIS — E1169 Type 2 diabetes mellitus with other specified complication: Secondary | ICD-10-CM

## 2021-01-27 DIAGNOSIS — E785 Hyperlipidemia, unspecified: Secondary | ICD-10-CM

## 2021-02-09 DIAGNOSIS — Z794 Long term (current) use of insulin: Secondary | ICD-10-CM | POA: Diagnosis not present

## 2021-02-09 DIAGNOSIS — E108 Type 1 diabetes mellitus with unspecified complications: Secondary | ICD-10-CM | POA: Diagnosis not present

## 2021-02-16 ENCOUNTER — Other Ambulatory Visit: Payer: Self-pay

## 2021-02-16 ENCOUNTER — Ambulatory Visit (HOSPITAL_COMMUNITY)
Admission: RE | Admit: 2021-02-16 | Discharge: 2021-02-16 | Disposition: A | Discharge status: Home / Self Care | Payer: Medicare PPO | Source: Ambulatory Visit | Attending: Internal Medicine | Admitting: Internal Medicine

## 2021-02-16 VITALS — BP 147/49 | HR 66 | Temp 97.1°F | Resp 20 | Wt 165.0 lb

## 2021-02-16 DIAGNOSIS — I898 Other specified noninfective disorders of lymphatic vessels and lymph nodes: Secondary | ICD-10-CM | POA: Insufficient documentation

## 2021-02-16 DIAGNOSIS — T8699 Other complications of unspecified transplanted organ and tissue: Secondary | ICD-10-CM | POA: Insufficient documentation

## 2021-02-16 LAB — URINALYSIS, COMPLETE (UACMP) WITH MICROSCOPIC
Bacteria, UA: NONE SEEN
Bilirubin Urine: NEGATIVE
Glucose, UA: NEGATIVE mg/dL
Hgb urine dipstick: NEGATIVE
Ketones, ur: NEGATIVE mg/dL
Nitrite: NEGATIVE
Protein, ur: NEGATIVE mg/dL
Specific Gravity, Urine: 1.02 (ref 1.005–1.030)
pH: 5 (ref 5.0–8.0)

## 2021-02-16 LAB — CBC WITH DIFFERENTIAL/PLATELET
Abs Immature Granulocytes: 0.18 10*3/uL — ABNORMAL HIGH (ref 0.00–0.07)
Basophils Absolute: 0.1 10*3/uL (ref 0.0–0.1)
Basophils Relative: 1 %
Eosinophils Absolute: 0.1 10*3/uL (ref 0.0–0.5)
Eosinophils Relative: 2 %
HCT: 35.7 % — ABNORMAL LOW (ref 36.0–46.0)
Hemoglobin: 11 g/dL — ABNORMAL LOW (ref 12.0–15.0)
Immature Granulocytes: 5 %
Lymphocytes Relative: 34 %
Lymphs Abs: 1.3 10*3/uL (ref 0.7–4.0)
MCH: 26.1 pg (ref 26.0–34.0)
MCHC: 30.8 g/dL (ref 30.0–36.0)
MCV: 84.8 fL (ref 80.0–100.0)
Monocytes Absolute: 0.7 10*3/uL (ref 0.1–1.0)
Monocytes Relative: 18 %
Neutro Abs: 1.6 10*3/uL — ABNORMAL LOW (ref 1.7–7.7)
Neutrophils Relative %: 40 %
Platelets: 204 10*3/uL (ref 150–400)
RBC: 4.21 MIL/uL (ref 3.87–5.11)
RDW: 19.1 % — ABNORMAL HIGH (ref 11.5–15.5)
WBC: 3.9 10*3/uL — ABNORMAL LOW (ref 4.0–10.5)
nRBC: 0 % (ref 0.0–0.2)

## 2021-02-16 LAB — BASIC METABOLIC PANEL
Anion gap: 5 (ref 5–15)
BUN: 21 mg/dL (ref 8–23)
CO2: 24 mmol/L (ref 22–32)
Calcium: 9.7 mg/dL (ref 8.9–10.3)
Chloride: 112 mmol/L — ABNORMAL HIGH (ref 98–111)
Creatinine, Ser: 1.16 mg/dL — ABNORMAL HIGH (ref 0.44–1.00)
GFR, Estimated: 51 mL/min — ABNORMAL LOW (ref >60)
Glucose, Bld: 148 mg/dL — ABNORMAL HIGH (ref 70–99)
Potassium: 4.6 mmol/L (ref 3.5–5.1)
Sodium: 141 mmol/L (ref 135–145)

## 2021-02-16 LAB — PROTEIN / CREATININE RATIO, URINE
Creatinine, Urine: 154.13 mg/dL
Protein Creatinine Ratio: 0.07 mg/mg{Cre} (ref 0.00–0.15)
Total Protein, Urine: 11 mg/dL

## 2021-02-16 MED ORDER — SODIUM CHLORIDE 0.9 % IV SOLN
5.0000 mg/kg | INTRAVENOUS | Status: DC
  Administered 2021-02-16: 375 mg via INTRAVENOUS
  Filled 2021-02-16: qty 375

## 2021-02-19 LAB — SIROLIMUS LEVEL: Sirolimus (Rapamycin): 3.9 ng/mL (ref 3.0–20.0)

## 2021-03-04 DIAGNOSIS — M1711 Unilateral primary osteoarthritis, right knee: Secondary | ICD-10-CM | POA: Diagnosis not present

## 2021-03-07 DIAGNOSIS — E113513 Type 2 diabetes mellitus with proliferative diabetic retinopathy with macular edema, bilateral: Secondary | ICD-10-CM | POA: Diagnosis not present

## 2021-03-07 DIAGNOSIS — D8481 Immunodeficiency due to conditions classified elsewhere: Secondary | ICD-10-CM | POA: Diagnosis not present

## 2021-03-07 DIAGNOSIS — E785 Hyperlipidemia, unspecified: Secondary | ICD-10-CM | POA: Diagnosis not present

## 2021-03-07 DIAGNOSIS — I5032 Chronic diastolic (congestive) heart failure: Secondary | ICD-10-CM | POA: Diagnosis not present

## 2021-03-07 DIAGNOSIS — E663 Overweight: Secondary | ICD-10-CM | POA: Diagnosis not present

## 2021-03-07 DIAGNOSIS — E063 Autoimmune thyroiditis: Secondary | ICD-10-CM | POA: Diagnosis not present

## 2021-03-07 DIAGNOSIS — B351 Tinea unguium: Secondary | ICD-10-CM | POA: Diagnosis not present

## 2021-03-07 DIAGNOSIS — H548 Legal blindness, as defined in USA: Secondary | ICD-10-CM | POA: Diagnosis not present

## 2021-03-07 DIAGNOSIS — E039 Hypothyroidism, unspecified: Secondary | ICD-10-CM | POA: Diagnosis not present

## 2021-03-11 DIAGNOSIS — M1711 Unilateral primary osteoarthritis, right knee: Secondary | ICD-10-CM | POA: Diagnosis not present

## 2021-03-11 DIAGNOSIS — Z794 Long term (current) use of insulin: Secondary | ICD-10-CM | POA: Diagnosis not present

## 2021-03-11 DIAGNOSIS — E108 Type 1 diabetes mellitus with unspecified complications: Secondary | ICD-10-CM | POA: Diagnosis not present

## 2021-03-16 ENCOUNTER — Ambulatory Visit (HOSPITAL_COMMUNITY): Payer: Medicare PPO

## 2021-03-16 DIAGNOSIS — E113523 Type 2 diabetes mellitus with proliferative diabetic retinopathy with traction retinal detachment involving the macula, bilateral: Secondary | ICD-10-CM | POA: Diagnosis not present

## 2021-03-16 DIAGNOSIS — H541225 Low vision right eye category 2, blindness left eye category 5: Secondary | ICD-10-CM | POA: Diagnosis not present

## 2021-03-16 DIAGNOSIS — D849 Immunodeficiency, unspecified: Secondary | ICD-10-CM | POA: Diagnosis not present

## 2021-03-16 DIAGNOSIS — H3589 Other specified retinal disorders: Secondary | ICD-10-CM | POA: Diagnosis not present

## 2021-03-16 DIAGNOSIS — Z961 Presence of intraocular lens: Secondary | ICD-10-CM | POA: Diagnosis not present

## 2021-03-16 DIAGNOSIS — E785 Hyperlipidemia, unspecified: Secondary | ICD-10-CM | POA: Diagnosis not present

## 2021-03-16 DIAGNOSIS — Z940 Kidney transplant status: Secondary | ICD-10-CM | POA: Diagnosis not present

## 2021-03-16 DIAGNOSIS — H04123 Dry eye syndrome of bilateral lacrimal glands: Secondary | ICD-10-CM | POA: Diagnosis not present

## 2021-03-16 DIAGNOSIS — E113593 Type 2 diabetes mellitus with proliferative diabetic retinopathy without macular edema, bilateral: Secondary | ICD-10-CM | POA: Diagnosis not present

## 2021-03-16 DIAGNOSIS — D84821 Immunodeficiency due to drugs: Secondary | ICD-10-CM | POA: Diagnosis not present

## 2021-03-16 DIAGNOSIS — I10 Essential (primary) hypertension: Secondary | ICD-10-CM | POA: Diagnosis not present

## 2021-03-17 ENCOUNTER — Ambulatory Visit (HOSPITAL_COMMUNITY)
Admission: RE | Admit: 2021-03-17 | Discharge: 2021-03-17 | Disposition: A | Discharge status: Home / Self Care | Payer: Medicare PPO | Source: Ambulatory Visit | Attending: Internal Medicine | Admitting: Internal Medicine

## 2021-03-17 DIAGNOSIS — I898 Other specified noninfective disorders of lymphatic vessels and lymph nodes: Secondary | ICD-10-CM

## 2021-03-17 MED ORDER — SODIUM CHLORIDE 0.9 % IV SOLN: 5.0000 mg/kg | INTRAVENOUS | Status: DC

## 2021-03-17 NOTE — Progress Notes (Signed)
Pharmacist called at 1135 to report that pt needed to reschedule due to not having enough IV Belatacept. Patient verbalized understanding and rescheduled for Monday 03/21/21 at 1100.

## 2021-03-18 ENCOUNTER — Ambulatory Visit: Payer: Medicare PPO | Admitting: Podiatry

## 2021-03-18 DIAGNOSIS — M1711 Unilateral primary osteoarthritis, right knee: Secondary | ICD-10-CM | POA: Diagnosis not present

## 2021-03-21 ENCOUNTER — Ambulatory Visit (HOSPITAL_COMMUNITY): Payer: Medicare PPO

## 2021-03-21 ENCOUNTER — Other Ambulatory Visit (HOSPITAL_COMMUNITY): Payer: Self-pay | Admitting: *Deleted

## 2021-03-21 DIAGNOSIS — Z940 Kidney transplant status: Secondary | ICD-10-CM

## 2021-03-22 ENCOUNTER — Encounter (HOSPITAL_COMMUNITY)
Admission: RE | Admit: 2021-03-22 | Discharge: 2021-03-22 | Disposition: A | Discharge status: Home / Self Care | Payer: Medicare PPO | Source: Ambulatory Visit | Attending: Internal Medicine | Admitting: Internal Medicine

## 2021-03-22 ENCOUNTER — Other Ambulatory Visit: Payer: Self-pay

## 2021-03-22 DIAGNOSIS — Z940 Kidney transplant status: Secondary | ICD-10-CM | POA: Diagnosis not present

## 2021-03-22 LAB — PROTEIN / CREATININE RATIO, URINE
Creatinine, Urine: 64.18 mg/dL
Protein Creatinine Ratio: 0.14 mg/mg{Cre} (ref 0.00–0.15)
Total Protein, Urine: 9 mg/dL

## 2021-03-22 LAB — CBC WITH DIFFERENTIAL/PLATELET
Abs Immature Granulocytes: 0.11 10*3/uL — ABNORMAL HIGH (ref 0.00–0.07)
Basophils Absolute: 0 10*3/uL (ref 0.0–0.1)
Basophils Relative: 1 %
Eosinophils Absolute: 0.1 10*3/uL (ref 0.0–0.5)
Eosinophils Relative: 2 %
HCT: 35.7 % — ABNORMAL LOW (ref 36.0–46.0)
Hemoglobin: 10.8 g/dL — ABNORMAL LOW (ref 12.0–15.0)
Immature Granulocytes: 4 %
Lymphocytes Relative: 35 %
Lymphs Abs: 1.1 10*3/uL (ref 0.7–4.0)
MCH: 25.7 pg — ABNORMAL LOW (ref 26.0–34.0)
MCHC: 30.3 g/dL (ref 30.0–36.0)
MCV: 84.8 fL (ref 80.0–100.0)
Monocytes Absolute: 0.5 10*3/uL (ref 0.1–1.0)
Monocytes Relative: 17 %
Neutro Abs: 1.3 10*3/uL — ABNORMAL LOW (ref 1.7–7.7)
Neutrophils Relative %: 41 %
Platelets: 225 10*3/uL (ref 150–400)
RBC: 4.21 MIL/uL (ref 3.87–5.11)
RDW: 18.6 % — ABNORMAL HIGH (ref 11.5–15.5)
WBC: 3.1 10*3/uL — ABNORMAL LOW (ref 4.0–10.5)
nRBC: 0 % (ref 0.0–0.2)

## 2021-03-22 LAB — BASIC METABOLIC PANEL
Anion gap: 10 (ref 5–15)
BUN: 21 mg/dL (ref 8–23)
CO2: 21 mmol/L — ABNORMAL LOW (ref 22–32)
Calcium: 9.8 mg/dL (ref 8.9–10.3)
Chloride: 108 mmol/L (ref 98–111)
Creatinine, Ser: 0.92 mg/dL (ref 0.44–1.00)
GFR, Estimated: >60 mL/min (ref >60)
Glucose, Bld: 211 mg/dL — ABNORMAL HIGH (ref 70–99)
Potassium: 4 mmol/L (ref 3.5–5.1)
Sodium: 139 mmol/L (ref 135–145)

## 2021-03-22 LAB — URINALYSIS, COMPLETE (UACMP) WITH MICROSCOPIC
Bilirubin Urine: NEGATIVE
Glucose, UA: 150 mg/dL — AB
Hgb urine dipstick: NEGATIVE
Ketones, ur: NEGATIVE mg/dL
Leukocytes,Ua: NEGATIVE
Nitrite: NEGATIVE
Protein, ur: NEGATIVE mg/dL
Specific Gravity, Urine: 1.014 (ref 1.005–1.030)
pH: 5 (ref 5.0–8.0)

## 2021-03-22 MED ORDER — BELATACEPT 250 MG IV SOLR
375.0000 mg | INTRAVENOUS | Status: DC
  Administered 2021-03-22: 375 mg via INTRAVENOUS
  Filled 2021-03-22: qty 375

## 2021-03-23 LAB — BK QUANT PCR (PLASMA/SERUM)
BK Quantitaion PCR: 61 IU/mL
Log10 BK Qn PCR: 1.785 log10 IU/mL

## 2021-03-25 LAB — SIROLIMUS LEVEL: Sirolimus (Rapamycin): 3.6 ng/mL (ref 3.0–20.0)

## 2021-04-06 ENCOUNTER — Encounter: Payer: Self-pay | Admitting: Podiatry

## 2021-04-06 ENCOUNTER — Ambulatory Visit (INDEPENDENT_AMBULATORY_CARE_PROVIDER_SITE_OTHER): Payer: Medicare PPO | Admitting: Podiatry

## 2021-04-06 ENCOUNTER — Other Ambulatory Visit: Payer: Self-pay

## 2021-04-06 DIAGNOSIS — B351 Tinea unguium: Secondary | ICD-10-CM | POA: Diagnosis not present

## 2021-04-06 DIAGNOSIS — E1142 Type 2 diabetes mellitus with diabetic polyneuropathy: Secondary | ICD-10-CM | POA: Diagnosis not present

## 2021-04-06 DIAGNOSIS — M79674 Pain in right toe(s): Secondary | ICD-10-CM

## 2021-04-06 DIAGNOSIS — M79675 Pain in left toe(s): Secondary | ICD-10-CM

## 2021-04-06 DIAGNOSIS — Z940 Kidney transplant status: Secondary | ICD-10-CM

## 2021-04-06 NOTE — Progress Notes (Signed)
This patient returns to my office for at risk foot care.  This patient requires this care by a professional since this patient will be at risk due to having diabetic neuropathy.  Patient doing well since her kidney transplant.  This patient is unable to cut her  nails  since the patient cannot reach her nails.These nails are painful walking and wearing shoes.  This patient presents for at risk foot care today.  General Appearance  Alert, conversant and in no acute stress.  Vascular  Dorsalis pedis and posterior tibial  pulses are palpable  bilaterally.  Capillary return is within normal limits  bilaterally. Temperature is within normal limits  bilaterally.  Neurologic  Senn-Weinstein monofilament wire test diminished   bilaterally. Muscle power within normal limits bilaterally.  Nails Thick disfigured discolored nails with subungual debris  from hallux to fifth toes bilaterally. No evidence of bacterial infection or drainage bilaterally.  Orthopedic  No limitations of motion  feet .  No crepitus or effusions noted.  No bony pathology or digital deformities noted.  Skin  normotropic skin with no porokeratosis noted bilaterally.  No signs of infections or ulcers noted.     Onychomycosis  Pain in right toes  Pain in left toes  Consent was obtained for treatment procedures.   Mechanical debridement of nails 1-5  bilaterally performed with a nail nipper.  Filed with dremel without incident.    Return office visit  4 months                      Told patient to return for periodic foot care and evaluation due to potential at risk complications.   Gardiner Barefoot DPM

## 2021-04-07 DIAGNOSIS — L309 Dermatitis, unspecified: Secondary | ICD-10-CM | POA: Diagnosis not present

## 2021-04-07 DIAGNOSIS — D849 Immunodeficiency, unspecified: Secondary | ICD-10-CM | POA: Diagnosis not present

## 2021-04-07 DIAGNOSIS — L853 Xerosis cutis: Secondary | ICD-10-CM | POA: Diagnosis not present

## 2021-04-07 DIAGNOSIS — L815 Leukoderma, not elsewhere classified: Secondary | ICD-10-CM | POA: Diagnosis not present

## 2021-04-07 DIAGNOSIS — L821 Other seborrheic keratosis: Secondary | ICD-10-CM | POA: Diagnosis not present

## 2021-04-11 DIAGNOSIS — Z794 Long term (current) use of insulin: Secondary | ICD-10-CM | POA: Diagnosis not present

## 2021-04-11 DIAGNOSIS — E108 Type 1 diabetes mellitus with unspecified complications: Secondary | ICD-10-CM | POA: Diagnosis not present

## 2021-04-14 ENCOUNTER — Ambulatory Visit (HOSPITAL_COMMUNITY): Payer: Medicare PPO

## 2021-04-14 ENCOUNTER — Encounter: Payer: Self-pay | Admitting: Internal Medicine

## 2021-04-19 ENCOUNTER — Ambulatory Visit (HOSPITAL_COMMUNITY)
Admission: RE | Admit: 2021-04-19 | Discharge: 2021-04-19 | Disposition: A | Discharge status: Home / Self Care | Payer: Medicare PPO | Source: Ambulatory Visit | Attending: Internal Medicine | Admitting: Internal Medicine

## 2021-04-19 DIAGNOSIS — E11311 Type 2 diabetes mellitus with unspecified diabetic retinopathy with macular edema: Secondary | ICD-10-CM | POA: Diagnosis not present

## 2021-04-19 DIAGNOSIS — Z940 Kidney transplant status: Secondary | ICD-10-CM | POA: Insufficient documentation

## 2021-04-19 DIAGNOSIS — E113513 Type 2 diabetes mellitus with proliferative diabetic retinopathy with macular edema, bilateral: Secondary | ICD-10-CM | POA: Diagnosis not present

## 2021-04-19 DIAGNOSIS — E663 Overweight: Secondary | ICD-10-CM | POA: Diagnosis not present

## 2021-04-19 DIAGNOSIS — E039 Hypothyroidism, unspecified: Secondary | ICD-10-CM | POA: Diagnosis not present

## 2021-04-19 DIAGNOSIS — I10 Essential (primary) hypertension: Secondary | ICD-10-CM | POA: Diagnosis not present

## 2021-04-19 DIAGNOSIS — Z79899 Other long term (current) drug therapy: Secondary | ICD-10-CM | POA: Diagnosis not present

## 2021-04-19 DIAGNOSIS — M159 Polyosteoarthritis, unspecified: Secondary | ICD-10-CM | POA: Diagnosis not present

## 2021-04-19 DIAGNOSIS — Z0001 Encounter for general adult medical examination with abnormal findings: Secondary | ICD-10-CM | POA: Diagnosis not present

## 2021-04-19 DIAGNOSIS — Z794 Long term (current) use of insulin: Secondary | ICD-10-CM | POA: Diagnosis not present

## 2021-04-19 LAB — URINALYSIS, COMPLETE (UACMP) WITH MICROSCOPIC
Bacteria, UA: NONE SEEN
Bilirubin Urine: NEGATIVE
Glucose, UA: NEGATIVE mg/dL
Hgb urine dipstick: NEGATIVE
Ketones, ur: NEGATIVE mg/dL
Nitrite: NEGATIVE
Protein, ur: NEGATIVE mg/dL
Specific Gravity, Urine: 1.018 (ref 1.005–1.030)
pH: 5 (ref 5.0–8.0)

## 2021-04-19 LAB — CBC WITH DIFFERENTIAL/PLATELET
Abs Immature Granulocytes: 0 10*3/uL (ref 0.00–0.07)
Basophils Absolute: 0 10*3/uL (ref 0.0–0.1)
Basophils Relative: 1 %
Eosinophils Absolute: 0.1 10*3/uL (ref 0.0–0.5)
Eosinophils Relative: 3 %
HCT: 40.4 % (ref 36.0–46.0)
Hemoglobin: 12.2 g/dL (ref 12.0–15.0)
Lymphocytes Relative: 31 %
Lymphs Abs: 1 10*3/uL (ref 0.7–4.0)
MCH: 25.7 pg — ABNORMAL LOW (ref 26.0–34.0)
MCHC: 30.2 g/dL (ref 30.0–36.0)
MCV: 85.1 fL (ref 80.0–100.0)
Monocytes Absolute: 0.3 10*3/uL (ref 0.1–1.0)
Monocytes Relative: 10 %
Neutro Abs: 1.8 10*3/uL (ref 1.7–7.7)
Neutrophils Relative %: 55 %
Platelets: 191 10*3/uL (ref 150–400)
RBC: 4.75 MIL/uL (ref 3.87–5.11)
RDW: 18.7 % — ABNORMAL HIGH (ref 11.5–15.5)
WBC: 3.3 10*3/uL — ABNORMAL LOW (ref 4.0–10.5)
nRBC: 0 % (ref 0.0–0.2)
nRBC: 0 /100 WBC

## 2021-04-19 LAB — BASIC METABOLIC PANEL
Anion gap: 4 — ABNORMAL LOW (ref 5–15)
BUN: 27 mg/dL — ABNORMAL HIGH (ref 8–23)
CO2: 21 mmol/L — ABNORMAL LOW (ref 22–32)
Calcium: 9.7 mg/dL (ref 8.9–10.3)
Chloride: 114 mmol/L — ABNORMAL HIGH (ref 98–111)
Creatinine, Ser: 1.09 mg/dL — ABNORMAL HIGH (ref 0.44–1.00)
GFR, Estimated: 55 mL/min — ABNORMAL LOW (ref >60)
Glucose, Bld: 193 mg/dL — ABNORMAL HIGH (ref 70–99)
Potassium: 4.3 mmol/L (ref 3.5–5.1)
Sodium: 139 mmol/L (ref 135–145)

## 2021-04-19 LAB — PROTEIN / CREATININE RATIO, URINE
Creatinine, Urine: 129.07 mg/dL
Protein Creatinine Ratio: 0.11 mg/mg{Cre} (ref 0.00–0.15)
Total Protein, Urine: 14 mg/dL

## 2021-04-19 MED ORDER — SODIUM CHLORIDE 0.9 % IV SOLN
375.0000 mg | INTRAVENOUS | Status: DC
  Administered 2021-04-19: 11:00:00 375 mg via INTRAVENOUS
  Filled 2021-04-19: qty 375

## 2021-04-21 LAB — BK QUANT PCR (PLASMA/SERUM)
BK Quantitaion PCR: 116 IU/mL
Log10 BK Qn PCR: 2.064 log10 IU/mL

## 2021-04-21 LAB — SIROLIMUS LEVEL: Sirolimus (Rapamycin): 4.4 ng/mL (ref 3.0–20.0)

## 2021-05-06 ENCOUNTER — Other Ambulatory Visit: Payer: Self-pay | Admitting: Internal Medicine

## 2021-05-06 DIAGNOSIS — Z1231 Encounter for screening mammogram for malignant neoplasm of breast: Secondary | ICD-10-CM

## 2021-05-16 ENCOUNTER — Other Ambulatory Visit (HOSPITAL_COMMUNITY): Payer: Self-pay | Admitting: *Deleted

## 2021-05-17 ENCOUNTER — Ambulatory Visit (HOSPITAL_COMMUNITY)
Admission: RE | Admit: 2021-05-17 | Discharge: 2021-05-17 | Disposition: A | Discharge status: Home / Self Care | Payer: Medicare PPO | Source: Ambulatory Visit | Attending: Internal Medicine | Admitting: Internal Medicine

## 2021-05-17 DIAGNOSIS — Z940 Kidney transplant status: Secondary | ICD-10-CM | POA: Diagnosis not present

## 2021-05-17 DIAGNOSIS — E039 Hypothyroidism, unspecified: Secondary | ICD-10-CM | POA: Insufficient documentation

## 2021-05-17 DIAGNOSIS — Z5181 Encounter for therapeutic drug level monitoring: Secondary | ICD-10-CM | POA: Insufficient documentation

## 2021-05-17 LAB — CBC WITH DIFFERENTIAL/PLATELET
Abs Immature Granulocytes: 0.15 10*3/uL — ABNORMAL HIGH (ref 0.00–0.07)
Basophils Absolute: 0 10*3/uL (ref 0.0–0.1)
Basophils Relative: 1 %
Eosinophils Absolute: 0.1 10*3/uL (ref 0.0–0.5)
Eosinophils Relative: 4 %
HCT: 37.7 % (ref 36.0–46.0)
Hemoglobin: 11.5 g/dL — ABNORMAL LOW (ref 12.0–15.0)
Immature Granulocytes: 4 %
Lymphocytes Relative: 35 %
Lymphs Abs: 1.3 10*3/uL (ref 0.7–4.0)
MCH: 25.6 pg — ABNORMAL LOW (ref 26.0–34.0)
MCHC: 30.5 g/dL (ref 30.0–36.0)
MCV: 84 fL (ref 80.0–100.0)
Monocytes Absolute: 0.5 10*3/uL (ref 0.1–1.0)
Monocytes Relative: 13 %
Neutro Abs: 1.6 10*3/uL — ABNORMAL LOW (ref 1.7–7.7)
Neutrophils Relative %: 43 %
Platelets: 197 10*3/uL (ref 150–400)
RBC: 4.49 MIL/uL (ref 3.87–5.11)
RDW: 18.6 % — ABNORMAL HIGH (ref 11.5–15.5)
WBC: 3.7 10*3/uL — ABNORMAL LOW (ref 4.0–10.5)
nRBC: 0 % (ref 0.0–0.2)

## 2021-05-17 LAB — URINALYSIS, COMPLETE (UACMP) WITH MICROSCOPIC
Bilirubin Urine: NEGATIVE
Glucose, UA: NEGATIVE mg/dL
Ketones, ur: NEGATIVE mg/dL
Nitrite: NEGATIVE
Protein, ur: NEGATIVE mg/dL
Specific Gravity, Urine: 1.025 (ref 1.005–1.030)
WBC, UA: >50 WBC/hpf (ref 0–5)
pH: 5.5 (ref 5.0–8.0)

## 2021-05-17 LAB — PROTEIN / CREATININE RATIO, URINE
Creatinine, Urine: 100 mg/dL
Protein Creatinine Ratio: 0.18 mg/mg{Cre} — ABNORMAL HIGH (ref 0.00–0.15)
Total Protein, Urine: 18 mg/dL

## 2021-05-17 LAB — BASIC METABOLIC PANEL
Anion gap: 5 (ref 5–15)
BUN: 24 mg/dL — ABNORMAL HIGH (ref 8–23)
CO2: 24 mmol/L (ref 22–32)
Calcium: 9.3 mg/dL (ref 8.9–10.3)
Chloride: 111 mmol/L (ref 98–111)
Creatinine, Ser: 1.05 mg/dL — ABNORMAL HIGH (ref 0.44–1.00)
GFR, Estimated: 58 mL/min — ABNORMAL LOW (ref >60)
Glucose, Bld: 133 mg/dL — ABNORMAL HIGH (ref 70–99)
Potassium: 4.3 mmol/L (ref 3.5–5.1)
Sodium: 140 mmol/L (ref 135–145)

## 2021-05-17 LAB — TSH: TSH: 1.681 u[IU]/mL (ref 0.350–4.500)

## 2021-05-17 MED ORDER — SODIUM CHLORIDE 0.9 % IV SOLN
375.0000 mg | INTRAVENOUS | Status: DC
  Administered 2021-05-17: 375 mg via INTRAVENOUS
  Filled 2021-05-17: qty 375

## 2021-05-18 LAB — T3, FREE: T3, Free: 1.8 pg/mL — ABNORMAL LOW (ref 2.0–4.4)

## 2021-05-18 LAB — T4: T4, Total: 7.9 ug/dL (ref 4.5–12.0)

## 2021-05-18 LAB — BK QUANT PCR (PLASMA/SERUM)
BK Quantitaion PCR: 136 IU/mL
Log10 BK Qn PCR: 2.134 log10 IU/mL

## 2021-05-18 LAB — T3 UPTAKE: T3 Uptake Ratio: 24 % (ref 24–39)

## 2021-05-20 LAB — SIROLIMUS LEVEL: Sirolimus (Rapamycin): 3.3 ng/mL (ref 3.0–20.0)

## 2021-06-13 ENCOUNTER — Other Ambulatory Visit: Payer: Self-pay | Admitting: Internal Medicine

## 2021-06-13 ENCOUNTER — Ambulatory Visit: Payer: Medicare PPO

## 2021-06-13 DIAGNOSIS — E039 Hypothyroidism, unspecified: Secondary | ICD-10-CM

## 2021-06-14 ENCOUNTER — Ambulatory Visit (HOSPITAL_COMMUNITY)
Admission: RE | Admit: 2021-06-14 | Discharge: 2021-06-14 | Disposition: A | Discharge status: Home / Self Care | Payer: Medicare PPO | Source: Ambulatory Visit | Attending: Internal Medicine | Admitting: Internal Medicine

## 2021-06-14 ENCOUNTER — Other Ambulatory Visit: Payer: Self-pay

## 2021-06-14 DIAGNOSIS — Z940 Kidney transplant status: Secondary | ICD-10-CM | POA: Diagnosis present

## 2021-06-14 LAB — URINALYSIS, COMPLETE (UACMP) WITH MICROSCOPIC
Bilirubin Urine: NEGATIVE
Glucose, UA: NEGATIVE mg/dL
Hgb urine dipstick: NEGATIVE
Ketones, ur: NEGATIVE mg/dL
Nitrite: NEGATIVE
Protein, ur: NEGATIVE mg/dL
Specific Gravity, Urine: 1.018 (ref 1.005–1.030)
pH: 5 (ref 5.0–8.0)

## 2021-06-14 LAB — PROTEIN / CREATININE RATIO, URINE
Creatinine, Urine: 102.22 mg/dL
Protein Creatinine Ratio: 0.2 mg/mg{Cre} — ABNORMAL HIGH (ref 0.00–0.15)
Total Protein, Urine: 20 mg/dL

## 2021-06-14 LAB — CBC WITH DIFFERENTIAL/PLATELET
Abs Immature Granulocytes: 0.21 10*3/uL — ABNORMAL HIGH (ref 0.00–0.07)
Basophils Absolute: 0 10*3/uL (ref 0.0–0.1)
Basophils Relative: 1 %
Eosinophils Absolute: 0.1 10*3/uL (ref 0.0–0.5)
Eosinophils Relative: 2 %
HCT: 37.2 % (ref 36.0–46.0)
Hemoglobin: 11.6 g/dL — ABNORMAL LOW (ref 12.0–15.0)
Immature Granulocytes: 7 %
Lymphocytes Relative: 35 %
Lymphs Abs: 1.1 10*3/uL (ref 0.7–4.0)
MCH: 26.1 pg (ref 26.0–34.0)
MCHC: 31.2 g/dL (ref 30.0–36.0)
MCV: 83.6 fL (ref 80.0–100.0)
Monocytes Absolute: 0.5 10*3/uL (ref 0.1–1.0)
Monocytes Relative: 15 %
Neutro Abs: 1.3 10*3/uL — ABNORMAL LOW (ref 1.7–7.7)
Neutrophils Relative %: 40 %
Platelets: 208 10*3/uL (ref 150–400)
RBC: 4.45 MIL/uL (ref 3.87–5.11)
RDW: 18 % — ABNORMAL HIGH (ref 11.5–15.5)
WBC: 3.2 10*3/uL — ABNORMAL LOW (ref 4.0–10.5)
nRBC: 0 % (ref 0.0–0.2)

## 2021-06-14 LAB — BASIC METABOLIC PANEL
Anion gap: 8 (ref 5–15)
BUN: 20 mg/dL (ref 8–23)
CO2: 23 mmol/L (ref 22–32)
Calcium: 9.7 mg/dL (ref 8.9–10.3)
Chloride: 111 mmol/L (ref 98–111)
Creatinine, Ser: 0.92 mg/dL (ref 0.44–1.00)
GFR, Estimated: >60 mL/min (ref >60)
Glucose, Bld: 141 mg/dL — ABNORMAL HIGH (ref 70–99)
Potassium: 4.4 mmol/L (ref 3.5–5.1)
Sodium: 142 mmol/L (ref 135–145)

## 2021-06-14 MED ORDER — SODIUM CHLORIDE 0.9 % IV SOLN
375.0000 mg | INTRAVENOUS | Status: DC
  Administered 2021-06-14: 375 mg via INTRAVENOUS
  Filled 2021-06-14: qty 375

## 2021-06-15 ENCOUNTER — Ambulatory Visit
Admission: RE | Admit: 2021-06-15 | Discharge: 2021-06-15 | Disposition: A | Discharge status: Home / Self Care | Payer: Medicare PPO | Source: Ambulatory Visit | Attending: Internal Medicine | Admitting: Internal Medicine

## 2021-06-15 DIAGNOSIS — Z1231 Encounter for screening mammogram for malignant neoplasm of breast: Secondary | ICD-10-CM

## 2021-06-15 LAB — BK QUANT PCR (PLASMA/SERUM)
BK Quantitaion PCR: 150 IU/mL
Log10 BK Qn PCR: 2.176 log10 IU/mL

## 2021-06-17 LAB — SIROLIMUS LEVEL: Sirolimus (Rapamycin): 4.4 ng/mL (ref 3.0–20.0)

## 2021-07-11 ENCOUNTER — Ambulatory Visit (HOSPITAL_COMMUNITY)
Admission: RE | Admit: 2021-07-11 | Discharge: 2021-07-11 | Disposition: A | Discharge status: Home / Self Care | Payer: Medicare PPO | Source: Ambulatory Visit | Attending: Internal Medicine | Admitting: Internal Medicine

## 2021-07-11 DIAGNOSIS — Z940 Kidney transplant status: Secondary | ICD-10-CM | POA: Diagnosis present

## 2021-07-11 LAB — CBC WITH DIFFERENTIAL/PLATELET
Abs Immature Granulocytes: 0.1 10*3/uL — ABNORMAL HIGH (ref 0.00–0.07)
Basophils Absolute: 0 10*3/uL (ref 0.0–0.1)
Basophils Relative: 1 %
Eosinophils Absolute: 0.1 10*3/uL (ref 0.0–0.5)
Eosinophils Relative: 2 %
HCT: 35.9 % — ABNORMAL LOW (ref 36.0–46.0)
Hemoglobin: 11 g/dL — ABNORMAL LOW (ref 12.0–15.0)
Immature Granulocytes: 3 %
Lymphocytes Relative: 35 %
Lymphs Abs: 1.2 10*3/uL (ref 0.7–4.0)
MCH: 25.9 pg — ABNORMAL LOW (ref 26.0–34.0)
MCHC: 30.6 g/dL (ref 30.0–36.0)
MCV: 84.5 fL (ref 80.0–100.0)
Monocytes Absolute: 0.6 10*3/uL (ref 0.1–1.0)
Monocytes Relative: 17 %
Neutro Abs: 1.4 10*3/uL — ABNORMAL LOW (ref 1.7–7.7)
Neutrophils Relative %: 42 %
Platelets: 226 10*3/uL (ref 150–400)
RBC: 4.25 MIL/uL (ref 3.87–5.11)
RDW: 18.4 % — ABNORMAL HIGH (ref 11.5–15.5)
WBC: 3.4 10*3/uL — ABNORMAL LOW (ref 4.0–10.5)
nRBC: 0 % (ref 0.0–0.2)

## 2021-07-11 LAB — URINALYSIS, COMPLETE (UACMP) WITH MICROSCOPIC
Bilirubin Urine: NEGATIVE
Glucose, UA: NEGATIVE mg/dL
Hgb urine dipstick: NEGATIVE
Ketones, ur: NEGATIVE mg/dL
Nitrite: NEGATIVE
Protein, ur: NEGATIVE mg/dL
Specific Gravity, Urine: 1.018 (ref 1.005–1.030)
pH: 5 (ref 5.0–8.0)

## 2021-07-11 LAB — BASIC METABOLIC PANEL
Anion gap: 6 (ref 5–15)
BUN: 22 mg/dL (ref 8–23)
CO2: 25 mmol/L (ref 22–32)
Calcium: 9.6 mg/dL (ref 8.9–10.3)
Chloride: 111 mmol/L (ref 98–111)
Creatinine, Ser: 1.01 mg/dL — ABNORMAL HIGH (ref 0.44–1.00)
GFR, Estimated: >60 mL/min (ref >60)
Glucose, Bld: 181 mg/dL — ABNORMAL HIGH (ref 70–99)
Potassium: 4.7 mmol/L (ref 3.5–5.1)
Sodium: 142 mmol/L (ref 135–145)

## 2021-07-11 LAB — PROTEIN / CREATININE RATIO, URINE
Creatinine, Urine: 113.37 mg/dL
Protein Creatinine Ratio: 0.14 mg/mg{Cre} (ref 0.00–0.15)
Total Protein, Urine: 16 mg/dL

## 2021-07-11 MED ORDER — SODIUM CHLORIDE 0.9 % IV SOLN
375.0000 mg | INTRAVENOUS | Status: DC
  Administered 2021-07-11: 375 mg via INTRAVENOUS
  Filled 2021-07-11: qty 375

## 2021-07-11 NOTE — Progress Notes (Addendum)
Spoke with Caren Griffins nurse at office about patient finishing amoxicillin on Wednesday 07/06/21 for sinus infection. Caren Griffins will check with physician to see if patient is okay to receive Nulogix. ?Spoke with Caren Griffins who stated Per Dr. Guerry Minors phillips patient was okay to get Belatacept/nulogix today. ?

## 2021-07-12 LAB — BK QUANT PCR (PLASMA/SERUM)
BK Quantitaion PCR: 28 IU/mL
Log10 BK Qn PCR: 1.447 log10 IU/mL

## 2021-07-13 LAB — SIROLIMUS LEVEL: Sirolimus (Rapamycin): 4.3 ng/mL (ref 3.0–20.0)

## 2021-08-08 ENCOUNTER — Encounter: Payer: Self-pay | Admitting: Podiatry

## 2021-08-08 ENCOUNTER — Ambulatory Visit (INDEPENDENT_AMBULATORY_CARE_PROVIDER_SITE_OTHER): Payer: Medicare PPO | Admitting: Podiatry

## 2021-08-08 ENCOUNTER — Ambulatory Visit (HOSPITAL_COMMUNITY)
Admission: RE | Admit: 2021-08-08 | Discharge: 2021-08-08 | Disposition: A | Discharge status: Home / Self Care | Payer: Medicare PPO | Source: Ambulatory Visit | Attending: Internal Medicine | Admitting: Internal Medicine

## 2021-08-08 DIAGNOSIS — Z940 Kidney transplant status: Secondary | ICD-10-CM | POA: Insufficient documentation

## 2021-08-08 DIAGNOSIS — B351 Tinea unguium: Secondary | ICD-10-CM | POA: Diagnosis not present

## 2021-08-08 DIAGNOSIS — M79674 Pain in right toe(s): Secondary | ICD-10-CM | POA: Diagnosis not present

## 2021-08-08 DIAGNOSIS — M79675 Pain in left toe(s): Secondary | ICD-10-CM

## 2021-08-08 DIAGNOSIS — E1142 Type 2 diabetes mellitus with diabetic polyneuropathy: Secondary | ICD-10-CM

## 2021-08-08 LAB — CBC WITH DIFFERENTIAL/PLATELET
Abs Immature Granulocytes: 0.24 10*3/uL — ABNORMAL HIGH (ref 0.00–0.07)
Basophils Absolute: 0 10*3/uL (ref 0.0–0.1)
Basophils Relative: 1 %
Eosinophils Absolute: 0.1 10*3/uL (ref 0.0–0.5)
Eosinophils Relative: 3 %
HCT: 36.1 % (ref 36.0–46.0)
Hemoglobin: 11 g/dL — ABNORMAL LOW (ref 12.0–15.0)
Immature Granulocytes: 7 %
Lymphocytes Relative: 34 %
Lymphs Abs: 1.2 10*3/uL (ref 0.7–4.0)
MCH: 25.4 pg — ABNORMAL LOW (ref 26.0–34.0)
MCHC: 30.5 g/dL (ref 30.0–36.0)
MCV: 83.4 fL (ref 80.0–100.0)
Monocytes Absolute: 0.6 10*3/uL (ref 0.1–1.0)
Monocytes Relative: 18 %
Neutro Abs: 1.3 10*3/uL — ABNORMAL LOW (ref 1.7–7.7)
Neutrophils Relative %: 37 %
Platelets: 205 10*3/uL (ref 150–400)
RBC: 4.33 MIL/uL (ref 3.87–5.11)
RDW: 18.4 % — ABNORMAL HIGH (ref 11.5–15.5)
WBC: 3.4 10*3/uL — ABNORMAL LOW (ref 4.0–10.5)
nRBC: 0 % (ref 0.0–0.2)

## 2021-08-08 LAB — URINALYSIS, COMPLETE (UACMP) WITH MICROSCOPIC
Bilirubin Urine: NEGATIVE
Glucose, UA: NEGATIVE mg/dL
Hgb urine dipstick: NEGATIVE
Ketones, ur: NEGATIVE mg/dL
Nitrite: NEGATIVE
Protein, ur: NEGATIVE mg/dL
Specific Gravity, Urine: 1.017 (ref 1.005–1.030)
pH: 5 (ref 5.0–8.0)

## 2021-08-08 LAB — BASIC METABOLIC PANEL
Anion gap: 5 (ref 5–15)
BUN: 29 mg/dL — ABNORMAL HIGH (ref 8–23)
CO2: 23 mmol/L (ref 22–32)
Calcium: 9.9 mg/dL (ref 8.9–10.3)
Chloride: 115 mmol/L — ABNORMAL HIGH (ref 98–111)
Creatinine, Ser: 0.96 mg/dL (ref 0.44–1.00)
GFR, Estimated: >60 mL/min (ref >60)
Glucose, Bld: 130 mg/dL — ABNORMAL HIGH (ref 70–99)
Potassium: 4.9 mmol/L (ref 3.5–5.1)
Sodium: 143 mmol/L (ref 135–145)

## 2021-08-08 LAB — PROTEIN / CREATININE RATIO, URINE
Creatinine, Urine: 95.15 mg/dL
Protein Creatinine Ratio: 0.11 mg/mg{Cre} (ref 0.00–0.15)
Total Protein, Urine: 10 mg/dL

## 2021-08-08 MED ORDER — SODIUM CHLORIDE 0.9 % IV SOLN
375.0000 mg | INTRAVENOUS | Status: DC
  Administered 2021-08-08: 375 mg via INTRAVENOUS
  Filled 2021-08-08: qty 375

## 2021-08-08 NOTE — Progress Notes (Signed)
This patient returns to my office for at risk foot care.  This patient requires this care by a professional since this patient will be at risk due to having diabetic neuropathy.  Patient doing well since her kidney transplant.  This patient is unable to cut her  nails  since the patient cannot reach her nails.These nails are painful walking and wearing shoes.  This patient presents for at risk foot care today. ? ?General Appearance  Alert, conversant and in no acute stress. ? ?Vascular  Dorsalis pedis and posterior tibial  pulses are palpable  bilaterally.  Capillary return is within normal limits  bilaterally. Temperature is within normal limits  bilaterally. ? ?Neurologic  Senn-Weinstein monofilament wire test diminished   bilaterally. Muscle power within normal limits bilaterally. ? ?Nails Thick disfigured discolored nails with subungual debris  from hallux to fifth toes bilaterally. No evidence of bacterial infection or drainage bilaterally. ? ?Orthopedic  No limitations of motion  feet .  No crepitus or effusions noted.  No bony pathology or digital deformities noted. ? ?Skin  normotropic skin with no porokeratosis noted bilaterally.  No signs of infections or ulcers noted.    ? ?Onychomycosis  Pain in right toes  Pain in left toes ? ?Consent was obtained for treatment procedures.   Mechanical debridement of nails 1-5  bilaterally performed with a nail nipper.  Filed with dremel without incident.  ? ? ?Return office visit  4 months                      Told patient to return for periodic foot care and evaluation due to potential at risk complications. ? ? ?Gardiner Barefoot DPM  ?

## 2021-08-10 LAB — BK QUANT PCR (PLASMA/SERUM)
BK Quantitaion PCR: 93 IU/mL
Log10 BK Qn PCR: 1.968 log10 IU/mL

## 2021-08-10 LAB — SIROLIMUS LEVEL: Sirolimus (Rapamycin): 3.9 ng/mL (ref 3.0–20.0)

## 2021-09-05 ENCOUNTER — Ambulatory Visit (HOSPITAL_COMMUNITY)
Admission: RE | Admit: 2021-09-05 | Discharge: 2021-09-05 | Disposition: A | Discharge status: Home / Self Care | Payer: Medicare PPO | Source: Ambulatory Visit | Attending: Internal Medicine | Admitting: Internal Medicine

## 2021-09-05 DIAGNOSIS — Z940 Kidney transplant status: Secondary | ICD-10-CM | POA: Diagnosis present

## 2021-09-05 LAB — URINALYSIS, COMPLETE (UACMP) WITH MICROSCOPIC
Bilirubin Urine: NEGATIVE
Glucose, UA: NEGATIVE mg/dL
Hgb urine dipstick: NEGATIVE
Ketones, ur: NEGATIVE mg/dL
Nitrite: NEGATIVE
Protein, ur: NEGATIVE mg/dL
Specific Gravity, Urine: 1.02 (ref 1.005–1.030)
pH: 5 (ref 5.0–8.0)

## 2021-09-05 LAB — BASIC METABOLIC PANEL
Anion gap: 6 (ref 5–15)
BUN: 25 mg/dL — ABNORMAL HIGH (ref 8–23)
CO2: 21 mmol/L — ABNORMAL LOW (ref 22–32)
Calcium: 9.7 mg/dL (ref 8.9–10.3)
Chloride: 113 mmol/L — ABNORMAL HIGH (ref 98–111)
Creatinine, Ser: 1.03 mg/dL — ABNORMAL HIGH (ref 0.44–1.00)
GFR, Estimated: 58 mL/min — ABNORMAL LOW (ref >60)
Glucose, Bld: 160 mg/dL — ABNORMAL HIGH (ref 70–99)
Potassium: 4.9 mmol/L (ref 3.5–5.1)
Sodium: 140 mmol/L (ref 135–145)

## 2021-09-05 LAB — CBC WITH DIFFERENTIAL/PLATELET
Abs Immature Granulocytes: 0.05 10*3/uL (ref 0.00–0.07)
Basophils Absolute: 0 10*3/uL (ref 0.0–0.1)
Basophils Relative: 1 %
Eosinophils Absolute: 0.1 10*3/uL (ref 0.0–0.5)
Eosinophils Relative: 3 %
HCT: 35.6 % — ABNORMAL LOW (ref 36.0–46.0)
Hemoglobin: 11.2 g/dL — ABNORMAL LOW (ref 12.0–15.0)
Immature Granulocytes: 2 %
Lymphocytes Relative: 39 %
Lymphs Abs: 1 10*3/uL (ref 0.7–4.0)
MCH: 26 pg (ref 26.0–34.0)
MCHC: 31.5 g/dL (ref 30.0–36.0)
MCV: 82.6 fL (ref 80.0–100.0)
Monocytes Absolute: 0.5 10*3/uL (ref 0.1–1.0)
Monocytes Relative: 17 %
Neutro Abs: 1 10*3/uL — ABNORMAL LOW (ref 1.7–7.7)
Neutrophils Relative %: 38 %
Platelets: 81 10*3/uL — ABNORMAL LOW (ref 150–400)
RBC: 4.31 MIL/uL (ref 3.87–5.11)
RDW: 19.1 % — ABNORMAL HIGH (ref 11.5–15.5)
WBC: 2.6 10*3/uL — ABNORMAL LOW (ref 4.0–10.5)
nRBC: 0 % (ref 0.0–0.2)

## 2021-09-05 MED ORDER — SODIUM CHLORIDE 0.9 % IV SOLN
375.0000 mg | INTRAVENOUS | Status: DC
  Administered 2021-09-05: 375 mg via INTRAVENOUS
  Filled 2021-09-05: qty 375

## 2021-09-07 LAB — SIROLIMUS LEVEL: Sirolimus (Rapamycin): 5.4 ng/mL (ref 3.0–20.0)

## 2021-10-03 ENCOUNTER — Ambulatory Visit (HOSPITAL_COMMUNITY)
Admission: RE | Admit: 2021-10-03 | Discharge: 2021-10-03 | Disposition: A | Discharge status: Home / Self Care | Payer: Medicare PPO | Source: Ambulatory Visit | Attending: Internal Medicine | Admitting: Internal Medicine

## 2021-10-03 VITALS — BP 152/67 | HR 60 | Temp 97.6°F | Resp 18 | Wt 165.0 lb

## 2021-10-03 DIAGNOSIS — Z940 Kidney transplant status: Secondary | ICD-10-CM | POA: Diagnosis present

## 2021-10-03 LAB — PROTEIN / CREATININE RATIO, URINE
Creatinine, Urine: 114 mg/dL
Protein Creatinine Ratio: 0.26 mg/mg{Cre} — ABNORMAL HIGH (ref 0.00–0.15)
Total Protein, Urine: 30 mg/dL

## 2021-10-03 MED ORDER — SODIUM CHLORIDE 0.9 % IV SOLN
375.0000 mg | INTRAVENOUS | Status: DC
  Administered 2021-10-03: 375 mg via INTRAVENOUS
  Filled 2021-10-03: qty 375

## 2021-10-06 ENCOUNTER — Ambulatory Visit: Payer: Medicare PPO | Admitting: Adult Health

## 2021-10-31 ENCOUNTER — Ambulatory Visit (HOSPITAL_COMMUNITY)
Admission: RE | Admit: 2021-10-31 | Discharge: 2021-10-31 | Disposition: A | Discharge status: Home / Self Care | Payer: Medicare PPO | Source: Ambulatory Visit | Attending: Internal Medicine | Admitting: Internal Medicine

## 2021-10-31 DIAGNOSIS — Z940 Kidney transplant status: Secondary | ICD-10-CM | POA: Insufficient documentation

## 2021-10-31 LAB — URINALYSIS, COMPLETE (UACMP) WITH MICROSCOPIC
Bacteria, UA: NONE SEEN
Bilirubin Urine: NEGATIVE
Glucose, UA: NEGATIVE mg/dL
Hgb urine dipstick: NEGATIVE
Ketones, ur: NEGATIVE mg/dL
Nitrite: NEGATIVE
Protein, ur: NEGATIVE mg/dL
Specific Gravity, Urine: 1.017 (ref 1.005–1.030)
pH: 5 (ref 5.0–8.0)

## 2021-10-31 LAB — CBC WITH DIFFERENTIAL/PLATELET
Abs Immature Granulocytes: 0.15 10*3/uL — ABNORMAL HIGH (ref 0.00–0.07)
Basophils Absolute: 0 10*3/uL (ref 0.0–0.1)
Basophils Relative: 1 %
Eosinophils Absolute: 0.1 10*3/uL (ref 0.0–0.5)
Eosinophils Relative: 3 %
HCT: 36.3 % (ref 36.0–46.0)
Hemoglobin: 11.3 g/dL — ABNORMAL LOW (ref 12.0–15.0)
Immature Granulocytes: 5 %
Lymphocytes Relative: 35 %
Lymphs Abs: 1 10*3/uL (ref 0.7–4.0)
MCH: 25.9 pg — ABNORMAL LOW (ref 26.0–34.0)
MCHC: 31.1 g/dL (ref 30.0–36.0)
MCV: 83.3 fL (ref 80.0–100.0)
Monocytes Absolute: 0.5 10*3/uL (ref 0.1–1.0)
Monocytes Relative: 17 %
Neutro Abs: 1.1 10*3/uL — ABNORMAL LOW (ref 1.7–7.7)
Neutrophils Relative %: 39 %
Platelets: 239 10*3/uL (ref 150–400)
RBC: 4.36 MIL/uL (ref 3.87–5.11)
RDW: 18.9 % — ABNORMAL HIGH (ref 11.5–15.5)
WBC: 2.9 10*3/uL — ABNORMAL LOW (ref 4.0–10.5)
nRBC: 0 % (ref 0.0–0.2)

## 2021-10-31 LAB — BASIC METABOLIC PANEL
Anion gap: 6 (ref 5–15)
BUN: 21 mg/dL (ref 8–23)
CO2: 22 mmol/L (ref 22–32)
Calcium: 9.4 mg/dL (ref 8.9–10.3)
Chloride: 112 mmol/L — ABNORMAL HIGH (ref 98–111)
Creatinine, Ser: 1.04 mg/dL — ABNORMAL HIGH (ref 0.44–1.00)
GFR, Estimated: 58 mL/min — ABNORMAL LOW (ref >60)
Glucose, Bld: 178 mg/dL — ABNORMAL HIGH (ref 70–99)
Potassium: 5.5 mmol/L — ABNORMAL HIGH (ref 3.5–5.1)
Sodium: 140 mmol/L (ref 135–145)

## 2021-10-31 LAB — PROTEIN / CREATININE RATIO, URINE
Creatinine, Urine: 99.14 mg/dL
Protein Creatinine Ratio: 0.15 mg/mg{Cre} (ref 0.00–0.15)
Total Protein, Urine: 15 mg/dL

## 2021-10-31 MED ORDER — SODIUM CHLORIDE 0.9 % IV SOLN
375.0000 mg | INTRAVENOUS | Status: DC
  Administered 2021-10-31: 375 mg via INTRAVENOUS
  Filled 2021-10-31: qty 375

## 2021-11-02 LAB — SIROLIMUS LEVEL: Sirolimus (Rapamycin): 5 ng/mL (ref 3.0–20.0)

## 2021-11-25 ENCOUNTER — Other Ambulatory Visit: Payer: Self-pay | Admitting: Orthopaedic Surgery

## 2021-11-25 NOTE — Progress Notes (Signed)
Pt. Need orders for surgery. 

## 2021-11-25 NOTE — Patient Instructions (Addendum)
DUE TO COVID-19 ONLY TWO VISITORS  (aged 70 and older)  ARE ALLOWED TO COME WITH YOU AND STAY IN THE WAITING ROOM ONLY DURING PRE OP AND PROCEDURE.   **NO VISITORS ARE ALLOWED IN THE SHORT STAY AREA OR RECOVERY ROOM!!**  IF YOU WILL BE ADMITTED INTO THE HOSPITAL YOU ARE ALLOWED ONLY FOUR SUPPORT PEOPLE DURING VISITATION HOURS ONLY (7 AM -8PM)   The support person(s) must pass our screening, gel in and out, and wear a mask at all times, including in the patient's room. Patients must also wear a mask when staff or their support person are in the room. Visitors GUEST BADGE MUST BE WORN VISIBLY  One adult visitor may remain with you overnight and MUST be in the room by 8 P.M.     Your procedure is scheduled on: 12/06/21   Report to [facility] Main Entrance    Report to admitting at : 7:15 AM   Call this number if you have problems the morning of surgery [phone]   Do not eat food :After Midnight.   After Midnight you may have the following liquids until : 6:50 AM DAY OF SURGERY  Water Black Coffee (sugar ok, NO MILK/CREAM OR CREAMERS)  Tea (sugar ok, NO MILK/CREAM OR CREAMERS) regular and decaf                             Plain Jell-O (NO RED)                                           Fruit ices (not with fruit pulp, NO RED)                                     Popsicles (NO RED)                                                                  Juice: apple, WHITE grape, WHITE cranberry Sports drinks like Gatorade (NO RED)              Keep the liquids restrictions as per you as usual for your ESRD.    Oral Hygiene is also important to reduce your risk of infection.                                    Remember - BRUSH YOUR TEETH THE MORNING OF SURGERY WITH YOUR REGULAR TOOTHPASTE   Do NOT smoke after Midnight   Take these medicines the morning of surgery with A SIP OF WATER: Rapamune,carvedilol,amlodipine,synthroid.  How to Manage Your Diabetes Before and After  Surgery  Why is it important to control my blood sugar before and after surgery? Improving blood sugar levels before and after surgery helps healing and can limit problems. A way of improving blood sugar control is eating a healthy diet by:  Eating less sugar and carbohydrates  Increasing activity/exercise  Talking with your doctor about reaching your blood sugar  goals High blood sugars (greater than 180 mg/dL) can raise your risk of infections and slow your recovery, so you will need to focus on controlling your diabetes during the weeks before surgery. Make sure that the doctor who takes care of your diabetes knows about your planned surgery including the date and location.  How do I manage my blood sugar before surgery? Check your blood sugar at least 4 times a day, starting 2 days before surgery, to make sure that the level is not too high or low. Check your blood sugar the morning of your surgery when you wake up and every 2 hours until you get to the Short Stay unit. If your blood sugar is less than 70 mg/dL, you will need to treat for low blood sugar: Do not take insulin. Treat a low blood sugar (less than 70 mg/dL) with  cup of clear juice (cranberry or apple), 4 glucose tablets, OR glucose gel. Recheck blood sugar in 15 minutes after treatment (to make sure it is greater than 70 mg/dL). If your blood sugar is not greater than 70 mg/dL on recheck, call [phone] for further instructions. Report your blood sugar to the short stay nurse when you get to Short Stay.  If you are admitted to the hospital after surgery: Your blood sugar will be checked by the staff and you will probably be given insulin after surgery (instead of oral diabetes medicines) to make sure you have good blood sugar levels. The goal for blood sugar control after surgery is 80-180 mg/dL.   WHAT DO I DO ABOUT MY DIABETES MEDICATION?  Do not take oral diabetes medicines (pills) the morning of surgery.  THE  NIGHT BEFORE SURGERY, DO NOT take humalog insulin. Take ONLY half of the lantus insulin dose.      THE MORNING OF SURGERY, DO NOT TAKE ANY ORAL DIABETIC MEDICATIONS DAY OF YOUR SURGERY. Take half of the lispro dose if CBG is more than 220.  Bring CPAP mask and tubing day of surgery.                              You may not have any metal on your body including hair pins, jewelry, and body piercing             Do not wear make-up, lotions, powders, perfumes/cologne, or deodorant  Do not wear nail polish including gel and S&S, artificial/acrylic nails, or any other type of covering on natural nails including finger and toenails. If you have artificial nails, gel coating, etc. that needs to be removed by a nail salon please have this removed prior to surgery or surgery may need to be canceled/ delayed if the surgeon/ anesthesia feels like they are unable to be safely monitored.   Do not shave  48 hours prior to surgery.   Do not bring valuables to the hospital. [place].   Contacts, dentures or bridgework may not be worn into surgery.   Bring small overnight bag day of surgery.   DO NOT [place]. PHARMACY WILL DISPENSE MEDICATIONS LISTED ON YOUR MEDICATION LIST TO YOU DURING YOUR ADMISSION [place]!    Patients discharged on the day of surgery will not be allowed to drive home.  Someone NEEDS to stay with you for the first 24  hours after anesthesia.   Special Instructions: Bring a copy of your healthcare power of attorney and living will documents         the day of surgery if you haven't scanned them before.              Please read over the following fact sheets you were given: IF YOU HAVE QUESTIONS ABOUT YOUR PRE-OP INSTRUCTIONS PLEASE CALL [phone]     [facility] - Preparing for Surgery Before surgery, you can play an important role.  Because skin is not sterile, your skin needs to be as  free of germs as possible.  You can reduce the number of germs on your skin by washing with CHG (chlorahexidine gluconate) soap before surgery.  CHG is an antiseptic cleaner which kills germs and bonds with the skin to continue killing germs even after washing. Please DO NOT use if you have an allergy to CHG or antibacterial soaps.  If your skin becomes reddened/irritated stop using the CHG and inform your nurse when you arrive at Short Stay. Do not shave (including legs and underarms) for at least 48 hours prior to the first CHG shower.  You may shave your face/neck. Please follow these instructions carefully:  1.  Shower with CHG Soap the night before surgery and the  morning of Surgery.  2.  If you choose to wash your hair, wash your hair first as usual with your  normal  shampoo.  3.  After you shampoo, rinse your hair and body thoroughly to remove the  shampoo.                           4.  Use CHG as you would any other liquid soap.  You can apply chg directly  to the skin and wash                       Gently with a scrungie or clean washcloth.  5.  Apply the CHG Soap to your body ONLY FROM THE NECK DOWN.   Do not use on face/ open                           Wound or open sores. Avoid contact with eyes, ears mouth and genitals (private parts).                       Wash face,  Genitals (private parts) with your normal soap.             6.  Wash thoroughly, paying special attention to the area where your surgery  will be performed.  7.  Thoroughly rinse your body with warm water from the neck down.  8.  DO NOT shower/wash with your normal soap after using and rinsing off  the CHG Soap.                9.  Pat yourself dry with a clean towel.            10.  Wear clean pajamas.            11.  Place clean sheets on your bed the night of your first shower and do not  sleep with pets. Day of Surgery : Do not apply any lotions/deodorants the morning of surgery.  Please wear clean clothes to the  hospital/surgery center.  FAILURE  TO FOLLOW THESE INSTRUCTIONS MAY RESULT IN THE CANCELLATION OF YOUR SURGERY PATIENT SIGNATURE_________________________________  NURSE SIGNATURE__________________________________  ________________________________________________________________________

## 2021-11-28 ENCOUNTER — Encounter (HOSPITAL_COMMUNITY)
Admission: RE | Admit: 2021-11-28 | Discharge: 2021-11-28 | Disposition: A | Discharge status: Home / Self Care | Payer: Medicare PPO | Source: Ambulatory Visit | Attending: Internal Medicine | Admitting: Internal Medicine

## 2021-11-28 MED ORDER — SODIUM CHLORIDE 0.9 % IV SOLN
375.0000 mg | INTRAVENOUS | Status: DC
  Filled 2021-11-28: qty 375

## 2021-11-29 ENCOUNTER — Ambulatory Visit (HOSPITAL_COMMUNITY)
Admission: RE | Admit: 2021-11-29 | Discharge: 2021-11-29 | Disposition: A | Discharge status: Home / Self Care | Payer: Medicare PPO | Source: Ambulatory Visit | Attending: Orthopaedic Surgery | Admitting: Orthopaedic Surgery

## 2021-11-29 ENCOUNTER — Ambulatory Visit (INDEPENDENT_AMBULATORY_CARE_PROVIDER_SITE_OTHER): Payer: Medicare PPO | Admitting: Podiatry

## 2021-11-29 ENCOUNTER — Other Ambulatory Visit: Payer: Self-pay

## 2021-11-29 ENCOUNTER — Encounter (HOSPITAL_COMMUNITY): Payer: Self-pay

## 2021-11-29 ENCOUNTER — Encounter: Payer: Self-pay | Admitting: Podiatry

## 2021-11-29 ENCOUNTER — Encounter (HOSPITAL_COMMUNITY)
Admission: RE | Admit: 2021-11-29 | Discharge: 2021-11-29 | Disposition: A | Discharge status: Home / Self Care | Payer: Medicare PPO | Source: Ambulatory Visit | Attending: Orthopaedic Surgery | Admitting: Orthopaedic Surgery

## 2021-11-29 VITALS — BP 145/67 | HR 62 | Temp 97.8°F | Ht 64.0 in | Wt 163.0 lb

## 2021-11-29 DIAGNOSIS — M1990 Unspecified osteoarthritis, unspecified site: Secondary | ICD-10-CM | POA: Diagnosis not present

## 2021-11-29 DIAGNOSIS — M79674 Pain in right toe(s): Secondary | ICD-10-CM | POA: Diagnosis not present

## 2021-11-29 DIAGNOSIS — M79675 Pain in left toe(s): Secondary | ICD-10-CM

## 2021-11-29 DIAGNOSIS — E1142 Type 2 diabetes mellitus with diabetic polyneuropathy: Secondary | ICD-10-CM | POA: Diagnosis not present

## 2021-11-29 DIAGNOSIS — I10 Essential (primary) hypertension: Secondary | ICD-10-CM | POA: Diagnosis not present

## 2021-11-29 DIAGNOSIS — E083529 Diabetes mellitus due to underlying condition with proliferative diabetic retinopathy with traction retinal detachment involving the macula, unspecified eye: Secondary | ICD-10-CM | POA: Diagnosis not present

## 2021-11-29 DIAGNOSIS — Z01818 Encounter for other preprocedural examination: Secondary | ICD-10-CM | POA: Insufficient documentation

## 2021-11-29 DIAGNOSIS — Z794 Long term (current) use of insulin: Secondary | ICD-10-CM

## 2021-11-29 DIAGNOSIS — B351 Tinea unguium: Secondary | ICD-10-CM | POA: Diagnosis not present

## 2021-11-29 HISTORY — DX: Unspecified osteoarthritis, unspecified site: M19.90

## 2021-11-29 LAB — BASIC METABOLIC PANEL
Anion gap: 6 (ref 5–15)
BUN: 22 mg/dL (ref 8–23)
CO2: 22 mmol/L (ref 22–32)
Calcium: 9.8 mg/dL (ref 8.9–10.3)
Chloride: 115 mmol/L — ABNORMAL HIGH (ref 98–111)
Creatinine, Ser: 0.84 mg/dL (ref 0.44–1.00)
GFR, Estimated: >60 mL/min (ref >60)
Glucose, Bld: 110 mg/dL — ABNORMAL HIGH (ref 70–99)
Potassium: 4.4 mmol/L (ref 3.5–5.1)
Sodium: 143 mmol/L (ref 135–145)

## 2021-11-29 LAB — CBC
HCT: 38.4 % (ref 36.0–46.0)
Hemoglobin: 11.9 g/dL — ABNORMAL LOW (ref 12.0–15.0)
MCH: 25.8 pg — ABNORMAL LOW (ref 26.0–34.0)
MCHC: 31 g/dL (ref 30.0–36.0)
MCV: 83.3 fL (ref 80.0–100.0)
Platelets: 227 10*3/uL (ref 150–400)
RBC: 4.61 MIL/uL (ref 3.87–5.11)
RDW: 18.3 % — ABNORMAL HIGH (ref 11.5–15.5)
WBC: 3.4 10*3/uL — ABNORMAL LOW (ref 4.0–10.5)
nRBC: 0 % (ref 0.0–0.2)

## 2021-11-29 LAB — GLUCOSE, CAPILLARY: Glucose-Capillary: 127 mg/dL — ABNORMAL HIGH (ref 70–99)

## 2021-11-29 LAB — HEMOGLOBIN A1C
Hgb A1c MFr Bld: 8 % — ABNORMAL HIGH (ref 4.8–5.6)
Mean Plasma Glucose: 182.9 mg/dL

## 2021-11-29 LAB — SURGICAL PCR SCREEN
MRSA, PCR: NEGATIVE
Staphylococcus aureus: NEGATIVE

## 2021-11-29 NOTE — Progress Notes (Addendum)
For Short Stay: [place] appointment date: Date of COVID positive in last 30 days:  Bowel Prep reminder:   For Anesthesia: PCP - Tristan Schroeder: NP Cardiologist -   Chest x-ray -  EKG -  Stress Test -  ECHO - 09/20/15 Cardiac Cath -  Pacemaker/ICD device last checked: Pacemaker orders received: Device Rep notified:  Spinal Cord Stimulator:  Sleep Study -  CPAP -   Fasting Blood Sugar - 80's - 100's Checks Blood Sugar : continuous free style monitor Date and result of last Hgb A1c-  Blood Thinner Instructions: Aspirin Instructions: No instructions. Last Dose:  Activity level: Can go up a flight of stairs and activities of daily living without stopping and without chest pain and/or shortness of breath   Able to exercise without chest pain and/or shortness of breath   Unable to go up a flight of stairs without chest pain and/or shortness of breath     Anesthesia review: Hx: ESRD,DIA,Kidney transplant  Patient denies shortness of breath, fever, cough and chest pain at PAT appointment   Patient verbalized understanding of instructions that were given to them at the PAT appointment. Patient was also instructed that they will need to review over the PAT instructions again at home before surgery.

## 2021-11-29 NOTE — Progress Notes (Signed)
Labs results: A1C: 8.0

## 2021-11-30 ENCOUNTER — Encounter (HOSPITAL_COMMUNITY)
Admission: RE | Admit: 2021-11-30 | Discharge: 2021-11-30 | Disposition: A | Discharge status: Home / Self Care | Payer: Medicare PPO | Source: Ambulatory Visit | Attending: Internal Medicine | Admitting: Internal Medicine

## 2021-11-30 DIAGNOSIS — E222 Syndrome of inappropriate secretion of antidiuretic hormone: Secondary | ICD-10-CM | POA: Diagnosis present

## 2021-11-30 DIAGNOSIS — Z940 Kidney transplant status: Secondary | ICD-10-CM | POA: Diagnosis present

## 2021-11-30 DIAGNOSIS — R7989 Other specified abnormal findings of blood chemistry: Secondary | ICD-10-CM | POA: Diagnosis not present

## 2021-11-30 MED ORDER — SODIUM CHLORIDE 0.9 % IV SOLN
375.0000 mg | INTRAVENOUS | Status: DC
  Administered 2021-11-30: 375 mg via INTRAVENOUS
  Filled 2021-11-30: qty 375

## 2021-12-04 NOTE — Progress Notes (Signed)
  Subjective:  Patient ID: Denise Bush, female    DOB: 11-01-1950,  MRN: [id number]  Denise Bush presents to clinic today for at risk foot care with history of diabetic neuropathy and painful elongated mycotic toenails 1-5 bilaterally which are tender when wearing enclosed shoe gear. Pain is relieved with periodic professional debridement.  Patient states blood glucose was 127 mg/dl today.  Last A1c was 8.1%.  New problem(s): None.   She is having surgery on her right knee on December 06, 2021.  PCP is Denise Hatchet, NP , and last visit was July, 2023.  Allergies  Allergen Reactions   Hydralazine Shortness Of Breath    Chest pain fatigue   Azor [Amlodipine-Olmesartan] Other (See Comments)    Sharp chest pain     Camellia Other (See Comments)   Denaverine Other (See Comments)   Diphenhydramine Hcl Other (See Comments)   Hydrochlorothiazide Other (See Comments)    "contractions in throat" Diazide pt states cause throat to swell.   Hydrochlorothiazide W-Triamterene    Labetalol     Per patient, caused foot pain   Laureth Other (See Comments)   Lisinopril Other (See Comments)    Severe radiating foot pain.   Metformin Itching    Loss of bowel control, general sick feeling   Metoclopramide Other (See Comments)   Other Swelling    "contractions in throat" Diazide pt states cause throat to swell.   Prednisone Other (See Comments)    Elevated Glucose    Minoxidil Rash    Choking, neck contractions fatigue   Spironolactone Rash and Other (See Comments)    Severe radiating pain in feet    Statins Rash    Fatigue    Review of Systems: Negative except as noted in the HPI.  Objective: No changes noted in today's physical examination.  Vascular Examination: CFT <3 seconds b/l. DP pulse palpable RLE; faintly palpable LLE. PT pulses faintly palpable b/l. Skin temperature gradient warm to warm b/l. No ischemia or gangrene. No cyanosis or clubbing noted b/l. Pedal hair  sparse.   Neurological Examination: Protective sensation diminished with 10g monofilament b/l.  Dermatological Examination: Pedal skin warm and supple b/l. Toenails 1-5 b/l thick, discolored, elongated with subungual debris and pain on dorsal palpation.  No hyperkeratotic nor porokeratotic lesions present on today's visit.  Musculoskeletal Examination: Normal muscle strength 5/5 to all lower extremity muscle groups bilaterally. No pain, crepitus or joint limitation noted with ROM b/l LE. No gross bony pedal deformities b/l. Patient ambulates independently without assistive aids.  Radiographs: None  Last A1c:      Latest Ref Rng & Units 11/29/2021   10:07 AM 01/20/2021   11:12 AM  Hemoglobin A1C  Hemoglobin-A1c 4.8 - 5.6 % 8.0  7.7    Assessment/Plan: 1. Pain due to onychomycosis of toenails of both feet   2. Diabetic polyneuropathy associated with type 2 diabetes mellitus ([place])      -Patient was evaluated and treated. All patient's and/or POA's questions/concerns answered on today's visit. -Continue foot and shoe inspections daily. Monitor blood glucose per PCP/Endocrinologist's recommendations. -Patient to continue soft, supportive shoe gear daily. -Mycotic toenails 1-5 bilaterally were debrided in length and girth with sterile nail nippers and dremel without incident. -Patient/POA to call should there be question/concern in the interim.   Return in about 3 months (around 03/01/2022).  Marzetta Board, DPM

## 2021-12-06 ENCOUNTER — Encounter (HOSPITAL_COMMUNITY): Admission: RE | Payer: Self-pay | Source: Home / Self Care

## 2021-12-06 ENCOUNTER — Ambulatory Visit (HOSPITAL_COMMUNITY): Admission: RE | Admit: 2021-12-06 | Payer: Medicare PPO | Source: Home / Self Care | Admitting: Orthopaedic Surgery

## 2021-12-06 SURGERY — ARTHROPLASTY, KNEE, TOTAL
Anesthesia: Spinal | Site: Knee | Laterality: Right

## 2021-12-12 ENCOUNTER — Ambulatory Visit: Payer: Medicare PPO | Admitting: Podiatry

## 2021-12-19 MED FILL — Sodium Chloride IV Soln 0.9%: INTRAVENOUS | Qty: 100 | Status: AC

## 2021-12-19 MED FILL — Belatacept For IV Infusion 250 MG: INTRAVENOUS | Qty: 125 | Status: AC

## 2021-12-19 MED FILL — Belatacept For IV Infusion 250 MG: INTRAVENOUS | Qty: 375 | Status: AC

## 2021-12-26 ENCOUNTER — Ambulatory Visit (HOSPITAL_COMMUNITY): Payer: Medicare PPO

## 2021-12-27 ENCOUNTER — Other Ambulatory Visit (HOSPITAL_COMMUNITY): Payer: Self-pay | Admitting: *Deleted

## 2021-12-27 DIAGNOSIS — E222 Syndrome of inappropriate secretion of antidiuretic hormone: Secondary | ICD-10-CM

## 2021-12-28 ENCOUNTER — Encounter (HOSPITAL_COMMUNITY)
Admission: RE | Admit: 2021-12-28 | Discharge: 2021-12-28 | Disposition: A | Discharge status: Home / Self Care | Payer: Medicare PPO | Source: Ambulatory Visit | Attending: Internal Medicine | Admitting: Internal Medicine

## 2021-12-28 DIAGNOSIS — E222 Syndrome of inappropriate secretion of antidiuretic hormone: Secondary | ICD-10-CM

## 2021-12-28 DIAGNOSIS — Z940 Kidney transplant status: Secondary | ICD-10-CM

## 2021-12-28 LAB — URINALYSIS, COMPLETE (UACMP) WITH MICROSCOPIC
Bilirubin Urine: NEGATIVE
Glucose, UA: NEGATIVE mg/dL
Hgb urine dipstick: NEGATIVE
Ketones, ur: NEGATIVE mg/dL
Nitrite: NEGATIVE
Protein, ur: NEGATIVE mg/dL
Specific Gravity, Urine: 1.014 (ref 1.005–1.030)
pH: 5 (ref 5.0–8.0)

## 2021-12-28 LAB — CBC WITH DIFFERENTIAL/PLATELET
Abs Immature Granulocytes: 0 10*3/uL (ref 0.00–0.07)
Basophils Absolute: 0 10*3/uL (ref 0.0–0.1)
Basophils Relative: 1 %
Eosinophils Absolute: 0.1 10*3/uL (ref 0.0–0.5)
Eosinophils Relative: 2 %
HCT: 36 % (ref 36.0–46.0)
Hemoglobin: 11.2 g/dL — ABNORMAL LOW (ref 12.0–15.0)
Lymphocytes Relative: 36 %
Lymphs Abs: 1 10*3/uL (ref 0.7–4.0)
MCH: 25.6 pg — ABNORMAL LOW (ref 26.0–34.0)
MCHC: 31.1 g/dL (ref 30.0–36.0)
MCV: 82.2 fL (ref 80.0–100.0)
Monocytes Absolute: 0.3 10*3/uL (ref 0.1–1.0)
Monocytes Relative: 12 %
Neutro Abs: 1.4 10*3/uL — ABNORMAL LOW (ref 1.7–7.7)
Neutrophils Relative %: 49 %
Platelets: 214 10*3/uL (ref 150–400)
RBC: 4.38 MIL/uL (ref 3.87–5.11)
RDW: 18.3 % — ABNORMAL HIGH (ref 11.5–15.5)
WBC: 2.8 10*3/uL — ABNORMAL LOW (ref 4.0–10.5)
nRBC: 0 % (ref 0.0–0.2)
nRBC: 0 /100 WBC

## 2021-12-28 LAB — HEMOGLOBIN A1C
Hgb A1c MFr Bld: 7.3 % — ABNORMAL HIGH (ref 4.8–5.6)
Mean Plasma Glucose: 162.81 mg/dL

## 2021-12-28 LAB — BASIC METABOLIC PANEL
Anion gap: 6 (ref 5–15)
BUN: 19 mg/dL (ref 8–23)
CO2: 21 mmol/L — ABNORMAL LOW (ref 22–32)
Calcium: 9.3 mg/dL (ref 8.9–10.3)
Chloride: 117 mmol/L — ABNORMAL HIGH (ref 98–111)
Creatinine, Ser: 0.94 mg/dL (ref 0.44–1.00)
GFR, Estimated: >60 mL/min (ref >60)
Glucose, Bld: 135 mg/dL — ABNORMAL HIGH (ref 70–99)
Potassium: 4.4 mmol/L (ref 3.5–5.1)
Sodium: 144 mmol/L (ref 135–145)

## 2021-12-28 LAB — PROTEIN / CREATININE RATIO, URINE
Creatinine, Urine: 86 mg/dL
Protein Creatinine Ratio: 0.16 mg/mg{Cre} — ABNORMAL HIGH (ref 0.00–0.15)
Total Protein, Urine: 14 mg/dL

## 2021-12-28 MED ORDER — SODIUM CHLORIDE 0.9 % IV SOLN
375.0000 mg | INTRAVENOUS | Status: DC
  Filled 2021-12-28 (×2): qty 375

## 2021-12-28 MED ORDER — SODIUM CHLORIDE 0.9 % IV SOLN
425.0000 mg | INTRAVENOUS | Status: DC
  Administered 2021-12-28: 425 mg via INTRAVENOUS
  Filled 2021-12-28: qty 425

## 2021-12-30 LAB — SIROLIMUS LEVEL: Sirolimus (Rapamycin): 5.1 ng/mL (ref 3.0–20.0)

## 2022-01-03 ENCOUNTER — Ambulatory Visit (HOSPITAL_COMMUNITY): Payer: Medicare PPO

## 2022-01-04 MED FILL — Belatacept For IV Infusion 250 MG: INTRAVENOUS | Qty: 75 | Status: AC

## 2022-01-04 MED FILL — Belatacept For IV Infusion 250 MG: INTRAVENOUS | Qty: 425 | Status: AC

## 2022-01-06 ENCOUNTER — Other Ambulatory Visit: Payer: Self-pay | Admitting: Orthopaedic Surgery

## 2022-01-16 ENCOUNTER — Other Ambulatory Visit: Payer: Self-pay

## 2022-01-16 DIAGNOSIS — E1122 Type 2 diabetes mellitus with diabetic chronic kidney disease: Secondary | ICD-10-CM

## 2022-01-16 MED ORDER — FREESTYLE LIBRE 14 DAY SENSOR MISC: 1.0000 | 1 refills | Status: DC

## 2022-01-18 ENCOUNTER — Encounter (HOSPITAL_COMMUNITY): Admission: RE | Admit: 2022-01-18 | Payer: Medicare PPO | Source: Ambulatory Visit

## 2022-01-18 NOTE — H&P (Signed)
TOTAL KNEE ADMISSION H&P  Patient is being admitted for right total knee arthroplasty.  Subjective:  Chief Complaint:right knee pain.  HPI: Denise Bush, 70 y.o. female, has a history of pain and functional disability in the right knee due to arthritis and has failed non-surgical conservative treatments for greater than 12 weeks to includeNSAID's and/or analgesics, corticosteriod injections, viscosupplementation injections, flexibility and strengthening excercises, supervised PT with diminished ADL's post treatment, use of assistive devices, weight reduction as appropriate, and activity modification.  Onset of symptoms was gradual, starting 5 years ago with gradually worsening course since that time. The patient noted prior procedures on the knee to include  arthroscopy on the right knee(s).  Patient currently rates pain in the right knee(s) at 10 out of 10 with activity. Patient has night pain, worsening of pain with activity and weight bearing, pain that interferes with activities of daily living, crepitus, and joint swelling.  Patient has evidence of subchondral cysts, subchondral sclerosis, periarticular osteophytes, and joint space narrowing by imaging studies. There is no active infection.  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 02/14/2018   Immunosuppression ([place]) 01/02/2018   Prophylactic antibiotic 01/02/2018   Type 2 diabetes mellitus treated with insulin ([place]) 02/17/2017   Cataract associated with type 2 diabetes mellitus ([place]) 02/17/2017   Overweight (BMI 25.0-29.9) 03/22/2015   Arthritis, degenerative 09/05/2014   Hyperlipidemia associated with type 2  diabetes mellitus ([place]) 06/24/2014   Vitamin D deficiency 06/24/2014   Medication management 06/24/2014   Diabetic proliferative retinopathy ([place]) 05/06/2012   Hypothyroidism 11/22/2006   Essential hypertension 11/22/2006   Allergic rhinitis 11/22/2006   Past Medical History:  Diagnosis Date   Allergy    Arthritis    Blood transfusion without reported diagnosis    Cataract    bilateral   Diabetic retinopathy ([place])    End stage renal disease on dialysis due to type 2 diabetes mellitus ([place]) 03/22/2015   ESRD (end stage renal disease) ([place])    Hashimoto's disease    Hyperlipidemia    Hyperparathyroidism, secondary renal ([place]) 06/25/2015   Hypertension    Hypothyroid    Loss of vision    both eyes, no vision left eye and little in left     Past Surgical History:  Procedure Laterality Date   ABDOMINAL HYSTERECTOMY     BREAST BIOPSY Right 2015   benign   COLONOSCOPY     EYE SURGERY     KNEE SURGERY      No current facility-administered medications for this encounter.   Current Outpatient Medications  Medication Sig Dispense Refill Last Dose   amLODipine (NORVASC) 2.5 MG tablet Take one tablet twice a day 14 tablet 0    aspirin EC 81 MG tablet Take 81 mg by mouth daily.      BELATACEPT IV Inject 1 Dose into the vein every 28 (twenty-eight) days.      carvedilol (COREG) 6.25 MG tablet Take 6.25 mg by mouth 2 (two) times daily with a meal.      ezetimibe (ZETIA) 10 MG tablet Take 1 tablet (10 mg total) by mouth daily. 90 tablet 3    gemfibrozil (LOPID) 600 MG  tablet TAKE 1 TABLET TWICE A DAY WITH MEALS FOR CHOLESTEROL AND TRIGLYCERIDES 180 tablet 3    insulin glargine (LANTUS SOLOSTAR) 100 UNIT/ML Solostar Pen INJECT 20 UNITS INTO THE SKIN DAILY FOR DIABETES (Patient taking differently: Inject 23 Units into the skin daily.) 15 mL 0    insulin lispro (HUMALOG) 100 UNIT/ML KwikPen Inject 3-5 Units into the skin 3 (three) times daily.      ketoconazole (NIZORAL) 2 % cream Apply 1  Application topically daily as needed for irritation (redness and scaliness).      levothyroxine (SYNTHROID) 125 MCG tablet Take 62.5 mcg by mouth every other day.      linagliptin (TRADJENTA) 5 MG TABS tablet Take 5 mg by mouth daily.      sirolimus (RAPAMUNE) 1 MG tablet Take    1 tablet     Daily     for Kidney Transplant (Patient taking differently: Take 2 mg by mouth daily.) 90 tablet 0    Continuous Blood Gluc Receiver (FREESTYLE LIBRE 14 DAY READER) DEVI Use 1 Application as directed      Continuous Blood Gluc Sensor (FREESTYLE LIBRE 14 DAY SENSOR) MISC Place 1 Device onto the skin every 14 (fourteen) days. DX-E11.22,n18.6,Z99.2 6 each 1    glucose blood test strip 1 each.      Insulin Pen Needle (FIFTY50 PEN NEEDLES) 31G X 5 MM MISC Test sugars Three to four times daily 400 each 3    Lancets Misc. (UNISTIK 2 NORMAL) MISC Use 1 each 4 (four) times daily Product selection permitted according to insurance preference. E11.9 Type 2 diabetes mellitus 200 each 3    mycophenolate (CELLCEPT) 250 MG capsule Take 1,000 mg by mouth every 12 (twelve) hours.      Allergies  Allergen Reactions   Hydralazine Shortness Of Breath    Chest pain fatigue   Azor [Amlodipine-Olmesartan] Other (See Comments)    Sharp chest pain     Camellia Other (See Comments)    Unknown   Denaverine Other (See Comments)    Unknown   Diphenhydramine Hcl Other (See Comments)    Unknown   Hydrochlorothiazide Other (See Comments)    "contractions in throat" Diazide pt states cause throat to swell.   Hydrochlorothiazide W-Triamterene Other (See Comments)    Chest pain   Labetalol     Per patient, caused foot pain   Laureth Other (See Comments)    Unknown   Lisinopril Other (See Comments)    Severe radiating foot pain.   Metformin Itching    Loss of bowel control, general sick feeling   Metoclopramide Other (See Comments)    Make the acid reflux worse   Other Swelling    "contractions in throat" Diazide pt  states cause throat to swell.   Prednisone Other (See Comments)    Elevated Glucose    Minoxidil Rash    Choking, neck contractions fatigue   Spironolactone Rash and Other (See Comments)    Severe radiating pain in feet    Statins Rash    Fatigue    Social History   Tobacco Use   Smoking status: Never   Smokeless tobacco: Never  Substance Use Topics   Alcohol use: No    Alcohol/week: 0.0 standard drinks of alcohol    Family History  Problem Relation Age of Onset   Breast cancer Sister    CAD Other        No family history   Colon polyps Maternal Aunt  Breast cancer Maternal Aunt    Colon polyps Maternal Uncle    Breast cancer Maternal Grandmother    Breast cancer Maternal Aunt    Colon cancer Neg Hx      Review of Systems  Musculoskeletal:  Positive for arthralgias.       Right knee   All other systems reviewed and are negative.   Objective:  Physical Exam Constitutional:      Appearance: Normal appearance.  HENT:     Head: Normocephalic and atraumatic.     Mouth/Throat:     Pharynx: Oropharynx is clear.  Eyes:     Extraocular Movements: Extraocular movements intact.  Cardiovascular:     Rate and Rhythm: Normal rate and regular rhythm.  Pulmonary:     Effort: Pulmonary effort is normal.  Abdominal:     Palpations: Abdomen is soft.  Musculoskeletal:     Cervical back: Normal range of motion.     Comments: Examination of the right knee shows range of motion from 0-120 of flexion.  She is a moderate valgus deformity with crepitation but no effusion.  Hip range of motion is full and straight leg raise is negative.  She is neurovascularly intact distally.   Skin:    General: Skin is warm and dry.  Neurological:     General: No focal deficit present.     Mental Status: She is alert and oriented to person, place, and time. Mental status is at baseline.  Psychiatric:        Mood and Affect: Mood normal.        Behavior: Behavior normal.        Thought  Content: Thought content normal.        Judgment: Judgment normal.     Vital signs in last 24 hours: BP: ()/()  Arterial Line BP: ()/()   Labs:   Estimated body mass index is 28.32 kg/m as calculated from the following:   Height as of 11/29/21: 5\' 4"  (1.626 m).   Weight as of 11/30/21: 74.8 kg.   Imaging Review Plain radiographs demonstrate severe degenerative joint disease of the right knee(s). The overall alignment isneutral. The bone quality appears to be good for age and reported activity level.  Assessment/Plan:  End stage primary arthritis, right knee   The patient history, physical examination, clinical judgment of the provider and imaging studies are consistent with end stage degenerative joint disease of the right knee(s) and total knee arthroplasty is deemed medically necessary. The treatment options including medical management, injection therapy arthroscopy and arthroplasty were discussed at length. The risks and benefits of total knee arthroplasty were presented and reviewed. The risks due to aseptic loosening, infection, stiffness, patella tracking problems, thromboembolic complications and other imponderables were discussed. The patient acknowledged the explanation, agreed to proceed with the plan and consent was signed. Patient is being admitted for inpatient treatment for surgery, pain control, PT, OT, prophylactic antibiotics, VTE prophylaxis, progressive ambulation and ADL's and discharge planning. The patient is planning to be discharged home with home health services    Patient's anticipated LOS is less than 2 midnights, meeting these requirements: - Younger than 11 - Lives within 1 hour of care - Has a competent adult at home to recover with post-op recover - NO history of  - Chronic pain requiring opiods  - Diabetes  - Coronary Artery Disease  - Heart failure  - Heart attack  - Stroke  - DVT/VTE  - Cardiac arrhythmia  - Respiratory Failure/COPD  -  Renal  failure  - Anemia  - Advanced Liver disease

## 2022-01-18 NOTE — Patient Instructions (Addendum)
SURGICAL WAITING ROOM VISITATION Patients having surgery or a procedure may have no more than 2 support people in the waiting area - these visitors may rotate.   Children under the age of 47 must have an adult with them who is not the patient. If the patient needs to stay at the hospital during part of their recovery, the visitor guidelines for inpatient rooms apply. Pre-op nurse will coordinate an appropriate time for 1 support person to accompany patient in pre-op.  This support person may not rotate.    Please refer to the [facility] website for the visitor guidelines for Inpatients (after your surgery is over and you are in a regular room).      Your procedure is scheduled on: 01-24-22   Report to [facility] Main Entrance    Report to admitting at 10:15 AM   Call this number if you have problems the morning of surgery [phone]   Do not eat food :After Midnight.   After Midnight you may have the following liquids until 9:45 AM DAY OF SURGERY  Water Non-Citrus Juices (without pulp, NO RED) Carbonated Beverages Black Coffee (NO MILK/CREAM OR CREAMERS, sugar ok)  Clear Tea (NO MILK/CREAM OR CREAMERS, sugar ok) regular and decaf                             Plain Jell-O (NO RED)                                           Fruit ices (not with fruit pulp, NO RED)                                     Popsicles (NO RED)                                                               Sports drinks like Gatorade (NO RED)                   The day of surgery:  Drink ONE (1) Pre-Surgery G2 at 9:45 AM the morning of surgery. Drink in one sitting. Do not sip.  This drink was given to you during your hospital  pre-op appointment visit. Nothing else to drink after completing the Pre-Surgery G2.          If you have questions, please contact your surgeon's office.   FOLLOW  ANY ADDITIONAL PRE OP INSTRUCTIONS YOU RECEIVED FROM YOUR SURGEON'S OFFICE!!!     Oral Hygiene is also  important to reduce your risk of infection.                                    Remember - BRUSH YOUR TEETH THE MORNING OF SURGERY WITH YOUR REGULAR TOOTHPASTE   Do NOT smoke after Midnight   Take these medicines the morning of surgery with A SIP OF WATER:   Amlodipine  Carvedilol  Ezetimibe  Gemfibrozil  Levothyroxine  How to  Manage Your Diabetes Before and After Surgery  Why is it important to control my blood sugar before and after surgery? Improving blood sugar levels before and after surgery helps healing and can limit problems. A way of improving blood sugar control is eating a healthy diet by:  Eating less sugar and carbohydrates  Increasing activity/exercise  Talking with your doctor about reaching your blood sugar goals High blood sugars (greater than 180 mg/dL) can raise your risk of infections and slow your recovery, so you will need to focus on controlling your diabetes during the weeks before surgery. Make sure that the doctor who takes care of your diabetes knows about your planned surgery including the date and location.  How do I manage my blood sugar before surgery? Check your blood sugar at least 4 times a day, starting 2 days before surgery, to make sure that the level is not too high or low. Check your blood sugar the morning of your surgery when you wake up and every 2 hours until you get to the Short Stay unit. If your blood sugar is less than 70 mg/dL, you will need to treat for low blood sugar: Do not take insulin. Treat a low blood sugar (less than 70 mg/dL) with  cup of clear juice (cranberry or apple), 4 glucose tablets, OR glucose gel. Recheck blood sugar in 15 minutes after treatment (to make sure it is greater than 70 mg/dL). If your blood sugar is not greater than 70 mg/dL on recheck, call [phone] for further instructions. Report your blood sugar to the short stay nurse when you get to Short Stay.  If you are admitted to the hospital after  surgery: Your blood sugar will be checked by the staff and you will probably be given insulin after surgery (instead of oral diabetes medicines) to make sure you have good blood sugar levels. The goal for blood sugar control after surgery is 80-180 mg/dL.   WHAT DO I DO ABOUT MY DIABETES MEDICATION?  Do not take oral diabetes medicines (pills) the morning of surgery.  THE NIGHT BEFORE SURGERY:  Take Insulin Lispro  (Humalog) as prescribed       Take 50% Insulin Glargine        Take Tradgenta as prescribed      THE MORNING OF SURGERY:  Take 50% of Humalog if CBG 220 or higher        Do not take [place].   Reviewed and Endorsed by [facility] Patient Education Committee, August 2015                               You may not have any metal on your body including hair pins, jewelry, and body piercing             Do not wear make-up, lotions, powders, perfumes or deodorant  Do not wear nail polish including gel and S&S, artificial/acrylic nails, or any other type of covering on natural nails including finger and toenails. If you have artificial nails, gel coating, etc. that needs to be removed by a nail salon please have this removed prior to surgery or surgery may need to be canceled/ delayed if the surgeon/ anesthesia feels like they are unable to be safely monitored.   Do not shave  48 hours prior to surgery.    Do not bring valuables to the hospital. [place].  Contacts, dentures or bridgework may not be worn into surgery.   Bring small overnight bag day of surgery.   DO NOT [place]. PHARMACY WILL DISPENSE MEDICATIONS LISTED ON YOUR MEDICATION LIST TO YOU DURING YOUR ADMISSION [place]!   Patients discharged on the day of surgery will not be allowed to drive home.  Someone NEEDS to stay with you for the first 24 hours after anesthesia.  Please read over the following fact sheets you were given:  IF [place]  If you received a COVID test during your pre-op visit  it is requested that you wear a mask when out in public, stay away from anyone that may not be feeling well and notify your surgeon if you develop symptoms. If you test positive for Covid or have been in contact with anyone that has tested positive in the last 10 days please notify you surgeon.  [place] - Preparing for Surgery Before surgery, you can play an important role.  Because skin is not sterile, your skin needs to be as free of germs as possible.  You can reduce the number of germs on your skin by washing with CHG (chlorahexidine gluconate) soap before surgery.  CHG is an antiseptic cleaner which kills germs and bonds with the skin to continue killing germs even after washing. Please DO NOT use if you have an allergy to CHG or antibacterial soaps.  If your skin becomes reddened/irritated stop using the CHG and inform your nurse when you arrive at Short Stay. Do not shave (including legs and underarms) for at least 48 hours prior to the first CHG shower.  You may shave your face/neck.  Please follow these instructions carefully:  1.  Shower with CHG Soap the night before surgery and the  morning of surgery.  2.  If you choose to wash your hair, wash your hair first as usual with your normal  shampoo.  3.  After you shampoo, rinse your hair and body thoroughly to remove the shampoo.                             4.  Use CHG as you would any other liquid soap.  You can apply chg directly to the skin and wash.  Gently with a scrungie or clean washcloth.  5.  Apply the CHG Soap to your body ONLY FROM THE NECK DOWN.   Do   not use on face/ open                           Wound or open sores. Avoid contact with eyes, ears mouth and   genitals (private parts).                       Wash face,  Genitals (private parts) with your normal soap.             6.  Wash  thoroughly, paying special attention to the area where your    surgery  will be performed.  7.  Thoroughly rinse your body with warm water from the neck down.  8.  DO NOT shower/wash with your normal soap after using and rinsing off the CHG Soap.                9.  Fraser Din  yourself dry with a clean towel.            10.  Wear clean pajamas.            11.  Place clean sheets on your bed the night of your first shower and do not  sleep with pets. Day of Surgery : Do not apply any lotions/deodorants the morning of surgery.  Please wear clean clothes to the hospital/surgery center.  FAILURE TO FOLLOW THESE INSTRUCTIONS MAY RESULT IN THE CANCELLATION OF YOUR SURGERY  PATIENT SIGNATURE_________________________________  NURSE SIGNATURE__________________________________  ________________________________________________________________________     Denise Bush  An incentive spirometer is a tool that can help keep your lungs clear and active. This tool measures how well you are filling your lungs with each breath. Taking long deep breaths may help reverse or decrease the chance of developing breathing (pulmonary) problems (especially infection) following: A long period of time when you are unable to move or be active. BEFORE THE PROCEDURE  If the spirometer includes an indicator to show your best effort, your nurse or respiratory therapist will set it to a desired goal. If possible, sit up straight or lean slightly forward. Try not to slouch. Hold the incentive spirometer in an upright position. INSTRUCTIONS FOR USE  Sit on the edge of your bed if possible, or sit up as far as you can in bed or on a chair. Hold the incentive spirometer in an upright position. Breathe out normally. Place the mouthpiece in your mouth and seal your lips tightly around it. Breathe in slowly and as deeply as possible, raising the piston or the ball toward the top of the column. Hold your breath for 3-5 seconds  or for as long as possible. Allow the piston or ball to fall to the bottom of the column. Remove the mouthpiece from your mouth and breathe out normally. Rest for a few seconds and repeat Steps 1 through 7 at least 10 times every 1-2 hours when you are awake. Take your time and take a few normal breaths between deep breaths. The spirometer may include an indicator to show your best effort. Use the indicator as a goal to work toward during each repetition. After each set of 10 deep breaths, practice coughing to be sure your lungs are clear. If you have an incision (the cut made at the time of surgery), support your incision when coughing by placing a pillow or rolled up towels firmly against it. Once you are able to get out of bed, walk around indoors and cough well. You may stop using the incentive spirometer when instructed by your caregiver.  RISKS AND COMPLICATIONS Take your time so you do not get dizzy or light-headed. If you are in pain, you may need to take or ask for pain medication before doing incentive spirometry. It is harder to take a deep breath if you are having pain. AFTER USE Rest and breathe slowly and easily. It can be helpful to keep track of a log of your progress. Your caregiver can provide you with a simple table to help with this. If you are using the spirometer at home, follow these instructions: [place] IF:  You are having difficultly using the spirometer. You have trouble using the spirometer as often as instructed. Your pain medication is not giving enough relief while using the spirometer. You develop fever of 100.5 F (38.1 C) or higher. SEEK IMMEDIATE MEDICAL CARE IF:  You cough up bloody sputum that had not  been present before. You develop fever of 102 F (38.9 C) or greater. You develop worsening pain at or near the incision site. MAKE SURE YOU:  Understand these instructions. Will watch your condition. Will get help right away if you are not doing  well or get worse. Document Released: 08/28/2006 Document Revised: 07/10/2011 Document Reviewed: 10/29/2006 [facility] Patient Information 2014 [place], [place].   ________________________________________________________________________

## 2022-01-18 NOTE — Progress Notes (Addendum)
COVID Vaccine Completed:  Yes  Date of COVID positive in last 90 days:  No  PCP - Cletis Athens, NP Cardiologist - Kirk Ruths, MD (last OV 2017)  Chest x-ray - 11-29-21 Epic EKG - 11-29-21 Epic Stress Test - greater than 2 years Epic ECHO - greater than 2 years Epic Cardiac Cath - N/A Pacemaker/ICD device last checked: Spinal Cord Stimulator: N/A Fistula L arm  Bowel Prep - N/A  Sleep Study - N/A CPAP -   Freestyle Libre on L arm Fasting Blood Sugar - 70 to 170 Checks Blood Sugar - 3 to 4 times a day  Blood Thinner Instructions: Aspirin Instructions:  ASA 81 mg pt states she was told to stay on Last Dose:  Activity level:  Can go up a flight of stairs and perform activities of daily living without stopping and without symptoms of chest pain or shortness of breath.  Some limitations due to knee pain  Anesthesia review:  End stage renal disease  with kidney transplant.  HTN and DM.  Grade 1 diastolic dysfunction, mild mitral regurg on echo  Patient denies shortness of breath, fever, cough and chest pain at PAT appointment  Patient verbalized understanding of instructions that were given to them at the PAT appointment. Patient was also instructed that they will need to review over the PAT instructions again at home before surgery.

## 2022-01-20 ENCOUNTER — Encounter: Payer: Medicare PPO | Admitting: Internal Medicine

## 2022-01-20 ENCOUNTER — Encounter (HOSPITAL_COMMUNITY)
Admission: RE | Admit: 2022-01-20 | Discharge: 2022-01-20 | Disposition: A | Discharge status: Home / Self Care | Payer: Medicare PPO | Source: Ambulatory Visit | Attending: Orthopaedic Surgery | Admitting: Orthopaedic Surgery

## 2022-01-20 ENCOUNTER — Encounter (HOSPITAL_COMMUNITY): Payer: Self-pay

## 2022-01-20 ENCOUNTER — Other Ambulatory Visit: Payer: Self-pay

## 2022-01-20 VITALS — BP 150/63 | HR 64 | Temp 98.3°F | Resp 14 | Ht 64.0 in | Wt 165.0 lb

## 2022-01-20 DIAGNOSIS — M1711 Unilateral primary osteoarthritis, right knee: Secondary | ICD-10-CM | POA: Insufficient documentation

## 2022-01-20 DIAGNOSIS — Z940 Kidney transplant status: Secondary | ICD-10-CM | POA: Diagnosis not present

## 2022-01-20 DIAGNOSIS — I120 Hypertensive chronic kidney disease with stage 5 chronic kidney disease or end stage renal disease: Secondary | ICD-10-CM | POA: Diagnosis not present

## 2022-01-20 DIAGNOSIS — Z794 Long term (current) use of insulin: Secondary | ICD-10-CM | POA: Diagnosis not present

## 2022-01-20 DIAGNOSIS — E1122 Type 2 diabetes mellitus with diabetic chronic kidney disease: Secondary | ICD-10-CM | POA: Insufficient documentation

## 2022-01-20 DIAGNOSIS — Z01818 Encounter for other preprocedural examination: Secondary | ICD-10-CM

## 2022-01-20 DIAGNOSIS — Z01812 Encounter for preprocedural laboratory examination: Secondary | ICD-10-CM | POA: Insufficient documentation

## 2022-01-20 DIAGNOSIS — I2510 Atherosclerotic heart disease of native coronary artery without angina pectoris: Secondary | ICD-10-CM

## 2022-01-20 DIAGNOSIS — E119 Type 2 diabetes mellitus without complications: Secondary | ICD-10-CM

## 2022-01-20 LAB — SURGICAL PCR SCREEN
MRSA, PCR: NEGATIVE
Staphylococcus aureus: NEGATIVE

## 2022-01-20 LAB — GLUCOSE, CAPILLARY: Glucose-Capillary: 95 mg/dL (ref 70–99)

## 2022-01-20 NOTE — Progress Notes (Signed)
Anesthesia Chart Review   Case: [id number] Date/Time: 01/24/22 1231   Procedure: RIGHT TOTAL KNEE ARTHROPLASTY (Right: Knee)   Anesthesia type: Spinal   Pre-op diagnosis: RIGHT KNEE DEGENERATIVE JOINT DISEASE   Location: [facility] ROOM 06 / [facility]   Surgeons: Melrose Nakayama, MD       DISCUSSION:71 y.o. never smoker with h/o HTN, ESRD s/p kidney transplant, DM II (A1C 7.3), right knee djd scheduled for above procedure 01/24/2022 with Dr. Melrose Nakayama.   Last seen by transplant 08/29/2021, stable at this visit.   VS: BP (!) 150/63   Pulse 64   Temp 36.8 C (Oral)   Resp 14   Ht 5\' 4"  (1.626 m)   Wt 74.8 kg   LMP  (LMP Unknown)   SpO2 100%   BMI 28.32 kg/m   PROVIDERS: Willene Hatchet, NP is PCP   Cardiologist - Kirk Ruths, MD LABS: Labs reviewed: Acceptable for surgery. (all labs ordered are listed, but only abnormal results are displayed)  Labs Reviewed  SURGICAL PCR SCREEN  GLUCOSE, CAPILLARY     IMAGES:   EKG:   CV: Echo 09/20/2015 - Left ventricle: The cavity size was normal. There was moderate    concentric hypertrophy. Systolic function was normal. The    estimated ejection fraction was in the range of 60% to 65%. Wall    motion was normal; there were no regional wall motion    abnormalities. There was an increased relative contribution of    atrial contraction to ventricular filling. Doppler parameters are    consistent with abnormal left ventricular relaxation (grade 1    diastolic dysfunction). Doppler parameters are consistent with    high ventricular filling pressure.  - Aortic valve: Moderate diffuse thickening and calcification    involving the right coronary and noncoronary cusp.  - Mitral valve: Moderately to severely calcified annulus. Moderate    diffuse thickening and calcification of the anterior leaflet and    posterior leaflet.   Myocardial Perfusion 08/06/2015 The left ventricular ejection fraction is mildly decreased (45-54%). Nuclear  stress EF: 50%. There was no ST segment deviation noted during stress. The study is normal.   Normal stress nuclear study with no ischemia or infarction; EF 50 but visually appears better; wall motion normal. Past Medical History:  Diagnosis Date   Allergy    Arthritis    Blood transfusion without reported diagnosis    Cataract    bilateral   Diabetic retinopathy ([place])    End stage renal disease on dialysis due to type 2 diabetes mellitus ([place]) 03/22/2015   ESRD (end stage renal disease) ([place])    Hashimoto's disease    Hyperlipidemia    Hyperparathyroidism, secondary renal ([place]) 06/25/2015   Hypertension    Hypothyroid    Loss of vision    both eyes, no vision left eye and little in left     Past Surgical History:  Procedure Laterality Date   ABDOMINAL HYSTERECTOMY     BREAST BIOPSY Right 2015   benign   COLONOSCOPY     EYE SURGERY     KNEE SURGERY      MEDICATIONS:  amLODipine (NORVASC) 2.5 MG tablet   aspirin EC 81 MG tablet   BELATACEPT IV   carvedilol (COREG) 6.25 MG tablet   Continuous Blood Gluc Receiver (FREESTYLE LIBRE 14 DAY READER) DEVI   Continuous Blood Gluc Sensor (FREESTYLE LIBRE 14 DAY SENSOR) MISC   ezetimibe (ZETIA) 10 MG tablet   gemfibrozil (LOPID) 600  MG tablet   glucose blood test strip   insulin glargine (LANTUS SOLOSTAR) 100 UNIT/ML Solostar Pen   insulin lispro (HUMALOG) 100 UNIT/ML KwikPen   Insulin Pen Needle (FIFTY50 PEN NEEDLES) 31G X 5 MM MISC   ketoconazole (NIZORAL) 2 % cream   Lancets Misc. (UNISTIK 2 NORMAL) MISC   levothyroxine (SYNTHROID) 125 MCG tablet   linagliptin (TRADJENTA) 5 MG TABS tablet   mycophenolate (CELLCEPT) 250 MG capsule   sirolimus (RAPAMUNE) 1 MG tablet   No current facility-administered medications for this encounter.     Konrad Felix Ward, PA-C [facility] Pre-Surgical Testing [phone]

## 2022-01-23 ENCOUNTER — Ambulatory Visit (HOSPITAL_COMMUNITY): Payer: Medicare PPO

## 2022-01-24 ENCOUNTER — Other Ambulatory Visit: Payer: Self-pay

## 2022-01-24 ENCOUNTER — Ambulatory Visit (HOSPITAL_COMMUNITY): Payer: Medicare PPO | Admitting: Physician Assistant

## 2022-01-24 ENCOUNTER — Encounter (HOSPITAL_COMMUNITY): Payer: Self-pay | Admitting: Orthopaedic Surgery

## 2022-01-24 ENCOUNTER — Encounter (HOSPITAL_COMMUNITY)
Admission: RE | Disposition: A | Discharge status: Home / Self Care | Payer: Self-pay | Source: Home / Self Care | Attending: Orthopaedic Surgery

## 2022-01-24 ENCOUNTER — Observation Stay (HOSPITAL_COMMUNITY)
Admission: RE | Admit: 2022-01-24 | Discharge: 2022-01-25 | Disposition: A | Discharge status: Home / Self Care | Payer: Medicare PPO | Attending: Orthopaedic Surgery | Admitting: Orthopaedic Surgery

## 2022-01-24 ENCOUNTER — Ambulatory Visit (HOSPITAL_BASED_OUTPATIENT_CLINIC_OR_DEPARTMENT_OTHER): Payer: Medicare PPO | Admitting: Anesthesiology

## 2022-01-24 DIAGNOSIS — Z794 Long term (current) use of insulin: Secondary | ICD-10-CM | POA: Diagnosis not present

## 2022-01-24 DIAGNOSIS — Z79899 Other long term (current) drug therapy: Secondary | ICD-10-CM | POA: Insufficient documentation

## 2022-01-24 DIAGNOSIS — Z992 Dependence on renal dialysis: Secondary | ICD-10-CM | POA: Diagnosis not present

## 2022-01-24 DIAGNOSIS — Z96651 Presence of right artificial knee joint: Secondary | ICD-10-CM

## 2022-01-24 DIAGNOSIS — M1711 Unilateral primary osteoarthritis, right knee: Secondary | ICD-10-CM | POA: Diagnosis not present

## 2022-01-24 DIAGNOSIS — E119 Type 2 diabetes mellitus without complications: Secondary | ICD-10-CM

## 2022-01-24 DIAGNOSIS — N186 End stage renal disease: Secondary | ICD-10-CM | POA: Diagnosis not present

## 2022-01-24 DIAGNOSIS — E039 Hypothyroidism, unspecified: Secondary | ICD-10-CM | POA: Diagnosis not present

## 2022-01-24 DIAGNOSIS — I10 Essential (primary) hypertension: Secondary | ICD-10-CM

## 2022-01-24 DIAGNOSIS — E1122 Type 2 diabetes mellitus with diabetic chronic kidney disease: Secondary | ICD-10-CM | POA: Diagnosis not present

## 2022-01-24 DIAGNOSIS — I2510 Atherosclerotic heart disease of native coronary artery without angina pectoris: Secondary | ICD-10-CM

## 2022-01-24 DIAGNOSIS — I120 Hypertensive chronic kidney disease with stage 5 chronic kidney disease or end stage renal disease: Secondary | ICD-10-CM | POA: Insufficient documentation

## 2022-01-24 DIAGNOSIS — Z7984 Long term (current) use of oral hypoglycemic drugs: Secondary | ICD-10-CM

## 2022-01-24 DIAGNOSIS — Z7982 Long term (current) use of aspirin: Secondary | ICD-10-CM | POA: Diagnosis not present

## 2022-01-24 DIAGNOSIS — Z01818 Encounter for other preprocedural examination: Secondary | ICD-10-CM

## 2022-01-24 HISTORY — PX: TOTAL KNEE ARTHROPLASTY: SHX125

## 2022-01-24 LAB — GLUCOSE, CAPILLARY
Glucose-Capillary: 200 mg/dL — ABNORMAL HIGH (ref 70–99)
Glucose-Capillary: 125 mg/dL — ABNORMAL HIGH (ref 70–99)
Glucose-Capillary: 87 mg/dL (ref 70–99)

## 2022-01-24 SURGERY — ARTHROPLASTY, KNEE, TOTAL
Anesthesia: Spinal | Site: Knee | Laterality: Right

## 2022-01-24 MED ORDER — CEFAZOLIN SODIUM-DEXTROSE 2-4 GM/100ML-% IV SOLN
2.0000 g | Freq: Four times a day (QID) | INTRAVENOUS | Status: AC
  Administered 2022-01-24 – 2022-01-25 (×2): 2 g via INTRAVENOUS
  Filled 2022-01-24 (×2): qty 100

## 2022-01-24 MED ORDER — LEVOTHYROXINE SODIUM 125 MCG PO TABS
62.5000 ug | ORAL_TABLET | ORAL | Status: DC
  Filled 2022-01-24 (×2): qty 1

## 2022-01-24 MED ORDER — ASPIRIN EC 81 MG PO TBEC: 81.0000 mg | DELAYED_RELEASE_TABLET | Freq: Two times a day (BID) | ORAL | 0 refills | Status: DC

## 2022-01-24 MED ORDER — LACTATED RINGERS IV SOLN: INTRAVENOUS | Status: DC

## 2022-01-24 MED ORDER — PHENYLEPHRINE 80 MCG/ML (10ML) SYRINGE FOR IV PUSH (FOR BLOOD PRESSURE SUPPORT)
PREFILLED_SYRINGE | INTRAVENOUS | Status: DC | PRN
  Administered 2022-01-24: 160 ug via INTRAVENOUS
  Administered 2022-01-24: 80 ug via INTRAVENOUS
  Administered 2022-01-24: 160 ug via INTRAVENOUS
  Administered 2022-01-24 (×2): 80 ug via INTRAVENOUS

## 2022-01-24 MED ORDER — TRANEXAMIC ACID-NACL 1000-0.7 MG/100ML-% IV SOLN
1000.0000 mg | Freq: Once | INTRAVENOUS | Status: AC
  Administered 2022-01-24: 1000 mg via INTRAVENOUS
  Filled 2022-01-24: qty 100

## 2022-01-24 MED ORDER — INSULIN ASPART 100 UNIT/ML IJ SOLN
2.0000 [IU] | Freq: Once | INTRAMUSCULAR | Status: AC
  Administered 2022-01-24: 2 [IU] via SUBCUTANEOUS
  Filled 2022-01-24: qty 1

## 2022-01-24 MED ORDER — LIDOCAINE HCL (PF) 2 % IJ SOLN
INTRAMUSCULAR | Status: AC
  Filled 2022-01-24: qty 5

## 2022-01-24 MED ORDER — KETOROLAC TROMETHAMINE 15 MG/ML IJ SOLN
7.5000 mg | Freq: Four times a day (QID) | INTRAMUSCULAR | Status: DC
  Administered 2022-01-24 – 2022-01-25 (×3): 7.5 mg via INTRAVENOUS
  Filled 2022-01-24 (×3): qty 1

## 2022-01-24 MED ORDER — METHOCARBAMOL 1000 MG/10ML IJ SOLN: 500.0000 mg | Freq: Four times a day (QID) | INTRAVENOUS | Status: DC | PRN

## 2022-01-24 MED ORDER — FENTANYL CITRATE PF 50 MCG/ML IJ SOSY
50.0000 ug | PREFILLED_SYRINGE | Freq: Once | INTRAMUSCULAR | Status: AC
  Administered 2022-01-24: 50 ug via INTRAVENOUS
  Filled 2022-01-24: qty 2

## 2022-01-24 MED ORDER — METHOCARBAMOL 500 MG PO TABS
500.0000 mg | ORAL_TABLET | Freq: Four times a day (QID) | ORAL | Status: DC | PRN
  Administered 2022-01-24: 500 mg via ORAL
  Filled 2022-01-24: qty 1

## 2022-01-24 MED ORDER — TRANEXAMIC ACID 1000 MG/10ML IV SOLN
2000.0000 mg | INTRAVENOUS | Status: DC
  Filled 2022-01-24: qty 20

## 2022-01-24 MED ORDER — ACETAMINOPHEN 500 MG PO TABS
500.0000 mg | ORAL_TABLET | Freq: Four times a day (QID) | ORAL | Status: DC
  Administered 2022-01-24 – 2022-01-25 (×3): 500 mg via ORAL
  Filled 2022-01-24 (×3): qty 1

## 2022-01-24 MED ORDER — PROPOFOL 500 MG/50ML IV EMUL
INTRAVENOUS | Status: AC
  Filled 2022-01-24: qty 50

## 2022-01-24 MED ORDER — EZETIMIBE 10 MG PO TABS
10.0000 mg | ORAL_TABLET | Freq: Every day | ORAL | Status: DC
  Administered 2022-01-25: 10 mg via ORAL
  Filled 2022-01-24: qty 1

## 2022-01-24 MED ORDER — POVIDONE-IODINE 10 % EX SWAB
2.0000 | Freq: Once | CUTANEOUS | Status: AC
  Administered 2022-01-24: 2 via TOPICAL

## 2022-01-24 MED ORDER — PHENOL 1.4 % MT LIQD: 1.0000 | OROMUCOSAL | Status: DC | PRN

## 2022-01-24 MED ORDER — CARVEDILOL 6.25 MG PO TABS
6.2500 mg | ORAL_TABLET | Freq: Two times a day (BID) | ORAL | Status: DC
  Administered 2022-01-25: 6.25 mg via ORAL
  Filled 2022-01-24: qty 1

## 2022-01-24 MED ORDER — GEMFIBROZIL 600 MG PO TABS
600.0000 mg | ORAL_TABLET | Freq: Two times a day (BID) | ORAL | Status: DC
  Administered 2022-01-25: 600 mg via ORAL
  Filled 2022-01-24: qty 1

## 2022-01-24 MED ORDER — ONDANSETRON HCL 4 MG PO TABS: 4.0000 mg | ORAL_TABLET | Freq: Four times a day (QID) | ORAL | Status: DC | PRN

## 2022-01-24 MED ORDER — EPHEDRINE SULFATE-NACL 50-0.9 MG/10ML-% IV SOSY
PREFILLED_SYRINGE | INTRAVENOUS | Status: DC | PRN
  Administered 2022-01-24 (×4): 5 mg via INTRAVENOUS

## 2022-01-24 MED ORDER — LACTATED RINGERS IV BOLUS
250.0000 mL | Freq: Once | INTRAVENOUS | Status: AC
  Administered 2022-01-24: 250 mL via INTRAVENOUS

## 2022-01-24 MED ORDER — EPHEDRINE 5 MG/ML INJ
INTRAVENOUS | Status: AC
  Filled 2022-01-24: qty 5

## 2022-01-24 MED ORDER — HYDROCODONE-ACETAMINOPHEN 5-325 MG PO TABS: 1.0000 | ORAL_TABLET | Freq: Four times a day (QID) | ORAL | 0 refills | Status: DC | PRN

## 2022-01-24 MED ORDER — DEXAMETHASONE SODIUM PHOSPHATE 10 MG/ML IJ SOLN
INTRAMUSCULAR | Status: DC | PRN
  Administered 2022-01-24: 4 mg via INTRAVENOUS

## 2022-01-24 MED ORDER — MIDAZOLAM HCL 2 MG/2ML IJ SOLN
1.0000 mg | Freq: Once | INTRAMUSCULAR | Status: DC
  Filled 2022-01-24: qty 2

## 2022-01-24 MED ORDER — BUPIVACAINE IN DEXTROSE 0.75-8.25 % IT SOLN
INTRATHECAL | Status: DC | PRN
  Administered 2022-01-24: 1.6 mL via INTRATHECAL

## 2022-01-24 MED ORDER — LACTATED RINGERS IV BOLUS: 250.0000 mL | Freq: Once | INTRAVENOUS | Status: DC

## 2022-01-24 MED ORDER — LIDOCAINE 2% (20 MG/ML) 5 ML SYRINGE
INTRAMUSCULAR | Status: DC | PRN
  Administered 2022-01-24: 60 mg via INTRAVENOUS

## 2022-01-24 MED ORDER — ALUM & MAG HYDROXIDE-SIMETH 200-200-20 MG/5ML PO SUSP: 30.0000 mL | ORAL | Status: DC | PRN

## 2022-01-24 MED ORDER — BUPIVACAINE-EPINEPHRINE (PF) 0.25% -1:200000 IJ SOLN
INTRAMUSCULAR | Status: AC
  Filled 2022-01-24: qty 30

## 2022-01-24 MED ORDER — ONDANSETRON HCL 4 MG/2ML IJ SOLN: 4.0000 mg | Freq: Four times a day (QID) | INTRAMUSCULAR | Status: DC | PRN

## 2022-01-24 MED ORDER — ASPIRIN 81 MG PO CHEW
81.0000 mg | CHEWABLE_TABLET | Freq: Two times a day (BID) | ORAL | Status: DC
  Administered 2022-01-25: 81 mg via ORAL
  Filled 2022-01-24: qty 1

## 2022-01-24 MED ORDER — PROPOFOL 10 MG/ML IV BOLUS
INTRAVENOUS | Status: DC | PRN
  Administered 2022-01-24 (×2): 20 mg via INTRAVENOUS

## 2022-01-24 MED ORDER — MENTHOL 3 MG MT LOZG: 1.0000 | LOZENGE | OROMUCOSAL | Status: DC | PRN

## 2022-01-24 MED ORDER — DEXAMETHASONE SODIUM PHOSPHATE 10 MG/ML IJ SOLN
INTRAMUSCULAR | Status: AC
  Filled 2022-01-24: qty 1

## 2022-01-24 MED ORDER — BISACODYL 5 MG PO TBEC: 5.0000 mg | DELAYED_RELEASE_TABLET | Freq: Every day | ORAL | Status: DC | PRN

## 2022-01-24 MED ORDER — PHENYLEPHRINE 80 MCG/ML (10ML) SYRINGE FOR IV PUSH (FOR BLOOD PRESSURE SUPPORT)
PREFILLED_SYRINGE | INTRAVENOUS | Status: AC
  Filled 2022-01-24: qty 10

## 2022-01-24 MED ORDER — SIROLIMUS 1 MG PO TABS
2.0000 mg | ORAL_TABLET | Freq: Every day | ORAL | Status: DC
  Administered 2022-01-24: 2 mg via ORAL
  Filled 2022-01-24 (×2): qty 2

## 2022-01-24 MED ORDER — PROPOFOL 500 MG/50ML IV EMUL
INTRAVENOUS | Status: DC | PRN
  Administered 2022-01-24: 75 ug/kg/min via INTRAVENOUS

## 2022-01-24 MED ORDER — TRANEXAMIC ACID-NACL 1000-0.7 MG/100ML-% IV SOLN
1000.0000 mg | INTRAVENOUS | Status: AC
  Administered 2022-01-24: 1000 mg via INTRAVENOUS
  Filled 2022-01-24: qty 100

## 2022-01-24 MED ORDER — TRANEXAMIC ACID 1000 MG/10ML IV SOLN
INTRAVENOUS | Status: DC | PRN
  Administered 2022-01-24: 2000 mg via TOPICAL

## 2022-01-24 MED ORDER — INSULIN ASPART 100 UNIT/ML IJ SOLN
0.0000 [IU] | Freq: Three times a day (TID) | INTRAMUSCULAR | Status: DC
  Administered 2022-01-25: 3 [IU] via SUBCUTANEOUS
  Administered 2022-01-25: 5 [IU] via SUBCUTANEOUS

## 2022-01-24 MED ORDER — MORPHINE SULFATE (PF) 2 MG/ML IV SOLN: 0.5000 mg | INTRAVENOUS | Status: DC | PRN

## 2022-01-24 MED ORDER — CEFAZOLIN SODIUM-DEXTROSE 2-4 GM/100ML-% IV SOLN
2.0000 g | INTRAVENOUS | Status: AC
  Administered 2022-01-24: 2 g via INTRAVENOUS
  Filled 2022-01-24: qty 100

## 2022-01-24 MED ORDER — ACETAMINOPHEN 325 MG PO TABS: 325.0000 mg | ORAL_TABLET | Freq: Four times a day (QID) | ORAL | Status: DC | PRN

## 2022-01-24 MED ORDER — ROPIVACAINE HCL 5 MG/ML IJ SOLN
INTRAMUSCULAR | Status: DC | PRN
  Administered 2022-01-24: 20 mL via PERINEURAL

## 2022-01-24 MED ORDER — ONDANSETRON HCL 4 MG/2ML IJ SOLN
INTRAMUSCULAR | Status: DC | PRN
  Administered 2022-01-24: 4 mg via INTRAVENOUS

## 2022-01-24 MED ORDER — HYDROCODONE-ACETAMINOPHEN 5-325 MG PO TABS
1.0000 | ORAL_TABLET | ORAL | Status: DC | PRN
  Filled 2022-01-24: qty 2

## 2022-01-24 MED ORDER — LACTATED RINGERS IV BOLUS
500.0000 mL | Freq: Once | INTRAVENOUS | Status: AC
  Administered 2022-01-24: 500 mL via INTRAVENOUS

## 2022-01-24 MED ORDER — ONDANSETRON HCL 4 MG/2ML IJ SOLN
INTRAMUSCULAR | Status: AC
  Filled 2022-01-24: qty 2

## 2022-01-24 MED ORDER — SODIUM CHLORIDE (PF) 0.9 % IJ SOLN
INTRAMUSCULAR | Status: AC
  Filled 2022-01-24: qty 30

## 2022-01-24 MED ORDER — SODIUM CHLORIDE 0.9 % IR SOLN
Status: DC | PRN
  Administered 2022-01-24: 3000 mL

## 2022-01-24 MED ORDER — SODIUM CHLORIDE (PF) 0.9 % IJ SOLN
INTRAMUSCULAR | Status: DC | PRN
  Administered 2022-01-24: 30 mL

## 2022-01-24 MED ORDER — BUPIVACAINE LIPOSOME 1.3 % IJ SUSP: 20.0000 mL | Freq: Once | INTRAMUSCULAR | Status: DC

## 2022-01-24 MED ORDER — AMLODIPINE BESYLATE 5 MG PO TABS
2.5000 mg | ORAL_TABLET | Freq: Two times a day (BID) | ORAL | Status: DC
  Administered 2022-01-24 – 2022-01-25 (×2): 2.5 mg via ORAL
  Filled 2022-01-24 (×2): qty 1

## 2022-01-24 MED ORDER — BUPIVACAINE LIPOSOME 1.3 % IJ SUSP
INTRAMUSCULAR | Status: DC | PRN
  Administered 2022-01-24: 20 mL

## 2022-01-24 MED ORDER — LINAGLIPTIN 5 MG PO TABS
5.0000 mg | ORAL_TABLET | Freq: Every day | ORAL | Status: DC
  Administered 2022-01-25: 5 mg via ORAL
  Filled 2022-01-24: qty 1

## 2022-01-24 MED ORDER — ACETAMINOPHEN 500 MG PO TABS
1000.0000 mg | ORAL_TABLET | Freq: Once | ORAL | Status: AC
  Administered 2022-01-24: 1000 mg via ORAL
  Filled 2022-01-24: qty 2

## 2022-01-24 MED ORDER — 0.9 % SODIUM CHLORIDE (POUR BTL) OPTIME
TOPICAL | Status: DC | PRN
  Administered 2022-01-24: 1000 mL

## 2022-01-24 MED ORDER — HYDROCODONE-ACETAMINOPHEN 7.5-325 MG PO TABS: 1.0000 | ORAL_TABLET | ORAL | Status: DC | PRN

## 2022-01-24 MED ORDER — ORAL CARE MOUTH RINSE: 15.0000 mL | Freq: Once | OROMUCOSAL | Status: AC

## 2022-01-24 MED ORDER — DOCUSATE SODIUM 100 MG PO CAPS
100.0000 mg | ORAL_CAPSULE | Freq: Two times a day (BID) | ORAL | Status: DC
  Administered 2022-01-24: 100 mg via ORAL
  Filled 2022-01-24 (×2): qty 1

## 2022-01-24 MED ORDER — PROPOFOL 10 MG/ML IV BOLUS
INTRAVENOUS | Status: AC
  Filled 2022-01-24: qty 20

## 2022-01-24 MED ORDER — CHLORHEXIDINE GLUCONATE 0.12 % MT SOLN
15.0000 mL | Freq: Once | OROMUCOSAL | Status: AC
  Administered 2022-01-24: 15 mL via OROMUCOSAL

## 2022-01-24 MED ORDER — BUPIVACAINE LIPOSOME 1.3 % IJ SUSP
INTRAMUSCULAR | Status: AC
  Filled 2022-01-24: qty 20

## 2022-01-24 MED ORDER — BUPIVACAINE-EPINEPHRINE 0.5% -1:200000 IJ SOLN
INTRAMUSCULAR | Status: DC | PRN
  Administered 2022-01-24: 30 mL

## 2022-01-24 MED ORDER — MYCOPHENOLATE MOFETIL 250 MG PO CAPS: 1000.0000 mg | ORAL_CAPSULE | Freq: Two times a day (BID) | ORAL | Status: DC

## 2022-01-24 MED ORDER — TIZANIDINE HCL 2 MG PO TABS: 2.0000 mg | ORAL_TABLET | Freq: Four times a day (QID) | ORAL | 1 refills | Status: DC | PRN

## 2022-01-24 MED ORDER — STERILE WATER FOR IRRIGATION IR SOLN
Status: DC | PRN
  Administered 2022-01-24: 2000 mL

## 2022-01-24 SURGICAL SUPPLY — 55 items
ATTUNE MED DOME PAT 32 KNEE (Knees) IMPLANT
ATTUNE PSFEM RTSZ4 NARCEM KNEE (Femur) IMPLANT
ATTUNE PSRP INSR SZ4 6 KNEE (Insert) IMPLANT
BAG COUNTER SPONGE SURGICOUNT (BAG) ×1 IMPLANT
BAG DECANTER FOR FLEXI CONT (MISCELLANEOUS) ×1 IMPLANT
BAG SPEC THK2 15X12 ZIP CLS (MISCELLANEOUS) ×1
BAG SPNG CNTER NS LX DISP (BAG) ×1
BAG ZIPLOCK 12X15 (MISCELLANEOUS) ×1 IMPLANT
BASEPLATE TIBIAL ROTATING SZ 4 (Knees) IMPLANT
BLADE SAGITTAL 25.0X1.19X90 (BLADE) ×1 IMPLANT
BLADE SAW SGTL 11.0X1.19X90.0M (BLADE) ×1 IMPLANT
BLADE SURG SZ10 CARB STEEL (BLADE) ×1 IMPLANT
BNDG ELASTIC 6X5.8 VLCR STR LF (GAUZE/BANDAGES/DRESSINGS) ×1 IMPLANT
BOOTIES KNEE HIGH SLOAN (MISCELLANEOUS) ×1 IMPLANT
BOWL SMART MIX CTS (DISPOSABLE) ×1 IMPLANT
CEMENT HV SMART SET (Cement) ×2 IMPLANT
CLSR STERI-STRIP ANTIMIC 1/2X4 (GAUZE/BANDAGES/DRESSINGS) IMPLANT
COVER SURGICAL LIGHT HANDLE (MISCELLANEOUS) ×1 IMPLANT
CUFF TOURN SGL QUICK 34 (TOURNIQUET CUFF) ×1
CUFF TRNQT CYL 34X4.125X (TOURNIQUET CUFF) ×1 IMPLANT
DRAPE INCISE IOBAN 66X45 STRL (DRAPES) ×1 IMPLANT
DRAPE SHEET LG 3/4 BI-LAMINATE (DRAPES) ×1 IMPLANT
DRAPE TOP 10253 STERILE (DRAPES) ×1 IMPLANT
DRAPE U-SHAPE 47X51 STRL (DRAPES) ×1 IMPLANT
DRSG AQUACEL AG ADV 3.5X10 (GAUZE/BANDAGES/DRESSINGS) ×1 IMPLANT
DURAPREP 26ML APPLICATOR (WOUND CARE) ×2 IMPLANT
ELECT REM PT RETURN 15FT ADLT (MISCELLANEOUS) ×1 IMPLANT
GLOVE BIO SURGEON STRL SZ 6.5 (GLOVE) IMPLANT
GLOVE BIO SURGEON STRL SZ8 (GLOVE) ×2 IMPLANT
GLOVE BIOGEL PI IND STRL 6.5 (GLOVE) IMPLANT
GLOVE BIOGEL PI IND STRL 8 (GLOVE) ×2 IMPLANT
GOWN STRL REUS W/ TWL XL LVL3 (GOWN DISPOSABLE) ×2 IMPLANT
GOWN STRL REUS W/TWL XL LVL3 (GOWN DISPOSABLE) ×2
HANDPIECE INTERPULSE COAX TIP (DISPOSABLE) ×1
HOLDER FOLEY CATH W/STRAP (MISCELLANEOUS) IMPLANT
HOOD PEEL AWAY FLYTE STAYCOOL (MISCELLANEOUS) ×3 IMPLANT
KIT TURNOVER KIT A (KITS) IMPLANT
MANIFOLD NEPTUNE II (INSTRUMENTS) ×1 IMPLANT
NEEDLE HYPO 22GX1.5 SAFETY (NEEDLE) ×1 IMPLANT
NS IRRIG 1000ML POUR BTL (IV SOLUTION) ×1 IMPLANT
PACK TOTAL KNEE CUSTOM (KITS) ×1 IMPLANT
PAD ARMBOARD 7.5X6 YLW CONV (MISCELLANEOUS) ×1 IMPLANT
PROTECTOR NERVE ULNAR (MISCELLANEOUS) ×1 IMPLANT
SET HNDPC FAN SPRY TIP SCT (DISPOSABLE) ×1 IMPLANT
SPIKE FLUID TRANSFER (MISCELLANEOUS) ×2 IMPLANT
SUT ETHIBOND NAB CT1 #1 30IN (SUTURE) ×2 IMPLANT
SUT VIC AB 0 CT1 36 (SUTURE) ×1 IMPLANT
SUT VIC AB 2-0 CT1 27 (SUTURE) ×1
SUT VIC AB 2-0 CT1 TAPERPNT 27 (SUTURE) ×1 IMPLANT
SUT VICRYL AB 3-0 FS1 BRD 27IN (SUTURE) ×1 IMPLANT
SUT VLOC 180 0 24IN GS25 (SUTURE) ×1 IMPLANT
TRAY FOLEY W/BAG SLVR 14FR LF (SET/KITS/TRAYS/PACK) IMPLANT
WATER STERILE IRR 1000ML POUR (IV SOLUTION) ×1 IMPLANT
WRAP KNEE MAXI GEL POST OP (GAUZE/BANDAGES/DRESSINGS) ×1 IMPLANT
YANKAUER SUCT BULB TIP NO VENT (SUCTIONS) ×1 IMPLANT

## 2022-01-24 NOTE — Anesthesia Procedure Notes (Signed)
Spinal  Patient location during procedure: OR Start time: 01/24/2022 12:25 PM End time: 01/24/2022 12:29 PM Reason for block: surgical anesthesia Staffing Performed: resident/CRNA  Resident/CRNA: Lollie Sails, CRNA Performed by: Lollie Sails, CRNA Authorized by: Santa Lighter, MD   Preanesthetic Checklist Completed: patient identified, IV checked, site marked, risks and benefits discussed, surgical consent, monitors and equipment checked, pre-op evaluation and timeout performed Spinal Block Patient position: sitting Prep: DuraPrep and site prepped and draped Patient monitoring: heart rate, continuous pulse ox, blood pressure and cardiac monitor Approach: midline Location: L3-4 Injection technique: single-shot Needle Needle type: Pencan  Needle gauge: 24 G Needle length: 10 cm Assessment Events: CSF return Additional Notes Expiration date on kit noted and within range.  Sterile prep and drape.    Local with Lidocaine 1%.  Good CSF flow noted with no heme or c/o paresthesia.   Patient tolerated well.

## 2022-01-24 NOTE — Anesthesia Procedure Notes (Signed)
Anesthesia Regional Block: Adductor canal block   Pre-Anesthetic Checklist: , timeout performed,  Correct Patient, Correct Site, Correct Laterality,  Correct Procedure, Correct Position, site marked,  Risks and benefits discussed,  Surgical consent,  Pre-op evaluation,  At surgeon's request and post-op pain management  Laterality: Right  Prep: chloraprep       Needles:  Injection technique: Single-shot  Needle Type: Echogenic Needle     Needle Length: 9cm  Needle Gauge: 21     Additional Needles:   Procedures:,,,, ultrasound used (permanent image in chart),,    Narrative:  Start time: 01/24/2022 11:13 AM End time: 01/24/2022 11:19 AM Injection made incrementally with aspirations every 5 mL.  Performed by: Personally  Anesthesiologist: Santa Lighter, MD  Additional Notes: No pain on injection. No increased resistance to injection. Injection made in 5cc increments.  Good needle visualization.  Patient tolerated procedure well.

## 2022-01-24 NOTE — Transfer of Care (Signed)
Immediate Anesthesia Transfer of Care Note  Patient: Denise Bush  Procedure(s) Performed: RIGHT TOTAL KNEE ARTHROPLASTY (Right: Knee)  Patient Location: PACU  Anesthesia Type:Spinal and MAC combined with regional for post-op pain  Level of Consciousness: awake, alert  and patient cooperative  Airway & Oxygen Therapy: Patient Spontanous Breathing and Patient connected to face mask oxygen  Post-op Assessment: Report given to RN and Post -op Vital signs reviewed and stable  Post vital signs: Reviewed and stable  Last Vitals:  Vitals Value Taken Time  BP 118/54 01/24/22 1415  Temp    Pulse 64 01/24/22 1416  Resp 15 01/24/22 1416  SpO2 97 % 01/24/22 1416  Vitals shown include unvalidated device data.  Last Pain:  Vitals:   01/24/22 1120  TempSrc:   PainSc: 0-No pain         Complications: No notable events documented.

## 2022-01-24 NOTE — Op Note (Signed)
PREOP DIAGNOSIS: DJD RIGHT KNEE POSTOP DIAGNOSIS: same PROCEDURE: RIGHT TKR ANESTHESIA: Spinal and MAC ATTENDING SURGEON: Hessie Dibble ASSISTANT: Loni Dolly PA  INDICATIONS FOR PROCEDURE: Denise Bush is a 71 y.o. female who has struggled for a long time with pain due to degenerative arthritis of the right knee.  The patient has failed many conservative non-operative measures and at this point has pain which limits the ability to sleep and walk.  The patient is offered total knee replacement.  Informed operative consent was obtained after discussion of possible risks of anesthesia, infection, neurovascular injury, DVT, and death.  The importance of the post-operative rehabilitation protocol to optimize result was stressed extensively with the patient.  SUMMARY OF FINDINGS AND PROCEDURE:  Denise Bush was taken to the operative suite where under the above anesthesia a right knee replacement was performed.  There were advanced degenerative changes and the bone quality was poor.  We used the DePuy Attune system and placed size 4 narrow femur, 4 tibia, 32 mm all polyethylene patella, and a size 6 mm spacer.  Loni Dolly PA-C assisted throughout and was invaluable to the completion of the case in that he helped retract and maintain exposure while I placed components.  He also helped close thereby minimizing OR time.  The patient was admitted for appropriate post-op care to include perioperative antibiotics and mechanical and pharmacologic measures for DVT prophylaxis.  DESCRIPTION OF PROCEDURE:  Denise Bush was taken to the operative suite where the above anesthesia was applied.  The patient was positioned supine and prepped and draped in normal sterile fashion.  An appropriate time out was performed.  After the administration of kefzol pre-op antibiotic the leg was elevated and exsanguinated and a tourniquet inflated. A standard longitudinal incision was made on the anterior knee.  Dissection was  carried down to the extensor mechanism.  All appropriate anti-infective measures were used including the pre-operative antibiotic, betadine impregnated drape, and closed hooded exhaust systems for each member of the surgical team.  A medial parapatellar incision was made in the extensor mechanism and the knee cap flipped and the knee flexed.  Some residual meniscal tissues were removed along with any remaining ACL/PCL tissue.  A guide was placed on the tibia and a flat cut was made on it's superior surface.  An intramedullary guide was placed in the femur and was utilized to make anterior and posterior cuts creating an appropriate flexion gap.  A second intramedullary guide was placed in the femur to make a distal cut properly balancing the knee with an extension gap equal to the flexion gap.  The three bones sized to the above mentioned sizes and the appropriate guides were placed and utilized.  A trial reduction was done and the knee easily came to full extension and the patella tracked well on flexion.  The trial components were removed and all bones were cleaned with pulsatile lavage and then dried thoroughly.  Cement was mixed and was pressurized onto the bones followed by placement of the aforementioned components.  Excess cement was trimmed and pressure was held on the components until the cement had hardened.  The tourniquet was deflated and a small amount of bleeding was controlled with cautery and pressure.  The knee was irrigated thoroughly.  The extensor mechanism was re-approximated with #1 ethibond in interrupted fashion.  The knee was flexed and the repair was solid.  The subcutaneous tissues were re-approximated with #0 and #2-0 vicryl and the skin closed  with a subcuticular stitch and steristrips.  A sterile dressing was applied.  Intraoperative fluids, EBL, and tourniquet time can be obtained from anesthesia records.  DISPOSITION:  The patient was taken to recovery room in stable condition and  scheduled to potentially go home same day depending on ability to walk and tolerate liquids.Monico Blitz Alphia Behanna 01/24/2022, 1:52 PM

## 2022-01-24 NOTE — Anesthesia Procedure Notes (Signed)
Procedure Name: MAC Date/Time: 01/24/2022 12:25 PM  Performed by: Lollie Sails, CRNAPre-anesthesia Checklist: Patient identified, Emergency Drugs available, Suction available, Patient being monitored and Timeout performed Oxygen Delivery Method: Simple face mask Placement Confirmation: positive ETCO2

## 2022-01-24 NOTE — Evaluation (Signed)
Physical Therapy Evaluation Patient Details Name: Denise Bush MRN: [id number] DOB: October 01, 1950 Today's Date: 01/24/2022  History of Present Illness  Pt is a 71yo female presenting s/p R-TKA on 01/24/22. PMH: DM with retinopathy and neuropathy, ESRD, HLD, hypothyroidism, hyperparathryoidism, HTN, reduced vision.  Clinical Impression  Denise Bush is a 71 y.o. female POD 0 s/p R-TKA. Patient reports modified independence using SPC for mobility and as visual aide at baseline. Patient is now limited by functional impairments (see PT problem list below) and requires min guard for bed mobility and for transfers, further mobility deferred secondary to presence of anesthesia. Patient instructed in exercise to facilitate ROM and circulation to manage edema. Patient will benefit from continued skilled PT interventions to address impairments and progress towards PLOF. Acute PT will follow to progress mobility and stair training in preparation for safe discharge home.       Recommendations for follow up therapy are one component of a multi-disciplinary discharge planning process, led by the attending physician.  Recommendations may be updated based on patient status, additional functional criteria and insurance authorization.  Follow Up Recommendations Follow physician's recommendations for discharge plan and follow up therapies      Assistance Recommended at Discharge Intermittent Supervision/Assistance  Patient can return home with the following  A little help with walking and/or transfers;A little help with bathing/dressing/bathroom;Assistance with cooking/housework;Assist for transportation;Help with stairs or ramp for entrance    Equipment Recommendations Rolling walker (2 wheels)  Recommendations for Other Services       Functional Status Assessment Patient has had a recent decline in their functional status and demonstrates the ability to make significant improvements in function in a  reasonable and predictable amount of time.     Precautions / Restrictions Precautions Precautions: Fall Precaution Comments: reduced vision, s/p R-TKA Restrictions Weight Bearing Restrictions: No Other Position/Activity Restrictions: wbat      Mobility  Bed Mobility Overal bed mobility: Needs Assistance Bed Mobility: Supine to Sit     Supine to sit: Min guard     General bed mobility comments: Min guard for safety, provided additional verbal cues to inform pt where PT was, additional directions, etc.    Transfers Overall transfer level: Needs assistance Equipment used: Rolling walker (2 wheels) Transfers: Bed to chair/wheelchair/BSC, Sit to/from Stand Sit to Stand: Min guard, From elevated surface   Step pivot transfers: Min guard       General transfer comment: Pt completed sit to stand transfer from elevated surface with min guard, no physical assist required. Upon standing, pt reported reduced sensation on plantar surface of R foot: "it feels weird, like pins and needles." Step pivot: pt completed several forward steps to reach recliner and then sat with multimodal cues and min guard, no overt LOB noted or physical assist required. Further mobility deferred    Ambulation/Gait               General Gait Details: deferred  Stairs            Wheelchair Mobility    Modified Rankin (Stroke Patients Only)       Balance Overall balance assessment: Needs assistance Sitting-balance support: Feet supported, No upper extremity supported Sitting balance-Leahy Scale: Good     Standing balance support: Reliant on assistive device for balance, During functional activity, Bilateral upper extremity supported Standing balance-Leahy Scale: Poor  Pertinent Vitals/Pain Pain Assessment Pain Assessment: No/denies pain    Home Living Family/patient expects to be discharged to:: Private residence Living Arrangements:  Spouse/significant other;Parent (Father lives with her but is IND) Available Help at Discharge: Family;Available 24 hours/day Type of Home: House Home Access: Stairs to enter Entrance Stairs-Rails: None Entrance Stairs-Number of Steps: 1 (1 step without railing at front door, two other entrances without steps that she can use RW to access, level approach.)   Home Layout: One level Home Equipment: Rollator (4 wheels);Cane - single point;Grab bars - tub/shower;Shower seat - built in      Prior Function Prior Level of Function : Independent/Modified Independent             Mobility Comments: Uses SPC and rollator ADLs Comments: IND     Hand Dominance        Extremity/Trunk Assessment   Upper Extremity Assessment Upper Extremity Assessment: Overall WFL for tasks assessed    Lower Extremity Assessment Lower Extremity Assessment: LLE deficits/detail;RLE deficits/detail RLE Deficits / Details: MMT ank DF/PF 5/5 RLE Sensation: WNL LLE Deficits / Details: MMT ank DF/PF 5/5 LLE Sensation: WNL    Cervical / Trunk Assessment Cervical / Trunk Assessment: Kyphotic  Communication   Communication: No difficulties  Cognition Arousal/Alertness: Awake/alert Behavior During Therapy: WFL for tasks assessed/performed Overall Cognitive Status: Within Functional Limits for tasks assessed                                          General Comments      Exercises Total Joint Exercises Ankle Circles/Pumps: AROM, Both, 5 reps   Assessment/Plan    PT Assessment Patient needs continued PT services  PT Problem List Decreased strength;Decreased range of motion;Decreased activity tolerance;Decreased balance;Decreased mobility;Decreased coordination;Pain       PT Treatment Interventions DME instruction;Gait training;Stair training;Functional mobility training;Therapeutic activities;Therapeutic exercise;Balance training;Patient/family education;Neuromuscular re-education     PT Goals (Current goals can be found in the Care Plan section)  Acute Rehab PT Goals Patient Stated Goal: To walk better PT Goal Formulation: With patient Time For Goal Achievement: 01/31/22 Potential to Achieve Goals: Good    Frequency 7X/week     Co-evaluation               AM-PAC PT "6 Clicks" Mobility  Outcome Measure Help needed turning from your back to your side while in a flat bed without using bedrails?: A Little Help needed moving from lying on your back to sitting on the side of a flat bed without using bedrails?: A Little Help needed moving to and from a bed to a chair (including a wheelchair)?: A Little Help needed standing up from a chair using your arms (e.g., wheelchair or bedside chair)?: A Little Help needed to walk in hospital room?: A Little Help needed climbing 3-5 steps with a railing? : A Little 6 Click Score: 18    End of Session Equipment Utilized During Treatment: Gait belt Activity Tolerance: Patient tolerated treatment well;No increased pain Patient left: in chair;with call bell/phone within reach;with chair alarm set;with SCD's reapplied Nurse Communication: Mobility status PT Visit Diagnosis: Pain;Difficulty in walking, not elsewhere classified (R26.2) Pain - Right/Left: Right Pain - part of body: Knee    Time: 1275-1700 PT Time Calculation (min) (ACUTE ONLY): 25 min   Charges:   PT Evaluation $PT Eval Low Complexity: 1 Low  Coolidge Breeze, PT, DPT [facility] Office: [phone]  Coolidge Breeze 01/24/2022, 7:37 PM

## 2022-01-24 NOTE — Anesthesia Preprocedure Evaluation (Addendum)
Anesthesia Evaluation  Patient identified by MRN, date of birth, ID band Patient awake    Reviewed: Allergy & Precautions, NPO status , Patient's Chart, lab work & pertinent test results, reviewed documented beta blocker date and time   Airway Mallampati: II  TM Distance: >3 FB Neck ROM: Full    Dental  (+) Dental Advisory Given, Missing   Pulmonary neg pulmonary ROS,    Pulmonary exam normal breath sounds clear to auscultation       Cardiovascular hypertension, Pt. on medications and Pt. on home beta blockers Normal cardiovascular exam Rhythm:Regular Rate:Normal     Neuro/Psych  Neuromuscular disease    GI/Hepatic negative GI ROS, Neg liver ROS,   Endo/Other  diabetes, Type 2, Insulin Dependent, Oral Hypoglycemic AgentsHypothyroidism   Renal/GU Renal disease (ESRD s/p kidney transplant)     Musculoskeletal  (+) Arthritis ,   Abdominal   Peds  Hematology negative hematology ROS (+)   Anesthesia Other Findings Day of surgery medications reviewed with the patient.  Reproductive/Obstetrics                            Anesthesia Physical Anesthesia Plan  ASA: 3  Anesthesia Plan: Spinal   Post-op Pain Management: Tylenol PO (pre-op)* and Regional block*   Induction:   PONV Risk Score and Plan: 2 and TIVA, Treatment may vary due to age or medical condition, Dexamethasone and Ondansetron  Airway Management Planned: Natural Airway and Simple Face Mask  Additional Equipment:   Intra-op Plan:   Post-operative Plan:   Informed Consent: I have reviewed the patients History and Physical, chart, labs and discussed the procedure including the risks, benefits and alternatives for the proposed anesthesia with the patient or authorized representative who has indicated his/her understanding and acceptance.     Dental advisory given  Plan Discussed with: CRNA  Anesthesia Plan Comments:          Anesthesia Quick Evaluation

## 2022-01-24 NOTE — Interval H&P Note (Signed)
History and Physical Interval Note:  01/24/2022 11:27 AM  Denise Bush  has presented today for surgery, with the diagnosis of RIGHT KNEE DEGENERATIVE JOINT DISEASE.  The various methods of treatment have been discussed with the patient and family. After consideration of risks, benefits and other options for treatment, the patient has consented to  Procedure(s): RIGHT TOTAL KNEE ARTHROPLASTY (Right) as a surgical intervention.  The patient's history has been reviewed, patient examined, no change in status, stable for surgery.  I have reviewed the patient's chart and labs.  Questions were answered to the patient's satisfaction.     Hessie Dibble

## 2022-01-25 ENCOUNTER — Encounter (HOSPITAL_COMMUNITY): Payer: Self-pay | Admitting: Orthopaedic Surgery

## 2022-01-25 ENCOUNTER — Inpatient Hospital Stay (HOSPITAL_COMMUNITY): Admission: RE | Admit: 2022-01-25 | Payer: Medicare PPO | Source: Ambulatory Visit

## 2022-01-25 DIAGNOSIS — M1711 Unilateral primary osteoarthritis, right knee: Secondary | ICD-10-CM | POA: Diagnosis not present

## 2022-01-25 LAB — GLUCOSE, CAPILLARY
Glucose-Capillary: 189 mg/dL — ABNORMAL HIGH (ref 70–99)
Glucose-Capillary: 241 mg/dL — ABNORMAL HIGH (ref 70–99)
Glucose-Capillary: 366 mg/dL — ABNORMAL HIGH (ref 70–99)

## 2022-01-25 MED ORDER — INSULIN ASPART 100 UNIT/ML IJ SOLN
8.0000 [IU] | Freq: Once | INTRAMUSCULAR | Status: AC
  Administered 2022-01-25: 8 [IU] via SUBCUTANEOUS

## 2022-01-25 NOTE — Discharge Summary (Signed)
Patient ID: Denise Bush MRN: [id number] DOB/AGE: January 07, 1951 71 y.o.  Admit date: 01/24/2022 Discharge date: 01/25/2022  Admission Diagnoses:  Principal Problem:   Primary osteoarthritis of right knee Active Problems:   S/P total knee arthroplasty, right   Discharge Diagnoses:  Same  Past Medical History:  Diagnosis Date   Allergy    Arthritis    Blood transfusion without reported diagnosis    Cataract    bilateral   Diabetic retinopathy ([place])    End stage renal disease on dialysis due to type 2 diabetes mellitus ([place]) 03/22/2015   ESRD (end stage renal disease) ([place])    Hashimoto's disease    Hyperlipidemia    Hyperparathyroidism, secondary renal ([place]) 06/25/2015   Hypertension    Hypothyroid    Loss of vision    both eyes, no vision left eye and little in left     Surgeries: Procedure(s): RIGHT TOTAL KNEE ARTHROPLASTY on 01/24/2022   Consultants:   Discharged Condition: Improved  Hospital Course: Denise Bush is an 71 y.o. female who was admitted 01/24/2022 for operative treatment ofPrimary osteoarthritis of right knee. Patient has severe unremitting pain that affects sleep, daily activities, and work/hobbies. After pre-op clearance the patient was taken to the operating room on 01/24/2022 and underwent  Procedure(s): RIGHT TOTAL KNEE ARTHROPLASTY.    Patient was given perioperative antibiotics:  Anti-infectives (From admission, onward)    Start     Dose/Rate Route Frequency Ordered Stop   01/24/22 2000  ceFAZolin (ANCEF) IVPB 2g/100 mL premix        2 g 200 mL/hr over 30 Minutes Intravenous Every 6 hours 01/24/22 1813 01/25/22 0253   01/24/22 1015  ceFAZolin (ANCEF) IVPB 2g/100 mL premix        2 g 200 mL/hr over 30 Minutes Intravenous On call to O.R. 01/24/22 1004 01/24/22 1241        Patient was given sequential compression devices, early ambulation, and chemoprophylaxis to prevent DVT.  Patient benefited maximally from hospital stay and there were no  complications.    Recent vital signs: Patient Vitals for the past 24 hrs:  BP Temp Temp src Pulse Resp SpO2  01/25/22 0934 (!) 139/58 97.8 F (36.6 C) Oral 64 14 100 %  01/25/22 0607 (!) 127/51 98.2 F (36.8 C) Oral 67 17 97 %  01/25/22 0111 (!) 145/51 98.3 F (36.8 C) Oral 84 17 96 %  01/24/22 2020 (!) 142/61 97.7 F (36.5 C) Oral 71 17 99 %  01/24/22 1809 (!) 145/51 97.8 F (36.6 C) -- 61 16 100 %  01/24/22 1730 (!) 144/70 98 F (36.7 C) -- (!) 59 15 95 %  01/24/22 1715 (!) 134/101 -- -- (!) 57 14 94 %  01/24/22 1700 (!) 151/65 -- -- 62 10 96 %  01/24/22 1645 (!) 144/61 -- -- (!) 59 10 96 %  01/24/22 1630 (!) 145/64 -- -- (!) 59 11 96 %  01/24/22 1615 (!) 141/60 -- -- (!) 54 (!) 7 96 %  01/24/22 1600 130/61 -- -- 63 14 98 %  01/24/22 1545 (!) 133/59 -- -- 60 13 100 %  01/24/22 1530 (!) 139/59 -- -- (!) 56 (!) 8 97 %  01/24/22 1515 136/60 -- -- (!) 59 (!) 9 95 %  01/24/22 1500 (!) 129/55 -- -- (!) 54 (!) 9 94 %  01/24/22 1445 (!) 130/59 -- -- 61 (!) 21 100 %  01/24/22 1415 (!) 118/54 97.6 F (36.4 C) -- 65 17  95 %     Recent laboratory studies: No results for input(s): "WBC", "HGB", "HCT", "PLT", "NA", "K", "CL", "CO2", "BUN", "CREATININE", "GLUCOSE", "INR", "CALCIUM" in the last 72 hours.  Invalid input(s): "PT", "2"   Discharge Medications:   Allergies as of 01/25/2022       Reactions   Hydralazine Shortness Of Breath   Chest pain fatigue   Azor [amlodipine-olmesartan] Other (See Comments)   Sharp chest pain    Camellia Other (See Comments)   Unknown   Denaverine Other (See Comments)   Unknown   Diphenhydramine Hcl Other (See Comments)   Unknown   Hydrochlorothiazide Other (See Comments)   "contractions in throat" Diazide pt states cause throat to swell.   Hydrochlorothiazide W-triamterene Other (See Comments)   Chest pain   Labetalol    Per patient, caused foot pain   Laureth Other (See Comments)   Unknown   Lisinopril Other (See Comments)   Severe  radiating foot pain.   Metformin Itching   Loss of bowel control, general sick feeling   Metoclopramide Other (See Comments)   Make the acid reflux worse   Other Swelling   "contractions in throat" Diazide pt states cause throat to swell.   Prednisone Other (See Comments)   Elevated Glucose    Minoxidil Rash   Choking, neck contractions fatigue   Spironolactone Rash, Other (See Comments)   Severe radiating pain in feet   Statins Rash   Fatigue        Medication List     TAKE these medications    amLODipine 2.5 MG tablet Commonly known as: NORVASC Take one tablet twice a day   aspirin EC 81 MG tablet Take 1 tablet (81 mg total) by mouth 2 (two) times daily after a meal. For 2 weeks then back to once a day for DVT prevention. What changed:  when to take this additional instructions   BELATACEPT IV Inject 1 Dose into the vein every 28 (twenty-eight) days.   carvedilol 6.25 MG tablet Commonly known as: COREG Take 6.25 mg by mouth 2 (two) times daily with a meal.   ezetimibe 10 MG tablet Commonly known as: ZETIA Take 1 tablet (10 mg total) by mouth daily.   FreeStyle Libre 14 Day Reader Kerrin Mo Use 1 Application as directed   [employer] 14 Day Sensor Misc Place 1 Device onto the skin every 14 (fourteen) days. DX-E11.22,n18.6,Z99.2   gemfibrozil 600 MG tablet Commonly known as: LOPID TAKE 1 TABLET TWICE A DAY WITH MEALS FOR CHOLESTEROL AND TRIGLYCERIDES   glucose blood test strip 1 each.   HYDROcodone-acetaminophen 5-325 MG tablet Commonly known as: NORCO/VICODIN Take 1-2 tablets by mouth every 6 (six) hours as needed for moderate pain or severe pain (post op pain).   insulin lispro 100 UNIT/ML KwikPen Commonly known as: HUMALOG Inject 3-5 Units into the skin 3 (three) times daily.   Insulin Pen Needle 31G X 5 MM Misc Commonly known as: Fifty50 Pen Needles Test sugars Three to four times daily   ketoconazole 2 % cream Commonly known as:  NIZORAL Apply 1 Application topically daily as needed for irritation (redness and scaliness).   Lantus SoloStar 100 UNIT/ML Solostar Pen Generic drug: insulin glargine INJECT 20 UNITS INTO THE SKIN DAILY FOR DIABETES What changed:  how much to take how to take this when to take this additional instructions   levothyroxine 125 MCG tablet Commonly known as: SYNTHROID Take 62.5 mcg by mouth every other day.   linagliptin  5 MG Tabs tablet Commonly known as: TRADJENTA Take 5 mg by mouth daily.   mycophenolate 250 MG capsule Commonly known as: CELLCEPT Take 1,000 mg by mouth every 12 (twelve) hours.   sirolimus 1 MG tablet Commonly known as: RAPAMUNE Take    1 tablet     Daily     for Kidney Transplant What changed:  how much to take how to take this when to take this additional instructions   tiZANidine 2 MG tablet Commonly known as: ZANAFLEX Take 1-2 tablets (2-4 mg total) by mouth every 6 (six) hours as needed for muscle spasms.   Unistik 2 Normal Misc Use 1 each 4 (four) times daily Product selection permitted according to insurance preference. E11.9 Type 2 diabetes mellitus               Durable Medical Equipment  (From admission, onward)           Start     Ordered   01/24/22 1940  For home use only DME Walker rolling  Once       Question Answer Comment  Walker: With [place] Wheels   Patient needs a walker to treat with the following condition S/P TKR (total knee replacement), right      01/24/22 1939   01/24/22 1814  DME Walker rolling  Once       Question:  Patient needs a walker to treat with the following condition  Answer:  Primary osteoarthritis of right knee   01/24/22 1813   01/24/22 1814  DME 3 n 1  Once        01/24/22 1813   01/24/22 1814  DME Bedside commode  Once       Question:  Patient needs a bedside commode to treat with the following condition  Answer:  Primary osteoarthritis of right knee   01/24/22 1813               Discharge Care Instructions  (From admission, onward)           Start     Ordered   01/25/22 0000  Weight bearing as tolerated        01/25/22 1203            Diagnostic Studies: No results found.  Disposition: Discharge disposition: 01-Home or Self Care       Discharge Instructions     Call MD / Call 911   Complete by: As directed    If you experience chest pain or shortness of breath, CALL 911 and be transported to the hospital emergency room.  If you develope a fever above 101 F, pus (white drainage) or increased drainage or redness at the wound, or calf pain, call your surgeon's office.   Call MD / Call 911   Complete by: As directed    If you experience chest pain or shortness of breath, CALL 911 and be transported to the hospital emergency room.  If you develope a fever above 101 F, pus (white drainage) or increased drainage or redness at the wound, or calf pain, call your surgeon's office.   Constipation Prevention   Complete by: As directed    Drink plenty of fluids.  Prune juice may be helpful.  You may use a stool softener, such as Colace (over the counter) 100 mg twice a day.  Use MiraLax (over the counter) for constipation as needed.   Constipation Prevention   Complete by: As directed    Drink  plenty of fluids.  Prune juice may be helpful.  You may use a stool softener, such as Colace (over the counter) 100 mg twice a day.  Use MiraLax (over the counter) for constipation as needed.   Diet - low sodium heart healthy   Complete by: As directed    Discharge instructions   Complete by: As directed    INSTRUCTIONS AFTER JOINT REPLACEMENT   Remove items at home which could result in a fall. This includes throw rugs or furniture in walking pathways ICE to the affected joint every three hours while awake for 30 minutes at a time, for at least the first 3-5 days, and then as needed for pain and swelling.  Continue to use ice for pain and swelling. You may notice  swelling that will progress down to the foot and ankle.  This is normal after surgery.  Elevate your leg when you are not up walking on it.   Continue to use the breathing machine you got in the hospital (incentive spirometer) which will help keep your temperature down.  It is common for your temperature to cycle up and down following surgery, especially at night when you are not up moving around and exerting yourself.  The breathing machine keeps your lungs expanded and your temperature down.   DIET:  As you were doing prior to hospitalization, we recommend a well-balanced diet.  DRESSING / WOUND CARE / SHOWERING  You may shower 3 days after surgery, but keep the wounds dry during showering.  You may use an occlusive plastic wrap (Press'n Seal for example), NO SOAKING/SUBMERGING IN THE BATHTUB.  If the bandage gets wet, change with a clean dry gauze.  If the incision gets wet, pat the wound dry with a clean towel.  ACTIVITY  Increase activity slowly as tolerated, but follow the weight bearing instructions below.   No driving for 6 weeks or until further direction given by your physician.  You cannot drive while taking narcotics.  No lifting or carrying greater than 10 lbs. until further directed by your surgeon. Avoid periods of inactivity such as sitting longer than an hour when not asleep. This helps prevent blood clots.  You may return to work once you are authorized by your doctor.     WEIGHT BEARING   Weight bearing as tolerated with assist device (walker, cane, etc) as directed, use it as long as suggested by your surgeon or therapist, typically at least 4-6 weeks.   EXERCISES  Results after joint replacement surgery are often greatly improved when you follow the exercise, range of motion and muscle strengthening exercises prescribed by your doctor. Safety measures are also important to protect the joint from further injury. Any time any of these exercises cause you to have  increased pain or swelling, decrease what you are doing until you are comfortable again and then slowly increase them. If you have problems or questions, call your caregiver or physical therapist for advice.   Rehabilitation is important following a joint replacement. After just a few days of immobilization, the muscles of the leg can become weakened and shrink (atrophy).  These exercises are designed to build up the tone and strength of the thigh and leg muscles and to improve motion. Often times heat used for twenty to thirty minutes before working out will loosen up your tissues and help with improving the range of motion but do not use heat for the first two weeks following surgery (sometimes heat can increase post-operative  swelling).   These exercises can be done on a training (exercise) mat, on the floor, on a table or on a bed. Use whatever works the best and is most comfortable for you.    Use music or television while you are exercising so that the exercises are a pleasant break in your day. This will make your life better with the exercises acting as a break in your routine that you can look forward to.   Perform all exercises about fifteen times, three times per day or as directed.  You should exercise both the operative leg and the other leg as well.  Exercises include:   Quad Sets - Tighten up the muscle on the front of the thigh (Quad) and hold for 5-10 seconds.   Straight Leg Raises - With your knee straight (if you were given a brace, keep it on), lift the leg to 60 degrees, hold for 3 seconds, and slowly lower the leg.  Perform this exercise against resistance later as your leg gets stronger.  Leg Slides: Lying on your back, slowly slide your foot toward your buttocks, bending your knee up off the floor (only go as far as is comfortable). Then slowly slide your foot back down until your leg is flat on the floor again.  Angel Wings: Lying on your back spread your legs to the side as far  apart as you can without causing discomfort.  Hamstring Strength:  Lying on your back, push your heel against the floor with your leg straight by tightening up the muscles of your buttocks.  Repeat, but this time bend your knee to a comfortable angle, and push your heel against the floor.  You may put a pillow under the heel to make it more comfortable if necessary.   A rehabilitation program following joint replacement surgery can speed recovery and prevent re-injury in the future due to weakened muscles. Contact your doctor or a physical therapist for more information on knee rehabilitation.    CONSTIPATION  Constipation is defined medically as fewer than three stools per week and severe constipation as less than one stool per week.  Even if you have a regular bowel pattern at home, your normal regimen is likely to be disrupted due to multiple reasons following surgery.  Combination of anesthesia, postoperative narcotics, change in appetite and fluid intake all can affect your bowels.   YOU MUST use at least one of the following options; they are listed in order of increasing strength to get the job done.  They are all available over the counter, and you may need to use some, POSSIBLY even all of these options:    Drink plenty of fluids (prune juice may be helpful) and high fiber foods Colace 100 mg by mouth twice a day  Senokot for constipation as directed and as needed Dulcolax (bisacodyl), take with full glass of water  Miralax (polyethylene glycol) once or twice a day as needed.  If you have tried all these things and are unable to have a bowel movement in the first 3-4 days after surgery call either your surgeon or your primary doctor.    If you experience loose stools or diarrhea, hold the medications until you stool forms back up.  If your symptoms do not get better within 1 week or if they get worse, check with your doctor.  If you experience "the worst abdominal pain ever" or develop  nausea or vomiting, please contact the office immediately for further recommendations for  treatment.   ITCHING:  If you experience itching with your medications, try taking only a single pain pill, or even half a pain pill at a time.  You can also use Benadryl over the counter for itching or also to help with sleep.   TED HOSE STOCKINGS:  Use stockings on both legs until for at least 2 weeks or as directed by physician office. They may be removed at night for sleeping.  MEDICATIONS:  See your medication summary on the "After Visit Summary" that nursing will review with you.  You may have some home medications which will be placed on hold until you complete the course of blood thinner medication.  It is important for you to complete the blood thinner medication as prescribed.  PRECAUTIONS:  If you experience chest pain or shortness of breath - call 911 immediately for transfer to the hospital emergency department.   If you develop a fever greater that 101 F, purulent drainage from wound, increased redness or drainage from wound, foul odor from the wound/dressing, or calf pain - CONTACT YOUR SURGEON.                                                   FOLLOW-UP APPOINTMENTS:  If you do not already have a post-op appointment, please call the office for an appointment to be seen by your surgeon.  Guidelines for how soon to be seen are listed in your "After Visit Summary", but are typically between 1-4 weeks after surgery.  OTHER INSTRUCTIONS:   Knee Replacement:  Do not place pillow under knee, focus on keeping the knee straight while resting. CPM instructions: 0-90 degrees, 2 hours in the morning, 2 hours in the afternoon, and 2 hours in the evening. Place foam block, curve side up under heel at all times except when in CPM or when walking.  DO NOT modify, tear, cut, or change the foam block in any way.  POST-OPERATIVE OPIOID TAPER INSTRUCTIONS: It is important to wean off of your opioid medication as  soon as possible. If you do not need pain medication after your surgery it is ok to stop day one. Opioids include: Codeine, Hydrocodone(Norco, Vicodin), Oxycodone(Percocet, oxycontin) and hydromorphone amongst others.  Long term and even short term use of opiods can cause: Increased pain response Dependence Constipation Depression Respiratory depression And more.  Withdrawal symptoms can include Flu like symptoms Nausea, vomiting And more Techniques to manage these symptoms Hydrate well Eat regular healthy meals Stay active Use relaxation techniques(deep breathing, meditating, yoga) Do Not substitute Alcohol to help with tapering If you have been on opioids for less than two weeks and do not have pain than it is ok to stop all together.  Plan to wean off of opioids This plan should start within one week post op of your joint replacement. Maintain the same interval or time between taking each dose and first decrease the dose.  Cut the total daily intake of opioids by one tablet each day Next start to increase the time between doses. The last dose that should be eliminated is the evening dose.     MAKE SURE YOU:  Understand these instructions.  Get help right away if you are not doing well or get worse.    Thank you for letting us be a part of your medical care team.  It is a privilege we respect greatly.  We hope these instructions will help you stay on track for a fast and full recovery!   Driving restrictions   Complete by: As directed    No driving for 2 weeks   Increase activity slowly as tolerated   Complete by: As directed    Increase activity slowly as tolerated   Complete by: As directed    Patient may shower   Complete by: As directed    You may shower without a dressing once there is no drainage.  Do not wash over the wound.  If drainage remains, cover wound with plastic wrap and then shower.   Post-operative opioid taper instructions:   Complete by: As  directed    POST-OPERATIVE OPIOID TAPER INSTRUCTIONS: It is important to wean off of your opioid medication as soon as possible. If you do not need pain medication after your surgery it is ok to stop day one. Opioids include: Codeine, Hydrocodone(Norco, Vicodin), Oxycodone(Percocet, oxycontin) and hydromorphone amongst others.  Long term and even short term use of opiods can cause: Increased pain response Dependence Constipation Depression Respiratory depression And more.  Withdrawal symptoms can include Flu like symptoms Nausea, vomiting And more Techniques to manage these symptoms Hydrate well Eat regular healthy meals Stay active Use relaxation techniques(deep breathing, meditating, yoga) Do Not substitute Alcohol to help with tapering If you have been on opioids for less than two weeks and do not have pain than it is ok to stop all together.  Plan to wean off of opioids This plan should start within one week post op of your joint replacement. Maintain the same interval or time between taking each dose and first decrease the dose.  Cut the total daily intake of opioids by one tablet each day Next start to increase the time between doses. The last dose that should be eliminated is the evening dose.      Post-operative opioid taper instructions:   Complete by: As directed    POST-OPERATIVE OPIOID TAPER INSTRUCTIONS: It is important to wean off of your opioid medication as soon as possible. If you do not need pain medication after your surgery it is ok to stop day one. Opioids include: Codeine, Hydrocodone(Norco, Vicodin), Oxycodone(Percocet, oxycontin) and hydromorphone amongst others.  Long term and even short term use of opiods can cause: Increased pain response Dependence Constipation Depression Respiratory depression And more.  Withdrawal symptoms can include Flu like symptoms Nausea, vomiting And more Techniques to manage these symptoms Hydrate well Eat  regular healthy meals Stay active Use relaxation techniques(deep breathing, meditating, yoga) Do Not substitute Alcohol to help with tapering If you have been on opioids for less than two weeks and do not have pain than it is ok to stop all together.  Plan to wean off of opioids This plan should start within one week post op of your joint replacement. Maintain the same interval or time between taking each dose and first decrease the dose.  Cut the total daily intake of opioids by one tablet each day Next start to increase the time between doses. The last dose that should be eliminated is the evening dose.      Weight bearing as tolerated   Complete by: As directed         Follow-up Information     Melrose Nakayama, MD. Schedule an appointment as soon as possible for a visit in 2 week(s).   Specialty: Orthopedic Surgery Contact information: [address]  [place] [address] [phone]                  Signed: Joanell Rising 01/25/2022, 12:04 PM

## 2022-01-25 NOTE — Progress Notes (Signed)
Discharge package printed and instructions given to pt and husband.

## 2022-01-25 NOTE — Anesthesia Postprocedure Evaluation (Signed)
Anesthesia Post Note  Patient: Denise Bush  Procedure(s) Performed: RIGHT TOTAL KNEE ARTHROPLASTY (Right: Knee)     Patient location during evaluation: PACU Anesthesia Type: Spinal and Regional Level of consciousness: awake Pain management: pain level controlled Vital Signs Assessment: post-procedure vital signs reviewed and stable Respiratory status: spontaneous breathing, nonlabored ventilation, respiratory function stable and patient connected to nasal cannula oxygen Cardiovascular status: stable and blood pressure returned to baseline Postop Assessment: no apparent nausea or vomiting Anesthetic complications: no   No notable events documented.  Last Vitals:  Vitals:   01/25/22 0111 01/25/22 0607  BP: (!) 145/51 (!) 127/51  Pulse: 84 67  Resp: 17 17  Temp: 36.8 C 36.8 C  SpO2: 96% 97%    Last Pain:  Vitals:   01/25/22 0751  TempSrc:   PainSc: 0-No pain                 [name] P Keri Veale

## 2022-01-25 NOTE — Discharge Instructions (Signed)

## 2022-01-25 NOTE — Progress Notes (Signed)
PATIENT ID: Denise Bush  MRN: [id number]  DOB/AGE:  [birth year] / 71 y.o.  1 Day Post-Op Procedure(s) (LRB): RIGHT TOTAL KNEE ARTHROPLASTY (Right)    PROGRESS NOTE Subjective: Patient is alert, oriented, no Nausea, no Vomiting, yes passing gas. Taking PO well. Denies SOB, Chest or Calf Pain. Using Incentive Spirometer, PAS in place. Ambulate WBAT with patient passing therapy goals, Patient reports pain as mild.    Objective: Vital signs in last 24 hours: Vitals:   01/24/22 2020 01/25/22 0111 01/25/22 0607 01/25/22 0934  BP: (!) 142/61 (!) 145/51 (!) 127/51 (!) 139/58  Pulse: 71 84 67 64  Resp: 17 17 17 14   Temp: 97.7 F (36.5 C) 98.3 F (36.8 C) 98.2 F (36.8 C) 97.8 F (36.6 C)  TempSrc: Oral Oral Oral Oral  SpO2: 99% 96% 97% 100%  Weight:          Intake/Output from previous day: I/O last 3 completed shifts: In: 3664.7 [P.O.:660; I.V.:2054.7; IV Piggyback:950] Out: 1740 [CXKGY:1856; Blood:75]   Intake/Output this shift: Total I/O In: 240 [P.O.:240] Out: -    LABORATORY DATA: Recent Labs    01/25/22 0015 01/25/22 0753 01/25/22 1133  GLUCAP 366* 241* 189*    Examination: Neurologically intact Neurovascular intact Sensation intact distally Intact pulses distally Dorsiflexion/Plantar flexion intact Incision: dressing C/D/I and no drainage No cellulitis present Compartment soft}  Assessment:   1 Day Post-Op Procedure(s) (LRB): RIGHT TOTAL KNEE ARTHROPLASTY (Right) ADDITIONAL DIAGNOSIS: Expected Acute Blood Loss Anemia, Diabetes, Hypertension, and Renal Insufficiency Chronic  Patient's anticipated LOS is less than 2 midnights, meeting these requirements: - Younger than 70 - Lives within 1 hour of care - Has a competent adult at home to recover with post-op recover - NO history of  - Chronic pain requiring opiods  - Diabetes  - Coronary Artery Disease  - Heart failure  - Heart attack  - Stroke  - DVT/VTE  - Cardiac arrhythmia  - Respiratory  Failure/COPD  - Renal failure  - Anemia  - Advanced Liver disease     Plan: PT/OT WBAT, AROM and PROM  DVT Prophylaxis:  SCDx72hrs, ASA 81 mg BID x 2 weeks DISCHARGE PLAN: Home DISCHARGE NEEDS: HHPT, Walker, and 3-in-1 comode seat     Joanell Rising 01/25/2022, 12:00 PM

## 2022-01-25 NOTE — Plan of Care (Signed)
  Problem: Activity: Goal: Risk for activity intolerance will decrease Outcome: Progressing   Problem: Pain Managment: Goal: General experience of comfort will improve Outcome: Progressing   Problem: Safety: Goal: Ability to remain free from injury will improve Outcome: Progressing   

## 2022-01-25 NOTE — Progress Notes (Signed)
Physical Therapy Treatment Patient Details Name: Denise Bush MRN: [id number] DOB: 28-Oct-1950 Today's Date: 01/25/2022   History of Present Illness Pt is a 71yo female presenting s/p R-TKA on 01/24/22. PMH: DM with retinopathy and neuropathy, ESRD, HLD, hypothyroidism, hyperparathryoidism, HTN, reduced vision.    PT Comments    Pt progressing well during PT session. Incr amb distance, reviewed HEP. Pt with excellent knee ROM also. Ready for d/c with family assist as needed from PT standpoint, will likely need initial min/guard assist d/t her visual deficits however being in a familiar environment at home should be beneficial.   Recommendations for follow up therapy are one component of a multi-disciplinary discharge planning process, led by the attending physician.  Recommendations may be updated based on patient status, additional functional criteria and insurance authorization.  Follow Up Recommendations  Follow physician's recommendations for discharge plan and follow up therapies     Assistance Recommended at Discharge Intermittent Supervision/Assistance  Patient can return home with the following A little help with walking and/or transfers;A little help with bathing/dressing/bathroom;Assistance with cooking/housework;Assist for transportation;Help with stairs or ramp for entrance   Equipment Recommendations  Rolling walker (2 wheels)    Recommendations for Other Services       Precautions / Restrictions Precautions Precautions: Fall;Knee Precaution Comments: visual impairment Restrictions Weight Bearing Restrictions: No Other Position/Activity Restrictions: wbat     Mobility  Bed Mobility               General bed mobility comments: sitting EOB  with bedrails x4 on arrival    Transfers Overall transfer level: Needs assistance Equipment used: Rolling walker (2 wheels)   Sit to Stand: Min guard, Supervision           General transfer comment: tactile cues  for hand placement    Ambulation/Gait Ambulation/Gait assistance: Min guard, Supervision Gait Distance (Feet): 55 Feet Assistive device: Rolling walker (2 wheels) Gait Pattern/deviations: Step-to pattern       General Gait Details: cues for direction d/t low vision, cues for initial sequence and min/guard to close supervision for safety   Stairs             Wheelchair Mobility    Modified Rankin (Stroke Patients Only)       Balance           Standing balance support: Reliant on assistive device for balance, During functional activity, Bilateral upper extremity supported Standing balance-Leahy Scale: Poor                              Cognition Arousal/Alertness: Awake/alert Behavior During Therapy: WFL for tasks assessed/performed Overall Cognitive Status: Within Functional Limits for tasks assessed                                          Exercises Total Joint Exercises Ankle Circles/Pumps: AROM, Both, 15 reps Quad Sets: AROM, Both, 10 reps Heel Slides: AAROM, Right, AROM, 10 reps Straight Leg Raises: AROM, AAROM, Right, 10 reps    General Comments        Pertinent Vitals/Pain Pain Assessment Pain Assessment: No/denies pain    Home Living                          Prior Function  PT Goals (current goals can now be found in the care plan section) Acute Rehab PT Goals Patient Stated Goal: To walk better PT Goal Formulation: With patient Time For Goal Achievement: 01/31/22 Potential to Achieve Goals: Good Progress towards PT goals: Progressing toward goals    Frequency    7X/week      PT Plan Current plan remains appropriate    Co-evaluation              AM-PAC PT "6 Clicks" Mobility   Outcome Measure  Help needed turning from your back to your side while in a flat bed without using bedrails?: A Little Help needed moving from lying on your back to sitting on the side of a  flat bed without using bedrails?: A Little Help needed moving to and from a bed to a chair (including a wheelchair)?: A Little Help needed standing up from a chair using your arms (e.g., wheelchair or bedside chair)?: A Little Help needed to walk in hospital room?: A Little Help needed climbing 3-5 steps with a railing? : A Little 6 Click Score: 18    End of Session Equipment Utilized During Treatment: Gait belt Activity Tolerance: Patient tolerated treatment well;No increased pain Patient left: in chair;with call bell/phone within reach;with chair alarm set;with family/visitor present;with nursing/sitter in room Nurse Communication: Mobility status PT Visit Diagnosis: Pain;Difficulty in walking, not elsewhere classified (R26.2) Pain - Right/Left: Right Pain - part of body: Knee     Time: 1131-1157 PT Time Calculation (min) (ACUTE ONLY): 26 min  Charges:  $Gait Training: 8-22 mins $Therapeutic Exercise: 8-22 mins                     Baxter Flattery, PT  Acute Rehab Dept [facility]) [phone]  [facility] Weekend Pager ([place] only)  [phone]  01/25/2022    [facility] 01/25/2022, 12:09 PM

## 2022-02-01 ENCOUNTER — Ambulatory Visit (HOSPITAL_COMMUNITY)
Admission: RE | Admit: 2022-02-01 | Discharge: 2022-02-01 | Disposition: A | Discharge status: Home / Self Care | Payer: Medicare PPO | Source: Ambulatory Visit | Attending: Internal Medicine | Admitting: Internal Medicine

## 2022-02-01 DIAGNOSIS — Z5181 Encounter for therapeutic drug level monitoring: Secondary | ICD-10-CM | POA: Insufficient documentation

## 2022-02-01 DIAGNOSIS — Z940 Kidney transplant status: Secondary | ICD-10-CM | POA: Diagnosis present

## 2022-02-01 MED ORDER — SODIUM CHLORIDE 0.9 % IV SOLN
425.0000 mg | INTRAVENOUS | Status: DC
  Administered 2022-02-01: 425 mg via INTRAVENOUS
  Filled 2022-02-01: qty 425

## 2022-02-16 ENCOUNTER — Other Ambulatory Visit: Payer: Self-pay | Admitting: Nurse Practitioner

## 2022-02-16 DIAGNOSIS — E1169 Type 2 diabetes mellitus with other specified complication: Secondary | ICD-10-CM

## 2022-02-22 ENCOUNTER — Encounter (HOSPITAL_COMMUNITY): Payer: Medicare PPO

## 2022-03-01 ENCOUNTER — Encounter (HOSPITAL_COMMUNITY): Payer: Medicare PPO

## 2022-03-02 ENCOUNTER — Other Ambulatory Visit (HOSPITAL_COMMUNITY): Payer: Self-pay

## 2022-03-02 DIAGNOSIS — Z940 Kidney transplant status: Secondary | ICD-10-CM

## 2022-03-03 ENCOUNTER — Ambulatory Visit (HOSPITAL_COMMUNITY)
Admission: RE | Admit: 2022-03-03 | Discharge: 2022-03-03 | Disposition: A | Discharge status: Home / Self Care | Payer: Medicare PPO | Source: Ambulatory Visit | Attending: Internal Medicine | Admitting: Internal Medicine

## 2022-03-03 DIAGNOSIS — Z940 Kidney transplant status: Secondary | ICD-10-CM | POA: Diagnosis present

## 2022-03-03 LAB — CBC WITH DIFFERENTIAL/PLATELET
Abs Immature Granulocytes: 0 10*3/uL (ref 0.00–0.07)
Basophils Absolute: 0 10*3/uL (ref 0.0–0.1)
Basophils Relative: 1 %
Eosinophils Absolute: 0 10*3/uL (ref 0.0–0.5)
Eosinophils Relative: 1 %
HCT: 35.2 % — ABNORMAL LOW (ref 36.0–46.0)
Hemoglobin: 10.8 g/dL — ABNORMAL LOW (ref 12.0–15.0)
Lymphocytes Relative: 36 %
Lymphs Abs: 1 10*3/uL (ref 0.7–4.0)
MCH: 28.1 pg (ref 26.0–34.0)
MCHC: 30.7 g/dL (ref 30.0–36.0)
MCV: 91.4 fL (ref 80.0–100.0)
Monocytes Absolute: 0.3 10*3/uL (ref 0.1–1.0)
Monocytes Relative: 12 %
Neutro Abs: 1.4 10*3/uL — ABNORMAL LOW (ref 1.7–7.7)
Neutrophils Relative %: 50 %
Platelets: 254 10*3/uL (ref 150–400)
RBC: 3.85 MIL/uL — ABNORMAL LOW (ref 3.87–5.11)
RDW: 21.2 % — ABNORMAL HIGH (ref 11.5–15.5)
WBC: 2.7 10*3/uL — ABNORMAL LOW (ref 4.0–10.5)
nRBC: 0 % (ref 0.0–0.2)
nRBC: 0 /100 WBC

## 2022-03-03 LAB — URINALYSIS, COMPLETE (UACMP) WITH MICROSCOPIC
Bilirubin Urine: NEGATIVE
Glucose, UA: NEGATIVE mg/dL
Hgb urine dipstick: NEGATIVE
Ketones, ur: NEGATIVE mg/dL
Nitrite: NEGATIVE
Protein, ur: NEGATIVE mg/dL
Specific Gravity, Urine: 1.02 (ref 1.005–1.030)
pH: 5 (ref 5.0–8.0)

## 2022-03-03 LAB — BASIC METABOLIC PANEL
Anion gap: 7 (ref 5–15)
BUN: 16 mg/dL (ref 8–23)
CO2: 24 mmol/L (ref 22–32)
Calcium: 9.8 mg/dL (ref 8.9–10.3)
Chloride: 108 mmol/L (ref 98–111)
Creatinine, Ser: 0.94 mg/dL (ref 0.44–1.00)
GFR, Estimated: >60 mL/min (ref >60)
Glucose, Bld: 193 mg/dL — ABNORMAL HIGH (ref 70–99)
Potassium: 5.2 mmol/L — ABNORMAL HIGH (ref 3.5–5.1)
Sodium: 139 mmol/L (ref 135–145)

## 2022-03-03 LAB — PROTEIN / CREATININE RATIO, URINE
Creatinine, Urine: 135 mg/dL
Protein Creatinine Ratio: 0.14 mg/mg{Cre} (ref 0.00–0.15)
Total Protein, Urine: 19 mg/dL

## 2022-03-03 MED ORDER — SODIUM CHLORIDE 0.9 % IV SOLN
375.0000 mg | INTRAVENOUS | Status: DC
  Administered 2022-03-03: 375 mg via INTRAVENOUS
  Filled 2022-03-03: qty 375

## 2022-03-03 MED ORDER — SODIUM CHLORIDE 0.9 % IV SOLN
425.0000 mg | INTRAVENOUS | Status: DC
  Filled 2022-03-03: qty 425

## 2022-03-07 LAB — SIROLIMUS LEVEL: Sirolimus (Rapamycin): <0.5 ng/mL — ABNORMAL LOW (ref 3.0–20.0)

## 2022-03-30 ENCOUNTER — Other Ambulatory Visit (HOSPITAL_COMMUNITY): Payer: Self-pay

## 2022-03-30 DIAGNOSIS — Z940 Kidney transplant status: Secondary | ICD-10-CM

## 2022-03-31 ENCOUNTER — Ambulatory Visit: Payer: Medicare PPO | Admitting: Podiatry

## 2022-03-31 ENCOUNTER — Encounter (HOSPITAL_COMMUNITY)
Admission: RE | Admit: 2022-03-31 | Discharge: 2022-03-31 | Disposition: A | Discharge status: Home / Self Care | Payer: Medicare PPO | Source: Ambulatory Visit | Attending: Internal Medicine | Admitting: Internal Medicine

## 2022-03-31 DIAGNOSIS — Z940 Kidney transplant status: Secondary | ICD-10-CM | POA: Diagnosis present

## 2022-03-31 LAB — CBC WITH DIFFERENTIAL/PLATELET
Abs Immature Granulocytes: 0 10*3/uL (ref 0.00–0.07)
Basophils Absolute: 0 10*3/uL (ref 0.0–0.1)
Basophils Relative: 2 %
Eosinophils Absolute: 0.1 10*3/uL (ref 0.0–0.5)
Eosinophils Relative: 4 %
HCT: 36.9 % (ref 36.0–46.0)
Hemoglobin: 11.4 g/dL — ABNORMAL LOW (ref 12.0–15.0)
Lymphocytes Relative: 51 %
Lymphs Abs: 1.1 10*3/uL (ref 0.7–4.0)
MCH: 28.5 pg (ref 26.0–34.0)
MCHC: 30.9 g/dL (ref 30.0–36.0)
MCV: 92.3 fL (ref 80.0–100.0)
Monocytes Absolute: 0.2 10*3/uL (ref 0.1–1.0)
Monocytes Relative: 11 %
Neutro Abs: 0.7 10*3/uL — ABNORMAL LOW (ref 1.7–7.7)
Neutrophils Relative %: 32 %
Platelets: 277 10*3/uL (ref 150–400)
RBC: 4 MIL/uL (ref 3.87–5.11)
RDW: 19.1 % — ABNORMAL HIGH (ref 11.5–15.5)
WBC: 2.1 10*3/uL — ABNORMAL LOW (ref 4.0–10.5)
nRBC: 0 % (ref 0.0–0.2)
nRBC: 0 /100 WBC

## 2022-03-31 LAB — BASIC METABOLIC PANEL
Anion gap: 9 (ref 5–15)
BUN: 17 mg/dL (ref 8–23)
CO2: 21 mmol/L — ABNORMAL LOW (ref 22–32)
Calcium: 9.6 mg/dL (ref 8.9–10.3)
Chloride: 112 mmol/L — ABNORMAL HIGH (ref 98–111)
Creatinine, Ser: 0.86 mg/dL (ref 0.44–1.00)
GFR, Estimated: >60 mL/min (ref >60)
Glucose, Bld: 150 mg/dL — ABNORMAL HIGH (ref 70–99)
Potassium: 4.2 mmol/L (ref 3.5–5.1)
Sodium: 142 mmol/L (ref 135–145)

## 2022-03-31 MED ORDER — SODIUM CHLORIDE 0.9 % IV SOLN
5.0000 mg/kg | INTRAVENOUS | Status: DC
  Administered 2022-03-31: 350 mg via INTRAVENOUS
  Filled 2022-03-31: qty 350

## 2022-03-31 NOTE — Progress Notes (Signed)
Patient accidentally dropped urine cup in toilette. Will need to collect next visit.

## 2022-04-01 LAB — BK QUANT PCR (PLASMA/SERUM)
BK Quantitaion PCR: 29 IU/mL
Log10 BK Qn PCR: 1.462 log10 IU/mL

## 2022-04-05 LAB — SIROLIMUS LEVEL: Sirolimus (Rapamycin): <0.5 ng/mL — ABNORMAL LOW (ref 3.0–20.0)

## 2022-04-10 ENCOUNTER — Ambulatory Visit (INDEPENDENT_AMBULATORY_CARE_PROVIDER_SITE_OTHER): Payer: Medicare PPO | Admitting: Podiatry

## 2022-04-10 ENCOUNTER — Encounter: Payer: Self-pay | Admitting: Podiatry

## 2022-04-10 VITALS — BP 140/78

## 2022-04-10 DIAGNOSIS — E1142 Type 2 diabetes mellitus with diabetic polyneuropathy: Secondary | ICD-10-CM | POA: Diagnosis not present

## 2022-04-10 DIAGNOSIS — M79675 Pain in left toe(s): Secondary | ICD-10-CM | POA: Diagnosis not present

## 2022-04-10 DIAGNOSIS — Z940 Kidney transplant status: Secondary | ICD-10-CM

## 2022-04-10 DIAGNOSIS — B351 Tinea unguium: Secondary | ICD-10-CM | POA: Diagnosis not present

## 2022-04-10 DIAGNOSIS — M79674 Pain in right toe(s): Secondary | ICD-10-CM

## 2022-04-10 NOTE — Progress Notes (Signed)
This patient returns to my office for at risk foot care.  This patient requires this care by a professional since this patient will be at risk due to having diabetic neuropathy.  Patient doing well since her kidney transplant.  This patient is unable to cut her  nails  since the patient cannot reach her nails.These nails are painful walking and wearing shoes.  This patient presents for at risk foot care today.  General Appearance  Alert, conversant and in no acute stress.  Vascular  Dorsalis pedis and posterior tibial  pulses are palpable  bilaterally.  Capillary return is within normal limits  bilaterally. Temperature is within normal limits  bilaterally.  Neurologic  Senn-Weinstein monofilament wire test diminished   bilaterally. Muscle power within normal limits bilaterally.  Nails Thick disfigured discolored nails with subungual debris  from hallux to fifth toes bilaterally. No evidence of bacterial infection or drainage bilaterally.  Orthopedic  No limitations of motion  feet .  No crepitus or effusions noted.  No bony pathology or digital deformities noted.  Skin  normotropic skin with no porokeratosis noted bilaterally.  No signs of infections or ulcers noted.     Onychomycosis  Pain in right toes  Pain in left toes  Consent was obtained for treatment procedures.   Mechanical debridement of nails 1-5  bilaterally performed with a nail nipper.  Filed with dremel without incident.    Return office visit  3 months                      Told patient to return for periodic foot care and evaluation due to potential at risk complications.   Gardiner Barefoot DPM

## 2022-04-11 ENCOUNTER — Encounter (HOSPITAL_BASED_OUTPATIENT_CLINIC_OR_DEPARTMENT_OTHER): Payer: Medicare PPO | Attending: Internal Medicine | Admitting: Internal Medicine

## 2022-04-11 DIAGNOSIS — I120 Hypertensive chronic kidney disease with stage 5 chronic kidney disease or end stage renal disease: Secondary | ICD-10-CM | POA: Insufficient documentation

## 2022-04-11 DIAGNOSIS — E1122 Type 2 diabetes mellitus with diabetic chronic kidney disease: Secondary | ICD-10-CM | POA: Insufficient documentation

## 2022-04-11 DIAGNOSIS — Z940 Kidney transplant status: Secondary | ICD-10-CM | POA: Insufficient documentation

## 2022-04-11 DIAGNOSIS — E11319 Type 2 diabetes mellitus with unspecified diabetic retinopathy without macular edema: Secondary | ICD-10-CM | POA: Insufficient documentation

## 2022-04-11 DIAGNOSIS — E063 Autoimmune thyroiditis: Secondary | ICD-10-CM | POA: Insufficient documentation

## 2022-04-11 DIAGNOSIS — M170 Bilateral primary osteoarthritis of knee: Secondary | ICD-10-CM | POA: Diagnosis not present

## 2022-04-11 DIAGNOSIS — N2581 Secondary hyperparathyroidism of renal origin: Secondary | ICD-10-CM | POA: Diagnosis not present

## 2022-04-11 DIAGNOSIS — T8131XD Disruption of external operation (surgical) wound, not elsewhere classified, subsequent encounter: Secondary | ICD-10-CM | POA: Insufficient documentation

## 2022-04-11 DIAGNOSIS — E11622 Type 2 diabetes mellitus with other skin ulcer: Secondary | ICD-10-CM | POA: Diagnosis present

## 2022-04-11 DIAGNOSIS — N186 End stage renal disease: Secondary | ICD-10-CM | POA: Diagnosis not present

## 2022-04-11 DIAGNOSIS — H548 Legal blindness, as defined in USA: Secondary | ICD-10-CM | POA: Insufficient documentation

## 2022-04-11 DIAGNOSIS — E222 Syndrome of inappropriate secretion of antidiuretic hormone: Secondary | ICD-10-CM | POA: Insufficient documentation

## 2022-04-12 NOTE — Progress Notes (Signed)
Denise Bush, Denise Bush ([id number]) [phone].pdf Page 1 of 4 Visit Report for 04/11/2022 Abuse Risk Screen Details Patient Name: Date of Service: Denise Bush, Denise Bush 04/11/2022 8:00 A M Medical Record Number: [id number] Patient Account Number: [id number] Date of Birth/Sex: Treating RN: 1950/07/28 (71 y.o. America Brown Primary Care Jamyron Redd: Cletis Athens Other Clinician: Referring Aditya Nastasi: Treating Filimon Miranda/Extender: Clide Cliff in Treatment: 0 Abuse Risk Screen Items Answer ABUSE RISK SCREEN: Has anyone close to you tried to hurt or harm you recentlyo No Do you feel uncomfortable with anyone in your familyo No Has anyone forced you do things that you didnt want to doo No Electronic Signature(s) Signed: 04/12/2022 10:44:48 AM By: Dellie Catholic RN Entered By: Dellie Catholic on 04/11/2022 08:32:26 -------------------------------------------------------------------------------- Activities of Daily Living Details Patient Name: Date of Service: Denise Bush, Denise Bush 04/11/2022 8:00 A M Medical Record Number: [id number] Patient Account Number: [id number] Date of Birth/Sex: Treating RN: 1950/10/20 (71 y.o. America Brown Primary Care Nilah Belcourt: Cletis Athens Other Clinician: Referring Aroldo Galli: Treating Kendrick Remigio/Extender: Clide Cliff in Treatment: 0 Activities of Daily Living Items Answer Activities of Daily Living (Please select one for each item) Drive Automobile Not Able T Medications ake Need Assistance Use T elephone Need Assistance Care for Appearance Need Assistance Use T oilet Need Assistance Bath / Shower Need Assistance Dress Self Need Assistance Feed Self Need Assistance Walk Need Assistance Get In / Out Bed Need Assistance Housework Need Assistance Prepare Meals Need Assistance Handle Money Need Assistance Shop for Self Need Assistance Electronic Signature(s) Signed:  04/12/2022 10:44:48 AM By: Dellie Catholic RN Entered By: Dellie Catholic on 04/11/2022 08:33:22 Denise Bush, Denise Bush ([id number]) [id number] Page 2 of 4 -------------------------------------------------------------------------------- Education Screening Details Patient Name: Date of Service: Denise Bush, Denise Bush 04/11/2022 8:00 A M Medical Record Number: [id number] Patient Account Number: [id number] Date of Birth/Sex: Treating RN: 21-May-1950 (71 y.o. America Brown Primary Care Abygail Galeno: Cletis Athens Other Clinician: Referring Remmy Crass: Treating Mickaela Starlin/Extender: Clide Cliff in Treatment: 0 Learning Preferences/Education Level/Primary Language Learning Preference: Explanation, Demonstration, Printed Material Highest Education Level: College or Above Preferred Language: English Cognitive Barrier Language Barrier: No Translator Needed: No Memory Deficit: No Emotional Barrier: No Cultural/Religious Beliefs Affecting Medical Care: No Physical Barrier Impaired Vision: No Impaired Hearing: No Decreased Hand dexterity: No Knowledge/Comprehension Knowledge Level: High Comprehension Level: High Ability to understand written instructions: High Ability to understand verbal instructions: High Motivation Anxiety Level: Calm Cooperation: Cooperative Education Importance: Acknowledges Need Interest in Health Problems: Asks Questions Perception: Coherent Willingness to Engage in Self-Management High Activities: Readiness to Engage in Self-Management High Activities: Electronic Signature(s) Signed: 04/12/2022 10:44:48 AM By: Dellie Catholic RN Entered By: Dellie Catholic on 04/11/2022 08:34:13 -------------------------------------------------------------------------------- Fall Risk Assessment Details Patient Name: Date of Service: Denise Maxcy. 04/11/2022 8:00 A M Medical Record Number: [id number] Patient Account  Number: [id number] Date of Birth/Sex: Treating RN: 03/14/1951 (71 y.o. America Brown Primary Care Markia Kyer: Cletis Athens Other Clinician: Referring Khup Sapia: Treating Oseias Horsey/Extender: Clide Cliff in Treatment: 0 Fall Risk Assessment Items Have you had 2 or more falls in the last 12 monthso 0 No Have you had any fall that resulted in injury in the last 12 monthso 0 No Denise Bush, Denise Bush ([id number]) [phone] [id number] Page 3 of 4 FALLS RISK SCREEN History of falling - immediate or within 3 months 0 No Secondary diagnosis (Do you have 2 or more medical diagnoseso) 0 No Ambulatory  aid None/bed rest/wheelchair/nurse 0 No Crutches/cane/walker 0 No Furniture 0 No Intravenous therapy Access/Saline/Heparin Lock 0 No Gait/Transferring Normal/ bed rest/ wheelchair 0 No Weak (short steps with or without shuffle, stooped but able to lift head while walking, may seek 0 No support from furniture) Impaired (short steps with shuffle, may have difficulty arising from chair, head down, impaired 0 No balance) Mental Status Oriented to own ability 0 No Electronic Signature(s) Signed: 04/12/2022 10:44:48 AM By: Dellie Catholic RN Entered By: Dellie Catholic on 04/11/2022 08:34:20 -------------------------------------------------------------------------------- Foot Assessment Details Patient Name: Date of Service: Denise Maxcy. 04/11/2022 8:00 A M Medical Record Number: [id number] Patient Account Number: [id number] Date of Birth/Sex: Treating RN: 13-Sep-1950 (71 y.o. America Brown Primary Care Avrian Delfavero: Cletis Athens Other Clinician: Referring Nykeria Mealing: Treating Morrison Mcbryar/Extender: Clide Cliff in Treatment: 0 Foot Assessment Items Site Locations + = Sensation present, - = Sensation absent, C = Callus, U = Ulcer R = Redness, W = Warmth, M = Maceration, PU = Pre-ulcerative lesion F = Fissure, S =  Swelling, D = Dryness Assessment Right: Left: Other Deformity: No No Prior Foot Ulcer: No No Prior Amputation: No No Charcot Joint: No No Ambulatory Status: Ambulatory Without Help GaitCHRISLYNN, Denise Bush ([id number]) [id number].pdf Page 4 of 4 Electronic Signature(s) Signed: 04/12/2022 10:44:48 AM By: Dellie Catholic RN Entered By: Dellie Catholic on 04/11/2022 08:40:35 -------------------------------------------------------------------------------- Nutrition Risk Screening Details Patient Name: Date of Service: Denise Bush, Denise Bush 04/11/2022 8:00 A M Medical Record Number: [id number] Patient Account Number: [id number] Date of Birth/Sex: Treating RN: 27-Jan-1951 (71 y.o. America Brown Primary Care Seiji Wiswell: Cletis Athens Other Clinician: Referring Jacari Kirsten: Treating Ronae Noell/Extender: Claudina Lick Weeks in Treatment: 0 Height (in): Weight (lbs): Body Mass Index (BMI): Nutrition Risk Screening Items Score Screening NUTRITION RISK SCREEN: I have an illness or condition that made me change the kind and/or amount of food I eat 0 No I eat fewer than two meals per day 0 No I eat few fruits and vegetables, or milk products 0 No I have three or more drinks of beer, liquor or wine almost every day 0 No I have tooth or mouth problems that make it hard for me to eat 0 No I don't always have enough money to buy the food I need 0 No I eat alone most of the time 0 No I take three or more different prescribed or over-the-counter drugs a day 0 No Without wanting to, I have lost or gained 10 pounds in the last six months 0 No I am not always physically able to shop, cook and/or feed myself 0 No Nutrition Protocols Good Risk Protocol 0 No interventions needed Moderate Risk Protocol High Risk Proctocol Risk Level: Good Risk Score: 0 Electronic Signature(s) Signed: 04/12/2022 10:44:48 AM By: Dellie Catholic RN Entered By: Dellie Catholic on 04/11/2022 08:34:38

## 2022-04-12 NOTE — Progress Notes (Signed)
Denise Bush ([id number]) [id number] Page 1 of 7 Visit Report for 04/11/2022 Allergy List Details Patient Name: Date of Service: Denise Bush, Denise Bush 04/11/2022 8:00 A M Medical Record Number: [id number] Patient Account Number: [id number] Date of Birth/Sex: Treating RN: 27-Jun-1950 (71 y.o. America Brown Primary Care Bristol Osentoski: Cletis [place] Other Clinician: Referring Akiko Schexnider: Treating Kanyah Matsushima/Extender: Claudina Lick Weeks in Treatment: 0 Allergies Active Allergies hydralazine HCl Azor Severity: Severe Green Tea (Camellia Sinensis) denaverine diphenhydramine HCl labetalol hydrochlorothiazide Laureth 10 lisinopril metformin metoclopramide prednisone minoxidil spironolactone Statins Type: Medication Allergy Notes Electronic Signature(s) Signed: 04/12/2022 10:44:48 AM By: Dellie Catholic RN Entered By: Dellie Catholic on 04/11/2022 08:20:22 -------------------------------------------------------------------------------- Arrival Information Details Patient Name: Date of Service: Denise Maxcy. 04/11/2022 8:00 A M Medical Record Number: [id number] Patient Account Number: [id number] Date of Birth/Sex: Treating RN: 1950/10/22 (71 y.o. America Brown Primary Care Zebediah Beezley: Cletis [place] Other Clinician: Referring Annalaya Wile: Treating Nandita Mathenia/Extender: Taaliyah, Delpri[place], Jomarie Longs ([id number]) [id number] Page 2 of 7 Weeks in Treatment: 0 Visit Information Patient Arrived: Ambulatory Arrival Time: 08:26 Accompanied By: spouse Transfer Assistance: None Patient Identification Verified: Yes Electronic Signature(s) Signed: 04/12/2022 10:44:48 AM By: Dellie Catholic RN Entered By: Dellie Catholic on 04/11/2022 09:10:11 -------------------------------------------------------------------------------- Encounter Discharge Information Details Patient Name: Date of  Service: Denise Maxcy. 04/11/2022 8:00 A M Medical Record Number: [id number] Patient Account Number: [id number] Date of Birth/Sex: Treating RN: Sep 02, 1950 (71 y.o. America Brown Primary Care Jago Carton: Cletis [place] Other Clinician: Referring Devone Bonilla: Treating Bron Snellings/Extender: Clide Cliff in Treatment: 0 Encounter Discharge Information Items Post Procedure Vitals Discharge Condition: Stable Temperature (F): 98.4 Ambulatory Status: Ambulatory Pulse (bpm): 68 Discharge Destination: Home Respiratory Rate (breaths/min): 16 Transportation: Private Auto Blood Pressure (mmHg): 166/72 Accompanied By: spouse Schedule Follow-up Appointment: Yes Clinical Summary of Care: Patient Declined Electronic Signature(s) Signed: 04/12/2022 10:44:48 AM By: Dellie Catholic RN Entered By: Dellie Catholic on 04/12/2022 10:41:48 -------------------------------------------------------------------------------- Lower Extremity Assessment Details Patient Name: Date of Service: Denise Bush 04/11/2022 8:00 A M Medical Record Number: [id number] Patient Account Number: [id number] Date of Birth/Sex: Treating RN: 01-01-1951 (71 y.o. America Brown Primary Care Shulamit Donofrio: Cletis [place] Other Clinician: Referring Sanah Kraska: Treating Nirvi Boehler/Extender: Claudina Lick Weeks in Treatment: 0 Edema Assessment Assessed: [Left: No] [Right: No] [Left: Edema] [Right: :] Calf Left: Right: Point of Measurement: From Medial Instep 28.5 cm Ankle Left: Right: Point of Measurement: From Medial Instep 18 cm Denise Bush, Denise Bush ([id number]) [id number] Page 3 of 7 Vascular Assessment Pulses: Dorsalis Pedis Palpable: [Right:Yes] Blood Pressure: Brachial: [Right:166] Ankle: [Right:Dorsalis Pedis: 192 1.16] Electronic Signature(s) Signed: 04/12/2022 10:44:48 AM By: Dellie Catholic RN Entered By: Dellie Catholic on 04/11/2022  08:50:06 -------------------------------------------------------------------------------- Multi Wound Chart Details Patient Name: Date of Service: Denise Maxcy. 04/11/2022 8:00 A M Medical Record Number: [id number] Patient Account Number: [id number] Date of Birth/Sex: Treating RN: [birth year] (71 y.o. F) Primary Care Florian Chauca: Cletis [place] Other Clinician: Referring Shneur Whitten[place]: Treating Tanor Glaspy/Extender: Clide Cliff in Treatment: 0 Vital Signs Height(in): 65 Pulse(bpm): 55 Weight(lbs): 165 Blood Pressure(mmHg): 166/72 Body Mass Index(BMI): 27.5 Temperature(F): 98.4 Respiratory Rate(breaths/min): 16 [1:Photos:] [N/A:N/A] Right Knee N/A N/A Wound Location: Surgical Injury N/A N/A Wounding Event: Dehisced Wound N/A N/A Primary Etiology: Cataracts, End Stage Renal Disease, N/A N/A Comorbid History: Osteoarthritis 01/24/2022 N/A N/A Date Acquired: 0 N/A N/A Weeks of Treatment: Open N/A N/A Wound Status: No N/A N/A Wound Recurrence: 6x1.3x0.4 N/A N/A Measurements L  x W x D (cm) 6.126 N/A N/A A (cm) : rea 2.45 N/A N/A Volume (cm) : Full Thickness Without Exposed N/A N/A Classification: Support Structures Medium N/A N/A Exudate A mount: Serosanguineous N/A N/A Exudate Type: red, brown N/A N/A Exudate Color: Distinct, outline attached N/A N/A Wound Margin: Small (1-33%) N/A N/A Granulation A mount: Pink N/A N/A Granulation Quality: Large (67-100%) N/A N/A Necrotic A mount: Eschar, Adherent Slough N/A N/A Necrotic Tissue: Fat Layer (Subcutaneous Tissue): Yes N/A N/A Exposed Structures: None N/A N/A Epithelialization: Debridement - Excisional N/A N/A Debridement: Pre-procedure Verification/Time Out 09:13 N/A N/A Taken: Denise Bush, Denise Bush ([id number]) [id number] Page 4 of 7 Lidocaine 5% topical ointment N/A N/A Pain Control: Necrotic/Eschar, Subcutaneous N/A N/A Tissue Debrided: Skin/Subcutaneous  Tissue N/A N/A Level: 7.8 N/A N/A Debridement A (sq cm): rea Curette N/A N/A Instrument: Minimum N/A N/A Bleeding: Pressure N/A N/A Hemostasis A chieved: 0 N/A N/A Procedural Pain: 0 N/A N/A Post Procedural Pain: Procedure was tolerated well N/A N/A Debridement Treatment Response: 6x1.3x0.4 N/A N/A Post Debridement Measurements L x W x D (cm) 2.45 N/A N/A Post Debridement Volume: (cm) Scarring: Yes N/A N/A Periwound Skin Texture: No Abnormalities Noted N/A N/A Periwound Skin Moisture: No Abnormalities Noted N/A N/A Periwound Skin Color: No Abnormality N/A N/A Temperature: Debridement N/A N/A Procedures Performed: Treatment Notes Electronic Signature(s) Signed: 04/11/2022 3:46:52 PM By: Linton Ham MD Entered By: Linton Ham on 04/11/2022 09:23:40 -------------------------------------------------------------------------------- Multi-Disciplinary Care Plan Details Patient Name: Date of Service: Denise Maxcy. 04/11/2022 8:00 A M Medical Record Number: [id number] Patient Account Number: [id number] Date of Birth/Sex: Treating RN: [birth year] (71 y.o. America Brown Primary Care Alecia Doi: Cletis [place] Other Clinician: Referring Owain Eckerman: Treating Evetta Renner/Extender: Clide Cliff in Treatment: 0 Active Inactive Wound/Skin Impairment Nursing Diagnoses: Impaired tissue integrity Goals: Patient/caregiver will verbalize understanding of skin care regimen Date Initiated: 04/11/2022 Target Resolution Date: 06/29/2022 Goal Status: Active Interventions: Assess ulceration(s) every visit Treatment Activities: Skin care regimen initiated : 04/11/2022 Notes: Electronic Signature(s) Signed: 04/12/2022 10:44:48 AM By: Dellie Catholic RN Entered By: Dellie Catholic on 04/11/2022 10:14:12 Denise Bush ([id number]) [id number] Page 5 of  7 -------------------------------------------------------------------------------- Pain Assessment Details Patient Name: Date of Service: Denise Bush, Denise Bush 04/11/2022 8:00 A M Medical Record Number: [id number] Patient Account Number: [id number] Date of Birth/Sex: Treating RN: 10-25-1950 (71 y.o. America Brown Primary Care Ahni Bradwell: Cletis [place] Other Clinician: Referring Notnamed Croucher: Treating Egan Berkheimer/Extender: Clide Cliff in Treatment: 0 Active Problems Location of Pain Severity and Description of Pain Patient Has Paino No Site Locations Pain Management and Medication Current Pain Management: Electronic Signature(s) Signed: 04/12/2022 10:44:48 AM By: Dellie Catholic RN Entered By: Dellie Catholic on 04/11/2022 08:52:16 -------------------------------------------------------------------------------- Patient/Caregiver Education Details Patient Name: Date of Service: Denise Maxcy 12/12/2023andnbsp8:00 A M Medical Record Number: [id number] Patient Account Number: [id number] Date of Birth/Gender: Treating RN: 13-Nov-1950 (71 y.o. America Brown Primary Care Physician: Cletis [place] Other Clinician: Referring Physician: Treating Physician/Extender: Clide Cliff in Treatment: 0 Education Assessment Education Provided To: Patient Education Topics Provided Wound/Skin Impairment: Methods: Explain/Verbal Responses: Return demonstration correctly Electronic Signature(s) Signed: 04/12/2022 10:44:48 AM By: Dellie Catholic RN Entered By: Dellie Catholic on 04/11/2022 10:14:21 Denise Bush, Denise Bush ([id number]) [id number] Page 6 of 7 -------------------------------------------------------------------------------- Wound Assessment Details Patient Name: Date of Service: Denise Bush, Denise Bush 04/11/2022 8:00 A M Medical Record Number: [id number] Patient Account Number: [id number] Date of Birth/Sex: Treating  RN: 11-28-1950 (71 y.o. F)  Dellie Catholic Primary Care Anacaren Kohan: Cletis [place] Other Clinician: Referring Yosselin Zoeller: Treating Aleksa Collinsworth/Extender: Claudina Lick Weeks in Treatment: 0 Wound Status Wound Number: 1 Primary Etiology: Dehisced Wound Wound Location: Right Knee Wound Status: Open Wounding Event: Surgical Injury Comorbid History: Cataracts, End Stage Renal Disease, Osteoarthritis Date Acquired: 01/24/2022 Weeks Of Treatment: 0 Clustered Wound: No Photos Wound Measurements Length: (cm) 6 Width: (cm) 1.3 Depth: (cm) 0.4 Area: (cm) 6.126 Volume: (cm) 2.45 % Reduction in Area: % Reduction in Volume: Epithelialization: None Tunneling: No Undermining: No Wound Description Classification: Full Thickness Without Exposed Su Wound Margin: Distinct, outline attached Exudate Amount: Medium Exudate Type: Serosanguineous Exudate Color: red, brown pport Structures Wound Bed Granulation Amount: Small (1-33%) Exposed Structure Granulation Quality: Pink Fat Layer (Subcutaneous Tissue) Exposed: Yes Necrotic Amount: Large (67-100%) Necrotic Quality: Eschar, Adherent Slough Periwound Skin Texture Texture Color No Abnormalities Noted: No No Abnormalities Noted: Yes Scarring: Yes Temperature / Pain Temperature: No Abnormality Moisture No Abnormalities Noted: Yes Treatment Notes Wound #1 (Knee) Wound Laterality: Right Cleanser Soap and Water Discharge Instruction: May shower and wash wound with dial antibacterial soap and water prior to dressing change. Denise Bush, Denise Bush ([id number]) [id number] Page 7 of 7 Wound Cleanser Discharge Instruction: Cleanse the wound with wound cleanser prior to applying a clean dressing using gauze sponges, not tissue or cotton balls. Peri-Wound Care Topical Primary Dressing Hydrofera Blue Ready Transfer Foam, 4x5 (in/in) Discharge Instruction: Apply to wound bed as instructed Secondary  Dressing Bordered Gauze, 4x4 in Discharge Instruction: Apply over primary dressing as directed. Secured With Compression Wrap Compression Stockings Environmental education officer) Signed: 04/12/2022 10:44:48 AM By: Dellie Catholic RN Entered By: Dellie Catholic on 04/11/2022 08:54:37 -------------------------------------------------------------------------------- Vitals Details Patient Name: Date of Service: Denise Maxcy. 04/11/2022 8:00 A M Medical Record Number: [id number] Patient Account Number: [id number] Date of Birth/Sex: Treating RN: Mar 03, 1951 (71 y.o. America Brown Primary Care Kern Gingras: Cletis [place] Other Clinician: Referring Sulaiman Imbert: Treating Kasper Mudrick/Extender: Clide Cliff in Treatment: 0 Vital Signs Time Taken: 08:27 Temperature (F): 98.4 Height (in): 65 Pulse (bpm): 68 Weight (lbs): 165 Respiratory Rate (breaths/min): 16 Body Mass Index (BMI): 27.5 Blood Pressure (mmHg): 166/72 Reference Range: 80 - 120 mg / dl Electronic Signature(s) Signed: 04/12/2022 10:44:48 AM By: Dellie Catholic RN Entered By: Dellie Catholic on 04/11/2022 08:42:28

## 2022-04-12 NOTE — Progress Notes (Addendum)
Denise Bush ([id number]) [id number] Page 1 of 8 Visit Report for 04/11/2022 Chief Complaint Document Details Patient Name: Date of Service: Denise Bush 04/11/2022 8:00 A M Medical Record Number: [id number] Patient Account Number: [id number] Date of Birth/Sex: Treating RN: 18-Feb-1951 (71 y.o. F) Primary Care Provider: Cletis Athens Other Clinician: Referring Provider: Treating Provider/Extender: Clide Cliff in Treatment: 0 Information Obtained from: Patient Chief Complaint 04/11/2022; patient is here for a nonhealing surgical wound on her right knee Electronic Signature(s) Signed: 04/11/2022 3:46:52 PM By: Linton Ham MD Entered By: Linton Ham on 04/11/2022 09:25:07 -------------------------------------------------------------------------------- Debridement Details Patient Name: Date of Service: Denise Bush. 04/11/2022 8:00 A M Medical Record Number: [id number] Patient Account Number: [id number] Date of Birth/Sex: Treating RN: Oct 31, 1950 (71 y.o. F) Primary Care Provider: Cletis Athens Other Clinician: Referring Provider: Treating Provider/Extender: Clide Cliff in Treatment: 0 Debridement Performed for Assessment: Wound #1 Right Knee Performed By: Physician Denise Bush., MD Debridement Type: Debridement Level of Consciousness (Pre-procedure): Awake and Alert Pre-procedure Verification/Time Out Yes - 09:13 Taken: Start Time: 09:13 Pain Control: Lidocaine 5% topical ointment T Area Debrided (L x W): otal 6 (cm) x 1.3 (cm) = 7.8 (cm) Tissue and other material debrided: Non-Viable, Eschar, Subcutaneous Level: Skin/Subcutaneous Tissue Debridement Description: Excisional Instrument: Curette Bleeding: Minimum Hemostasis Achieved: Pressure End Time: 09:14 Procedural Pain: 0 Post Procedural Pain: 0 Response to Treatment: Procedure was tolerated well Level of  Consciousness (Post- Awake and Alert procedure): Post Debridement Measurements of Total Wound Length: (cm) 6 Width: (cm) 1.3 Depth: (cm) 0.4 Volume: (cm) 2.45 Character of Wound/Ulcer Post Debridement: Improved Post Procedure Diagnosis Denise, Bush ([id number]) [id number] Page 2 of 8 Same as Pre-procedure Notes Scribed for Dr. Dellia Nims by J.Scotton Electronic Signature(s) Signed: 04/11/2022 3:46:52 PM By: Linton Ham MD Entered By: Linton Ham on 04/11/2022 09:23:47 -------------------------------------------------------------------------------- HPI Details Patient Name: Date of Service: Denise Bush. 04/11/2022 8:00 A M Medical Record Number: [id number] Patient Account Number: [id number] Date of Birth/Sex: Treating RN: 1950/06/15 (71 y.o. F) Primary Care Provider: Cletis Athens Other Clinician: Referring Provider: Treating Provider/Extender: Clide Cliff in Treatment: 0 History of Present Illness HPI Description: ADMISSION 04/11/2022 This is a pleasant 71 year old woman accompanied by her husband. She has a history of type 2 diabetes reasonably well-controlled status post kidney transplant in 2019. She had a right total knee replacement towards the end of September of this year. According to the patient this never really healed they used surgical glue for adhesion that was not sutured. She simply says it never closed. This has been followed by orthopedic surgery and they have referred her here apparently she has been using Vaseline gauze. The wound is 6 x 1.3 x 0.4 and completely eschar covered Past medical history includes type 2 diabetes with a kidney transplant in 2019. She had a right total knee replacement on 01/21/2022. She has a history of SIADH, hypothyroidism she is legally blind. ABI in our clinic was 1.16 Electronic Signature(s) Signed: 04/11/2022 3:46:52 PM By: Linton Ham MD Entered By:  Linton Ham on 04/11/2022 09:36:43 -------------------------------------------------------------------------------- Physical Exam Details Patient Name: Date of Service: Denise Bush 04/11/2022 8:00 A M Medical Record Number: [id number] Patient Account Number: [id number] Date of Birth/Sex: Treating RN: 1951-04-23 (71 y.o. F) Primary Care Provider: Cletis Athens Other Clinician: Referring Provider: Treating Provider/Extender: Clide Cliff in Treatment: 0 Constitutional Patient is hypertensive.. Pulse regular and within target  range for patient.Marland Kitchen Respirations regular, non-labored and within target range.. Temperature is normal and within the target range for the patient.Marland Kitchen Appears in no distress. Cardiovascular Pedal pulses are easily palpable in the right foot. Notes Wound exam; right total knee. Mid part of the surgical incision is open. On arrival in clinic this was totally covered in necrotic debris on the wound surface and thick black eschar around all of the circumference and covering the distal 50% of the wound. I used a #5 curette to remove the eschar and the subcutaneous debris on the wound surface. I was able to get to a fairly healthy looking granulated wound which is gratifying to see. There was no evidence of infection no probing depth. This certainly does not communicate with the joint Denise, Bush ([id number]) [phone].pdf Page 3 of 8 Electronic Signature(s) Signed: 04/11/2022 3:46:52 PM By: Linton Ham MD Entered By: Linton Ham on 04/11/2022 09:38:06 -------------------------------------------------------------------------------- Physician Orders Details Patient Name: Date of Service: Denise Bush 04/11/2022 8:00 A M Medical Record Number: [id number] Patient Account Number: [id number] Date of Birth/Sex: Treating RN: 07-23-1950 (71 y.o. America Brown Primary Care Provider: Cletis Athens  Other Clinician: Referring Provider: Treating Provider/Extender: Clide Cliff in Treatment: 0 Verbal / Phone Orders: No Diagnosis Coding Follow-up Appointments ppointment in 1 week. - Dr. Reyes Ivan Room 3 Return A Anesthetic (In clinic) Topical Lidocaine 5% applied to wound bed - Used in Clinic Bathing/ Shower/ Hygiene Other Bathing/Shower/Hygiene Orders/Instructions: - May change wound every other day, if so desired, after bathing. Additional Orders / Instructions Other: - Do Not put pressure on the right knee Home Health Wound #1 Right Knee Admit to Home Health for skilled nursing wound care. May utilize formulary equivalent dressing for wound treatment orders unless otherwise specified. - Referral for [place] Orders/Instructions: - [place] to perform wound care 1 x week for 9 weeks Wound Treatment Wound #1 - Knee Wound Laterality: Right Cleanser: Soap and Water Every Other Day/30 Days Discharge Instructions: May shower and wash wound with dial antibacterial soap and water prior to dressing change. Cleanser: Wound Cleanser (Generic) Every Other Day/30 Days Discharge Instructions: Cleanse the wound with wound cleanser prior to applying a clean dressing using gauze sponges, not tissue or cotton balls. Prim Dressing: Hydrofera Blue Ready Transfer Foam, 4x5 (in/in) (DME) (Generic) Every Other Day/30 Days ary Discharge Instructions: Apply to wound bed as instructed Secondary Dressing: Bordered Gauze, 4x4 in (DME) (Generic) Every Other Day/30 Days Discharge Instructions: Apply over primary dressing as directed. Electronic Signature(s) Signed: 04/17/2022 11:40:40 AM By: Dellie Catholic RN Signed: 04/17/2022 5:06:55 PM By: Linton Ham MD Previous Signature: 04/12/2022 7:44:25 AM Version By: Linton Ham MD Previous Signature: 04/12/2022 10:44:48 AM Version By: Dellie Catholic RN Previous Signature: 04/11/2022  3:46:52 PM Version By: Linton Ham MD Entered By: Dellie Catholic on 04/17/2022 09:42:07 Problem List Details -------------------------------------------------------------------------------- Denise Bush ([id number]) [id number] Page 4 of 8 Patient Name: Date of Service: Denise, Bush 04/11/2022 8:00 A M Medical Record Number: [id number] Patient Account Number: [id number] Date of Birth/Sex: Treating RN: [birth year] (71 y.o. F) Primary Care Provider: Cletis Athens Other Clinician: Referring Provider: Treating Provider/Extender: Clide Cliff in Treatment: 0 Active Problems ICD-10 Encounter Code Description Active Date MDM Diagnosis T81.31XD Disruption of external operation (surgical) wound, not elsewhere classified, 04/11/2022 No Yes subsequent encounter S81.001D Unspecified open wound, right knee, subsequent encounter 04/11/2022 No Yes E11.622 Type 2 diabetes mellitus  with other skin ulcer 04/11/2022 No Yes Z94.0 Kidney transplant status 04/11/2022 No Yes Inactive Problems Resolved Problems Electronic Signature(s) Signed: 04/11/2022 3:46:52 PM By: Linton Ham MD Entered By: Linton Ham on 04/11/2022 09:23:23 -------------------------------------------------------------------------------- Progress Note Details Patient Name: Date of Service: Denise Bush. 04/11/2022 8:00 A M Medical Record Number: [id number] Patient Account Number: [id number] Date of Birth/Sex: Treating RN: 08-14-1950 (71 y.o. F) Primary Care Provider: Cletis Athens Other Clinician: Referring Provider: Treating Provider/Extender: Clide Cliff in Treatment: 0 Subjective Chief Complaint Information obtained from Patient 04/11/2022; patient is here for a nonhealing surgical wound on her right knee History of Present Illness (HPI) ADMISSION 04/11/2022 This is a pleasant 71 year old woman accompanied by her  husband. She has a history of type 2 diabetes reasonably well-controlled status post kidney transplant in 2019. She had a right total knee replacement towards the end of September of this year. According to the patient this never really healed they used surgical glue for adhesion that was not sutured. She simply says it never closed. This has been followed by orthopedic surgery and they have referred her here apparently she has been using Vaseline gauze. The wound is 6 x 1.3 x 0.4 and completely eschar covered Past medical history includes type 2 diabetes with a kidney transplant in 2019. She had a right total knee replacement on 01/21/2022. She has a history of SIADH, hypothyroidism she is legally blind. ABI in our clinic was 1.16 Denise, Bush ([id number]) [id number] Page 5 of 8 Patient History Information obtained from Patient. Allergies hydralazine HCl, Azor (Severity: Severe), Green T (Camellia Sinensis), denaverine, diphenhydramine HCl, labetalol, hydrochlorothiazide, Laureth 10, lisinopril, ea metformin, metoclopramide, prednisone, minoxidil, spironolactone, Statins Family History Unknown History. Social History Never smoker, Marital Status - Married, Alcohol Use - Never, Drug Use - No History, Caffeine Use - Rarely. Medical History Eyes Patient has history of Cataracts Genitourinary Patient has history of End Stage Renal Disease - Was On Dialysis Musculoskeletal Patient has history of Osteoarthritis - Knees Hospitalization/Surgery History - Right T knee arthroplasty. otal Medical A Surgical History Notes nd Eyes Hx: Diabetic Retinopathy Hematologic/Lymphatic Hx: Blood Transfusions Cardiovascular Hx: Hashimoto's disease Endocrine Hx: Hyperparathyroidism secondary to renal;Hypothyroidism Diabetes Mellitus Type 2 (35 years) Genitourinary Hx: Of Right Kidney transplant General Notes: AV Fistula Left arm Objective Constitutional Patient is  hypertensive.. Pulse regular and within target range for patient.Marland Kitchen Respirations regular, non-labored and within target range.. Temperature is normal and within the target range for the patient.Marland Kitchen Appears in no distress. Vitals Time Taken: 8:27 AM, Height: 65 in, Weight: 165 lbs, BMI: 27.5, Temperature: 98.4 F, Pulse: 68 bpm, Respiratory Rate: 16 breaths/min, Blood Pressure: 166/72 mmHg. Cardiovascular Pedal pulses are easily palpable in the right foot. General Notes: Wound exam; right total knee. Mid part of the surgical incision is open. On arrival in clinic this was totally covered in necrotic debris on the wound surface and thick black eschar around all of the circumference and covering the distal 50% of the wound. I used a #5 curette to remove the eschar and the subcutaneous debris on the wound surface. I was able to get to a fairly healthy looking granulated wound which is gratifying to see. There was no evidence of infection no probing depth. This certainly does not communicate with the joint Integumentary (Hair, Skin) Wound #1 status is Open. Original cause of wound was Surgical Injury. The date acquired was: 01/24/2022. The wound is located on the Right Knee. The wound measures 6cm  length x 1.3cm width x 0.4cm depth; 6.126cm^2 area and 2.45cm^3 volume. There is Fat Layer (Subcutaneous Tissue) exposed. There is no tunneling or undermining noted. There is a medium amount of serosanguineous drainage noted. The wound margin is distinct with the outline attached to the wound base. There is small (1-33%) pink granulation within the wound bed. There is a large (67-100%) amount of necrotic tissue within the wound bed including Eschar and Adherent Slough. The periwound skin appearance had no abnormalities noted for moisture. The periwound skin appearance had no abnormalities noted for color. The periwound skin appearance exhibited: Scarring. Periwound temperature was noted as No  Abnormality. Assessment Active Problems ICD-10 Disruption of external operation (surgical) wound, not elsewhere classified, subsequent encounter Unspecified open wound, right knee, subsequent encounter Type 2 diabetes mellitus with other skin ulcer Kidney transplant status Denise, Bush ([id number]) [id number] Page 6 of 8 Procedures Wound #1 Pre-procedure diagnosis of Wound #1 is a Dehisced Wound located on the Right Knee . There was a Excisional Skin/Subcutaneous Tissue Debridement with a total area of 7.8 sq cm performed by Denise Bush., MD. With the following instrument(s): Curette to remove Non-Viable tissue/material. Material removed includes Eschar and Subcutaneous Tissue and after achieving pain control using Lidocaine 5% topical ointment. No specimens were taken. A time out was conducted at 09:13, prior to the start of the procedure. A Minimum amount of bleeding was controlled with Pressure. The procedure was tolerated well with a pain level of 0 throughout and a pain level of 0 following the procedure. Post Debridement Measurements: 6cm length x 1.3cm width x 0.4cm depth; 2.45cm^3 volume. Character of Wound/Ulcer Post Debridement is improved. Post procedure Diagnosis Wound #1: Same as Pre-Procedure General Notes: Scribed for Dr. Dellia Nims by J.Scotton. Plan Follow-up Appointments: Return Appointment in 1 week. - Dr. Reyes Ivan Room 3 Anesthetic: (In clinic) Topical Lidocaine 5% applied to wound bed - Used in Clinic Bathing/ Shower/ Hygiene: Other Bathing/Shower/Hygiene Orders/Instructions: - May change wound every other day, if so desired, after bathing. Additional Orders / Instructions: Other: - Do Not put pressure on the right knee Home Health: Wound #1 Right Knee: Admit to Home Health for skilled nursing wound care. May utilize formulary equivalent dressing for wound treatment orders unless otherwise specified. - Referral for [facility] Orders/Instructions: - [place] to perform wound care 1 x week for 9 weeks WOUND #1: - Knee Wound Laterality: Right Cleanser: Soap and Water Every Other Day/30 Days Discharge Instructions: May shower and wash wound with dial antibacterial soap and water prior to dressing change. Cleanser: Wound Cleanser (Generic) Every Other Day/30 Days Discharge Instructions: Cleanse the wound with wound cleanser prior to applying a clean dressing using gauze sponges, not tissue or cotton balls. Prim Dressing: Hydrofera Blue Ready Transfer Foam, 4x5 (in/in) (DME) (Generic) Every Other Day/30 Days ary Discharge Instructions: Apply to wound bed as instructed Secondary Dressing: Bordered Gauze, 4x4 in (DME) (Generic) Every Other Day/30 Days Discharge Instructions: Apply over primary dressing as directed. 1. Surgical wound nonhealing. Debridement was necessary today. Postdebridement a reasonably healthy looking granulation. Will use Hydrofera Blue as the primary dressing change every 2 2. She should be eligible for home health which we will try to arrange 3. No evidence of infection here 4. The patient is legally blind but we showed her husband how to do the dressing pending home health 5. We will see her back next week in follow-up. 6. No evidence of a arterial issue here.  Her type 2 diabetes is fairly well-controlled with her last hemoglobin A1c of 6.8 7 there have been adjustments to her rejection medications she is currently on mycophenolate mofetil but that was not her usual medication which apparently is more likely to inhibit wound healing Electronic Signature(s) Signed: 04/17/2022 11:40:40 AM By: Dellie Catholic RN Signed: 04/17/2022 5:06:55 PM By: Linton Ham MD Previous Signature: 04/11/2022 3:46:52 PM Version By: Linton Ham MD Entered By: Dellie Catholic on 04/17/2022  09:42:27 -------------------------------------------------------------------------------- HxROS Details Patient Name: Date of Service: Denise Bush. 04/11/2022 8:00 A M Medical Record Number: [id number] Patient Account Number: [id number] Date of Birth/Sex: Treating RN: 08-03-1950 (71 y.o. 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;Hypothyroidism Diabetes Mellitus Type 2 (35 years) Genitourinary Medical History: Positive for: End Stage Renal Disease - Was On Dialysis Past Medical History Notes: Hx: Of Right Kidney transplant Musculoskeletal Medical History: Positive for: Osteoarthritis - Knees Oncologic HBO Extended History Items Eyes: Cataracts Immunizations Pneumococcal Vaccine: Received Pneumococcal Vaccination: Yes Received Pneumococcal Vaccination On or After 60th Birthday: Yes Implantable Devices Yes Hospitalization / Surgery History Type of Hospitalization/Surgery Right T knee arthroplasty otal Family and Social History Unknown History: Yes; Never smoker; Marital Status - Married; Alcohol Use: Never; Drug Use: No History; Caffeine Use: Rarely; Financial Concerns: No; Food, Clothing or Shelter Needs: No; Support System Lacking: No; Transportation Concerns: No Notes AV Fistula Left arm Electronic Signature(s) Signed: 04/11/2022 3:46:52 PM  By: Linton Ham MD Signed: 04/12/2022 10:44:48 AM By: Dellie Catholic RN Entered By: Dellie Catholic on 04/11/2022 08:32:18 Denise, Bush ([id number]) [id number] Page 8 of 8 -------------------------------------------------------------------------------- SuperBill Details Patient Name: Date of Service: Denise, Bush 04/11/2022 Medical Record Number: [id number] Patient Account Number: [id number] Date of Birth/Sex: Treating RN: Aug 20, 1950 (71 y.o. F) Primary Care Provider: Cletis Athens Other Clinician: Referring Provider: Treating Provider/Extender: Clide Cliff in Treatment: 0 Diagnosis Coding ICD-10 Codes Code Description T81.31XD Disruption of external operation (surgical) wound, not elsewhere classified, subsequent encounter S81.001D Unspecified open wound, right knee, subsequent encounter E11.622 Type 2 diabetes mellitus with other skin ulcer Z94.0 Kidney transplant status Facility Procedures : CPT4 Code: [id number] Description: 54650 - DEB SUBQ TISSUE 20 SQ CM/< ICD-10 Diagnosis Description T81.31XD Disruption of external operation (surgical) wound, not elsewhere classified, subs Modifier: equent encounter Quantity: 1 Physician Procedures : CPT4 Code Description Modifier [id number] 12751 - WC PHYS LEVEL 4 - NEW PT 25 ICD-10 Diagnosis Description T81.31XD Disruption of external operation (surgical) wound, not elsewhere classified, subsequent encounter S81.001D Unspecified open wound, right  knee, subsequent encounter E11.622 Type 2 diabetes mellitus with other skin ulcer Z94.0 Kidney transplant status Quantity: 1 : [id number] - WC PHYS SUBQ TISS 20 SQ CM ICD-10 Diagnosis Description T81.31XD Disruption of external operation (surgical) wound, not elsewhere classified, subsequent encounter Quantity: 1 Electronic Signature(s) Signed: 04/11/2022 3:46:52 PM By: Linton Ham MD Entered By: Linton Ham on  04/11/2022 09:40:02

## 2022-04-18 ENCOUNTER — Encounter (HOSPITAL_BASED_OUTPATIENT_CLINIC_OR_DEPARTMENT_OTHER): Payer: Medicare PPO | Admitting: General Surgery

## 2022-04-18 DIAGNOSIS — E11622 Type 2 diabetes mellitus with other skin ulcer: Secondary | ICD-10-CM | POA: Diagnosis not present

## 2022-04-18 NOTE — Progress Notes (Signed)
CARMON, BRIGANDI ([id number]) [id number] Page 1 of 7 Visit Report for 04/18/2022 Arrival Information Details Patient Name: Date of Service: Denise Bush, Denise Bush 04/18/2022 10:30 A M Medical Record Number: [id number] Patient Account Number: [id number] Date of Birth/Sex: Treating RN: 08-May-1950 (71 y.o. F) Primary Care Lakaisha Danish: Cletis [place] Other Clinician: Referring Myleigh Amara: Treating Marcelene Weidemann/Extender: Colvin Caroli in Treatment: 1 Visit Information History Since Last Visit All ordered tests and consults were completed: No Patient Arrived: Ambulatory Added or deleted any medications: No Arrival Time: 10:33 Any new allergies or adverse reactions: No Accompanied By: husband Had a fall or experienced change in No Transfer Assistance: None activities of daily living that may affect Patient Identification Verified: Yes risk of falls: Secondary Verification Process Completed: Yes Signs or symptoms of abuse/neglect since last visito No Hospitalized since last visit: No Implantable device outside of the clinic excluding No cellular tissue based products placed in the center since last visit: Pain Present Now: No Electronic Signature(s) Signed: 04/18/2022 10:56:05 AM By: Worthy Rancher Entered By: Worthy Rancher on 04/18/2022 10:33:56 -------------------------------------------------------------------------------- Encounter Discharge Information Details Patient Name: Date of Service: Denise Bush. 04/18/2022 10:30 A M Medical Record Number: [id number] Patient Account Number: [id number] Date of Birth/Sex: Treating RN: 18-Apr-1951 (71 y.o. F) Primary Care Teagan Heidrick: Cletis [place] Other Clinician: Referring Belissa Kooy: Treating Mrytle Bento/Extender: Colvin Caroli in Treatment: 1 Encounter Discharge Information Items Post Procedure Vitals Discharge Condition: Stable Temperature (F): 98.6 Ambulatory Status:  Ambulatory Pulse (bpm): 98 Discharge Destination: Home Respiratory Rate (breaths/min): 20 Transportation: Private Auto Blood Pressure (mmHg): 155/64 Accompanied By: spouse Schedule Follow-up Appointment: Yes Clinical Summary of Care: Electronic Signature(s) Signed: 04/18/2022 11:19:43 AM By: Worthy Rancher Entered By: Worthy Rancher on 04/18/2022 11:04:31 Denise Bush ([id number]) [id number] Page 2 of 7 -------------------------------------------------------------------------------- Lower Extremity Assessment Details Patient Name: Date of Service: Denise Bush, Denise Bush 04/18/2022 10:30 A M Medical Record Number: [id number] Patient Account Number: [id number] Date of Birth/Sex: Treating RN: Dec 02, 1950 (71 y.o. F) Primary Care Anntionette Madkins: Cletis [place] Other Clinician: Referring Shawndra Clute: Treating Nao Linz/Extender: Colvin Caroli in Treatment: 1 Edema Assessment Assessed: [Left: No] [Right: No] [Left: Edema] [Right: :] Calf Left: Right: Point of Measurement: From Medial Instep 28.5 cm Ankle Left: Right: Point of Measurement: From Medial Instep 18 cm Vascular Assessment Pulses: Dorsalis Pedis Palpable: [Right:Yes] Electronic Signature(s) Signed: 04/18/2022 10:56:05 AM By: Worthy Rancher Entered By: Worthy Rancher on 04/18/2022 10:52:07 -------------------------------------------------------------------------------- Multi Wound Chart Details Patient Name: Date of Service: Denise Bush. 04/18/2022 10:30 A M Medical Record Number: [id number] Patient Account Number: [id number] Date of Birth/Sex: Treating RN: 04/10/1951 (71 y.o. F) Primary Care Shukri Nistler: Cletis [place] Other Clinician: Referring Tamee Battin: Treating Kennisha Qin/Extender: Colvin Caroli in Treatment: 1 Vital Signs Height(in): 65 Capillary Blood Glucose(mg/dl): 170 Weight(lbs): 165 Pulse(bpm): 98 Body Mass Index(BMI): 27.5 Blood Pressure(mmHg):  155/64 Temperature(F): 98.6 Respiratory Rate(breaths/min): 20 [1:Photos:] [N/A:N/A] Right Knee N/A N/A Wound Location: Surgical Injury N/A N/A Wounding Event: Dehisced Wound N/A N/A Primary Etiology: Cataracts, End Stage Renal Disease, N/A N/A Comorbid History: Osteoarthritis 01/24/2022 N/A N/A Date Acquired: 1 N/A N/A Weeks of Treatment: Open N/A N/A Wound Status: No N/A N/A Wound Recurrence: 5x1.2x0.1 N/A N/A Measurements L x W x D (cm) 4.712 N/A N/A A (cm) : rea 0.471 N/A N/A Volume (cm) : 23.10% N/A N/A % Reduction in A rea: 80.80% N/A N/A % Reduction in Volume: Full Thickness Without Exposed N/A N/A Classification: Support Structures Medium N/A N/A Exudate A  mount: Serosanguineous N/A N/A Exudate Type: red, brown N/A N/A Exudate Color: Distinct, outline attached N/A N/A Wound Margin: Small (1-33%) N/A N/A Granulation A mount: Pink N/A N/A Granulation Quality: Large (67-100%) N/A N/A Necrotic A mount: Eschar, Adherent Slough N/A N/A Necrotic Tissue: Fat Layer (Subcutaneous Tissue): Yes N/A N/A Exposed Structures: Fascia: No Tendon: No Muscle: No Joint: No Bone: No Small (1-33%) N/A N/A Epithelialization: Debridement - Excisional N/A N/A Debridement: Pre-procedure Verification/Time Out 10:56 N/A N/A Taken: Lidocaine 5% topical ointment N/A N/A Pain Control: Necrotic/Eschar, Subcutaneous, N/A N/A Tissue Debrided: Slough Skin/Subcutaneous Tissue N/A N/A Level: 6 N/A N/A Debridement A (sq cm): rea Curette N/A N/A Instrument: Minimum N/A N/A Bleeding: Pressure N/A N/A Hemostasis Achieved: Debridement Treatment Response: Procedure was tolerated well N/A N/A Post Debridement Measurements L x 5x1.2x0.1 N/A N/A W x D (cm) 0.471 N/A N/A Post Debridement Volume: (cm) Scarring: Yes N/A N/A Periwound Skin Texture: No Abnormalities Noted N/A N/A Periwound Skin Moisture: No Abnormalities Noted N/A N/A Periwound Skin Color: No  Abnormality N/A N/A Temperature: Debridement N/A N/A Procedures Performed: Treatment Notes Wound #1 (Knee) Wound Laterality: Right Cleanser Soap and Water Discharge Instruction: May shower and wash wound with dial antibacterial soap and water prior to dressing change. Wound Cleanser Discharge Instruction: Cleanse the wound with wound cleanser prior to applying a clean dressing using gauze sponges, not tissue or cotton balls. Peri-Wound Care Topical Primary Dressing Hydrofera Blue Ready Transfer Foam, 4x5 (in/in) Discharge Instruction: Apply to wound bed as instructed Secondary Dressing Bordered Gauze, 4x4 in Discharge Instruction: Apply over primary dressing as directed. Secured With Compression Wrap Compression Stockings Add-Ons Denise Bush, Denise Bush ([id number]) [id number] Page 4 of 7 Electronic Signature(s) Signed: 04/18/2022 11:03:32 AM By: Fredirick Maudlin MD FACS Entered By: Fredirick Maudlin on 04/18/2022 11:03:31 -------------------------------------------------------------------------------- Multi-Disciplinary Care Plan Details Patient Name: Date of Service: Denise Bush. 04/18/2022 10:30 A M Medical Record Number: [id number] Patient Account Number: [id number] Date of Birth/Sex: Treating RN: 04-Oct-1950 (71 y.o. F) Primary Care Jefferey Lippmann: Cletis [place] Other Clinician: Referring Arine Foley: Treating Dorion Petillo/Extender: Colvin Caroli in Treatment: 1 Active Inactive Wound/Skin Impairment Nursing Diagnoses: Impaired tissue integrity Goals: Patient/caregiver will verbalize understanding of skin care regimen Date Initiated: 04/11/2022 Target Resolution Date: 06/29/2022 Goal Status: Active Interventions: Assess ulceration(s) every visit Treatment Activities: Skin care regimen initiated : 04/11/2022 Notes: Electronic Signature(s) Signed: 04/18/2022 11:19:43 AM By: Worthy Rancher Previous Signature: 04/18/2022 10:56:05 AM  Version By: Worthy Rancher Entered By: Worthy Rancher on 04/18/2022 10:59:53 -------------------------------------------------------------------------------- Pain Assessment Details Patient Name: Date of Service: Denise Bush, Denise Bush 04/18/2022 10:30 A M Medical Record Number: [id number] Patient Account Number: [id number] Date of Birth/Sex: Treating RN: March 04, 1951 (71 y.o. F) Primary Care Maysen Bonsignore: Cletis [place] Other Clinician: Referring Kamani Magnussen: Treating Eraina Winnie/Extender: Colvin Caroli in Treatment: 1 Active Problems Location of Pain Severity and Description of Pain Patient Has Paino No Site Locations [place], [place] ([id number]) [id number] Page 5 of 7 Pain Management and Medication Current Pain Management: Electronic Signature(s) Signed: 04/18/2022 10:56:05 AM By: Worthy Rancher Entered By: Worthy Rancher on 04/18/2022 10:36:52 -------------------------------------------------------------------------------- Patient/Caregiver Education Details Patient Name: Date of Service: Denise Bush 12/19/2023andnbsp10:30 A M Medical Record Number: [id number] Patient Account Number: [id number] Date of Birth/Gender: Treating RN: [birth year] (71 y.o. F) Primary Care Physician: Cletis [place] Other Clinician: Referring Physician: Treating Physician/Extender: Colvin Caroli in Treatment: 1 Education Assessment Education Provided To: Patient Education Topics Provided Wound/Skin Impairment: Methods: Explain/Verbal Responses: Reinforcements needed, State content correctly  Electronic Signature(s) Signed: 04/18/2022 10:56:05 AM By: Worthy Rancher Entered By: Worthy Rancher on 04/18/2022 10:52:40 -------------------------------------------------------------------------------- Wound Assessment Details Patient Name: Date of Service: Denise Bush, Denise Bush 04/18/2022 10:30 A M Medical Record Number: [id number] Patient Account Number:  [id number] Date of Birth/Sex: Treating RN: 01-09-1951 (71 y.o. F) Primary Care Jafari Mckillop: Cletis [place] Other Clinician: Referring Andriea Hasegawa: Treating Paityn Balsam/Extender: Jake Michaelis [place], Jomarie Longs ([id number]) [id number] Page 6 of 7 Weeks in Treatment: 1 Wound Status Wound Number: 1 Primary Etiology: Dehisced Wound Wound Location: Right Knee Wound Status: Open Wounding Event: Surgical Injury Comorbid History: Cataracts, End Stage Renal Disease, Osteoarthritis Date Acquired: 01/24/2022 Weeks Of Treatment: 1 Clustered Wound: No Photos Wound Measurements Length: (cm) 5 Width: (cm) 1.2 Depth: (cm) 0.1 Area: (cm) 4.712 Volume: (cm) 0.471 % Reduction in Area: 23.1% % Reduction in Volume: 80.8% Epithelialization: Small (1-33%) Wound Description Classification: Full Thickness Without Exposed Su Wound Margin: Distinct, outline attached Exudate Amount: Medium Exudate Type: Serosanguineous Exudate Color: red, brown pport Structures Wound Bed Granulation Amount: Small (1-33%) Exposed Structure Granulation Quality: Pink Fascia Exposed: No Necrotic Amount: Large (67-100%) Fat Layer (Subcutaneous Tissue) Exposed: Yes Necrotic Quality: Eschar, Adherent Slough Tendon Exposed: No Muscle Exposed: No Joint Exposed: No Bone Exposed: No Periwound Skin Texture Texture Color No Abnormalities Noted: No No Abnormalities Noted: Yes Scarring: Yes Temperature / Pain Temperature: No Abnormality Moisture No Abnormalities Noted: Yes Treatment Notes Wound #1 (Knee) Wound Laterality: Right Cleanser Soap and Water Discharge Instruction: May shower and wash wound with dial antibacterial soap and water prior to dressing change. Wound Cleanser Discharge Instruction: Cleanse the wound with wound cleanser prior to applying a clean dressing using gauze sponges, not tissue or cotton balls. Peri-Wound Care Topical Primary Dressing Hydrofera Blue  Ready Transfer Foam, 4x5 (in/in) Discharge Instruction: Apply to wound bed as instructed Secondary Dressing Denise Bush, Denise Bush ([id number]) [phone].pdf Page 7 of 7 Bordered Gauze, 4x4 in Discharge Instruction: Apply over primary dressing as directed. Secured With Compression Wrap Compression Stockings Environmental education officer) Signed: 04/18/2022 10:54:00 AM By: Blanche East RN Entered By: Blanche East on 04/18/2022 10:53:59 -------------------------------------------------------------------------------- Vitals Details Patient Name: Date of Service: Denise Bush. 04/18/2022 10:30 A M Medical Record Number: [id number] Patient Account Number: [id number] Date of Birth/Sex: Treating RN: 06/20/1950 (71 y.o. F) Primary Care Nesiah Jump: Cletis [place] Other Clinician: Referring Kataleena Holsapple: Treating Mishelle Hassan/Extender: Colvin Caroli in Treatment: 1 Vital Signs Time Taken: 10:30 Temperature (F): 98.6 Height (in): 65 Pulse (bpm): 98 Weight (lbs): 165 Respiratory Rate (breaths/min): 20 Body Mass Index (BMI): 27.5 Blood Pressure (mmHg): 155/64 Capillary Blood Glucose (mg/dl): 170 Reference Range: 80 - 120 mg / dl Electronic Signature(s) Signed: 04/18/2022 10:56:05 AM By: Worthy Rancher Entered By: Worthy Rancher on 04/18/2022 10:36:44

## 2022-04-18 NOTE — Progress Notes (Signed)
GIZELL, DANSER ([id number]) [id number] Page 1 of 8 Visit Report for 04/18/2022 Chief Complaint Document Details Patient Name: Date of Service: Denise Bush, Denise Bush 04/18/2022 10:30 A M Medical Record Number: [id number] Patient Account Number: [id number] Date of Birth/Sex: Treating RN: 09/22/1950 (71 y.o. F) Primary Care Provider: Cletis Athens Other Clinician: Referring Provider: Treating Provider/Extender: Colvin Caroli in Treatment: 1 Information Obtained from: Patient Chief Complaint 04/11/2022; patient is here for a nonhealing surgical wound on her right knee Electronic Signature(s) Signed: 04/18/2022 11:03:37 AM By: Fredirick Maudlin MD FACS Entered By: Fredirick Maudlin on 04/18/2022 11:03:37 -------------------------------------------------------------------------------- Debridement Details Patient Name: Date of Service: Denise Bush 04/18/2022 10:30 A M Medical Record Number: [id number] Patient Account Number: [id number] Date of Birth/Sex: Treating RN: 03-15-1951 (71 y.o. F) Primary Care Provider: Cletis Athens Other Clinician: Referring Provider: Treating Provider/Extender: Colvin Caroli in Treatment: 1 Debridement Performed for Assessment: Wound #1 Right Knee Performed By: Physician Fredirick Maudlin, MD Debridement Type: Debridement Level of Consciousness (Pre-procedure): Awake and Alert Pre-procedure Verification/Time Out Yes - 10:56 Taken: Start Time: 10:57 Pain Control: Lidocaine 5% topical ointment T Area Debrided (L x W): otal 5 (cm) x 1.2 (cm) = 6 (cm) Tissue and other material debrided: Viable, Non-Viable, Eschar, Slough, Subcutaneous, Slough Level: Skin/Subcutaneous Tissue Debridement Description: Excisional Instrument: Curette Bleeding: Minimum Hemostasis Achieved: Pressure Response to Treatment: Procedure was tolerated well Level of Consciousness (Post- Awake and  Alert procedure): Post Debridement Measurements of Total Wound Length: (cm) 5 Width: (cm) 1.2 Depth: (cm) 0.1 Volume: (cm) 0.471 Character of Wound/Ulcer Post Debridement: Improved Post Procedure Diagnosis Same as Denise Bush, Denise Bush ([id number]) [id number] Page 2 of 8 Notes Scribed for Dr. Celine Ahr by Blanche East, RN Electronic Signature(s) Signed: 04/18/2022 11:19:43 AM By: Worthy Rancher Signed: 04/18/2022 11:22:41 AM By: Fredirick Maudlin MD FACS Entered By: Worthy Rancher on 04/18/2022 10:58:12 -------------------------------------------------------------------------------- HPI Details Patient Name: Date of Service: Denise Bush. 04/18/2022 10:30 A M Medical Record Number: [id number] Patient Account Number: [id number] Date of Birth/Sex: Treating RN: Dec 25, 1950 (71 y.o. F) Primary Care Provider: Cletis Athens Other Clinician: Referring Provider: Treating Provider/Extender: Colvin Caroli in Treatment: 1 History of Present Illness HPI Description: ADMISSION 04/11/2022 This is a pleasant 71 year old woman accompanied by her husband. She has a history of type 2 diabetes reasonably well-controlled status post kidney transplant in 2019. She had a right total knee replacement towards the end of September of this year. According to the patient this never really healed they used surgical glue for adhesion that was not sutured. She simply says it never closed. This has been followed by orthopedic surgery and they have referred her here apparently she has been using Vaseline gauze. The wound is 6 x 1.3 x 0.4 and completely eschar covered Past medical history includes type 2 diabetes with a kidney transplant in 2019. She had a right total knee replacement on 01/21/2022. She has a history of SIADH, hypothyroidism she is legally blind. ABI in our clinic was 1.16 04/18/2022: Compared to last week's measurements and description, the  wound is a little bit smaller today. It is also quite a bit cleaner. They have been using Hydrofera Blue with a foam border dressing. Electronic Signature(s) Signed: 04/18/2022 11:04:18 AM By: Fredirick Maudlin MD FACS Entered By: Fredirick Maudlin on 04/18/2022 11:04:17 -------------------------------------------------------------------------------- Physical Exam Details Patient Name: Date of Service: Denise Bush 04/18/2022 10:30 A M Medical Record Number: [id number] Patient Account Number: [id number] Date of Birth/Sex:  Treating RN: [birth year] (71 y.o. F) Primary Care Provider: Cletis Athens Other Clinician: Referring Provider: Treating Provider/Extender: Colvin Caroli in Treatment: 1 Constitutional She is hypertensive, but asymptomatic.. . . . No acute distress. Respiratory Normal work of breathing on room air. Notes 04/18/2022: Compared to last week's measurements and description, the wound is a little bit smaller today. It is also quite a bit cleaner. Electronic Signature(s) Signed: 04/18/2022 11:05:07 AM By: Fredirick Maudlin MD [place], Jomarie Longs ([id number]) [id number] Page 3 of 8 Signed: 04/18/2022 11:05:07 AM By: Fredirick Maudlin MD FACS Entered By: Fredirick Maudlin on 04/18/2022 11:05:07 -------------------------------------------------------------------------------- Physician Orders Details Patient Name: Date of Service: Denise Bush 04/18/2022 10:30 A M Medical Record Number: [id number] Patient Account Number: [id number] Date of Birth/Sex: Treating RN: June 09, 1950 (71 y.o. F) Primary Care Provider: Cletis Athens Other Clinician: Referring Provider: Treating Provider/Extender: Colvin Caroli in Treatment: 1 Verbal / Phone Orders: No Diagnosis Coding ICD-10 Coding Code Description T81.31XD Disruption of external operation (surgical) wound, not elsewhere classified, subsequent  encounter S81.001D Unspecified open wound, right knee, subsequent encounter E11.622 Type 2 diabetes mellitus with other skin ulcer Z94.0 Kidney transplant status Follow-up Appointments ppointment in 1 week. - Dr. Reyes Ivan Room 3 Return A Anesthetic (In clinic) Topical Lidocaine 5% applied to wound bed - Used in Clinic Bathing/ Shower/ Hygiene Other Bathing/Shower/Hygiene Orders/Instructions: - May change wound every other day, if so desired, after bathing. Additional Orders / Instructions Other: - Do Not put pressure on the right knee Home Health Wound #1 Right Knee Admit to Home Health for skilled nursing wound care. May utilize formulary equivalent dressing for wound treatment orders unless otherwise specified. - Referral for [facility] Orders/Instructions: - [place] to perform wound care 1 x week for 9 weeks Wound Treatment Wound #1 - Knee Wound Laterality: Right Cleanser: Soap and Water Every Other Day/30 Days Discharge Instructions: May shower and wash wound with dial antibacterial soap and water prior to dressing change. Cleanser: Wound Cleanser (Generic) Every Other Day/30 Days Discharge Instructions: Cleanse the wound with wound cleanser prior to applying a clean dressing using gauze sponges, not tissue or cotton balls. Prim Dressing: Hydrofera Blue Ready Transfer Foam, 4x5 (in/in) (Generic) Every Other Day/30 Days ary Discharge Instructions: Apply to wound bed as instructed Secondary Dressing: Bordered Gauze, 4x4 in (Generic) Every Other Day/30 Days Discharge Instructions: Apply over primary dressing as directed. Electronic Signature(s) Signed: 04/18/2022 11:22:41 AM By: Fredirick Maudlin MD FACS Entered By: Fredirick Maudlin on 04/18/2022 11:06:45 Sheral Apley ([id number]) [id number] Page 4 of 8 -------------------------------------------------------------------------------- Problem List Details Patient  Name: Date of Service: Denise Bush, Denise Bush 04/18/2022 10:30 A M Medical Record Number: [id number] Patient Account Number: [id number] Date of Birth/Sex: Treating RN: 1950-08-22 (71 y.o. F) Primary Care Provider: Cletis Athens Other Clinician: Referring Provider: Treating Provider/Extender: Colvin Caroli in Treatment: 1 Active Problems ICD-10 Encounter Code Description Active Date MDM Diagnosis T81.31XD Disruption of external operation (surgical) wound, not elsewhere classified, 04/11/2022 No Yes subsequent encounter S81.001D Unspecified open wound, right knee, subsequent encounter 04/11/2022 No Yes E11.622 Type 2 diabetes mellitus with other skin ulcer 04/11/2022 No Yes Z94.0 Kidney transplant status 04/11/2022 No Yes Inactive Problems Resolved Problems Electronic Signature(s) Signed: 04/18/2022 11:03:07 AM By: Fredirick Maudlin MD FACS Entered By: Fredirick Maudlin on 04/18/2022 11:03:06 -------------------------------------------------------------------------------- Progress Note Details Patient Name: Date of Service: Denise Bush 04/18/2022 10:30 A M Medical Record Number: [id number] Patient Account  Number: [id number] Date of Birth/Sex: Treating RN: March 15, 1951 (71 y.o. F) Primary Care Provider: Cletis Athens Other Clinician: Referring Provider: Treating Provider/Extender: Colvin Caroli in Treatment: 1 Subjective Chief Complaint Information obtained from Patient 04/11/2022; patient is here for a nonhealing surgical wound on her right knee History of Present Illness (HPI) ADMISSION 04/11/2022 This is a pleasant 71 year old woman accompanied by her husband. She has a history of type 2 diabetes reasonably well-controlled status post kidney Denise Bush, Denise Bush ([id number]) [id number] Page 5 of 8 transplant in 2019. She had a right total knee replacement towards the end of September of this year.  According to the patient this never really healed they used surgical glue for adhesion that was not sutured. She simply says it never closed. This has been followed by orthopedic surgery and they have referred her here apparently she has been using Vaseline gauze. The wound is 6 x 1.3 x 0.4 and completely eschar covered Past medical history includes type 2 diabetes with a kidney transplant in 2019. She had a right total knee replacement on 01/21/2022. She has a history of SIADH, hypothyroidism she is legally blind. ABI in our clinic was 1.16 04/18/2022: Compared to last week's measurements and description, the wound is a little bit smaller today. It is also quite a bit cleaner. They have been using Hydrofera Blue with a foam border dressing. Patient History Information obtained from Patient. Family History Unknown History. Social History Never smoker, Marital Status - Married, Alcohol Use - Never, Drug Use - No History, Caffeine Use - Rarely. Medical History Eyes Patient has history of Cataracts Genitourinary Patient has history of End Stage Renal Disease - Was On Dialysis Musculoskeletal Patient has history of Osteoarthritis - Knees Hospitalization/Surgery History - Right T knee arthroplasty. otal Medical A Surgical History Notes nd Eyes Hx: Diabetic Retinopathy Hematologic/Lymphatic Hx: Blood Transfusions Cardiovascular Hx: Hashimoto's disease Endocrine Hx: Hyperparathyroidism secondary to renal;Hypothyroidism Diabetes Mellitus Type 2 (35 years) Genitourinary Hx: Of Right Kidney transplant Objective Constitutional She is hypertensive, but asymptomatic.Marland Kitchen No acute distress. Vitals Time Taken: 10:30 AM, Height: 65 in, Weight: 165 lbs, BMI: 27.5, Temperature: 98.6 F, Pulse: 98 bpm, Respiratory Rate: 20 breaths/min, Blood Pressure: 155/64 mmHg, Capillary Blood Glucose: 170 mg/dl. Respiratory Normal work of breathing on room air. General Notes: 04/18/2022: Compared to last  week's measurements and description, the wound is a little bit smaller today. It is also quite a bit cleaner. Integumentary (Hair, Skin) Wound #1 status is Open. Original cause of wound was Surgical Injury. The date acquired was: 01/24/2022. The wound has been in treatment 1 weeks. The wound is located on the Right Knee. The wound measures 5cm length x 1.2cm width x 0.1cm depth; 4.712cm^2 area and 0.471cm^3 volume. There is Fat Layer (Subcutaneous Tissue) exposed. There is a medium amount of serosanguineous drainage noted. The wound margin is distinct with the outline attached to the wound base. There is small (1-33%) pink granulation within the wound bed. There is a large (67-100%) amount of necrotic tissue within the wound bed including Eschar and Adherent Slough. The periwound skin appearance had no abnormalities noted for moisture. The periwound skin appearance had no abnormalities noted for color. The periwound skin appearance exhibited: Scarring. Periwound temperature was noted as No Abnormality. Assessment Active Problems ICD-10 Disruption of external operation (surgical) wound, not elsewhere classified, subsequent encounter Unspecified open wound, right knee, subsequent encounter Type 2 diabetes mellitus with other skin ulcer Kidney transplant status Denise Bush, Denise Bush ([id number]) [id number] Page 6 of  8 Procedures Wound #1 Pre-procedure diagnosis of Wound #1 is a Dehisced Wound located on the Right Knee . There was a Excisional Skin/Subcutaneous Tissue Debridement with a total area of 6 sq cm performed by Fredirick Maudlin, MD. With the following instrument(s): Curette to remove Viable and Non-Viable tissue/material. Material removed includes Eschar, Subcutaneous Tissue, and Slough after achieving pain control using Lidocaine 5% topical ointment. No specimens were taken. A time out was conducted at 10:56, prior to the start of the procedure. A Minimum amount of  bleeding was controlled with Pressure. The procedure was tolerated well. Post Debridement Measurements: 5cm length x 1.2cm width x 0.1cm depth; 0.471cm^3 volume. Character of Wound/Ulcer Post Debridement is improved. Post procedure Diagnosis Wound #1: Same as Pre-Procedure General Notes: Scribed for Dr. Celine Ahr by Blanche East, RN. Plan Follow-up Appointments: Return Appointment in 1 week. - Dr. Reyes Ivan Room 3 Anesthetic: (In clinic) Topical Lidocaine 5% applied to wound bed - Used in Clinic Bathing/ Shower/ Hygiene: Other Bathing/Shower/Hygiene Orders/Instructions: - May change wound every other day, if so desired, after bathing. Additional Orders / Instructions: Other: - Do Not put pressure on the right knee Home Health: Wound #1 Right Knee: Admit to Home Health for skilled nursing wound care. May utilize formulary equivalent dressing for wound treatment orders unless otherwise specified. - Referral for [facility] Orders/Instructions: - [place] to perform wound care 1 x week for 9 weeks WOUND #1: - Knee Wound Laterality: Right Cleanser: Soap and Water Every Other Day/30 Days Discharge Instructions: May shower and wash wound with dial antibacterial soap and water prior to dressing change. Cleanser: Wound Cleanser (Generic) Every Other Day/30 Days Discharge Instructions: Cleanse the wound with wound cleanser prior to applying a clean dressing using gauze sponges, not tissue or cotton balls. Prim Dressing: Hydrofera Blue Ready Transfer Foam, 4x5 (in/in) (Generic) Every Other Day/30 Days ary Discharge Instructions: Apply to wound bed as instructed Secondary Dressing: Bordered Gauze, 4x4 in (Generic) Every Other Day/30 Days Discharge Instructions: Apply over primary dressing as directed. 04/18/2022: Compared to last week's measurements and description, the wound is a little bit smaller today. It is also quite a bit cleaner. I used a curette to debride  slough, eschar, and nonviable subcutaneous tissue from the wound. We will continue Hydrofera Blue and a foam border dressing. Follow-up in about 1 week. Electronic Signature(s) Signed: 04/18/2022 11:07:47 AM By: Fredirick Maudlin MD FACS Entered By: Fredirick Maudlin on 04/18/2022 11:07:47 -------------------------------------------------------------------------------- HxROS Details Patient Name: Date of Service: Denise Bush. 04/18/2022 10:30 A M Medical Record Number: [id number] Patient Account Number: [id number] Date of Birth/Sex: Treating RN: 06/27/1950 (71 y.o. F) Primary Care Provider: Cletis Athens Other Clinician: Referring Provider: Treating Provider/Extender: Colvin Caroli in Treatment: 1 Information Obtained From Patient [place], [place] ([id number]) [id number] Page 7 of 8 Medical History: Positive for: Cataracts Past Medical History Notes: Hx: Diabetic Retinopathy Hematologic/Lymphatic Medical History: Past Medical History Notes: Hx: Blood Transfusions Cardiovascular Medical History: Past Medical History Notes: Hx: Hashimoto's disease Endocrine Medical History: Past Medical History Notes: Hx: Hyperparathyroidism secondary to renal;Hypothyroidism Diabetes Mellitus Type 2 (35 years) Genitourinary Medical History: Positive for: End Stage Renal Disease - Was On Dialysis Past Medical History Notes: Hx: Of Right Kidney transplant Musculoskeletal Medical History: Positive for: Osteoarthritis - Knees HBO Extended History Items Eyes: Cataracts Immunizations Pneumococcal Vaccine: Received Pneumococcal Vaccination: Yes Received Pneumococcal Vaccination On or After 60th Birthday: Yes Implantable Devices Yes Hospitalization / Surgery History Type  of Hospitalization/Surgery Right T knee arthroplasty otal Family and Social History Unknown History: Yes; Never smoker; Marital Status - Married; Alcohol Use:  Never; Drug Use: No History; Caffeine Use: Rarely; Financial Concerns: No; Food, Clothing or Shelter Needs: No; Support System Lacking: No; Transportation Concerns: No Electronic Signature(s) Signed: 04/18/2022 11:22:41 AM By: Fredirick Maudlin MD FACS Entered By: Fredirick Maudlin on 04/18/2022 11:04:31 -------------------------------------------------------------------------------- SuperBill Details Patient Name: Date of Service: Denise Bush 04/18/2022 Medical Record Number: [id number] Patient Account Number: [id number] Date of Birth/Sex: Treating RN: 02-29-1951 (71 y.o. F) Primary Care Provider: Cletis Athens Other Clinician: Referring Provider: Treating Provider/Extender: Jake Michaelis [place], Jomarie Longs ([id number]) [id number] Page 8 of 8 Weeks in Treatment: 1 Diagnosis Coding ICD-10 Codes Code Description T81.31XD Disruption of external operation (surgical) wound, not elsewhere classified, subsequent encounter S81.001D Unspecified open wound, right knee, subsequent encounter E11.622 Type 2 diabetes mellitus with other skin ulcer Z94.0 Kidney transplant status Facility Procedures : CPT4 Code: [id number] Description: 62263 - DEB SUBQ TISSUE 20 SQ CM/< ICD-10 Diagnosis Description S81.001D Unspecified open wound, right knee, subsequent encounter T81.31XD Disruption of external operation (surgical) wound, not elsewhere classified, subs Modifier: equent encounter Quantity: 1 Physician Procedures : CPT4 Code Description Modifier [id number] - WC PHYS LEVEL 3 - EST PT 25 ICD-10 Diagnosis Description T81.31XD Disruption of external operation (surgical) wound, not elsewhere classified, subsequent encounter S81.001D Unspecified open wound, right  knee, subsequent encounter E11.622 Type 2 diabetes mellitus with other skin ulcer Z94.0 Kidney transplant status Quantity: 1 : [id number] 11042 - WC PHYS SUBQ TISS 20 SQ CM ICD-10 Diagnosis  Description S81.001D Unspecified open wound, right knee, subsequent encounter T81.31XD Disruption of external operation (surgical) wound, not elsewhere classified, subsequent encounter Quantity: 1 Electronic Signature(s) Signed: 04/18/2022 11:08:06 AM By: Fredirick Maudlin MD FACS Entered By: Fredirick Maudlin on 04/18/2022 11:08:05

## 2022-04-28 ENCOUNTER — Encounter (HOSPITAL_COMMUNITY)
Admission: RE | Admit: 2022-04-28 | Discharge: 2022-04-28 | Disposition: A | Discharge status: Home / Self Care | Payer: Medicare PPO | Source: Ambulatory Visit | Attending: Internal Medicine | Admitting: Internal Medicine

## 2022-04-28 ENCOUNTER — Encounter (HOSPITAL_BASED_OUTPATIENT_CLINIC_OR_DEPARTMENT_OTHER): Payer: Medicare PPO | Admitting: General Surgery

## 2022-04-28 DIAGNOSIS — Z940 Kidney transplant status: Secondary | ICD-10-CM | POA: Diagnosis not present

## 2022-04-28 DIAGNOSIS — E11622 Type 2 diabetes mellitus with other skin ulcer: Secondary | ICD-10-CM | POA: Diagnosis not present

## 2022-04-28 LAB — URINALYSIS, COMPLETE (UACMP) WITH MICROSCOPIC
Bilirubin Urine: NEGATIVE
Glucose, UA: NEGATIVE mg/dL
Hgb urine dipstick: NEGATIVE
Ketones, ur: NEGATIVE mg/dL
Nitrite: NEGATIVE
Protein, ur: NEGATIVE mg/dL
Specific Gravity, Urine: 1.018 (ref 1.005–1.030)
pH: 5 (ref 5.0–8.0)

## 2022-04-28 LAB — PROTEIN / CREATININE RATIO, URINE
Creatinine, Urine: 114 mg/dL
Protein Creatinine Ratio: 0.14 mg/mg{Cre} (ref 0.00–0.15)
Total Protein, Urine: 16 mg/dL

## 2022-04-28 LAB — CBC WITH DIFFERENTIAL/PLATELET
Abs Immature Granulocytes: 0.04 10*3/uL (ref 0.00–0.07)
Basophils Absolute: 0 10*3/uL (ref 0.0–0.1)
Basophils Relative: 1 %
Eosinophils Absolute: 0.1 10*3/uL (ref 0.0–0.5)
Eosinophils Relative: 5 %
HCT: 42.6 % (ref 36.0–46.0)
Hemoglobin: 13.1 g/dL (ref 12.0–15.0)
Immature Granulocytes: 2 %
Lymphocytes Relative: 36 %
Lymphs Abs: 0.9 10*3/uL (ref 0.7–4.0)
MCH: 28.8 pg (ref 26.0–34.0)
MCHC: 30.8 g/dL (ref 30.0–36.0)
MCV: 93.6 fL (ref 80.0–100.0)
Monocytes Absolute: 0.5 10*3/uL (ref 0.1–1.0)
Monocytes Relative: 19 %
Neutro Abs: 0.9 10*3/uL — ABNORMAL LOW (ref 1.7–7.7)
Neutrophils Relative %: 37 %
Platelets: 220 10*3/uL (ref 150–400)
RBC: 4.55 MIL/uL (ref 3.87–5.11)
RDW: 16.7 % — ABNORMAL HIGH (ref 11.5–15.5)
WBC: 2.5 10*3/uL — ABNORMAL LOW (ref 4.0–10.5)
nRBC: 0 % (ref 0.0–0.2)

## 2022-04-28 LAB — BASIC METABOLIC PANEL
Anion gap: 6 (ref 5–15)
BUN: 20 mg/dL (ref 8–23)
CO2: 23 mmol/L (ref 22–32)
Calcium: 10 mg/dL (ref 8.9–10.3)
Chloride: 114 mmol/L — ABNORMAL HIGH (ref 98–111)
Creatinine, Ser: 0.89 mg/dL (ref 0.44–1.00)
GFR, Estimated: >60 mL/min (ref >60)
Glucose, Bld: 107 mg/dL — ABNORMAL HIGH (ref 70–99)
Potassium: 4.1 mmol/L (ref 3.5–5.1)
Sodium: 143 mmol/L (ref 135–145)

## 2022-04-28 MED ORDER — SODIUM CHLORIDE 0.9 % IV SOLN
5.0000 mg/kg | INTRAVENOUS | Status: DC
  Administered 2022-04-28: 350 mg via INTRAVENOUS
  Filled 2022-04-28: qty 350

## 2022-04-28 NOTE — Progress Notes (Signed)
HADLI, VANDEMARK ([id number]) [id number] Page 1 of 8 Visit Report for 04/28/2022 Chief Complaint Document Details Patient Name: Date of Service: Denise Bush, Denise Bush 04/28/2022 8:00 A M Medical Record Number: [id number] Patient Account Number: [id number] Date of Birth/Sex: Treating RN: 1950-12-18 (71 y.o. F) Primary Care Provider: Cletis Athens Other Clinician: Referring Provider: Treating Provider/Extender: Colvin Caroli in Treatment: 2 Information Obtained from: Patient Chief Complaint 04/11/2022; patient is here for a nonhealing surgical wound on her right knee Electronic Signature(s) Signed: 04/28/2022 8:50:48 AM By: Fredirick Maudlin MD FACS Entered By: Fredirick Maudlin on 04/28/2022 08:50:48 -------------------------------------------------------------------------------- Debridement Details Patient Name: Date of Service: Denise Bush. 04/28/2022 8:00 A M Medical Record Number: [id number] Patient Account Number: [id number] Date of Birth/Sex: Treating RN: 30-Sep-1950 (71 y.o. America Brown Primary Care Provider: Cletis Athens Other Clinician: Referring Provider: Treating Provider/Extender: Colvin Caroli in Treatment: 2 Debridement Performed for Assessment: Wound #1 Right Knee Performed By: Physician Fredirick Maudlin, MD Debridement Type: Debridement Level of Consciousness (Pre-procedure): Awake and Alert Pre-procedure Verification/Time Out Yes - 08:25 Taken: Start Time: 08:25 Pain Control: Lidocaine 5% topical ointment T Area Debrided (L x W): otal 3.8 (cm) x 0.6 (cm) = 2.28 (cm) Tissue and other material debrided: Non-Viable, Eschar, Slough, Subcutaneous, Slough Level: Skin/Subcutaneous Tissue Debridement Description: Excisional Instrument: Curette Bleeding: Minimum Hemostasis Achieved: Pressure End Time: 08:26 Procedural Pain: 0 Post Procedural Pain: 0 Response to Treatment: Procedure  was tolerated well Level of Consciousness (Post- Awake and Alert procedure): Post Debridement Measurements of Total Wound Length: (cm) 3.8 Width: (cm) 0.6 Depth: (cm) 0.1 Volume: (cm) 0.179 Character of Wound/Ulcer Post Debridement: Improved Post Procedure Diagnosis Denise Bush, Denise Bush ([id number]) [id number] Page 2 of 8 Same as Pre-procedure Notes Scribed for Dr. Celine Ahr by J.Scotton Electronic Signature(s) Signed: 04/28/2022 10:54:20 AM By: Fredirick Maudlin MD FACS Signed: 04/28/2022 12:29:59 PM By: Dellie Catholic RN Entered By: Dellie Catholic on 04/28/2022 08:33:03 -------------------------------------------------------------------------------- HPI Details Patient Name: Date of Service: Denise Bush. 04/28/2022 8:00 A M Medical Record Number: [id number] Patient Account Number: [id number] Date of Birth/Sex: Treating RN: 02-Aug-1950 (71 y.o. F) Primary Care Provider: Cletis Athens Other Clinician: Referring Provider: Treating Provider/Extender: Colvin Caroli in Treatment: 2 History of Present Illness HPI Description: ADMISSION 04/11/2022 This is a pleasant 70 year old woman accompanied by her husband. She has a history of type 2 diabetes reasonably well-controlled status post kidney transplant in 2019. She had a right total knee replacement towards the end of September of this year. According to the patient this never really healed they used surgical glue for adhesion that was not sutured. She simply says it never closed. This has been followed by orthopedic surgery and they have referred her here apparently she has been using Vaseline gauze. The wound is 6 x 1.3 x 0.4 and completely eschar covered Past medical history includes type 2 diabetes with a kidney transplant in 2019. She had a right total knee replacement on 01/21/2022. She has a history of SIADH, hypothyroidism she is legally blind. ABI in our clinic was  1.16 04/18/2022: Compared to last week's measurements and description, the wound is a little bit smaller today. It is also quite a bit cleaner. They have been using Hydrofera Blue with a foam border dressing. 04/28/2022: The wound is contracting nicely. It is a little bit dry on the surface. There is slough and eschar accumulation. No concern for infection. Electronic Signature(s) Signed: 04/28/2022 8:51:23 AM By: Fredirick Maudlin MD FACS  Entered By: Fredirick Maudlin on 04/28/2022 08:51:22 -------------------------------------------------------------------------------- Physical Exam Details Patient Name: Date of Service: Denise Bush, Denise Bush 04/28/2022 8:00 A M Medical Record Number: [id number] Patient Account Number: [id number] Date of Birth/Sex: Treating RN: [birth year] (71 y.o. F) Primary Care Provider: Cletis Athens Other Clinician: Referring Provider: Treating Provider/Extender: Colvin Caroli in Treatment: 2 Constitutional . . . . No acute distress. Respiratory Normal work of breathing on room air. Denise Bush, Denise Bush ([id number]) [id number] Page 3 of 8 Notes 04/28/2022: The wound is contracting nicely. It is a little bit dry on the surface. There is slough and eschar accumulation. No concern for infection. Electronic Signature(s) Signed: 04/28/2022 8:51:50 AM By: Fredirick Maudlin MD FACS Entered By: Fredirick Maudlin on 04/28/2022 08:51:50 -------------------------------------------------------------------------------- Physician Orders Details Patient Name: Date of Service: Denise Bush 04/28/2022 8:00 A M Medical Record Number: [id number] Patient Account Number: [id number] Date of Birth/Sex: Treating RN: 04-Feb-1951 (71 y.o. America Brown Primary Care Provider: Cletis Athens Other Clinician: Referring Provider: Treating Provider/Extender: Colvin Caroli in Treatment: 2 Verbal / Phone Orders:  No Diagnosis Coding ICD-10 Coding Code Description S81.001D Unspecified open wound, right knee, subsequent encounter T81.31XD Disruption of external operation (surgical) wound, not elsewhere classified, subsequent encounter E11.622 Type 2 diabetes mellitus with other skin ulcer Z94.0 Kidney transplant status Follow-up Appointments ppointment in 1 week. - Dr. Celine Ahr Room 3 Return A Anesthetic (In clinic) Topical Lidocaine 5% applied to wound bed - Used in Clinic Bathing/ Shower/ Hygiene Other Bathing/Shower/Hygiene Orders/Instructions: - May change wound every other day, if so desired, after bathing. Additional Orders / Instructions Other: - Do Not put pressure on the right knee Home Health Wound #1 Right Knee New wound care orders this week; continue Home Health for wound care. May utilize formulary equivalent dressing for wound treatment orders unless otherwise specified. - Moistenen with Hydrogel to Hydrafera Blue Classic dressing Other Home Health Orders/Instructions: - [place] to perform wound care 1 x week for 9 weeks Wound Treatment Wound #1 - Knee Wound Laterality: Right Cleanser: Soap and Water Every Other Day/30 Days Discharge Instructions: May shower and wash wound with dial antibacterial soap and water prior to dressing change. Cleanser: Wound Cleanser (Generic) Every Other Day/30 Days Discharge Instructions: Cleanse the wound with wound cleanser prior to applying a clean dressing using gauze sponges, not tissue or cotton balls. Prim Dressing: Hydrofera Blue Ready Transfer Foam, 4x5 (in/in) (Generic) Every Other Day/30 Days ary Discharge Instructions: Apply to wound bed , moisten with Hydrogel Secondary Dressing: Bordered Gauze, 4x4 in (Generic) Every Other Day/30 Days Discharge Instructions: Apply over primary dressing as directed. Electronic Signature(s) Signed: 04/28/2022 10:54:20 AM By: Fredirick Maudlin MD FACS Entered By: Fredirick Maudlin on 04/28/2022  08:52:04 Denise Bush ([id number]) [id number] Page 4 of 8 -------------------------------------------------------------------------------- Problem List Details Patient Name: Date of Service: JAYMA, VOLPI 04/28/2022 8:00 A M Medical Record Number: [id number] Patient Account Number: [id number] Date of Birth/Sex: Treating RN: Sep 04, 1950 (71 y.o. F) Primary Care Provider: Cletis Athens Other Clinician: Referring Provider: Treating Provider/Extender: Colvin Caroli in Treatment: 2 Active Problems ICD-10 Encounter Code Description Active Date MDM Diagnosis S81.001D Unspecified open wound, right knee, subsequent encounter 04/11/2022 No Yes T81.31XD Disruption of external operation (surgical) wound, not elsewhere classified, 04/11/2022 No Yes subsequent encounter E11.622 Type 2 diabetes mellitus with other skin ulcer 04/11/2022 No Yes Z94.0 Kidney transplant status 04/11/2022 No Yes Inactive Problems Resolved Problems Electronic Signature(s) Signed: 04/28/2022 8:50:36 AM By:  Fredirick Maudlin MD FACS Entered By: Fredirick Maudlin on 04/28/2022 08:50:36 -------------------------------------------------------------------------------- Progress Note Details Patient Name: Date of Service: Denise Bush, Denise Bush 04/28/2022 8:00 A M Medical Record Number: [id number] Patient Account Number: [id number] Date of Birth/Sex: Treating RN: [birth year] (71 y.o. F) Primary Care Provider: Cletis Athens Other Clinician: Referring Provider: Treating Provider/Extender: Colvin Caroli in Treatment: 2 Subjective Chief Complaint Information obtained from Patient 04/11/2022; patient is here for a nonhealing surgical wound on her right knee History of Present Illness (HPI) ADMISSION 04/11/2022 This is a pleasant 71 year old woman accompanied by her husband. She has a history of type 2 diabetes reasonably well-controlled status post  kidney Denise Bush, Denise Bush ([id number]) [id number] Page 5 of 8 transplant in 2019. She had a right total knee replacement towards the end of September of this year. According to the patient this never really healed they used surgical glue for adhesion that was not sutured. She simply says it never closed. This has been followed by orthopedic surgery and they have referred her here apparently she has been using Vaseline gauze. The wound is 6 x 1.3 x 0.4 and completely eschar covered Past medical history includes type 2 diabetes with a kidney transplant in 2019. She had a right total knee replacement on 01/21/2022. She has a history of SIADH, hypothyroidism she is legally blind. ABI in our clinic was 1.16 04/18/2022: Compared to last week's measurements and description, the wound is a little bit smaller today. It is also quite a bit cleaner. They have been using Hydrofera Blue with a foam border dressing. 04/28/2022: The wound is contracting nicely. It is a little bit dry on the surface. There is slough and eschar accumulation. No concern for infection. Patient History Information obtained from Patient. Family History Unknown History. Social History Never smoker, Marital Status - Married, Alcohol Use - Never, Drug Use - No History, Caffeine Use - Rarely. Medical History Eyes Patient has history of Cataracts Genitourinary Patient has history of End Stage Renal Disease - Was On Dialysis Musculoskeletal Patient has history of Osteoarthritis - Knees Hospitalization/Surgery History - Right T knee arthroplasty. otal Medical A Surgical History Notes nd Eyes Hx: Diabetic Retinopathy Hematologic/Lymphatic Hx: Blood Transfusions Cardiovascular Hx: Hashimoto's disease Endocrine Hx: Hyperparathyroidism secondary to renal;Hypothyroidism Diabetes Mellitus Type 2 (35 years) Genitourinary Hx: Of Right Kidney transplant Objective Constitutional No acute distress. Vitals  Time Taken: 8:09 AM, Height: 65 in, Weight: 165 lbs, BMI: 27.5, Temperature: 97.7 F, Pulse: 68 bpm, Respiratory Rate: 20 breaths/min, Blood Pressure: 129/62 mmHg. Respiratory Normal work of breathing on room air. General Notes: 04/28/2022: The wound is contracting nicely. It is a little bit dry on the surface. There is slough and eschar accumulation. No concern for infection. Integumentary (Hair, Skin) Wound #1 status is Open. Original cause of wound was Surgical Injury. The date acquired was: 01/24/2022. The wound has been in treatment 2 weeks. The wound is located on the Right Knee. The wound measures 3.8cm length x 0.6cm width x 0.1cm depth; 1.791cm^2 area and 0.179cm^3 volume. There is Fat Layer (Subcutaneous Tissue) exposed. There is no tunneling or undermining noted. There is a medium amount of serosanguineous drainage noted. The wound margin is distinct with the outline attached to the wound base. There is small (1-33%) pink granulation within the wound bed. There is a large (67-100%) amount of necrotic tissue within the wound bed including Eschar and Adherent Slough. The periwound skin appearance had no abnormalities noted for moisture. The periwound skin appearance had no  abnormalities noted for color. The periwound skin appearance exhibited: Scarring. Periwound temperature was noted as No Abnormality. Assessment Active Problems ICD-10 Unspecified open wound, right knee, subsequent encounter Denise Bush, Denise Bush ([id number]) [id number] Page 6 of 8 Disruption of external operation (surgical) wound, not elsewhere classified, subsequent encounter Type 2 diabetes mellitus with other skin ulcer Kidney transplant status Procedures Wound #1 Pre-procedure diagnosis of Wound #1 is a Dehisced Wound located on the Right Knee . There was a Excisional Skin/Subcutaneous Tissue Debridement with a total area of 2.28 sq cm performed by Fredirick Maudlin, MD. With the following  instrument(s): Curette to remove Non-Viable tissue/material. Material removed includes Eschar, Subcutaneous Tissue, and Slough after achieving pain control using Lidocaine 5% topical ointment. No specimens were taken. A time out was conducted at 08:25, prior to the start of the procedure. A Minimum amount of bleeding was controlled with Pressure. The procedure was tolerated well with a pain level of 0 throughout and a pain level of 0 following the procedure. Post Debridement Measurements: 3.8cm length x 0.6cm width x 0.1cm depth; 0.179cm^3 volume. Character of Wound/Ulcer Post Debridement is improved. Post procedure Diagnosis Wound #1: Same as Pre-Procedure General Notes: Scribed for Dr. Celine Ahr by J.Scotton. Plan Follow-up Appointments: Return Appointment in 1 week. - Dr. Celine Ahr Room 3 Anesthetic: (In clinic) Topical Lidocaine 5% applied to wound bed - Used in Clinic Bathing/ Shower/ Hygiene: Other Bathing/Shower/Hygiene Orders/Instructions: - May change wound every other day, if so desired, after bathing. Additional Orders / Instructions: Other: - Do Not put pressure on the right knee Home Health: Wound #1 Right Knee: New wound care orders this week; continue Home Health for wound care. May utilize formulary equivalent dressing for wound treatment orders unless otherwise specified. - Moistenen with Hydrogel to Hydrafera Blue Classic dressing Other Home Health Orders/Instructions: - [place] to perform wound care 1 x week for 9 weeks WOUND #1: - Knee Wound Laterality: Right Cleanser: Soap and Water Every Other Day/30 Days Discharge Instructions: May shower and wash wound with dial antibacterial soap and water prior to dressing change. Cleanser: Wound Cleanser (Generic) Every Other Day/30 Days Discharge Instructions: Cleanse the wound with wound cleanser prior to applying a clean dressing using gauze sponges, not tissue or cotton balls. Prim Dressing: Hydrofera Blue Ready Transfer  Foam, 4x5 (in/in) (Generic) Every Other Day/30 Days ary Discharge Instructions: Apply to wound bed , moisten with Hydrogel Secondary Dressing: Bordered Gauze, 4x4 in (Generic) Every Other Day/30 Days Discharge Instructions: Apply over primary dressing as directed. 04/28/2022: The wound is contracting nicely. It is a little bit dry on the surface. There is slough and eschar accumulation. No concern for infection. I used a curette to debride slough, eschar, and nonviable subcutaneous tissue from the wound. We will continue using the [facility], but moisten it with hydrogel, rather than saline. Follow-up in 1 week. Electronic Signature(s) Signed: 04/28/2022 8:52:35 AM By: Fredirick Maudlin MD FACS Entered By: Fredirick Maudlin on 04/28/2022 08:52:35 -------------------------------------------------------------------------------- HxROS Details Patient Name: Date of Service: Denise Bush. 04/28/2022 8:00 A M Medical Record Number: [id number] Patient Account Number: [id number] Date of Birth/Sex: Treating RN: 13-Jul-1950 (71 y.o. F) Primary Care Provider: Cletis Athens Other Clinician: Referring Provider: Treating Provider/Extender: Colvin Caroli in Treatment: 2 Denise Bush, Denise Bush ([id number]) [id number] Page 7 of 8 Information Obtained From Patient Eyes Medical History: Positive for: Cataracts Past Medical History Notes: Hx: Diabetic Retinopathy Hematologic/Lymphatic Medical History: Past Medical History Notes: Hx: Blood Transfusions Cardiovascular Medical History: Past  Medical History Notes: Hx: Hashimoto's disease Endocrine Medical History: Past Medical History Notes: Hx: Hyperparathyroidism secondary to renal;Hypothyroidism Diabetes Mellitus Type 2 (35 years) Genitourinary Medical History: Positive for: End Stage Renal Disease - Was On Dialysis Past Medical History Notes: Hx: Of Right Kidney  transplant Musculoskeletal Medical History: Positive for: Osteoarthritis - Knees HBO Extended History Items Eyes: Cataracts Immunizations Pneumococcal Vaccine: Received Pneumococcal Vaccination: Yes Received Pneumococcal Vaccination On or After 60th Birthday: Yes Implantable Devices Yes Hospitalization / Surgery History Type of Hospitalization/Surgery Right T knee arthroplasty otal Family and Social History Unknown History: Yes; Never smoker; Marital Status - Married; Alcohol Use: Never; Drug Use: No History; Caffeine Use: Rarely; Financial Concerns: No; Food, Clothing or Shelter Needs: No; Support System Lacking: No; Transportation Concerns: No Electronic Signature(s) Signed: 04/28/2022 10:54:20 AM By: Fredirick Maudlin MD FACS Entered By: Fredirick Maudlin on 04/28/2022 08:51:29 -------------------------------------------------------------------------------- SuperBill Details Patient Name: Date of Service: Denise Bush, Denise H. 04/28/2022 Denise Bush ([id number]) [id number] Page 8 of 8 Medical Record Number: [id number] Patient Account Number: [id number] Date of Birth/Sex: Treating RN: 08-Apr-1951 (71 y.o. F) Primary Care Provider: Cletis Athens Other Clinician: Referring Provider: Treating Provider/Extender: Colvin Caroli in Treatment: 2 Diagnosis Coding ICD-10 Codes Code Description S81.001D Unspecified open wound, right knee, subsequent encounter T81.31XD Disruption of external operation (surgical) wound, not elsewhere classified, subsequent encounter E11.622 Type 2 diabetes mellitus with other skin ulcer Z94.0 Kidney transplant status Facility Procedures : CPT4 Code: [id number] Description: 16384 - DEB SUBQ TISSUE 20 SQ CM/< ICD-10 Diagnosis Description S81.001D Unspecified open wound, right knee, subsequent encounter Modifier: Quantity: 1 Physician Procedures : CPT4 Code Description Modifier [id number] - WC PHYS  LEVEL 3 - EST PT 25 ICD-10 Diagnosis Description S81.001D Unspecified open wound, right knee, subsequent encounter T81.31XD Disruption of external operation (surgical) wound, not elsewhere  classified, subsequent encounter E11.622 Type 2 diabetes mellitus with other skin ulcer Z94.0 Kidney transplant status Quantity: 1 : [id number] 92330 - WC PHYS SUBQ TISS 20 SQ CM ICD-10 Diagnosis Description S81.001D Unspecified open wound, right knee, subsequent encounter Quantity: 1 Electronic Signature(s) Signed: 04/28/2022 8:52:52 AM By: Fredirick Maudlin MD FACS Entered By: Fredirick Maudlin on 04/28/2022 08:52:52

## 2022-04-28 NOTE — Progress Notes (Signed)
Denise Bush, Denise Bush ([id number]) [id number] Page 1 of 7 Visit Report for 04/28/2022 Arrival Information Details Patient Name: Date of Service: Denise Bush, Denise Bush 04/28/2022 8:00 A M Medical Record Number: [id number] Patient Account Number: [id number] Date of Birth/Sex: Treating RN: Jan 06, 1951 (71 y.o. F) Primary Care Johnanthony Wilden: Cletis [place] Other Clinician: Referring Donaldo Teegarden: Treating Arrie Zuercher/Extender: Colvin Caroli in Treatment: 2 Visit Information History Since Last Visit All ordered tests and consults were completed: No Patient Arrived: Ambulatory Added or deleted any medications: No Arrival Time: 08:08 Any new allergies or adverse reactions: No Accompanied By: spouse Had a fall or experienced change in No Transfer Assistance: None activities of daily living that may affect Patient Identification Verified: Yes risk of falls: Secondary Verification Process Completed: Yes Signs or symptoms of abuse/neglect since last visito No Hospitalized since last visit: No Implantable device outside of the clinic excluding No cellular tissue based products placed in the center since last visit: Pain Present Now: No Electronic Signature(s) Signed: 04/28/2022 9:12:32 AM By: Worthy Rancher Entered By: Worthy Rancher on 04/28/2022 08:09:01 -------------------------------------------------------------------------------- Encounter Discharge Information Details Patient Name: Date of Service: Denise Maxcy. 04/28/2022 8:00 A M Medical Record Number: [id number] Patient Account Number: [id number] Date of Birth/Sex: Treating RN: [birth year] (71 y.o. America Brown Primary Care Maxmillian Carsey: Cletis [place] Other Clinician: Referring Quierra Silverio: Treating Merrit Waugh/Extender: Colvin Caroli in Treatment: 2 Encounter Discharge Information Items Post Procedure Vitals Discharge Condition: Stable Temperature (F): 97.7 Ambulatory Status:  Ambulatory Pulse (bpm): 68 Discharge Destination: Home Respiratory Rate (breaths/min): 20 Transportation: Private Auto Blood Pressure (mmHg): 129/62 Accompanied By: spouse Schedule Follow-up Appointment: Yes Clinical Summary of Care: Patient Declined Electronic Signature(s) Signed: 04/28/2022 12:29:59 PM By: Dellie Catholic RN Entered By: Dellie Catholic on 04/28/2022 12:28:53 Denise Bush ([id number]) [id number] Page 2 of 7 -------------------------------------------------------------------------------- Lower Extremity Assessment Details Patient Name: Date of Service: Denise Bush, Denise Bush 04/28/2022 8:00 A M Medical Record Number: [id number] Patient Account Number: [id number] Date of Birth/Sex: Treating RN: 1951/02/08 (71 y.o. America Brown Primary Care Chandel Zaun: Cletis [place] Other Clinician: Referring Jassmine Vandruff: Treating Jawanda Passey/Extender: Colvin Caroli in Treatment: 2 Edema Assessment Assessed: [Left: No] [Right: No] [Left: Edema] [Right: :] Calf Left: Right: Point of Measurement: From Medial Instep 28 cm Ankle Left: Right: Point of Measurement: From Medial Instep 17.5 cm Vascular Assessment Pulses: Dorsalis Pedis Palpable: [Right:Yes] Electronic Signature(s) Signed: 04/28/2022 12:29:59 PM By: Dellie Catholic RN Entered By: Dellie Catholic on 04/28/2022 08:18:12 -------------------------------------------------------------------------------- Multi Wound Chart Details Patient Name: Date of Service: Denise Maxcy. 04/28/2022 8:00 A M Medical Record Number: [id number] Patient Account Number: [id number] Date of Birth/Sex: Treating RN: 11-12-1950 (71 y.o. F) Primary Care Zackerie Sara: Cletis [place] Other Clinician: Referring Evelyne Makepeace: Treating Chue Berkovich/Extender: Colvin Caroli in Treatment: 2 Vital Signs Height(in): 65 Pulse(bpm): 68 Weight(lbs): 165 Blood Pressure(mmHg): 129/62 Body  Mass Index(BMI): 27.5 Temperature(F): 97.7 Respiratory Rate(breaths/min): 20 [1:Photos:] [Bush/A:Bush/A] Right Knee Bush/A Bush/A Wound Location: Surgical Injury Bush/A Bush/A Wounding Event: Dehisced Wound Bush/A Bush/A Primary Etiology: Cataracts, End Stage Renal Disease, Bush/A Bush/A Comorbid History: Osteoarthritis 01/24/2022 Bush/A Bush/A Date Acquired: 2 Bush/A Bush/A Weeks of Treatment: Open Bush/A Bush/A Wound Status: No Bush/A Bush/A Wound Recurrence: 3.8x0.6x0.1 Bush/A Bush/A Measurements L x W x D (cm) 1.791 Bush/A Bush/A A (cm) : rea 0.179 Bush/A Bush/A Volume (cm) : 70.80% Bush/A Bush/A % Reduction in A rea: 92.70% Bush/A Bush/A % Reduction in Volume: Full Thickness Without Exposed Bush/A Bush/A Classification: Support Structures Medium  Bush/A Bush/A Exudate A mount: Serosanguineous Bush/A Bush/A Exudate Type: red, brown Bush/A Bush/A Exudate Color: Distinct, outline attached Bush/A Bush/A Wound Margin: Small (1-33%) Bush/A Bush/A Granulation A mount: Pink Bush/A Bush/A Granulation Quality: Large (67-100%) Bush/A Bush/A Necrotic A mount: Eschar, Adherent Slough Bush/A Bush/A Necrotic Tissue: Fat Layer (Subcutaneous Tissue): Yes Bush/A Bush/A Exposed Structures: Fascia: No Tendon: No Muscle: No Joint: No Bone: No Small (1-33%) Bush/A Bush/A Epithelialization: Debridement - Excisional Bush/A Bush/A Debridement: Pre-procedure Verification/Time Out 08:25 Bush/A Bush/A Taken: Lidocaine 5% topical ointment Bush/A Bush/A Pain Control: Necrotic/Eschar, Subcutaneous, Bush/A Bush/A Tissue Debrided: Slough Skin/Subcutaneous Tissue Bush/A Bush/A Level: 2.28 Bush/A Bush/A Debridement A (sq cm): rea Curette Bush/A Bush/A Instrument: Minimum Bush/A Bush/A Bleeding: Pressure Bush/A Bush/A Hemostasis Achieved: 0 Bush/A Bush/A Procedural Pain: 0 Bush/A Bush/A Post Procedural Pain: Debridement Treatment Response: Procedure was tolerated well Bush/A Bush/A Post Debridement Measurements L x 3.8x0.6x0.1 Bush/A Bush/A W x D (cm) 0.179 Bush/A Bush/A Post Debridement Volume: (cm) Scarring: Yes Bush/A Bush/A Periwound Skin Texture: No Abnormalities Noted Bush/A  Bush/A Periwound Skin Moisture: No Abnormalities Noted Bush/A Bush/A Periwound Skin Color: No Abnormality Bush/A Bush/A Temperature: Debridement Bush/A Bush/A Procedures Performed: Treatment Notes Electronic Signature(s) Signed: 04/28/2022 8:50:43 AM By: Fredirick Maudlin MD FACS Entered By: Fredirick Maudlin on 04/28/2022 08:50:43 -------------------------------------------------------------------------------- Multi-Disciplinary Care Plan Details Patient Name: Date of Service: Denise Maxcy. 04/28/2022 8:00 A M Medical Record Number: [id number] Patient Account Number: [id number] Date of Birth/Sex: Treating RN: Apr 22, 1951 (71 y.o. America Brown Primary Care Felishia Wartman: Cletis [place] Other Clinician: Referring Gwendoline Judy: Treating Meshulem Onorato/Extender: Colvin Caroli in Treatment: [address] Denise Bush, Denise Bush ([id number]) [id number] Page 4 of 7 Wound/Skin Impairment Nursing Diagnoses: Impaired tissue integrity Goals: Patient/caregiver will verbalize understanding of skin care regimen Date Initiated: 04/11/2022 Target Resolution Date: 06/29/2022 Goal Status: Active Interventions: Assess ulceration(s) every visit Treatment Activities: Skin care regimen initiated : 04/11/2022 Notes: Electronic Signature(s) Signed: 04/28/2022 12:29:59 PM By: Dellie Catholic RN Entered By: Dellie Catholic on 04/28/2022 12:26:45 -------------------------------------------------------------------------------- Pain Assessment Details Patient Name: Date of Service: Denise Maxcy 04/28/2022 8:00 A M Medical Record Number: [id number] Patient Account Number: [id number] Date of Birth/Sex: Treating RN: 1951/05/01 (71 y.o. F) Primary Care Ori Trejos: Cletis [place] Other Clinician: Referring Malakye Nolden: Treating Maze Corniel/Extender: Colvin Caroli in Treatment: 2 Active Problems Location of Pain Severity and Description of Pain Patient Has Paino  No Site Locations Pain Management and Medication Current Pain Management: Electronic Signature(s) Signed: 04/28/2022 9:12:32 AM By: Worthy Rancher Entered By: Worthy Rancher on 04/28/2022 08:09:38 Denise Bush ([id number]) [id number] Page 5 of 7 -------------------------------------------------------------------------------- Patient/Caregiver Education Details Patient Name: Date of Service: Denise Bush, Denise H. 12/29/2023andnbsp8:00 A M Medical Record Number: [id number] Patient Account Number: [id number] Date of Birth/Gender: Treating RN: Jan 17, 1951 (71 y.o. America Brown Primary Care Physician: Cletis [place] Other Clinician: Referring Physician: Treating Physician/Extender: Colvin Caroli in Treatment: 2 Education Assessment Education Provided To: Patient Education Topics Provided Wound/Skin Impairment: Methods: Explain/Verbal Responses: Return demonstration correctly Electronic Signature(s) Signed: 04/28/2022 12:29:59 PM By: Dellie Catholic RN Entered By: Dellie Catholic on 04/28/2022 12:27:20 -------------------------------------------------------------------------------- Wound Assessment Details Patient Name: Date of Service: Denise Maxcy 04/28/2022 8:00 A M Medical Record Number: [id number] Patient Account Number: [id number] Date of Birth/Sex: Treating RN: 1950-06-08 (71 y.o. America Brown Primary Care Traylen Eckels: Cletis [place] Other Clinician: Referring Sneijder Bernards: Treating Shaquela Weichert/Extender: Colvin Caroli in Treatment: 2 Wound Status Wound Number: 1 Primary Etiology: Dehisced Wound Wound Location:  Right Knee Wound Status: Open Wounding Event: Surgical Injury Comorbid History: Cataracts, End Stage Renal Disease, Osteoarthritis Date Acquired: 01/24/2022 Weeks Of Treatment: 2 Clustered Wound: No Photos Wound Measurements Length: (cm) 3.8 Width: (cm) 0.6 Denise Bush, Denise Bush  ([id number]) Depth: (cm) 0 Area: (cm) Volume: (cm) % Reduction in Area: 70.8% % Reduction in Volume: 92.7% [id number] Page 6 of 7 .1 Epithelialization: Small (1-33%) 1.791 Tunneling: No 0.179 Undermining: No Wound Description Classification: Full Thickness Without Exposed Support Structures Wound Margin: Distinct, outline attached Exudate Amount: Medium Exudate Type: Serosanguineous Exudate Color: red, brown Foul Odor After Cleansing: No Slough/Fibrino Yes Wound Bed Granulation Amount: Small (1-33%) Exposed Structure Granulation Quality: Pink Fascia Exposed: No Necrotic Amount: Large (67-100%) Fat Layer (Subcutaneous Tissue) Exposed: Yes Necrotic Quality: Eschar, Adherent Slough Tendon Exposed: No Muscle Exposed: No Joint Exposed: No Bone Exposed: No Periwound Skin Texture Texture Color No Abnormalities Noted: No No Abnormalities Noted: Yes Scarring: Yes Temperature / Pain Temperature: No Abnormality Moisture No Abnormalities Noted: Yes Treatment Notes Wound #1 (Knee) Wound Laterality: Right Cleanser Soap and Water Discharge Instruction: May shower and wash wound with dial antibacterial soap and water prior to dressing change. Wound Cleanser Discharge Instruction: Cleanse the wound with wound cleanser prior to applying a clean dressing using gauze sponges, not tissue or cotton balls. Peri-Wound Care Topical Primary Dressing Hydrofera Blue Ready Transfer Foam, 4x5 (in/in) Discharge Instruction: Apply to wound bed , moisten with Hydrogel Secondary Dressing Bordered Gauze, 4x4 in Discharge Instruction: Apply over primary dressing as directed. Secured With Compression Wrap Compression Stockings Environmental education officer) Signed: 04/28/2022 12:29:59 PM By: Dellie Catholic RN Entered By: Dellie Catholic on 04/28/2022 08:33:18 -------------------------------------------------------------------------------- Vitals Details Patient  Name: Date of Service: Denise Maxcy. 04/28/2022 8:00 A M Medical Record Number: [id number] Patient Account Number: [id number] Date of Birth/Sex: Treating RN: [birth year] (71 y.o. F) Primary Care Donyell Ding: Cletis [place] Other Clinician: BETTYJANE, Denise Bush ([id number]) [id number] Page 7 of 7 Referring Haider Hornaday: Treating Joandy Burget/Extender: Colvin Caroli in Treatment: 2 Vital Signs Time Taken: 08:09 Temperature (F): 97.7 Height (in): 65 Pulse (bpm): 68 Weight (lbs): 165 Respiratory Rate (breaths/min): 20 Body Mass Index (BMI): 27.5 Blood Pressure (mmHg): 129/62 Reference Range: 80 - 120 mg / dl Electronic Signature(s) Signed: 04/28/2022 9:12:32 AM By: Worthy Rancher Entered By: Worthy Rancher on 04/28/2022 08:09:29

## 2022-04-29 LAB — BK QUANT PCR (PLASMA/SERUM)
BK Quantitaion PCR: 44 IU/mL
Log10 BK Qn PCR: 1.643 log10 IU/mL

## 2022-05-01 LAB — SIROLIMUS LEVEL: Sirolimus (Rapamycin): 5.6 ng/mL (ref 3.0–20.0)

## 2022-05-08 ENCOUNTER — Encounter (HOSPITAL_BASED_OUTPATIENT_CLINIC_OR_DEPARTMENT_OTHER): Payer: Medicare PPO | Attending: General Surgery | Admitting: General Surgery

## 2022-05-08 DIAGNOSIS — H548 Legal blindness, as defined in USA: Secondary | ICD-10-CM | POA: Diagnosis not present

## 2022-05-08 DIAGNOSIS — Z96651 Presence of right artificial knee joint: Secondary | ICD-10-CM | POA: Insufficient documentation

## 2022-05-08 DIAGNOSIS — Z940 Kidney transplant status: Secondary | ICD-10-CM | POA: Insufficient documentation

## 2022-05-08 DIAGNOSIS — T8131XA Disruption of external operation (surgical) wound, not elsewhere classified, initial encounter: Secondary | ICD-10-CM | POA: Diagnosis present

## 2022-05-08 DIAGNOSIS — E119 Type 2 diabetes mellitus without complications: Secondary | ICD-10-CM | POA: Insufficient documentation

## 2022-05-08 DIAGNOSIS — S81001A Unspecified open wound, right knee, initial encounter: Secondary | ICD-10-CM | POA: Insufficient documentation

## 2022-05-08 DIAGNOSIS — Y831 Surgical operation with implant of artificial internal device as the cause of abnormal reaction of the patient, or of later complication, without mention of misadventure at the time of the procedure: Secondary | ICD-10-CM | POA: Diagnosis not present

## 2022-05-09 NOTE — Progress Notes (Signed)
[name], [name] ([id number]) [id number] Page 1 of 8 Visit Report for 05/08/2022 Chief Complaint Document Details Patient Name: Date of Service: Denise, Bush 05/08/2022 8:45 A M Medical Record Number: [id number] Patient Account Number: [id number] Date of Birth/Sex: Treating RN: March 30, 1951 (72 y.o. F) Primary Care Provider: Cletis Athens Other Clinician: Referring Provider: Treating Provider/Extender: Colvin Caroli in Treatment: 3 Information Obtained from: Patient Chief Complaint 04/11/2022; patient is here for a nonhealing surgical wound on her right knee Electronic Signature(s) Signed: 05/08/2022 9:12:22 AM By: Fredirick Maudlin MD FACS Entered By: Fredirick Maudlin on 05/08/2022 09:12:22 -------------------------------------------------------------------------------- Debridement Details Patient Name: Date of Service: Denise Bush 05/08/2022 8:45 A M Medical Record Number: [id number] Patient Account Number: [id number] Date of Birth/Sex: Treating RN: 1950/11/29 (72 y.o. Marta Lamas Primary Care Provider: Cletis Athens Other Clinician: Referring Provider: Treating Provider/Extender: Colvin Caroli in Treatment: 3 Debridement Performed for Assessment: Wound #1 Right Knee Performed By: Physician Fredirick Maudlin, MD Debridement Type: Debridement Level of Consciousness (Pre-procedure): Awake and Alert Pre-procedure Verification/Time Out Yes - 09:04 Taken: Start Time: 09:05 Pain Control: Lidocaine 5% topical ointment T Area Debrided (L x W): otal 3 (cm) x 0.5 (cm) = 1.5 (cm) Tissue and other material debrided: Non-Viable, Eschar, Slough, Slough Level: Non-Viable Tissue Debridement Description: Selective/Open Wound Instrument: Curette Bleeding: Minimum Hemostasis Achieved: Pressure Response to Treatment: Procedure was tolerated well Level of Consciousness (Post- Awake and Alert procedure): Post  Debridement Measurements of Total Wound Length: (cm) 3 Width: (cm) 0.5 Depth: (cm) 0.1 Volume: (cm) 0.118 Character of Wound/Ulcer Post Debridement: Requires Further Debridement Post Procedure Diagnosis Same as Denise, Bush ([id number]) [id number] Page 2 of 8 Notes Scribed for Dr. Celine Ahr by Blanche East, RN Electronic Signature(s) Signed: 05/08/2022 11:45:20 AM By: Fredirick Maudlin MD FACS Signed: 05/08/2022 5:23:33 PM By: Blanche East RN Entered By: Blanche East on 05/08/2022 09:11:45 -------------------------------------------------------------------------------- HPI Details Patient Name: Date of Service: Denise Bush 05/08/2022 8:45 A M Medical Record Number: [id number] Patient Account Number: [id number] Date of Birth/Sex: Treating RN: August 22, 1950 (72 y.o. F) Primary Care Provider: Cletis Athens Other Clinician: Referring Provider: Treating Provider/Extender: Colvin Caroli in Treatment: 3 History of Present Illness HPI Description: ADMISSION 04/11/2022 This is a pleasant 72 year old woman accompanied by her husband. She has a history of type 2 diabetes reasonably well-controlled status post kidney transplant in 2019. She had a right total knee replacement towards the end of September of this year. According to the patient this never really healed they used surgical glue for adhesion that was not sutured. She simply says it never closed. This has been followed by orthopedic surgery and they have referred her here apparently she has been using Vaseline gauze. The wound is 6 x 1.3 x 0.4 and completely eschar covered Past medical history includes type 2 diabetes with a kidney transplant in 2019. She had a right total knee replacement on 01/21/2022. She has a history of SIADH, hypothyroidism she is legally blind. ABI in our clinic was 1.16 04/18/2022: Compared to last week's measurements and description, the wound  is a little bit smaller today. It is also quite a bit cleaner. They have been using Hydrofera Blue with a foam border dressing. 04/28/2022: The wound is contracting nicely. It is a little bit dry on the surface. There is slough and eschar accumulation. No concern for infection. 05/08/2022: The wound is smaller again today. There is slough and eschar on the surface. Moisture balance  is better. Electronic Signature(s) Signed: 05/08/2022 9:12:59 AM By: Fredirick Maudlin MD FACS Entered By: Fredirick Maudlin on 05/08/2022 09:12:59 -------------------------------------------------------------------------------- Physical Exam Details Patient Name: Date of Service: Denise Bush, Denise H. 05/08/2022 8:45 A M Medical Record Number: [id number] Patient Account Number: [id number] Date of Birth/Sex: Treating RN: [birth year] (72 y.o. F) Primary Care Provider: Cletis [place] Other Clinician: Referring Provider: Treating Provider/Extender: Colvin Caroli in Treatment: 3 Constitutional Slightly hypertensive. . . . no acute distress. Respiratory Normal work of breathing on room air. Notes Denise, Bush ([id number]) [id number] Page 3 of 8 05/08/2022: The wound is smaller again today. There is slough and eschar on the surface. Moisture balance is better. Electronic Signature(s) Signed: 05/08/2022 9:13:36 AM By: Fredirick Maudlin MD FACS Entered By: Fredirick Maudlin on 05/08/2022 09:13:36 -------------------------------------------------------------------------------- Physician Orders Details Patient Name: Date of Service: Denise Bush 05/08/2022 8:45 A M Medical Record Number: [id number] Patient Account Number: [id number] Date of Birth/Sex: Treating RN: Mar 13, 1951 (72 y.o. Marta Lamas Primary Care Provider: Cletis Athens Other Clinician: Referring Provider: Treating Provider/Extender: Colvin Caroli in Treatment: 3 Verbal / Phone  Orders: No Diagnosis Coding ICD-10 Coding Code Description S81.001D Unspecified open wound, right knee, subsequent encounter T81.31XD Disruption of external operation (surgical) wound, not elsewhere classified, subsequent encounter E11.622 Type 2 diabetes mellitus with other skin ulcer Z94.0 Kidney transplant status Follow-up Appointments ppointment in 1 week. - Dr. Celine Ahr Room 3 Return A Anesthetic (In clinic) Topical Lidocaine 5% applied to wound bed - Used in Clinic Bathing/ Shower/ Hygiene Other Bathing/Shower/Hygiene Orders/Instructions: - May change wound every other day, if so desired, after bathing. Additional Orders / Instructions Other: - Do Not put pressure on the right knee Home Health Wound #1 Right Knee No change in wound care orders this week; continue Home Health for wound care. May utilize formulary equivalent dressing for wound treatment orders unless otherwise specified. - Moistenen with Hydrogel to Hydrafera Blue Classic dressing Other Home Health Orders/Instructions: - [place] to perform wound care 1 x week for 9 weeks Wound Treatment Wound #1 - Knee Wound Laterality: Right Cleanser: Soap and Water Every Other Day/30 Days Discharge Instructions: May shower and wash wound with dial antibacterial soap and water prior to dressing change. Cleanser: Wound Cleanser (Generic) Every Other Day/30 Days Discharge Instructions: Cleanse the wound with wound cleanser prior to applying a clean dressing using gauze sponges, not tissue or cotton balls. Prim Dressing: Hydrofera Blue Ready Transfer Foam, 4x5 (in/in) (Generic) Every Other Day/30 Days ary Discharge Instructions: Apply to wound bed , moisten with Hydrogel Secondary Dressing: Bordered Gauze, 4x4 in (Generic) Every Other Day/30 Days Discharge Instructions: Apply over primary dressing as directed. Electronic Signature(s) Signed: 05/08/2022 11:45:20 AM By: Fredirick Maudlin MD FACS Signed: 05/08/2022 5:23:33 PM  By: Blanche East RN Previous Signature: 05/08/2022 8:52:59 AM Version By: Blanche East RN Entered By: Blanche East on 05/08/2022 09:17:04 Sheral Apley ([id number]) [id number] Page 4 of 8 -------------------------------------------------------------------------------- Problem List Details Patient Name: Date of Service: Denise Bush, Denise Bush 05/08/2022 8:45 A M Medical Record Number: [id number] Patient Account Number: [id number] Date of Birth/Sex: Treating RN: 1950/11/27 (72 y.o. F) Primary Care Provider: Cletis Athens Other Clinician: Referring Provider: Treating Provider/Extender: Colvin Caroli in Treatment: 3 Active Problems ICD-10 Encounter Code Description Active Date MDM Diagnosis S81.001D Unspecified open wound, right knee, subsequent encounter 04/11/2022 No Yes T81.31XD Disruption of external operation (surgical) wound, not elsewhere classified, 04/11/2022 No Yes subsequent encounter E11.622 Type  2 diabetes mellitus with other skin ulcer 04/11/2022 No Yes Z94.0 Kidney transplant status 04/11/2022 No Yes Inactive Problems Resolved Problems Electronic Signature(s) Signed: 05/08/2022 9:12:07 AM By: Fredirick Maudlin MD FACS Entered By: Fredirick Maudlin on 05/08/2022 09:12:07 -------------------------------------------------------------------------------- Progress Note Details Patient Name: Date of Service: Denise Bush, Denise H. 05/08/2022 8:45 A M Medical Record Number: [id number] Patient Account Number: [id number] Date of Birth/Sex: Treating RN: 03/05/1951 (72 y.o. F) Primary Care Provider: Cletis Athens Other Clinician: Referring Provider: Treating Provider/Extender: Colvin Caroli in Treatment: 3 Subjective Chief Complaint Information obtained from Patient 04/11/2022; patient is here for a nonhealing surgical wound on her right knee History of Present Illness (HPI) ADMISSION 04/11/2022 This is a  pleasant 72 year old woman accompanied by her husband. She has a history of type 2 diabetes reasonably well-controlled status post kidney ANWAR, CRILL ([id number]) [id number] Page 5 of 8 transplant in 2019. She had a right total knee replacement towards the end of September of this year. According to the patient this never really healed they used surgical glue for adhesion that was not sutured. She simply says it never closed. This has been followed by orthopedic surgery and they have referred her here apparently she has been using Vaseline gauze. The wound is 6 x 1.3 x 0.4 and completely eschar covered Past medical history includes type 2 diabetes with a kidney transplant in 2019. She had a right total knee replacement on 01/21/2022. She has a history of SIADH, hypothyroidism she is legally blind. ABI in our clinic was 1.16 04/18/2022: Compared to last week's measurements and description, the wound is a little bit smaller today. It is also quite a bit cleaner. They have been using Hydrofera Blue with a foam border dressing. 04/28/2022: The wound is contracting nicely. It is a little bit dry on the surface. There is slough and eschar accumulation. No concern for infection. 05/08/2022: The wound is smaller again today. There is slough and eschar on the surface. Moisture balance is better. Patient History Information obtained from Patient. Family History Unknown History. Social History Never smoker, Marital Status - Married, Alcohol Use - Never, Drug Use - No History, Caffeine Use - Rarely. Medical History Eyes Patient has history of Cataracts Genitourinary Patient has history of End Stage Renal Disease - Was On Dialysis Musculoskeletal Patient has history of Osteoarthritis - Knees Hospitalization/Surgery History - Right T knee arthroplasty. otal Medical A Surgical History Notes nd Eyes Hx: Diabetic Retinopathy Hematologic/Lymphatic Hx: Blood  Transfusions Cardiovascular Hx: Hashimoto's disease Endocrine Hx: Hyperparathyroidism secondary to renal;Hypothyroidism Diabetes Mellitus Type 2 (35 years) Genitourinary Hx: Of Right Kidney transplant Objective Constitutional Slightly hypertensive. no acute distress. Vitals Time Taken: 8:38 AM, Height: 65 in, Weight: 165 lbs, BMI: 27.5, Temperature: 98.4 F, Pulse: 63 bpm, Respiratory Rate: 16 breaths/min, Blood Pressure: 143/62 mmHg. Respiratory Normal work of breathing on room air. General Notes: 05/08/2022: The wound is smaller again today. There is slough and eschar on the surface. Moisture balance is better. Integumentary (Hair, Skin) Wound #1 status is Open. Original cause of wound was Surgical Injury. The date acquired was: 01/24/2022. The wound has been in treatment 3 weeks. The wound is located on the Right Knee. The wound measures 3cm length x 0.5cm width x 0.2cm depth; 1.178cm^2 area and 0.236cm^3 volume. There is Fat Layer (Subcutaneous Tissue) exposed. There is no tunneling or undermining noted. There is a medium amount of serosanguineous drainage noted. The wound margin is distinct with the outline attached to the wound base. There  is small (1-33%) pink granulation within the wound bed. There is a large (67-100%) amount of necrotic tissue within the wound bed including Eschar and Adherent Slough. The periwound skin appearance had no abnormalities noted for moisture. The periwound skin appearance had no abnormalities noted for color. The periwound skin appearance exhibited: Scarring. Periwound temperature was noted as No Abnormality. Assessment Active Problems ICD-10 Denise Bush, Denise Bush ([id number]) [id number] Page 6 of 8 Unspecified open wound, right knee, subsequent encounter Disruption of external operation (surgical) wound, not elsewhere classified, subsequent encounter Type 2 diabetes mellitus with other skin ulcer Kidney transplant  status Procedures Wound #1 Pre-procedure diagnosis of Wound #1 is a Dehisced Wound located on the Right Knee . There was a Selective/Open Wound Non-Viable Tissue Debridement with a total area of 1.5 sq cm performed by Fredirick Maudlin, MD. With the following instrument(s): Curette to remove Non-Viable tissue/material. Material removed includes Eschar and Slough and after achieving pain control using Lidocaine 5% topical ointment. No specimens were taken. A time out was conducted at 09:04, prior to the start of the procedure. A Minimum amount of bleeding was controlled with Pressure. The procedure was tolerated well. Post Debridement Measurements: 3cm length x 0.5cm width x 0.1cm depth; 0.118cm^3 volume. Character of Wound/Ulcer Post Debridement requires further debridement. Post procedure Diagnosis Wound #1: Same as Pre-Procedure General Notes: Scribed for Dr. Celine Ahr by Blanche East, RN. Plan Follow-up Appointments: Return Appointment in 1 week. - Dr. Celine Ahr Room 3 Anesthetic: (In clinic) Topical Lidocaine 5% applied to wound bed - Used in Clinic Bathing/ Shower/ Hygiene: Other Bathing/Shower/Hygiene Orders/Instructions: - May change wound every other day, if so desired, after bathing. Additional Orders / Instructions: Other: - Do Not put pressure on the right knee Home Health: Wound #1 Right Knee: New wound care orders this week; continue Home Health for wound care. May utilize formulary equivalent dressing for wound treatment orders unless otherwise specified. - Moistenen with Hydrogel to Hydrafera Blue Classic dressing Other Home Health Orders/Instructions: - [place] to perform wound care 1 x week for 9 weeks WOUND #1: - Knee Wound Laterality: Right Cleanser: Soap and Water Every Other Day/30 Days Discharge Instructions: May shower and wash wound with dial antibacterial soap and water prior to dressing change. Cleanser: Wound Cleanser (Generic) Every Other Day/30  Days Discharge Instructions: Cleanse the wound with wound cleanser prior to applying a clean dressing using gauze sponges, not tissue or cotton balls. Prim Dressing: Hydrofera Blue Ready Transfer Foam, 4x5 (in/in) (Generic) Every Other Day/30 Days ary Discharge Instructions: Apply to wound bed , moisten with Hydrogel Secondary Dressing: Bordered Gauze, 4x4 in (Generic) Every Other Day/30 Days Discharge Instructions: Apply over primary dressing as directed. 05/08/2022: The wound is smaller again today. There is slough and eschar on the surface. Moisture balance is better. I used a curette to debride slough and eschar from the wound. We will continue Hydrofera Blue classic moistened with hydrogel. Follow-up in 1 week. Electronic Signature(s) Signed: 05/08/2022 9:14:17 AM By: Fredirick Maudlin MD FACS Entered By: Fredirick Maudlin on 05/08/2022 09:14:17 -------------------------------------------------------------------------------- HxROS Details Patient Name: Date of Service: Denise Bush 05/08/2022 8:45 A M Medical Record Number: [id number] Patient Account Number: [id number] Date of Birth/Sex: Treating RN: December 10, 1950 (72 y.o. F) Primary Care Provider: Cletis Athens Other Clinician: Referring Provider: Treating Provider/Extender: Colvin Caroli in Treatment: [place], [place] ([id number]) [id number] Page 7 of 8 Patient Eyes Medical History: Positive for: Cataracts Past Medical History Notes: Hx: Diabetic  Retinopathy Hematologic/Lymphatic Medical History: Past Medical History Notes: Hx: Blood Transfusions Cardiovascular Medical History: Past Medical History Notes: Hx: Hashimoto's disease Endocrine Medical History: Past Medical History Notes: Hx: Hyperparathyroidism secondary to renal;Hypothyroidism Diabetes Mellitus Type 2 (35 years) Genitourinary Medical History: Positive for: End Stage Renal Disease  - Was On Dialysis Past Medical History Notes: Hx: Of Right Kidney transplant Musculoskeletal Medical History: Positive for: Osteoarthritis - Knees HBO Extended History Items Eyes: Cataracts Immunizations Pneumococcal Vaccine: Received Pneumococcal Vaccination: Yes Received Pneumococcal Vaccination On or After 60th Birthday: Yes Implantable Devices Yes Hospitalization / Surgery History Type of Hospitalization/Surgery Right T knee arthroplasty otal Family and Social History Unknown History: Yes; Never smoker; Marital Status - Married; Alcohol Use: Never; Drug Use: No History; Caffeine Use: Rarely; Financial Concerns: No; Food, Clothing or Shelter Needs: No; Support System Lacking: No; Transportation Concerns: No Electronic Signature(s) Signed: 05/08/2022 11:45:20 AM By: Fredirick Maudlin MD FACS Entered By: Fredirick Maudlin on 05/08/2022 09:13:03 -------------------------------------------------------------------------------- SuperBill Details Patient Name: Date of Service: Denise Bush 05/08/2022 Medical Record Number: [id number] Patient Account Number: [id number] Denise Bush, Denise Bush ([id number]) [id number] Page 8 of 8 Date of Birth/Sex: Treating RN: 12-May-1950 (72 y.o. F) Primary Care Provider: Cletis Athens Other Clinician: Referring Provider: Treating Provider/Extender: Colvin Caroli in Treatment: 3 Diagnosis Coding ICD-10 Codes Code Description S81.001D Unspecified open wound, right knee, subsequent encounter T81.31XD Disruption of external operation (surgical) wound, not elsewhere classified, subsequent encounter E11.622 Type 2 diabetes mellitus with other skin ulcer Z94.0 Kidney transplant status Facility Procedures : CPT4 Code: [id number] Description: 36644 - DEBRIDE WOUND 1ST 20 SQ CM OR < ICD-10 Diagnosis Description S81.001D Unspecified open wound, right knee, subsequent encounter T81.31XD Disruption of external  operation (surgical) wound, not elsewhere classified, subs Modifier: equent encounter Quantity: 1 Physician Procedures : CPT4 Code Description Modifier [id number] - WC PHYS LEVEL 3 - EST PT 25 ICD-10 Diagnosis Description S81.001D Unspecified open wound, right knee, subsequent encounter T81.31XD Disruption of external operation (surgical) wound, not elsewhere  classified, subsequent encounter E11.622 Type 2 diabetes mellitus with other skin ulcer Z94.0 Kidney transplant status Quantity: 1 : [id number] - WC PHYS DEBR WO ANESTH 20 SQ CM ICD-10 Diagnosis Description S81.001D Unspecified open wound, right knee, subsequent encounter T81.31XD Disruption of external operation (surgical) wound, not elsewhere classified, subsequent encounter Quantity: 1 Electronic Signature(s) Signed: 05/08/2022 9:14:36 AM By: Fredirick Maudlin MD FACS Entered By: Fredirick Maudlin on 05/08/2022 09:14:36

## 2022-05-09 NOTE — Progress Notes (Signed)
LATRESE, CAROLAN ([id number]) [id number] Page 1 of 7 Visit Report for 05/08/2022 Arrival Information Details Patient Name: Date of Service: OLAMAE, FERRARA 05/08/2022 8:45 A M Medical Record Number: [id number] Patient Account Number: [id number] Date of Birth/Sex: Treating RN: 12/25/1950 (72 y.o. Iver Nestle, Jamie Primary Care Kemper Hochman: Cletis [place] Other Clinician: Referring Ponce Skillman: Treating Shakura Cowing/Extender: Colvin Caroli in Treatment: 3 Visit Information History Since Last Visit Added or deleted any medications: No Patient Arrived: Ambulatory Any new allergies or adverse reactions: No Arrival Time: 08:38 Had a fall or experienced change in No Accompanied By: husband activities of daily living that may affect Transfer Assistance: None risk of falls: Patient Identification Verified: Yes Signs or symptoms of abuse/neglect since last visito No Secondary Verification Process Completed: Yes Hospitalized since last visit: No Patient Requires Transmission-Based Precautions: No Implantable device outside of the clinic excluding No Patient Has Alerts: No cellular tissue based products placed in the center since last visit: Has Dressing in Place as Prescribed: Yes Pain Present Now: No Electronic Signature(s) Signed: 05/08/2022 5:23:33 PM By: Blanche East RN Entered By: Blanche East on 05/08/2022 08:38:46 -------------------------------------------------------------------------------- Encounter Discharge Information Details Patient Name: Date of Service: Joline Maxcy 05/08/2022 8:45 A M Medical Record Number: [id number] Patient Account Number: [id number] Date of Birth/Sex: Treating RN: 1950-07-02 (72 y.o. Marta Lamas Primary Care Fusaye Wachtel: Cletis [place] Other Clinician: Referring Lumen Brinlee: Treating Yuriy Cui/Extender: Colvin Caroli in Treatment: 3 Encounter Discharge Information Items Post Procedure  Vitals Discharge Condition: Stable Temperature (F): 98.4 Ambulatory Status: Ambulatory Pulse (bpm): 63 Discharge Destination: Home Respiratory Rate (breaths/min): 16 Transportation: Private Auto Blood Pressure (mmHg): 143/62 Accompanied By: husband Schedule Follow-up Appointment: Yes Clinical Summary of Care: Electronic Signature(s) Signed: 05/08/2022 5:23:33 PM By: Blanche East RN Entered By: Blanche East on 05/08/2022 09:13:34 Sheral Apley ([id number]) [id number] Page 2 of 7 -------------------------------------------------------------------------------- Lower Extremity Assessment Details Patient Name: Date of Service: AYSLIN, KUNDERT 05/08/2022 8:45 A M Medical Record Number: [id number] Patient Account Number: [id number] Date of Birth/Sex: Treating RN: 1950/07/22 (72 y.o. Marta Lamas Primary Care Danel Studzinski: Cletis [place] Other Clinician: Referring Merari Pion: Treating Reha Martinovich/Extender: Colvin Caroli in Treatment: 3 Edema Assessment Assessed: [Left: No] [Right: No] [Left: Edema] [Right: :] Calf Left: Right: Point of Measurement: From Medial Instep 28.4 cm Ankle Left: Right: Point of Measurement: From Medial Instep 18.5 cm Vascular Assessment Pulses: Dorsalis Pedis Palpable: [Right:Yes] Electronic Signature(s) Signed: 05/08/2022 5:23:33 PM By: Blanche East RN Entered By: Blanche East on 05/08/2022 08:44:27 -------------------------------------------------------------------------------- Multi Wound Chart Details Patient Name: Date of Service: Joline Maxcy 05/08/2022 8:45 A M Medical Record Number: [id number] Patient Account Number: [id number] Date of Birth/Sex: Treating RN: 1950-11-13 (72 y.o. F) Primary Care Quindarius Cabello: Cletis [place] Other Clinician: Referring Asaph Serena: Treating Dinita Migliaccio/Extender: Colvin Caroli in Treatment: 3 Vital Signs Height(in): 65 Pulse(bpm):  63 Weight(lbs): 165 Blood Pressure(mmHg): 143/62 Body Mass Index(BMI): 27.5 Temperature(F): 98.4 Respiratory Rate(breaths/min): 16 [1:Photos:] [N/A:N/A] Right Knee N/A N/A Wound Location: Surgical Injury N/A N/A Wounding Event: Dehisced Wound N/A N/A Primary Etiology: Cataracts, End Stage Renal Disease, N/A N/A Comorbid History: Osteoarthritis 01/24/2022 N/A N/A Date Acquired: 3 N/A N/A Weeks of Treatment: Open N/A N/A Wound Status: No N/A N/A Wound Recurrence: 3x0.5x0.2 N/A N/A Measurements L x W x D (cm) 1.178 N/A N/A A (cm) : rea 0.236 N/A N/A Volume (cm) : 80.80% N/A N/A % Reduction in A rea: 90.40% N/A N/A % Reduction in Volume: Full  Thickness Without Exposed N/A N/A Classification: Support Structures Medium N/A N/A Exudate A mount: Serosanguineous N/A N/A Exudate Type: red, brown N/A N/A Exudate Color: Distinct, outline attached N/A N/A Wound Margin: Small (1-33%) N/A N/A Granulation A mount: Pink N/A N/A Granulation Quality: Large (67-100%) N/A N/A Necrotic A mount: Eschar, Adherent Slough N/A N/A Necrotic Tissue: Fat Layer (Subcutaneous Tissue): Yes N/A N/A Exposed Structures: Fascia: No Tendon: No Muscle: No Joint: No Bone: No Small (1-33%) N/A N/A Epithelialization: Debridement - Selective/Open Wound N/A N/A Debridement: Pre-procedure Verification/Time Out 09:04 N/A N/A Taken: Lidocaine 5% topical ointment N/A N/A Pain Control: Necrotic/Eschar, Slough N/A N/A Tissue Debrided: Non-Viable Tissue N/A N/A Level: 1.5 N/A N/A Debridement A (sq cm): rea Curette N/A N/A Instrument: Minimum N/A N/A Bleeding: Pressure N/A N/A Hemostasis A chieved: Procedure was tolerated well N/A N/A Debridement Treatment Response: 3x0.5x0.1 N/A N/A Post Debridement Measurements L x W x D (cm) 0.118 N/A N/A Post Debridement Volume: (cm) Scarring: Yes N/A N/A Periwound Skin Texture: No Abnormalities Noted N/A N/A Periwound Skin  Moisture: No Abnormalities Noted N/A N/A Periwound Skin Color: No Abnormality N/A N/A Temperature: Debridement N/A N/A Procedures Performed: Treatment Notes Electronic Signature(s) Signed: 05/08/2022 9:12:17 AM By: Fredirick Maudlin MD FACS Entered By: Fredirick Maudlin on 05/08/2022 09:12:17 -------------------------------------------------------------------------------- Multi-Disciplinary Care Plan Details Patient Name: Date of Service: Joline Maxcy 05/08/2022 8:45 A M Medical Record Number: [id number] Patient Account Number: [id number] Date of Birth/Sex: Treating RN: 05/30/1950 (72 y.o. Marta Lamas Primary Care Amaury Kuzel: Cletis [place] Other Clinician: Referring Erynn Vaca: Treating Channie Bostick/Extender: Colvin Caroli in Treatment: 3 Active Inactive Wound/Skin Impairment CHARLAYNE, VULTAGGIO ([id number]) [id number] Page 4 of 7 Nursing Diagnoses: Impaired tissue integrity Goals: Patient/caregiver will verbalize understanding of skin care regimen Date Initiated: 04/11/2022 Target Resolution Date: 06/29/2022 Goal Status: Active Interventions: Assess ulceration(s) every visit Treatment Activities: Skin care regimen initiated : 04/11/2022 Notes: Electronic Signature(s) Signed: 05/08/2022 8:53:05 AM By: Blanche East RN Entered By: Blanche East on 05/08/2022 08:53:04 -------------------------------------------------------------------------------- Pain Assessment Details Patient Name: Date of Service: Joline Maxcy 05/08/2022 8:45 A M Medical Record Number: [id number] Patient Account Number: [id number] Date of Birth/Sex: Treating RN: 11/04/1950 (72 y.o. Marta Lamas Primary Care Nicoles Sedlacek: Cletis [place] Other Clinician: Referring Lasaundra Riche: Treating Chapin Arduini/Extender: Colvin Caroli in Treatment: 3 Active Problems Location of Pain Severity and Description of Pain Patient Has Paino No Site  Locations Rate the pain. Current Pain Level: 0 Pain Management and Medication Current Pain Management: Electronic Signature(s) Signed: 05/08/2022 5:23:33 PM By: Blanche East RN Entered By: Blanche East on 05/08/2022 08:44:02 Sheral Apley ([id number]) [id number] Page 5 of 7 -------------------------------------------------------------------------------- Patient/Caregiver Education Details Patient Name: Date of Service: LANETTE, ELL 1/8/2024andnbsp8:45 A M Medical Record Number: [id number] Patient Account Number: [id number] Date of Birth/Gender: Treating RN: 06-Sep-1950 (72 y.o. Marta Lamas Primary Care Physician: Cletis [place] Other Clinician: Referring Physician: Treating Physician/Extender: Colvin Caroli in Treatment: 3 Education Assessment Education Provided To: Patient Education Topics Provided Hyperbaric Oxygenation: Methods: Explain/Verbal Responses: State content correctly Wound Debridement: Methods: Explain/Verbal Responses: Reinforcements needed, State content correctly Wound/Skin Impairment: Methods: Explain/Verbal Responses: Reinforcements needed, State content correctly Electronic Signature(s) Signed: 05/08/2022 5:23:33 PM By: Blanche East RN Entered By: Blanche East on 05/08/2022 08:53:37 -------------------------------------------------------------------------------- Wound Assessment Details Patient Name: Date of Service: Joline Maxcy 05/08/2022 8:45 A M Medical Record Number: [id number] Patient Account Number: [id number] Date of Birth/Sex: Treating RN: November 01, 1950 (72 y.o. F) Zochol, Jamie Primary  Care Nakshatra Klose: Cletis [place] Other Clinician: Referring Cristabel Bicknell: Treating Jaylie Neaves/Extender: Colvin Caroli in Treatment: 3 Wound Status Wound Number: 1 Primary Etiology: Dehisced Wound Wound Location: Right Knee Wound Status: Open Wounding Event: Surgical  Injury Comorbid History: Cataracts, End Stage Renal Disease, Osteoarthritis Date Acquired: 01/24/2022 Weeks Of Treatment: 3 Clustered Wound: No Photos EVELY, GAINEY ([id number]) [id number] Page 6 of 7 Wound Measurements Length: (cm) 3 Width: (cm) 0.5 Depth: (cm) 0.2 Area: (cm) 1.178 Volume: (cm) 0.236 % Reduction in Area: 80.8% % Reduction in Volume: 90.4% Epithelialization: Small (1-33%) Tunneling: No Undermining: No Wound Description Classification: Full Thickness Without Exposed Support Structures Wound Margin: Distinct, outline attached Exudate Amount: Medium Exudate Type: Serosanguineous Exudate Color: red, brown Foul Odor After Cleansing: No Slough/Fibrino Yes Wound Bed Granulation Amount: Small (1-33%) Exposed Structure Granulation Quality: Pink Fascia Exposed: No Necrotic Amount: Large (67-100%) Fat Layer (Subcutaneous Tissue) Exposed: Yes Necrotic Quality: Eschar, Adherent Slough Tendon Exposed: No Muscle Exposed: No Joint Exposed: No Bone Exposed: No Periwound Skin Texture Texture Color No Abnormalities Noted: No No Abnormalities Noted: Yes Scarring: Yes Temperature / Pain Temperature: No Abnormality Moisture No Abnormalities Noted: Yes Treatment Notes Wound #1 (Knee) Wound Laterality: Right Cleanser Soap and Water Discharge Instruction: May shower and wash wound with dial antibacterial soap and water prior to dressing change. Wound Cleanser Discharge Instruction: Cleanse the wound with wound cleanser prior to applying a clean dressing using gauze sponges, not tissue or cotton balls. Peri-Wound Care Topical Primary Dressing Hydrofera Blue Ready Transfer Foam, 4x5 (in/in) Discharge Instruction: Apply to wound bed , moisten with Hydrogel Secondary Dressing Bordered Gauze, 4x4 in Discharge Instruction: Apply over primary dressing as directed. Secured With Compression Wrap Compression Stockings Sport and exercise psychologist) Signed: 05/08/2022 5:23:33 PM By: Blanche East RN TATUM, CORL ([id number]) PM By: Blanche East RN [phone].pdf Page 7 of 7 Signed: 05/08/2022 5:23:33 Entered By: Blanche East on 05/08/2022 08:46:55 -------------------------------------------------------------------------------- Vitals Details Patient Name: Date of Service: EUNIE, LAWN 05/08/2022 8:45 A M Medical Record Number: [id number] Patient Account Number: [id number] Date of Birth/Sex: Treating RN: 06/05/1950 (72 y.o. Iver Nestle, Jamie Primary Care Cherity Blickenstaff: Cletis [place] Other Clinician: Referring Evetta Renner: Treating Odeth Bry/Extender: Colvin Caroli in Treatment: 3 Vital Signs Time Taken: 08:38 Temperature (F): 98.4 Height (in): 65 Pulse (bpm): 63 Weight (lbs): 165 Respiratory Rate (breaths/min): 16 Body Mass Index (BMI): 27.5 Blood Pressure (mmHg): 143/62 Reference Range: 80 - 120 mg / dl Electronic Signature(s) Signed: 05/08/2022 5:23:33 PM By: Blanche East RN Entered By: Blanche East on 05/08/2022 08:39:19

## 2022-05-16 ENCOUNTER — Encounter (HOSPITAL_BASED_OUTPATIENT_CLINIC_OR_DEPARTMENT_OTHER): Payer: Medicare PPO | Admitting: General Surgery

## 2022-05-16 ENCOUNTER — Other Ambulatory Visit: Payer: Self-pay | Admitting: Nurse Practitioner

## 2022-05-16 DIAGNOSIS — T8131XA Disruption of external operation (surgical) wound, not elsewhere classified, initial encounter: Secondary | ICD-10-CM | POA: Diagnosis not present

## 2022-05-16 DIAGNOSIS — Z1231 Encounter for screening mammogram for malignant neoplasm of breast: Secondary | ICD-10-CM

## 2022-05-16 NOTE — Progress Notes (Signed)
Denise Bush, FILIPPONE ([id number]) [id number] Page 1 of 8 Visit Report for 05/16/2022 Chief Complaint Document Details Patient Name: Date of Service: Denise Bush, GASPARYAN 05/16/2022 11:00 A M Medical Record Number: [id number] Patient Account Number: [id number] Date of Birth/Sex: Treating RN: 23-Aug-1950 (72 y.o. F) Primary Care Provider: Cletis Athens Other Clinician: Referring Provider: Treating Provider/Extender: Colvin Caroli in Treatment: 5 Information Obtained from: Patient Chief Complaint 04/11/2022; patient is here for a nonhealing surgical wound on her right knee Electronic Signature(s) Signed: 05/16/2022 11:55:14 AM By: Fredirick Maudlin MD FACS Entered By: Fredirick Maudlin on 05/16/2022 11:55:13 -------------------------------------------------------------------------------- Debridement Details Patient Name: Date of Service: Denise Bush. 05/16/2022 11:00 A M Medical Record Number: [id number] Patient Account Number: [id number] Date of Birth/Sex: Treating RN: [birth year] (72 y.o. America Brown Primary Care Provider: Cletis Athens Other Clinician: Referring Provider: Treating Provider/Extender: Colvin Caroli in Treatment: 5 Debridement Performed for Assessment: Wound #1 Right Knee Performed By: Physician Fredirick Maudlin, MD Debridement Type: Debridement Level of Consciousness (Pre-procedure): Awake and Alert Pre-procedure Verification/Time Out Yes - 11:15 Taken: Start Time: 11:15 Pain Control: Lidocaine 5% topical ointment T Area Debrided (L x W): otal 1 (cm) x 0.3 (cm) = 0.3 (cm) Tissue and other material debrided: Non-Viable, Eschar, Slough, Subcutaneous, Slough Level: Skin/Subcutaneous Tissue Debridement Description: Excisional Instrument: Curette Bleeding: Minimum Hemostasis Achieved: Pressure End Time: 11:17 Procedural Pain: 0 Post Procedural Pain: 0 Response to Treatment: Procedure was  tolerated well Level of Consciousness (Post- Awake and Alert procedure): Post Debridement Measurements of Total Wound Length: (cm) 1 Width: (cm) 0.3 Depth: (cm) 0.2 Volume: (cm) 0.047 Character of Wound/Ulcer Post Debridement: Improved Post Procedure Diagnosis ZARIELLE, CEA ([id number]) [id number] Page 2 of 8 Same as Pre-procedure Notes Scribed for Dr. Celine Ahr by J.Scotton Electronic Signature(s) Signed: 05/16/2022 12:03:43 PM By: Fredirick Maudlin MD FACS Signed: 05/16/2022 4:37:52 PM By: Dellie Catholic RN Entered By: Dellie Catholic on 05/16/2022 11:28:12 -------------------------------------------------------------------------------- HPI Details Patient Name: Date of Service: Denise Bush. 05/16/2022 11:00 A M Medical Record Number: [id number] Patient Account Number: [id number] Date of Birth/Sex: Treating RN: 1950/09/26 (72 y.o. F) Primary Care Provider: Cletis Athens Other Clinician: Referring Provider: Treating Provider/Extender: Colvin Caroli in Treatment: 5 History of Present Illness HPI Description: ADMISSION 04/11/2022 This is a pleasant 72 year old woman accompanied by her husband. She has a history of type 2 diabetes reasonably well-controlled status post kidney transplant in 2019. She had a right total knee replacement towards the end of September of this year. According to the patient this never really healed they used surgical glue for adhesion that was not sutured. She simply says it never closed. This has been followed by orthopedic surgery and they have referred her here apparently she has been using Vaseline gauze. The wound is 6 x 1.3 x 0.4 and completely eschar covered Past medical history includes type 2 diabetes with a kidney transplant in 2019. She had a right total knee replacement on 01/21/2022. She has a history of SIADH, hypothyroidism she is legally blind. ABI in our clinic was 1.16 04/18/2022:  Compared to last week's measurements and description, the wound is a little bit smaller today. It is also quite a bit cleaner. They have been using Hydrofera Blue with a foam border dressing. 04/28/2022: The wound is contracting nicely. It is a little bit dry on the surface. There is slough and eschar accumulation. No concern for infection. 05/08/2022: The wound is smaller again today. There is slough and  eschar on the surface. Moisture balance is better. 05/16/2022: The wound continues to epithelialize. It is down to just 2 small open areas, both of which have some slough and eschar present. Electronic Signature(s) Signed: 05/16/2022 12:00:47 PM By: Fredirick Maudlin MD FACS Entered By: Fredirick Maudlin on 05/16/2022 12:00:47 -------------------------------------------------------------------------------- Physical Exam Details Patient Name: Date of Service: Denise Bush 05/16/2022 11:00 A M Medical Record Number: [id number] Patient Account Number: [id number] Date of Birth/Sex: Treating RN: 1950-11-03 (72 y.o. F) Primary Care Provider: Cletis Athens Other Clinician: Referring Provider: Treating Provider/Extender: Colvin Caroli in Treatment: 5 Constitutional . . . . no acute distress. Respiratory KWYNN, SCHLOTTER ([id number]) [id number] Page 3 of 8 Normal work of breathing on room air. Notes 05/16/2022: The wound continues to epithelialize. It is down to just 2 small open areas, both of which have some slough and eschar present. Electronic Signature(s) Signed: 05/16/2022 12:01:19 PM By: Fredirick Maudlin MD FACS Entered By: Fredirick Maudlin on 05/16/2022 12:01:19 -------------------------------------------------------------------------------- Physician Orders Details Patient Name: Date of Service: Denise Bush 05/16/2022 11:00 A M Medical Record Number: [id number] Patient Account Number: [id number] Date of Birth/Sex: Treating  RN: 1951/04/15 (72 y.o. America Brown Primary Care Provider: Cletis Athens Other Clinician: Referring Provider: Treating Provider/Extender: Colvin Caroli in Treatment: 5 Verbal / Phone Orders: No Diagnosis Coding ICD-10 Coding Code Description S81.001D Unspecified open wound, right knee, subsequent encounter T81.31XD Disruption of external operation (surgical) wound, not elsewhere classified, subsequent encounter E11.622 Type 2 diabetes mellitus with other skin ulcer Z94.0 Kidney transplant status Follow-up Appointments ppointment in 1 week. - Dr. Celine Ahr Room 3 Return A Anesthetic (In clinic) Topical Lidocaine 5% applied to wound bed - Used in Clinic Bathing/ Shower/ Hygiene Other Bathing/Shower/Hygiene Orders/Instructions: - May change wound every other day, if so desired, after bathing. Additional Orders / Instructions Other: - Do Not put pressure on the right knee Home Health Wound #1 Right Knee No change in wound care orders this week; continue Home Health for wound care. May utilize formulary equivalent dressing for wound treatment orders unless otherwise specified. - Moistenen with Hydrogel to Hydrafera Blue Classic dressing Other Home Health Orders/Instructions: - [place] to perform wound care 1 x week for 9 weeks Wound Treatment Wound #1 - Knee Wound Laterality: Right Cleanser: Soap and Water Every Other Day/30 Days Discharge Instructions: May shower and wash wound with dial antibacterial soap and water prior to dressing change. Cleanser: Wound Cleanser (Generic) Every Other Day/30 Days Discharge Instructions: Cleanse the wound with wound cleanser prior to applying a clean dressing using gauze sponges, not tissue or cotton balls. Prim Dressing: Hydrofera Blue Ready Transfer Foam, 4x5 (in/in) (Generic) Every Other Day/30 Days ary Discharge Instructions: Apply to wound bed , moisten with Hydrogel Secondary Dressing: Bordered Gauze,  4x4 in (Generic) Every Other Day/30 Days Discharge Instructions: Apply over primary dressing as directed. Electronic Signature(s) Signed: 05/16/2022 12:03:43 PM By: Fredirick Maudlin MD FACS Entered By: Fredirick Maudlin on 05/16/2022 12:01:54 Sheral Apley ([id number]) [id number] Page 4 of 8 -------------------------------------------------------------------------------- Problem List Details Patient Name: Date of Service: MACKINSEY, PELLAND 05/16/2022 11:00 A M Medical Record Number: [id number] Patient Account Number: [id number] Date of Birth/Sex: Treating RN: 08/23/1950 (72 y.o. F) Primary Care Provider: Cletis Athens Other Clinician: Referring Provider: Treating Provider/Extender: Colvin Caroli in Treatment: 5 Active Problems ICD-10 Encounter Code Description Active Date MDM Diagnosis S81.001D Unspecified open wound, right knee, subsequent encounter 04/11/2022 No Yes T81.31XD  Disruption of external operation (surgical) wound, not elsewhere classified, 04/11/2022 No Yes subsequent encounter E11.622 Type 2 diabetes mellitus with other skin ulcer 04/11/2022 No Yes Z94.0 Kidney transplant status 04/11/2022 No Yes Inactive Problems Resolved Problems Electronic Signature(s) Signed: 05/16/2022 11:52:09 AM By: Fredirick Maudlin MD FACS Entered By: Fredirick Maudlin on 05/16/2022 11:52:08 -------------------------------------------------------------------------------- Progress Note Details Patient Name: Date of Service: Denise Bush. 05/16/2022 11:00 A M Medical Record Number: [id number] Patient Account Number: [id number] Date of Birth/Sex: Treating RN: 1950/11/04 (72 y.o. F) Primary Care Provider: Cletis Athens Other Clinician: Referring Provider: Treating Provider/Extender: Colvin Caroli in Treatment: 5 Subjective Chief Complaint Information obtained from Patient 04/11/2022; patient is here for a  nonhealing surgical wound on her right knee History of Present Illness (HPI) ADMISSION 04/11/2022 KEYANA, GUEVARA ([id number]) [id number] Page 5 of 8 This is a pleasant 72 year old woman accompanied by her husband. She has a history of type 2 diabetes reasonably well-controlled status post kidney transplant in 2019. She had a right total knee replacement towards the end of September of this year. According to the patient this never really healed they used surgical glue for adhesion that was not sutured. She simply says it never closed. This has been followed by orthopedic surgery and they have referred her here apparently she has been using Vaseline gauze. The wound is 6 x 1.3 x 0.4 and completely eschar covered Past medical history includes type 2 diabetes with a kidney transplant in 2019. She had a right total knee replacement on 01/21/2022. She has a history of SIADH, hypothyroidism she is legally blind. ABI in our clinic was 1.16 04/18/2022: Compared to last week's measurements and description, the wound is a little bit smaller today. It is also quite a bit cleaner. They have been using Hydrofera Blue with a foam border dressing. 04/28/2022: The wound is contracting nicely. It is a little bit dry on the surface. There is slough and eschar accumulation. No concern for infection. 05/08/2022: The wound is smaller again today. There is slough and eschar on the surface. Moisture balance is better. 05/16/2022: The wound continues to epithelialize. It is down to just 2 small open areas, both of which have some slough and eschar present. Patient History Information obtained from Patient. Family History Unknown History. Social History Never smoker, Marital Status - Married, Alcohol Use - Never, Drug Use - No History, Caffeine Use - Rarely. Medical History Eyes Patient has history of Cataracts Genitourinary Patient has history of End Stage Renal Disease - Was On  Dialysis Musculoskeletal Patient has history of Osteoarthritis - Knees Hospitalization/Surgery History - Right T knee arthroplasty. otal Medical A Surgical History Notes nd Eyes Hx: Diabetic Retinopathy Hematologic/Lymphatic Hx: Blood Transfusions Cardiovascular Hx: Hashimoto's disease Endocrine Hx: Hyperparathyroidism secondary to renal;Hypothyroidism Diabetes Mellitus Type 2 (35 years) Genitourinary Hx: Of Right Kidney transplant Objective Constitutional no acute distress. Vitals Time Taken: 11:05 AM, Height: 65 in, Weight: 165 lbs, BMI: 27.5, Temperature: 98.3 F, Pulse: 64 bpm, Respiratory Rate: 16 breaths/min, Blood Pressure: 131/71 mmHg. Respiratory Normal work of breathing on room air. General Notes: 05/16/2022: The wound continues to epithelialize. It is down to just 2 small open areas, both of which have some slough and eschar present. Integumentary (Hair, Skin) Wound #1 status is Open. Original cause of wound was Surgical Injury. The date acquired was: 01/24/2022. The wound has been in treatment 5 weeks. The wound is located on the Right Knee. The wound measures 1cm length x 0.3cm width x 0.2cm  depth; 0.236cm^2 area and 0.047cm^3 volume. There is Fat Layer (Subcutaneous Tissue) exposed. There is no tunneling or undermining noted. There is a medium amount of serosanguineous drainage noted. The wound margin is distinct with the outline attached to the wound base. There is medium (34-66%) pink granulation within the wound bed. There is a medium (34-66%) amount of necrotic tissue within the wound bed including Eschar and Adherent Slough. The periwound skin appearance had no abnormalities noted for moisture. The periwound skin appearance had no abnormalities noted for color. The periwound skin appearance exhibited: Scarring. Periwound temperature was noted as No Abnormality. Assessment RETINA, BERNARDY ([id number]) [id number] Page 6 of 8 Active  Problems ICD-10 Unspecified open wound, right knee, subsequent encounter Disruption of external operation (surgical) wound, not elsewhere classified, subsequent encounter Type 2 diabetes mellitus with other skin ulcer Kidney transplant status Procedures Wound #1 Pre-procedure diagnosis of Wound #1 is a Dehisced Wound located on the Right Knee . There was a Excisional Skin/Subcutaneous Tissue Debridement with a total area of 0.3 sq cm performed by Fredirick Maudlin, MD. With the following instrument(s): Curette to remove Non-Viable tissue/material. Material removed includes Eschar, Subcutaneous Tissue, and Slough after achieving pain control using Lidocaine 5% topical ointment. A time out was conducted at 11:15, prior to the start of the procedure. A Minimum amount of bleeding was controlled with Pressure. The procedure was tolerated well with a pain level of 0 throughout and a pain level of 0 following the procedure. Post Debridement Measurements: 1cm length x 0.3cm width x 0.2cm depth; 0.047cm^3 volume. Character of Wound/Ulcer Post Debridement is improved. Post procedure Diagnosis Wound #1: Same as Pre-Procedure General Notes: Scribed for Dr. Celine Ahr by J.Scotton. Plan Follow-up Appointments: Return Appointment in 1 week. - Dr. Celine Ahr Room 3 Anesthetic: (In clinic) Topical Lidocaine 5% applied to wound bed - Used in Clinic Bathing/ Shower/ Hygiene: Other Bathing/Shower/Hygiene Orders/Instructions: - May change wound every other day, if so desired, after bathing. Additional Orders / Instructions: Other: - Do Not put pressure on the right knee Home Health: Wound #1 Right Knee: No change in wound care orders this week; continue Home Health for wound care. May utilize formulary equivalent dressing for wound treatment orders unless otherwise specified. - Moistenen with Hydrogel to Hydrafera Blue Classic dressing Other Home Health Orders/Instructions: - [place] to perform wound care  1 x week for 9 weeks WOUND #1: - Knee Wound Laterality: Right Cleanser: Soap and Water Every Other Day/30 Days Discharge Instructions: May shower and wash wound with dial antibacterial soap and water prior to dressing change. Cleanser: Wound Cleanser (Generic) Every Other Day/30 Days Discharge Instructions: Cleanse the wound with wound cleanser prior to applying a clean dressing using gauze sponges, not tissue or cotton balls. Prim Dressing: Hydrofera Blue Ready Transfer Foam, 4x5 (in/in) (Generic) Every Other Day/30 Days ary Discharge Instructions: Apply to wound bed , moisten with Hydrogel Secondary Dressing: Bordered Gauze, 4x4 in (Generic) Every Other Day/30 Days Discharge Instructions: Apply over primary dressing as directed. 05/16/2022: The wound continues to epithelialize. It is down to just 2 small open areas, both of which have some slough and eschar present. I used a curette to debride slough, nonviable subcutaneous tissue, and eschar from the wound. We will continue Hydrofera Blue and hydrogel. Follow-up in 1 week. Electronic Signature(s) Signed: 05/16/2022 12:03:00 PM By: Fredirick Maudlin MD FACS Previous Signature: 05/16/2022 12:02:28 PM Version By: Fredirick Maudlin MD FACS Entered By: Fredirick Maudlin on 05/16/2022 12:03:00 -------------------------------------------------------------------------------- HxROS Details Patient Name: Date  of Service: Denise, Bush 05/16/2022 11:00 A M Medical Record Number: [id number] Patient Account Number: [id number] Date of Birth/Sex: Treating RN: January 24, 1951 (72 y.o. F) Primary Care Provider: Cletis Athens Other Clinician: Referring Provider: Treating Provider/Extender: Jake Michaelis [place], Jomarie Longs ([id number]) [phone].pdf Page 7 of 8 Weeks in Treatment: 5 Information Obtained From Patient Eyes Medical History: Positive for: Cataracts Past Medical History Notes: Hx: Diabetic  Retinopathy Hematologic/Lymphatic Medical History: Past Medical History Notes: Hx: Blood Transfusions Cardiovascular Medical History: Past Medical History Notes: Hx: Hashimoto's disease Endocrine Medical History: Past Medical History Notes: Hx: Hyperparathyroidism secondary to renal;Hypothyroidism Diabetes Mellitus Type 2 (35 years) Genitourinary Medical History: Positive for: End Stage Renal Disease - Was On Dialysis Past Medical History Notes: Hx: Of Right Kidney transplant Musculoskeletal Medical History: Positive for: Osteoarthritis - Knees HBO Extended History Items Eyes: Cataracts Immunizations Pneumococcal Vaccine: Received Pneumococcal Vaccination: Yes Received Pneumococcal Vaccination On or After 60th Birthday: Yes Implantable Devices Yes Hospitalization / Surgery History Type of Hospitalization/Surgery Right T knee arthroplasty otal Family and Social History Unknown History: Yes; Never smoker; Marital Status - Married; Alcohol Use: Never; Drug Use: No History; Caffeine Use: Rarely; Financial Concerns: No; Food, Clothing or Shelter Needs: No; Support System Lacking: No; Transportation Concerns: No Electronic Signature(s) Signed: 05/16/2022 12:03:43 PM By: Fredirick Maudlin MD FACS Entered By: Fredirick Maudlin on 05/16/2022 12:00:53 Sheral Apley ([id number]) [id number] Page 8 of 8 -------------------------------------------------------------------------------- SuperBill Details Patient Name: Date of Service: KENIYAH, GELINAS 05/16/2022 Medical Record Number: [id number] Patient Account Number: [id number] Date of Birth/Sex: Treating RN: [birth year] (72 y.o. F) Primary Care Provider: Cletis Athens Other Clinician: Referring Provider: Treating Provider/Extender: Colvin Caroli in Treatment: 5 Diagnosis Coding ICD-10 Codes Code Description S81.001D Unspecified open wound, right knee, subsequent  encounter T81.31XD Disruption of external operation (surgical) wound, not elsewhere classified, subsequent encounter E11.622 Type 2 diabetes mellitus with other skin ulcer Z94.0 Kidney transplant status Facility Procedures : CPT4 Code: [id number] Description: 27782 - DEB SUBQ TISSUE 20 SQ CM/< ICD-10 Diagnosis Description S81.001D Unspecified open wound, right knee, subsequent encounter T81.31XD Disruption of external operation (surgical) wound, not elsewhere classified, subs Modifier: equent encounter Quantity: 1 Physician Procedures : CPT4 Code Description Modifier [id number] - WC PHYS LEVEL 3 - EST PT 25 ICD-10 Diagnosis Description S81.001D Unspecified open wound, right knee, subsequent encounter T81.31XD Disruption of external operation (surgical) wound, not elsewhere  classified, subsequent encounter E11.622 Type 2 diabetes mellitus with other skin ulcer Z94.0 Kidney transplant status Quantity: 1 : [id number] 11042 - WC PHYS SUBQ TISS 20 SQ CM ICD-10 Diagnosis Description S81.001D Unspecified open wound, right knee, subsequent encounter T81.31XD Disruption of external operation (surgical) wound, not elsewhere classified, subsequent encounter Quantity: 1 Electronic Signature(s) Signed: 05/16/2022 12:03:19 PM By: Fredirick Maudlin MD FACS Entered By: Fredirick Maudlin on 05/16/2022 12:03:19

## 2022-05-16 NOTE — Progress Notes (Signed)
Denise Bush, Denise Bush ([id number]) [id number] Page 1 of 7 Visit Report for 05/16/2022 Arrival Information Details Patient Name: Date of Service: Denise Bush, Denise Bush 05/16/2022 11:00 A M Medical Record Number: [id number] Patient Account Number: [id number] Date of Birth/Sex: Treating RN: 1951/01/27 (72 y.o. America Brown Primary Care Silvia Markuson: Cletis [place] Other Clinician: Referring Laverna Dossett: Treating Loany Neuroth/Extender: Colvin Caroli in Treatment: 5 Visit Information History Since Last Visit Added or deleted any medications: No Patient Arrived: Ambulatory Any new allergies or adverse reactions: No Arrival Time: 11:06 Had a fall or experienced change in No Accompanied By: spouse activities of daily living that may affect Transfer Assistance: None risk of falls: Patient Identification Verified: Yes Signs or symptoms of abuse/neglect since last visito No Patient Requires Transmission-Based Precautions: No Hospitalized since last visit: No Patient Has Alerts: No Implantable device outside of the clinic excluding No cellular tissue based products placed in the center since last visit: Has Dressing in Place as Prescribed: Yes Pain Present Now: No Electronic Signature(s) Signed: 05/16/2022 4:37:52 PM By: Dellie Catholic RN Entered By: Dellie Catholic on 05/16/2022 11:07:12 -------------------------------------------------------------------------------- Encounter Discharge Information Details Patient Name: Date of Service: Denise Maxcy. 05/16/2022 11:00 A M Medical Record Number: [id number] Patient Account Number: [id number] Date of Birth/Sex: Treating RN: 1950-07-23 (72 y.o. America Brown Primary Care Kaden Dunkel: Cletis [place] Other Clinician: Referring Callin Ashe: Treating Labaron Digirolamo/Extender: Colvin Caroli in Treatment: 5 Encounter Discharge Information Items Post Procedure Vitals Discharge Condition:  Stable Temperature (F): 98.3 Ambulatory Status: Ambulatory Pulse (bpm): 64 Discharge Destination: Home Respiratory Rate (breaths/min): 16 Transportation: Other Blood Pressure (mmHg): 131/71 Accompanied By: spouse Schedule Follow-up Appointment: Yes Clinical Summary of Care: Patient Declined Electronic Signature(s) Signed: 05/16/2022 4:37:52 PM By: Dellie Catholic RN Entered By: Dellie Catholic on 05/16/2022 16:26:31 Denise Bush ([id number]) [id number] Page 2 of 7 -------------------------------------------------------------------------------- Lower Extremity Assessment Details Patient Name: Date of Service: Denise Bush, Denise Bush 05/16/2022 11:00 A M Medical Record Number: [id number] Patient Account Number: [id number] Date of Birth/Sex: Treating RN: 20-Aug-1950 (72 y.o. America Brown Primary Care Lenya Sterne: Cletis [place] Other Clinician: Referring Amirah Goerke: Treating Jahni Paul/Extender: Colvin Caroli in Treatment: 5 Edema Assessment Assessed: [Left: No] [Right: No] [Left: Edema] [Right: :] Calf Left: Right: Point of Measurement: From Medial Instep 28.4 cm Ankle Left: Right: Point of Measurement: From Medial Instep 18.4 cm Vascular Assessment Pulses: Dorsalis Pedis Palpable: [Right:Yes] Electronic Signature(s) Signed: 05/16/2022 4:37:52 PM By: Dellie Catholic RN Entered By: Dellie Catholic on 05/16/2022 11:21:23 -------------------------------------------------------------------------------- Multi Wound Chart Details Patient Name: Date of Service: Denise Maxcy. 05/16/2022 11:00 A M Medical Record Number: [id number] Patient Account Number: [id number] Date of Birth/Sex: Treating RN: April 29, 1951 (72 y.o. F) Primary Care Gary Gabrielsen: Cletis [place] Other Clinician: Referring Sorin Frimpong: Treating Ziva Nunziata/Extender: Colvin Caroli in Treatment: 5 Vital Signs Height(in): 65 Pulse(bpm): 64 Weight(lbs):  165 Blood Pressure(mmHg): 131/71 Body Mass Index(BMI): 27.5 Temperature(F): 98.3 Respiratory Rate(breaths/min): 16 [1:Photos:] [Bush/A:Bush/A] Right Knee Bush/A Bush/A Wound Location: Surgical Injury Bush/A Bush/A Wounding Event: Dehisced Wound Bush/A Bush/A Primary Etiology: Cataracts, End Stage Renal Disease, Bush/A Bush/A Comorbid History: Osteoarthritis 01/24/2022 Bush/A Bush/A Date Acquired: 5 Bush/A Bush/A Weeks of Treatment: Open Bush/A Bush/A Wound Status: No Bush/A Bush/A Wound Recurrence: 1x0.3x0.2 Bush/A Bush/A Measurements L x W x D (cm) 0.236 Bush/A Bush/A A (cm) : rea 0.047 Bush/A Bush/A Volume (cm) : 96.10% Bush/A Bush/A % Reduction in A rea: 98.10% Bush/A Bush/A % Reduction in Volume: Full Thickness Without Exposed Bush/A  Bush/A Classification: Support Structures Medium Bush/A Bush/A Exudate A mount: Serosanguineous Bush/A Bush/A Exudate Type: red, brown Bush/A Bush/A Exudate Color: Distinct, outline attached Bush/A Bush/A Wound Margin: Medium (34-66%) Bush/A Bush/A Granulation A mount: Pink Bush/A Bush/A Granulation Quality: Medium (34-66%) Bush/A Bush/A Necrotic A mount: Eschar, Adherent Slough Bush/A Bush/A Necrotic Tissue: Fat Layer (Subcutaneous Tissue): Yes Bush/A Bush/A Exposed Structures: Fascia: No Tendon: No Muscle: No Joint: No Bone: No Small (1-33%) Bush/A Bush/A Epithelialization: Debridement - Excisional Bush/A Bush/A Debridement: Pre-procedure Verification/Time Out 11:15 Bush/A Bush/A Taken: Lidocaine 5% topical ointment Bush/A Bush/A Pain Control: Necrotic/Eschar, Subcutaneous, Bush/A Bush/A Tissue Debrided: Slough Skin/Subcutaneous Tissue Bush/A Bush/A Level: 0.3 Bush/A Bush/A Debridement A (sq cm): rea Curette Bush/A Bush/A Instrument: Minimum Bush/A Bush/A Bleeding: Pressure Bush/A Bush/A Hemostasis Achieved: 0 Bush/A Bush/A Procedural Pain: 0 Bush/A Bush/A Post Procedural Pain: Debridement Treatment Response: Procedure was tolerated well Bush/A Bush/A Post Debridement Measurements L x 1x0.3x0.2 Bush/A Bush/A W x D (cm) 0.047 Bush/A Bush/A Post Debridement Volume: (cm) Scarring: Yes Bush/A Bush/A Periwound Skin  Texture: No Abnormalities Noted Bush/A Bush/A Periwound Skin Moisture: No Abnormalities Noted Bush/A Bush/A Periwound Skin Color: No Abnormality Bush/A Bush/A Temperature: Debridement Bush/A Bush/A Procedures Performed: Treatment Notes Electronic Signature(s) Signed: 05/16/2022 11:55:04 AM By: Fredirick Maudlin MD FACS Entered By: Fredirick Maudlin on 05/16/2022 11:55:04 -------------------------------------------------------------------------------- Multi-Disciplinary Care Plan Details Patient Name: Date of Service: Denise Maxcy. 05/16/2022 11:00 A M Medical Record Number: [id number] Patient Account Number: [id number] Date of Birth/Sex: Treating RN: 05-05-1950 (72 y.o. America Brown Primary Care Araiya Tilmon: Cletis [place] Other Clinician: Referring Kourtney Terriquez: Treating Miho Monda/Extender: Colvin Caroli in Treatment: [address] ROMI, RATHEL ([id number]) [id number] Page 4 of 7 Wound/Skin Impairment Nursing Diagnoses: Impaired tissue integrity Goals: Patient/caregiver will verbalize understanding of skin care regimen Date Initiated: 04/11/2022 Target Resolution Date: 06/29/2022 Goal Status: Active Interventions: Assess ulceration(s) every visit Treatment Activities: Skin care regimen initiated : 04/11/2022 Notes: Electronic Signature(s) Signed: 05/16/2022 4:37:52 PM By: Dellie Catholic RN Entered By: Dellie Catholic on 05/16/2022 16:22:24 -------------------------------------------------------------------------------- Pain Assessment Details Patient Name: Date of Service: Denise Maxcy 05/16/2022 11:00 A M Medical Record Number: [id number] Patient Account Number: [id number] Date of Birth/Sex: Treating RN: 10-05-1950 (72 y.o. America Brown Primary Care Morganne Haile: Cletis [place] Other Clinician: Referring Shanan Fitzpatrick: Treating Schyler Counsell/Extender: Colvin Caroli in Treatment: 5 Active Problems Location of Pain  Severity and Description of Pain Patient Has Paino No Site Locations Pain Management and Medication Current Pain Management: Electronic Signature(s) Signed: 05/16/2022 4:37:52 PM By: Dellie Catholic RN Entered By: Dellie Catholic on 05/16/2022 11:21:08 Denise Bush ([id number]) [id number] Page 5 of 7 -------------------------------------------------------------------------------- Patient/Caregiver Education Details Patient Name: Date of Service: Denise Bush, Denise H. 1/16/2024andnbsp11:00 A M Medical Record Number: [id number] Patient Account Number: [id number] Date of Birth/Gender: Treating RN: 17-Jan-1951 (72 y.o. America Brown Primary Care Physician: Cletis [place] Other Clinician: Referring Physician: Treating Physician/Extender: Colvin Caroli in Treatment: 5 Education Assessment Education Provided To: Patient Education Topics Provided Wound/Skin Impairment: Methods: Explain/Verbal Responses: Return demonstration correctly Electronic Signature(s) Signed: 05/16/2022 4:37:52 PM By: Dellie Catholic RN Entered By: Dellie Catholic on 05/16/2022 16:23:25 -------------------------------------------------------------------------------- Wound Assessment Details Patient Name: Date of Service: Denise Maxcy 05/16/2022 11:00 A M Medical Record Number: [id number] Patient Account Number: [id number] Date of Birth/Sex: Treating RN: 1950/10/18 (72 y.o. America Brown Primary Care Chenell Lozon: Cletis [place] Other Clinician: Referring Amery Vandenbos: Treating Nyxon Strupp/Extender: Jake Michaelis Weeks in Treatment: 5 Wound Status Wound  Number: 1 Primary Etiology: Dehisced Wound Wound Location: Right Knee Wound Status: Open Wounding Event: Surgical Injury Comorbid History: Cataracts, End Stage Renal Disease, Osteoarthritis Date Acquired: 01/24/2022 Weeks Of Treatment: 5 Clustered Wound: No Photos Wound  Measurements Length: (cm) 1 Width: (cm) 0.3 Denise Bush, Denise Bush ([id number]) Depth: (cm) 0 Area: (cm) Volume: (cm) % Reduction in Area: 96.1% % Reduction in Volume: 98.1% [id number] Page 6 of 7 .2 Epithelialization: Small (1-33%) 0.236 Tunneling: No 0.047 Undermining: No Wound Description Classification: Full Thickness Without Exposed Support Structures Wound Margin: Distinct, outline attached Exudate Amount: Medium Exudate Type: Serosanguineous Exudate Color: red, brown Foul Odor After Cleansing: No Slough/Fibrino Yes Wound Bed Granulation Amount: Medium (34-66%) Exposed Structure Granulation Quality: Pink Fascia Exposed: No Necrotic Amount: Medium (34-66%) Fat Layer (Subcutaneous Tissue) Exposed: Yes Necrotic Quality: Eschar, Adherent Slough Tendon Exposed: No Muscle Exposed: No Joint Exposed: No Bone Exposed: No Periwound Skin Texture Texture Color No Abnormalities Noted: No No Abnormalities Noted: Yes Scarring: Yes Temperature / Pain Temperature: No Abnormality Moisture No Abnormalities Noted: Yes Treatment Notes Wound #1 (Knee) Wound Laterality: Right Cleanser Soap and Water Discharge Instruction: May shower and wash wound with dial antibacterial soap and water prior to dressing change. Wound Cleanser Discharge Instruction: Cleanse the wound with wound cleanser prior to applying a clean dressing using gauze sponges, not tissue or cotton balls. Peri-Wound Care Topical Primary Dressing Hydrofera Blue Ready Transfer Foam, 4x5 (in/in) Discharge Instruction: Apply to wound bed , moisten with Hydrogel Secondary Dressing Bordered Gauze, 4x4 in Discharge Instruction: Apply over primary dressing as directed. Secured With Compression Wrap Compression Stockings Environmental education officer) Signed: 05/16/2022 4:37:52 PM By: Dellie Catholic RN Entered By: Dellie Catholic on 05/16/2022  11:28:28 -------------------------------------------------------------------------------- Vitals Details Patient Name: Date of Service: Denise Maxcy. 05/16/2022 11:00 A M Medical Record Number: [id number] Patient Account Number: [id number] Date of Birth/Sex: Treating RN: 1951/04/08 (72 y.o. America Brown Primary Care Bryler Dibble: Cletis [place] Other Clinician: MARGREAT, Denise Bush ([id number]) [id number] Page 7 of 7 Referring Alika Saladin: Treating Conard Alvira/Extender: Colvin Caroli in Treatment: 5 Vital Signs Time Taken: 11:05 Temperature (F): 98.3 Height (in): 65 Pulse (bpm): 64 Weight (lbs): 165 Respiratory Rate (breaths/min): 16 Body Mass Index (BMI): 27.5 Blood Pressure (mmHg): 131/71 Reference Range: 80 - 120 mg / dl Electronic Signature(s) Signed: 05/16/2022 4:37:52 PM By: Dellie Catholic RN Entered By: Dellie Catholic on 05/16/2022 11:21:00

## 2022-05-23 ENCOUNTER — Encounter (HOSPITAL_BASED_OUTPATIENT_CLINIC_OR_DEPARTMENT_OTHER): Payer: Medicare PPO | Admitting: General Surgery

## 2022-05-23 DIAGNOSIS — T8131XA Disruption of external operation (surgical) wound, not elsewhere classified, initial encounter: Secondary | ICD-10-CM | POA: Diagnosis not present

## 2022-05-23 NOTE — Progress Notes (Signed)
MALEIYAH, RELEFORD ([id number]) [id number] Page 1 of 8 Visit Report for 05/23/2022 Chief Complaint Document Details Patient Name: Date of Service: Denise Bush, Denise Bush 05/23/2022 2:15 PM Medical Record Number: [id number] Patient Account Number: [id number] Date of Birth/Sex: Treating RN: 1950/08/28 (72 y.o. F) Primary Care Provider: Cletis Athens Other Clinician: Referring Provider: Treating Provider/Extender: Colvin Caroli in Treatment: 6 Information Obtained from: Patient Chief Complaint 04/11/2022; patient is here for a nonhealing surgical wound on her right knee Electronic Signature(s) Signed: 05/23/2022 3:27:50 PM By: Fredirick Maudlin MD FACS Entered By: Fredirick Maudlin on 05/23/2022 15:27:50 -------------------------------------------------------------------------------- Debridement Details Patient Name: Date of Service: Denise Bush 05/23/2022 2:15 PM Medical Record Number: [id number] Patient Account Number: [id number] Date of Birth/Sex: Treating RN: [birth year] (72 y.o. America Brown Primary Care Provider: Cletis Athens Other Clinician: Referring Provider: Treating Provider/Extender: Colvin Caroli in Treatment: 6 Debridement Performed for Assessment: Wound #1 Right Knee Performed By: Physician Fredirick Maudlin, MD Debridement Type: Debridement Level of Consciousness (Pre-procedure): Awake and Alert Pre-procedure Verification/Time Out Yes - 14:25 Taken: Start Time: 14:25 Pain Control: Lidocaine 5% topical ointment T Area Debrided (L x W): otal 0.6 (cm) x 0.3 (cm) = 0.18 (cm) Tissue and other material debrided: Non-Viable, Eschar, Slough, Slough Level: Non-Viable Tissue Debridement Description: Selective/Open Wound Instrument: Curette Bleeding: Minimum Hemostasis Achieved: Pressure End Time: 14:26 Procedural Pain: 0 Post Procedural Pain: 0 Response to Treatment: Procedure was tolerated  well Level of Consciousness (Post- Awake and Alert procedure): Post Debridement Measurements of Total Wound Length: (cm) 0.6 Width: (cm) 0.3 Depth: (cm) 0.1 Volume: (cm) 0.014 Character of Wound/Ulcer Post Debridement: Improved Post Procedure Diagnosis Denise Bush, Denise Bush ([id number]) [id number] Page 2 of 8 Same as Pre-procedure Notes Scribed for Dr. Celine Ahr by J.Scotton Electronic Signature(s) Signed: 05/23/2022 3:28:26 PM By: Dellie Catholic RN Signed: 05/23/2022 4:15:16 PM By: Fredirick Maudlin MD FACS Entered By: Dellie Catholic on 05/23/2022 14:32:40 -------------------------------------------------------------------------------- HPI Details Patient Name: Date of Service: Denise Bush 05/23/2022 2:15 PM Medical Record Number: [id number] Patient Account Number: [id number] Date of Birth/Sex: Treating RN: 1950/06/08 (72 y.o. F) Primary Care Provider: Cletis Athens Other Clinician: Referring Provider: Treating Provider/Extender: Colvin Caroli in Treatment: 6 History of Present Illness HPI Description: ADMISSION 04/11/2022 This is a pleasant 72 year old woman accompanied by her husband. She has a history of type 2 diabetes reasonably well-controlled status post kidney transplant in 2019. She had a right total knee replacement towards the end of September of this year. According to the patient this never really healed they used surgical glue for adhesion that was not sutured. She simply says it never closed. This has been followed by orthopedic surgery and they have referred her here apparently she has been using Vaseline gauze. The wound is 6 x 1.3 x 0.4 and completely eschar covered Past medical history includes type 2 diabetes with a kidney transplant in 2019. She had a right total knee replacement on 01/21/2022. She has a history of SIADH, hypothyroidism she is legally blind. ABI in our clinic was 1.16 04/18/2022: Compared to  last week's measurements and description, the wound is a little bit smaller today. It is also quite a bit cleaner. They have been using Hydrofera Blue with a foam border dressing. 04/28/2022: The wound is contracting nicely. It is a little bit dry on the surface. There is slough and eschar accumulation. No concern for infection. 05/08/2022: The wound is smaller again today. There is slough and eschar on the  surface. Moisture balance is better. 05/16/2022: The wound continues to epithelialize. It is down to just 2 small open areas, both of which have some slough and eschar present. 05/23/2022: The wound is nearly closed. The more proximal area that was open last week is covered with a layer of eschar. Once this was removed, that site was found to have epithelialized. The more distal open area is smaller with some slough and eschar buildup. No concern for infection. Electronic Signature(s) Signed: 05/23/2022 3:28:38 PM By: Fredirick Maudlin MD FACS Entered By: Fredirick Maudlin on 05/23/2022 15:28:38 -------------------------------------------------------------------------------- Physical Exam Details Patient Name: Date of Service: Denise Bush, Denise H. 05/23/2022 2:15 PM Medical Record Number: [id number] Patient Account Number: [id number] Date of Birth/Sex: Treating RN: Nov 10, 1950 (72 y.o. F) Primary Care Provider: Cletis Athens Other Clinician: Referring Provider: Treating Provider/Extender: Colvin Caroli in Treatment: [address] Denise Bush, Denise Bush ([id number]) [id number] Page 3 of 8 . . . . no acute distress. Respiratory Normal work of breathing on room air. Notes 05/23/2022: The wound is nearly closed. The more proximal area that was open last week is covered with a layer of eschar. Once this was removed, that site was found to have epithelialized. The more distal open area is smaller with some slough and eschar buildup. No concern for  infection. Electronic Signature(s) Signed: 05/23/2022 3:29:07 PM By: Fredirick Maudlin MD FACS Entered By: Fredirick Maudlin on 05/23/2022 15:29:07 -------------------------------------------------------------------------------- Physician Orders Details Patient Name: Date of Service: Denise Bush 05/23/2022 2:15 PM Medical Record Number: [id number] Patient Account Number: [id number] Date of Birth/Sex: Treating RN: [birth year] (72 y.o. America Brown Primary Care Provider: Cletis Athens Other Clinician: Referring Provider: Treating Provider/Extender: Colvin Caroli in Treatment: 6 Verbal / Phone Orders: No Diagnosis Coding ICD-10 Coding Code Description S81.001D Unspecified open wound, right knee, subsequent encounter T81.31XD Disruption of external operation (surgical) wound, not elsewhere classified, subsequent encounter E11.622 Type 2 diabetes mellitus with other skin ulcer Z94.0 Kidney transplant status Follow-up Appointments ppointment in 1 week. - Dr. Celine Ahr Room 3 Return A Anesthetic (In clinic) Topical Lidocaine 5% applied to wound bed - Used in Clinic Bathing/ Shower/ Hygiene Other Bathing/Shower/Hygiene Orders/Instructions: - May change wound every other day, if so desired, after bathing. Additional Orders / Instructions Other: - Do Not put pressure on the right knee Home Health Wound #1 Right Knee No change in wound care orders this week; continue Home Health for wound care. May utilize formulary equivalent dressing for wound treatment orders unless otherwise specified. - Moistenen with Hydrogel to Hydrafera Blue Classic dressing Other Home Health Orders/Instructions: - [place] to perform wound care 1 x week for 9 weeks Wound Treatment Wound #1 - Knee Wound Laterality: Right Cleanser: Soap and Water Every Other Day/30 Days Discharge Instructions: May shower and wash wound with dial antibacterial soap and water prior to dressing  change. Cleanser: Wound Cleanser (Generic) Every Other Day/30 Days Discharge Instructions: Cleanse the wound with wound cleanser prior to applying a clean dressing using gauze sponges, not tissue or cotton balls. Prim Dressing: Hydrofera Blue Ready Transfer Foam, 4x5 (in/in) (Generic) Every Other Day/30 Days ary Discharge Instructions: Apply to wound bed , moisten with Hydrogel Secondary Dressing: Bordered Gauze, 4x4 in (Generic) Every Other Day/30 Days Discharge Instructions: Apply over primary dressing as directed. Denise Bush, Denise Bush ([id number]) [id number] Page 4 of 8 Electronic Signature(s) Signed: 05/23/2022 4:15:16 PM By: Fredirick Maudlin MD FACS Previous Signature: 05/23/2022 3:28:26 PM Version By: Dellie Catholic RN Entered  By: Fredirick Maudlin on 05/23/2022 15:34:18 -------------------------------------------------------------------------------- Problem List Details Patient Name: Date of Service: Denise Bush, Denise Bush 05/23/2022 2:15 PM Medical Record Number: [id number] Patient Account Number: [id number] Date of Birth/Sex: Treating RN: 1951/03/07 (72 y.o. F) Primary Care Provider: Cletis Athens Other Clinician: Referring Provider: Treating Provider/Extender: Colvin Caroli in Treatment: 6 Active Problems ICD-10 Encounter Code Description Active Date MDM Diagnosis S81.001D Unspecified open wound, right knee, subsequent encounter 04/11/2022 No Yes T81.31XD Disruption of external operation (surgical) wound, not elsewhere classified, 04/11/2022 No Yes subsequent encounter E11.622 Type 2 diabetes mellitus with other skin ulcer 04/11/2022 No Yes Z94.0 Kidney transplant status 04/11/2022 No Yes Inactive Problems Resolved Problems Electronic Signature(s) Signed: 05/23/2022 3:27:37 PM By: Fredirick Maudlin MD FACS Entered By: Fredirick Maudlin on 05/23/2022  15:27:37 -------------------------------------------------------------------------------- Progress Note Details Patient Name: Date of Service: Denise Bush 05/23/2022 2:15 PM Medical Record Number: [id number] Patient Account Number: [id number] Date of Birth/Sex: Treating RN: 01/03/1951 (72 y.o. F) Primary Care Provider: Cletis Athens Other Clinician: Referring Provider: Treating Provider/Extender: Colvin Caroli in Treatment: 6 Subjective Chief Complaint Information obtained from Patient Denise Bush, Denise Bush ([id number]) [id number] Page 5 of 8 04/11/2022; patient is here for a nonhealing surgical wound on her right knee History of Present Illness (HPI) ADMISSION 04/11/2022 This is a pleasant 72 year old woman accompanied by her husband. She has a history of type 2 diabetes reasonably well-controlled status post kidney transplant in 2019. She had a right total knee replacement towards the end of September of this year. According to the patient this never really healed they used surgical glue for adhesion that was not sutured. She simply says it never closed. This has been followed by orthopedic surgery and they have referred her here apparently she has been using Vaseline gauze. The wound is 6 x 1.3 x 0.4 and completely eschar covered Past medical history includes type 2 diabetes with a kidney transplant in 2019. She had a right total knee replacement on 01/21/2022. She has a history of SIADH, hypothyroidism she is legally blind. ABI in our clinic was 1.16 04/18/2022: Compared to last week's measurements and description, the wound is a little bit smaller today. It is also quite a bit cleaner. They have been using Hydrofera Blue with a foam border dressing. 04/28/2022: The wound is contracting nicely. It is a little bit dry on the surface. There is slough and eschar accumulation. No concern for infection. 05/08/2022: The wound is smaller  again today. There is slough and eschar on the surface. Moisture balance is better. 05/16/2022: The wound continues to epithelialize. It is down to just 2 small open areas, both of which have some slough and eschar present. 05/23/2022: The wound is nearly closed. The more proximal area that was open last week is covered with a layer of eschar. Once this was removed, that site was found to have epithelialized. The more distal open area is smaller with some slough and eschar buildup. No concern for infection. Patient History Information obtained from Patient. Family History Unknown History. Social History Never smoker, Marital Status - Married, Alcohol Use - Never, Drug Use - No History, Caffeine Use - Rarely. Medical History Eyes Patient has history of Cataracts Genitourinary Patient has history of End Stage Renal Disease - Was On Dialysis Musculoskeletal Patient has history of Osteoarthritis - Knees Hospitalization/Surgery History - Right T knee arthroplasty. otal Medical A Surgical History Notes nd Eyes Hx: Diabetic Retinopathy Hematologic/Lymphatic Hx: Blood Transfusions Cardiovascular Hx: Hashimoto's disease Endocrine  Hx: Hyperparathyroidism secondary to renal;Hypothyroidism Diabetes Mellitus Type 2 (35 years) Genitourinary Hx: Of Right Kidney transplant Objective Constitutional no acute distress. Vitals Time Taken: 2:18 PM, Height: 65 in, Weight: 165 lbs, BMI: 27.5, Temperature: 98 F, Pulse: 69 bpm, Respiratory Rate: 16 breaths/min, Blood Pressure: 135/58 mmHg. Respiratory Normal work of breathing on room air. General Notes: 05/23/2022: The wound is nearly closed. The more proximal area that was open last week is covered with a layer of eschar. Once this was removed, that site was found to have epithelialized. The more distal open area is smaller with some slough and eschar buildup. No concern for infection. Integumentary (Hair, Skin) Wound #1 status is Open. Original  cause of wound was Surgical Injury. The date acquired was: 01/24/2022. The wound has been in treatment 6 weeks. The wound is located on the Right Knee. The wound measures 0.6cm length x 0.3cm width x 0.1cm depth; 0.141cm^2 area and 0.014cm^3 volume. There is Fat Layer (Subcutaneous Tissue) exposed. There is no tunneling or undermining noted. There is a medium amount of serosanguineous drainage noted. The wound Denise Bush, Denise Bush ([id number]) [id number] Page 6 of 8 margin is distinct with the outline attached to the wound base. There is medium (34-66%) pink granulation within the wound bed. There is a medium (34-66%) amount of necrotic tissue within the wound bed including Eschar and Adherent Slough. The periwound skin appearance had no abnormalities noted for moisture. The periwound skin appearance had no abnormalities noted for color. The periwound skin appearance exhibited: Scarring. Periwound temperature was noted as No Abnormality. Assessment Active Problems ICD-10 Unspecified open wound, right knee, subsequent encounter Disruption of external operation (surgical) wound, not elsewhere classified, subsequent encounter Type 2 diabetes mellitus with other skin ulcer Kidney transplant status Procedures Wound #1 Pre-procedure diagnosis of Wound #1 is a Dehisced Wound located on the Right Knee . There was a Selective/Open Wound Non-Viable Tissue Debridement with a total area of 0.18 sq cm performed by Fredirick Maudlin, MD. With the following instrument(s): Curette to remove Non-Viable tissue/material. Material removed includes Eschar and Slough and after achieving pain control using Lidocaine 5% topical ointment. No specimens were taken. A time out was conducted at 14:25, prior to the start of the procedure. A Minimum amount of bleeding was controlled with Pressure. The procedure was tolerated well with a pain level of 0 throughout and a pain level of 0 following the  procedure. Post Debridement Measurements: 0.6cm length x 0.3cm width x 0.1cm depth; 0.014cm^3 volume. Character of Wound/Ulcer Post Debridement is improved. Post procedure Diagnosis Wound #1: Same as Pre-Procedure General Notes: Scribed for Dr. Celine Ahr by J.Scotton. Plan Follow-up Appointments: Return Appointment in 1 week. - Dr. Celine Ahr Room 3 Anesthetic: (In clinic) Topical Lidocaine 5% applied to wound bed - Used in Clinic Bathing/ Shower/ Hygiene: Other Bathing/Shower/Hygiene Orders/Instructions: - May change wound every other day, if so desired, after bathing. Additional Orders / Instructions: Other: - Do Not put pressure on the right knee Home Health: Wound #1 Right Knee: No change in wound care orders this week; continue Home Health for wound care. May utilize formulary equivalent dressing for wound treatment orders unless otherwise specified. - Moistenen with Hydrogel to Hydrafera Blue Classic dressing Other Home Health Orders/Instructions: - [place] to perform wound care 1 x week for 9 weeks WOUND #1: - Knee Wound Laterality: Right Cleanser: Soap and Water Every Other Day/30 Days Discharge Instructions: May shower and wash wound with dial antibacterial soap and water prior to dressing change.  Cleanser: Wound Cleanser (Generic) Every Other Day/30 Days Discharge Instructions: Cleanse the wound with wound cleanser prior to applying a clean dressing using gauze sponges, not tissue or cotton balls. Prim Dressing: Hydrofera Blue Ready Transfer Foam, 4x5 (in/in) (Generic) Every Other Day/30 Days ary Discharge Instructions: Apply to wound bed , moisten with Hydrogel Secondary Dressing: Bordered Gauze, 4x4 in (Generic) Every Other Day/30 Days Discharge Instructions: Apply over primary dressing as directed. 05/23/2022: The wound is nearly closed. The more proximal area that was open last week is covered with a layer of eschar. Once this was removed, that site was found to have  epithelialized. The more distal open area is smaller with some slough and eschar buildup. No concern for infection. I used a curette to debride eschar from the proximal area and eschar and slough from the distal area. We will continue Hydrofera Blue with hydrogel. Follow-up in 1 week, at which time I anticipate she will be healed or very nearly so. Electronic Signature(s) Signed: 05/23/2022 3:35:12 PM By: Fredirick Maudlin MD FACS Entered By: Fredirick Maudlin on 05/23/2022 15:35:12 Denise Bush, Denise Bush ([id number]) [id number] Page 7 of 8 -------------------------------------------------------------------------------- HxROS Details Patient Name: Date of Service: Denise Bush, Denise Bush 05/23/2022 2:15 PM Medical Record Number: [id number] Patient Account Number: [id number] Date of Birth/Sex: Treating RN: February 16, 1951 (73 y.o. F) Primary Care Provider: Cletis Athens Other Clinician: Referring Provider: Treating Provider/Extender: Colvin Caroli in Treatment: 6 Information Obtained From Patient Eyes Medical History: Positive for: Cataracts Past Medical History Notes: Hx: Diabetic Retinopathy Hematologic/Lymphatic Medical History: Past Medical History Notes: Hx: Blood Transfusions Cardiovascular Medical History: Past Medical History Notes: Hx: Hashimoto's disease Endocrine Medical History: Past Medical History Notes: Hx: Hyperparathyroidism secondary to renal;Hypothyroidism Diabetes Mellitus Type 2 (35 years) Genitourinary Medical History: Positive for: End Stage Renal Disease - Was On Dialysis Past Medical History Notes: Hx: Of Right Kidney transplant Musculoskeletal Medical History: Positive for: Osteoarthritis - Knees HBO Extended History Items Eyes: Cataracts Immunizations Pneumococcal Vaccine: Received Pneumococcal Vaccination: Yes Received Pneumococcal Vaccination On or After 60th Birthday: Yes Implantable  Devices Yes Hospitalization / Surgery History Type of Hospitalization/Surgery Right T knee arthroplasty otal Family and Social History Unknown History: Yes; Never smoker; Marital Status - Married; Alcohol Use: Never; Drug Use: No History; Caffeine Use: Rarely; Financial Concerns: No; Food, Clothing or Shelter Needs: No; Support System Lacking: No; Transportation Concerns: No Denise Bush, Denise Bush ([id number]) [id number] Page 8 of 8 Electronic Signature(s) Signed: 05/23/2022 4:15:16 PM By: Fredirick Maudlin MD FACS Entered By: Fredirick Maudlin on 05/23/2022 15:28:45 -------------------------------------------------------------------------------- SuperBill Details Patient Name: Date of Service: Denise Bush 05/23/2022 Medical Record Number: [id number] Patient Account Number: [id number] Date of Birth/Sex: Treating RN: Jun 20, 1950 (72 y.o. America Brown Primary Care Provider: Cletis Athens Other Clinician: Referring Provider: Treating Provider/Extender: Colvin Caroli in Treatment: 6 Diagnosis Coding ICD-10 Codes Code Description S81.001D Unspecified open wound, right knee, subsequent encounter T81.31XD Disruption of external operation (surgical) wound, not elsewhere classified, subsequent encounter E11.622 Type 2 diabetes mellitus with other skin ulcer Z94.0 Kidney transplant status Facility Procedures : CPT4 Code: [id number] Description: 32440 - DEBRIDE WOUND 1ST 20 SQ CM OR < ICD-10 Diagnosis Description S81.001D Unspecified open wound, right knee, subsequent encounter T81.31XD Disruption of external operation (surgical) wound, not elsewhere classified, subs Modifier: equent encounter Quantity: 1 Physician Procedures : CPT4 Code Description Modifier [id number] - WC PHYS LEVEL 3 - EST PT 25 ICD-10 Diagnosis Description S81.001D Unspecified open wound, right knee, subsequent encounter T81.31XD Disruption  of external operation  (surgical) wound, not elsewhere  classified, subsequent encounter E11.622 Type 2 diabetes mellitus with other skin ulcer Z94.0 Kidney transplant status Quantity: 1 : [id number] - WC PHYS DEBR WO ANESTH 20 SQ CM ICD-10 Diagnosis Description S81.001D Unspecified open wound, right knee, subsequent encounter T81.31XD Disruption of external operation (surgical) wound, not elsewhere classified, subsequent encounter Quantity: 1 Electronic Signature(s) Signed: 05/23/2022 3:35:38 PM By: Fredirick Maudlin MD FACS Previous Signature: 05/23/2022 3:28:26 PM Version By: Dellie Catholic RN Entered By: Fredirick Maudlin on 05/23/2022 15:35:38

## 2022-05-23 NOTE — Progress Notes (Signed)
KALLIOPI, COUPLAND ([id number]) [id number] Page 1 of 7 Visit Report for 05/23/2022 Arrival Information Details Patient Name: Date of Service: NAILA, ELIZONDO 05/23/2022 2:15 PM Medical Record Number: [id number] Patient Account Number: [id number] Date of Birth/Sex: Treating RN: Aug 11, 1950 (72 y.o. America Brown Primary Care Jemarcus Dougal: Cletis [place] Other Clinician: Referring George Alcantar: Treating Omelia Marquart/Extender: Colvin Caroli in Treatment: 6 Visit Information History Since Last Visit Added or deleted any medications: No Patient Arrived: Ambulatory Any new allergies or adverse reactions: No Arrival Time: 14:11 Had a fall or experienced change in No Accompanied By: husband activities of daily living that may affect Transfer Assistance: None risk of falls: Patient Requires Transmission-Based Precautions: No Signs or symptoms of abuse/neglect since last visito No Patient Has Alerts: No Hospitalized since last visit: No Implantable device outside of the clinic excluding No cellular tissue based products placed in the center since last visit: Has Dressing in Place as Prescribed: Yes Pain Present Now: No Notes Pt. is legally blind Electronic Signature(s) Signed: 05/23/2022 3:28:26 PM By: Dellie Catholic RN Entered By: Dellie Catholic on 05/23/2022 14:14:07 -------------------------------------------------------------------------------- Encounter Discharge Information Details Patient Name: Date of Service: Joline Maxcy 05/23/2022 2:15 PM Medical Record Number: [id number] Patient Account Number: [id number] Date of Birth/Sex: Treating RN: 01/22/1951 (72 y.o. America Brown Primary Care Kimbley Sprague: Cletis [place] Other Clinician: Referring Neria Procter: Treating Jules Vidovich/Extender: Colvin Caroli in Treatment: 6 Encounter Discharge Information Items Discharge Condition: Stable Ambulatory Status:  Ambulatory Discharge Destination: Home Transportation: Private Auto Accompanied By: husband Schedule Follow-up Appointment: Yes Clinical Summary of Care: Patient Declined Electronic Signature(s) Signed: 05/23/2022 3:28:26 PM By: Dellie Catholic RN Entered By: Dellie Catholic on 05/23/2022 14:29:26 Sheral Apley ([id number]) [id number] Page 2 of 7 -------------------------------------------------------------------------------- Lower Extremity Assessment Details Patient Name: Date of Service: ADY, HEIMANN 05/23/2022 2:15 PM Medical Record Number: [id number] Patient Account Number: [id number] Date of Birth/Sex: Treating RN: 1951/04/21 (72 y.o. America Brown Primary Care Arsenia Goracke: Cletis [place] Other Clinician: Referring Sritha Chauncey: Treating Aubrii Sharpless/Extender: Colvin Caroli in Treatment: 6 Edema Assessment Assessed: [Left: No] [Right: No] [Left: Edema] [Right: :] Calf Left: Right: Point of Measurement: From Medial Instep 28.2 cm Ankle Left: Right: Point of Measurement: From Medial Instep 18.5 cm Vascular Assessment Pulses: Dorsalis Pedis Palpable: [Right:Yes] Electronic Signature(s) Signed: 05/23/2022 3:28:26 PM By: Dellie Catholic RN Entered By: Dellie Catholic on 05/23/2022 14:19:44 -------------------------------------------------------------------------------- Multi Wound Chart Details Patient Name: Date of Service: Joline Maxcy 05/23/2022 2:15 PM Medical Record Number: [id number] Patient Account Number: [id number] Date of Birth/Sex: Treating RN: [birth year] (72 y.o. F) Primary Care Lillyn Wieczorek: Cletis [place] Other Clinician: Referring Elianie Hubers: Treating Jaramiah Bossard/Extender: Colvin Caroli in Treatment: 6 Vital Signs Height(in): 65 Pulse(bpm): 81 Weight(lbs): 165 Blood Pressure(mmHg): 135/58 Body Mass Index(BMI): 27.5 Temperature(F): 98 Respiratory Rate(breaths/min): 16 [1:Photos:]  [N/A:N/A] Right Knee N/A N/A Wound Location: Surgical Injury N/A N/A Wounding Event: Dehisced Wound N/A N/A Primary Etiology: Cataracts, End Stage Renal Disease, N/A N/A Comorbid History: Osteoarthritis 01/24/2022 N/A N/A Date Acquired: 6 N/A N/A Weeks of Treatment: Open N/A N/A Wound Status: No N/A N/A Wound Recurrence: 0.6x0.3x0.1 N/A N/A Measurements L x W x D (cm) 0.141 N/A N/A A (cm) : rea 0.014 N/A N/A Volume (cm) : 97.70% N/A N/A % Reduction in A rea: 99.40% N/A N/A % Reduction in Volume: Full Thickness Without Exposed N/A N/A Classification: Support Structures Medium N/A N/A Exudate A mount: Serosanguineous N/A N/A Exudate Type: red, brown N/A N/A  Exudate Color: Distinct, outline attached N/A N/A Wound Margin: Medium (34-66%) N/A N/A Granulation A mount: Pink N/A N/A Granulation Quality: Medium (34-66%) N/A N/A Necrotic A mount: Eschar, Adherent Slough N/A N/A Necrotic Tissue: Fat Layer (Subcutaneous Tissue): Yes N/A N/A Exposed Structures: Fascia: No Tendon: No Muscle: No Joint: No Bone: No Small (1-33%) N/A N/A Epithelialization: Debridement - Selective/Open Wound N/A N/A Debridement: Pre-procedure Verification/Time Out 14:25 N/A N/A Taken: Lidocaine 5% topical ointment N/A N/A Pain Control: Necrotic/Eschar, Slough N/A N/A Tissue Debrided: Non-Viable Tissue N/A N/A Level: 0.18 N/A N/A Debridement A (sq cm): rea Curette N/A N/A Instrument: Minimum N/A N/A Bleeding: Pressure N/A N/A Hemostasis A chieved: 0 N/A N/A Procedural Pain: 0 N/A N/A Post Procedural Pain: Procedure was tolerated well N/A N/A Debridement Treatment Response: 0.6x0.3x0.1 N/A N/A Post Debridement Measurements L x W x D (cm) 0.014 N/A N/A Post Debridement Volume: (cm) Scarring: Yes N/A N/A Periwound Skin Texture: No Abnormalities Noted N/A N/A Periwound Skin Moisture: No Abnormalities Noted N/A N/A Periwound Skin Color: No Abnormality N/A  N/A Temperature: Debridement N/A N/A Procedures Performed: Treatment Notes Wound #1 (Knee) Wound Laterality: Right Cleanser Soap and Water Discharge Instruction: May shower and wash wound with dial antibacterial soap and water prior to dressing change. Wound Cleanser Discharge Instruction: Cleanse the wound with wound cleanser prior to applying a clean dressing using gauze sponges, not tissue or cotton balls. Peri-Wound Care Topical Primary Dressing Hydrofera Blue Ready Transfer Foam, 4x5 (in/in) Discharge Instruction: Apply to wound bed , moisten with Hydrogel Secondary Dressing Bordered Gauze, 4x4 in Discharge Instruction: Apply over primary dressing as directed. Secured With Compression Wrap Compression Stockings Add-Ons ERIELLE, GAWRONSKI ([id number]) [id number] Page 4 of 7 Electronic Signature(s) Signed: 05/23/2022 3:27:44 PM By: Fredirick Maudlin MD FACS Entered By: Fredirick Maudlin on 05/23/2022 15:27:43 -------------------------------------------------------------------------------- Multi-Disciplinary Care Plan Details Patient Name: Date of Service: Joline Maxcy 05/23/2022 2:15 PM Medical Record Number: [id number] Patient Account Number: [id number] Date of Birth/Sex: Treating RN: Jun 10, 1950 (72 y.o. America Brown Primary Care Darthy Manganelli: Cletis [place] Other Clinician: Referring Clebert Wenger: Treating Omarie Parcell/Extender: Colvin Caroli in Treatment: 6 Active Inactive Wound/Skin Impairment Nursing Diagnoses: Impaired tissue integrity Goals: Patient/caregiver will verbalize understanding of skin care regimen Date Initiated: 04/11/2022 Target Resolution Date: 06/29/2022 Goal Status: Active Interventions: Assess ulceration(s) every visit Treatment Activities: Skin care regimen initiated : 04/11/2022 Notes: Electronic Signature(s) Signed: 05/23/2022 3:28:26 PM By: Dellie Catholic RN Entered By: Dellie Catholic on  05/23/2022 14:27:10 -------------------------------------------------------------------------------- Pain Assessment Details Patient Name: Date of Service: QUINCEY, NORED 05/23/2022 2:15 PM Medical Record Number: [id number] Patient Account Number: [id number] Date of Birth/Sex: Treating RN: [birth year] (72 y.o. America Brown Primary Care Ahsley Attwood: Cletis [place] Other Clinician: Referring Suleman Gunning: Treating Charly Hunton/Extender: Colvin Caroli in Treatment: 6 Active Problems Location of Pain Severity and Description of Pain Patient Has Paino No Site Locations [place], [place] ([id number]) [id number] Page 5 of 7 Pain Management and Medication Current Pain Management: Electronic Signature(s) Signed: 05/23/2022 3:28:26 PM By: Dellie Catholic RN Entered By: Dellie Catholic on 05/23/2022 14:19:23 -------------------------------------------------------------------------------- Patient/Caregiver Education Details Patient Name: Date of Service: Joline Maxcy 1/23/2024andnbsp2:15 PM Medical Record Number: [id number] Patient Account Number: [id number] Date of Birth/Gender: Treating RN: 1950/05/04 (72 y.o. America Brown Primary Care Physician: Cletis [place] Other Clinician: Referring Physician: Treating Physician/Extender: Colvin Caroli in Treatment: 6 Education Assessment Education Provided To: Patient Education Topics Provided Wound/Skin Impairment: Methods: Explain/Verbal Responses: Return demonstration correctly Electronic Signature(s)  Signed: 05/23/2022 3:28:26 PM By: Dellie Catholic RN Entered By: Dellie Catholic on 05/23/2022 14:27:38 -------------------------------------------------------------------------------- Wound Assessment Details Patient Name: Date of Service: DELBERTA, FOLTS 05/23/2022 2:15 PM Medical Record Number: [id number] Patient Account Number: [id number] Date of Birth/Sex:  Treating RN: 05/08/1950 (72 y.o. America Brown Primary Care Joniah Bednarski: Cletis [place] Other Clinician: Referring Yuktha Kerchner: Treating Garvin Ellena/Extender: Jake Michaelis Loving, Jomarie Longs ([id number]) [phone].pdf Page 6 of 7 Weeks in Treatment: 6 Wound Status Wound Number: 1 Primary Etiology: Dehisced Wound Wound Location: Right Knee Wound Status: Open Wounding Event: Surgical Injury Comorbid History: Cataracts, End Stage Renal Disease, Osteoarthritis Date Acquired: 01/24/2022 Weeks Of Treatment: 6 Clustered Wound: No Photos Wound Measurements Length: (cm) 0.6 Width: (cm) 0.3 Depth: (cm) 0.1 Area: (cm) 0.141 Volume: (cm) 0.014 % Reduction in Area: 97.7% % Reduction in Volume: 99.4% Epithelialization: Small (1-33%) Tunneling: No Undermining: No Wound Description Classification: Full Thickness Without Exposed Support Structures Wound Margin: Distinct, outline attached Exudate Amount: Medium Exudate Type: Serosanguineous Exudate Color: red, brown Foul Odor After Cleansing: No Slough/Fibrino Yes Wound Bed Granulation Amount: Medium (34-66%) Exposed Structure Granulation Quality: Pink Fascia Exposed: No Necrotic Amount: Medium (34-66%) Fat Layer (Subcutaneous Tissue) Exposed: Yes Necrotic Quality: Eschar, Adherent Slough Tendon Exposed: No Muscle Exposed: No Joint Exposed: No Bone Exposed: No Periwound Skin Texture Texture Color No Abnormalities Noted: No No Abnormalities Noted: Yes Scarring: Yes Temperature / Pain Temperature: No Abnormality Moisture No Abnormalities Noted: Yes Treatment Notes Wound #1 (Knee) Wound Laterality: Right Cleanser Soap and Water Discharge Instruction: May shower and wash wound with dial antibacterial soap and water prior to dressing change. Wound Cleanser Discharge Instruction: Cleanse the wound with wound cleanser prior to applying a clean dressing using gauze sponges, not tissue or cotton  balls. Peri-Wound Care Topical Primary Dressing Hydrofera Blue Ready Transfer Foam, 4x5 (in/in) Discharge Instruction: Apply to wound bed , moisten with Hydrogel Secondary Dressing SKYLEN, DANIELSEN ([id number]) [phone].pdf Page 7 of 7 Bordered Gauze, 4x4 in Discharge Instruction: Apply over primary dressing as directed. Secured With Compression Wrap Compression Stockings Environmental education officer) Signed: 05/23/2022 3:28:26 PM By: Dellie Catholic RN Entered By: Dellie Catholic on 05/23/2022 14:22:41 -------------------------------------------------------------------------------- Vitals Details Patient Name: Date of Service: Joline Maxcy 05/23/2022 2:15 PM Medical Record Number: [id number] Patient Account Number: [id number] Date of Birth/Sex: Treating RN: 09/24/1950 (72 y.o. America Brown Primary Care Yale Golla: Cletis [place] Other Clinician: Referring Alfreda Hammad: Treating Calli Bashor/Extender: Colvin Caroli in Treatment: 6 Vital Signs Time Taken: 14:18 Temperature (F): 98 Height (in): 65 Pulse (bpm): 69 Weight (lbs): 165 Respiratory Rate (breaths/min): 16 Body Mass Index (BMI): 27.5 Blood Pressure (mmHg): 135/58 Reference Range: 80 - 120 mg / dl Electronic Signature(s) Signed: 05/23/2022 3:28:26 PM By: Dellie Catholic RN Entered By: Dellie Catholic on 05/23/2022 14:20:42

## 2022-05-26 ENCOUNTER — Ambulatory Visit (HOSPITAL_COMMUNITY)
Admission: RE | Admit: 2022-05-26 | Discharge: 2022-05-26 | Disposition: A | Discharge status: Home / Self Care | Payer: Medicare PPO | Source: Ambulatory Visit | Attending: Internal Medicine | Admitting: Internal Medicine

## 2022-05-26 DIAGNOSIS — Z940 Kidney transplant status: Secondary | ICD-10-CM | POA: Insufficient documentation

## 2022-05-26 LAB — URINALYSIS, COMPLETE (UACMP) WITH MICROSCOPIC
Bilirubin Urine: NEGATIVE
Glucose, UA: NEGATIVE mg/dL
Hgb urine dipstick: NEGATIVE
Ketones, ur: NEGATIVE mg/dL
Leukocytes,Ua: NEGATIVE
Nitrite: NEGATIVE
Protein, ur: NEGATIVE mg/dL
Specific Gravity, Urine: 1.017 (ref 1.005–1.030)
pH: 5 (ref 5.0–8.0)

## 2022-05-26 LAB — CBC WITH DIFFERENTIAL/PLATELET
Abs Immature Granulocytes: 0.1 10*3/uL — ABNORMAL HIGH (ref 0.00–0.07)
Basophils Absolute: 0 10*3/uL (ref 0.0–0.1)
Basophils Relative: 0 %
Eosinophils Absolute: 0.1 10*3/uL (ref 0.0–0.5)
Eosinophils Relative: 3 %
HCT: 39.5 % (ref 36.0–46.0)
Hemoglobin: 12.3 g/dL (ref 12.0–15.0)
Immature Granulocytes: 4 %
Lymphocytes Relative: 36 %
Lymphs Abs: 1 10*3/uL (ref 0.7–4.0)
MCH: 28.4 pg (ref 26.0–34.0)
MCHC: 31.1 g/dL (ref 30.0–36.0)
MCV: 91.2 fL (ref 80.0–100.0)
Monocytes Absolute: 0.4 10*3/uL (ref 0.1–1.0)
Monocytes Relative: 17 %
Neutro Abs: 1.1 10*3/uL — ABNORMAL LOW (ref 1.7–7.7)
Neutrophils Relative %: 40 %
Platelets: 174 10*3/uL (ref 150–400)
RBC: 4.33 MIL/uL (ref 3.87–5.11)
RDW: 15.1 % (ref 11.5–15.5)
WBC: 2.7 10*3/uL — ABNORMAL LOW (ref 4.0–10.5)
nRBC: 0 % (ref 0.0–0.2)

## 2022-05-26 LAB — BASIC METABOLIC PANEL
Anion gap: 9 (ref 5–15)
BUN: 22 mg/dL (ref 8–23)
CO2: 22 mmol/L (ref 22–32)
Calcium: 9.7 mg/dL (ref 8.9–10.3)
Chloride: 110 mmol/L (ref 98–111)
Creatinine, Ser: 0.89 mg/dL (ref 0.44–1.00)
GFR, Estimated: >60 mL/min (ref >60)
Glucose, Bld: 146 mg/dL — ABNORMAL HIGH (ref 70–99)
Potassium: 5.1 mmol/L (ref 3.5–5.1)
Sodium: 141 mmol/L (ref 135–145)

## 2022-05-26 LAB — PROTEIN / CREATININE RATIO, URINE
Creatinine, Urine: 97 mg/dL
Protein Creatinine Ratio: 0.13 mg/mg{Cre} (ref 0.00–0.15)
Total Protein, Urine: 13 mg/dL

## 2022-05-26 MED ORDER — SODIUM CHLORIDE 0.9 % IV SOLN
5.0000 mg/kg | INTRAVENOUS | Status: DC
  Administered 2022-05-26: 362.5 mg via INTRAVENOUS
  Filled 2022-05-26: qty 362.5

## 2022-05-29 LAB — SIROLIMUS LEVEL: Sirolimus (Rapamycin): 5.1 ng/mL (ref 3.0–20.0)

## 2022-05-30 ENCOUNTER — Encounter (HOSPITAL_BASED_OUTPATIENT_CLINIC_OR_DEPARTMENT_OTHER): Payer: Medicare PPO | Admitting: General Surgery

## 2022-05-30 DIAGNOSIS — T8131XA Disruption of external operation (surgical) wound, not elsewhere classified, initial encounter: Secondary | ICD-10-CM | POA: Diagnosis not present

## 2022-05-30 NOTE — Progress Notes (Signed)
Denise Bush ([id number]) [id number] Page 1 of 8 Visit Report for 05/30/2022 Arrival Information Details Patient Name: Date of Service: Denise Bush, Denise Bush 05/30/2022 1:30 PM Medical Record Number: [id number] Patient Account Number: [id number] Date of Birth/Sex: Treating RN: Denise Bush (72 y.o. Denise Bush, Denise Bush Primary Care Denise Bush: Denise Bush Other Clinician: Referring Denise Bush: Treating Denise Bush/Extender: Denise Bush in Treatment: 7 Visit Information History Since Last Visit Added or deleted any medications: No Patient Arrived: Ambulatory Any new allergies or adverse reactions: No Arrival Time: 13:23 Had a fall or experienced change in No Accompanied By: husband activities of daily living that may affect Transfer Assistance: None risk of falls: Patient Identification Verified: Yes Signs or symptoms of abuse/neglect since last visito No Secondary Verification Process Completed: Yes Hospitalized since last visit: No Patient Requires Transmission-Based Precautions: No Implantable device outside of the clinic excluding No Patient Has Alerts: No cellular tissue based products placed in the center since last visit: Has Dressing in Place as Prescribed: Yes Pain Present Now: No Electronic Signature(s) Signed: 05/30/2022 3:55:15 PM By: Denise Bush Entered By: Denise Bush on 05/30/2022 13:27:31 -------------------------------------------------------------------------------- Clinic Level of Care Assessment Details Patient Name: Date of Service: Denise Bush, Denise Bush 05/30/2022 1:30 PM Medical Record Number: [id number] Patient Account Number: [id number] Date of Birth/Sex: Treating RN: [birth year] Bush (72 y.o. Denise Bush Primary Care Denise Bush: Denise Bush Other Clinician: Referring Denise Bush: Treating Denise Bush/Extender: Denise Bush in Treatment: 7 Clinic Level of Care Assessment  Items TOOL 4 Quantity Score X- 1 0 Use when only an EandM is performed on FOLLOW-UP visit ASSESSMENTS - Nursing Assessment / Reassessment X- 1 10 Reassessment of Co-morbidities (includes updates in patient status) X- 1 5 Reassessment of Adherence to Treatment Plan ASSESSMENTS - Wound and Skin A ssessment / Reassessment X - Simple Wound Assessment / Reassessment - one wound 1 5 []  - 0 Complex Wound Assessment / Reassessment - multiple wounds []  - 0 Dermatologic / Skin Assessment (not related to wound area) ASSESSMENTS - Focused Assessment []  - 0 Circumferential Edema Measurements - multi extremities []  - 0 Nutritional Assessment / Counseling / Intervention Denise Bush, Denise Bush ([id number]) [id number] Page 2 of 8 []  - 0 Lower Extremity Assessment (monofilament, tuning fork, pulses) []  - 0 Peripheral Arterial Disease Assessment (using hand held doppler) ASSESSMENTS - Ostomy and/or Continence Assessment and Care []  - 0 Incontinence Assessment and Management []  - 0 Ostomy Care Assessment and Management (repouching, etc.) PROCESS - Coordination of Care X - Simple Patient / Family Education for ongoing care 1 15 []  - 0 Complex (extensive) Patient / Family Education for ongoing care X- 1 10 Staff obtains Programmer, systems, Records, T Results / Process Orders est []  - 0 Staff telephones HHA, Nursing Homes / Clarify orders / etc []  - 0 Routine Transfer to another Facility (non-emergent condition) []  - 0 Routine Hospital Admission (non-emergent condition) []  - 0 New Admissions / Biomedical engineer / Ordering NPWT Apligraf, etc. , []  - 0 Emergency Hospital Admission (emergent condition) X- 1 10 Simple Discharge Coordination []  - 0 Complex (extensive) Discharge Coordination PROCESS - Special Needs []  - 0 Pediatric / Minor Patient Management []  - 0 Isolation Patient Management []  - 0 Hearing / Language / Visual special needs []  - 0 Assessment of  Community assistance (transportation, D/C planning, etc.) []  - 0 Additional assistance / Altered mentation []  - 0 Support Surface(s) Assessment (bed, cushion, seat, etc.) INTERVENTIONS - Wound Cleansing / Measurement []  - 0 Simple Wound Cleansing -  one wound []  - 0 Complex Wound Cleansing - multiple wounds X- 1 5 Wound Imaging (photographs - any number of wounds) []  - 0 Wound Tracing (instead of photographs) []  - 0 Simple Wound Measurement - one wound []  - 0 Complex Wound Measurement - multiple wounds INTERVENTIONS - Wound Dressings []  - 0 Small Wound Dressing one or multiple wounds []  - 0 Medium Wound Dressing one or multiple wounds []  - 0 Large Wound Dressing one or multiple wounds []  - 0 Application of Medications - topical []  - 0 Application of Medications - injection INTERVENTIONS - Miscellaneous []  - 0 External ear exam []  - 0 Specimen Collection (cultures, biopsies, blood, body fluids, etc.) []  - 0 Specimen(s) / Culture(s) sent or taken to Lab for analysis []  - 0 Patient Transfer (multiple staff / Civil Service fast streamer / Similar devices) []  - 0 Simple Staple / Suture removal (25 or less) []  - 0 Complex Staple / Suture removal (26 or more) []  - 0 Hypo / Hyperglycemic Management (close monitor of Blood Glucose) Denise Bush ([id number]) [id number] Page 3 of 8 []  - 0 Ankle / Brachial Index (ABI) - do not check if billed separately X- 1 5 Vital Signs Has the patient been seen at the hospital within the last three years: Yes Total Score: 65 Level Of Care: New/Established - Level 2 Electronic Signature(s) Signed: 05/30/2022 3:55:15 PM By: Denise Bush Entered By: Denise Bush on 05/30/2022 13:32:17 -------------------------------------------------------------------------------- Encounter Discharge Information Details Patient Name: Date of Service: Denise Bush 05/30/2022 1:30 PM Medical Record Number: [id number] Patient  Account Number: [id number] Date of Birth/Sex: Treating RN: [birth year]Bush (72 y.o. Denise Bush Primary Care Denise Bush: Denise Bush Other Clinician: Referring Denise Bush: Treating Denise Bush/Extender: Denise Bush in Treatment: 7 Encounter Discharge Information Items Discharge Condition: Stable Ambulatory Status: Ambulatory Discharge Destination: Home Transportation: Private Auto Accompanied By: husband Schedule Follow-up Appointment: No Clinical Summary of Care: Patient Declined Electronic Signature(s) Signed: 05/30/2022 3:55:15 PM By: Denise Bush Entered By: Denise Bush on 05/30/2022 13:32:50 -------------------------------------------------------------------------------- Lower Extremity Assessment Details Patient Name: Date of Service: Denise Bush, Denise Bush 05/30/2022 1:30 PM Medical Record Number: [id number] Patient Account Number: [id number] Date of Birth/Sex: Treating RN: [birth year]ush (72 y.o. Denise Bush Primary Care Alysabeth Scalia: Denise Bush Other Clinician: Referring Kolbee Stallman: Treating Shantrell Placzek/Extender: Denise Bush in Treatment: 7 Edema Assessment Assessed: [Left: No] [Right: No] [Left: Edema] [Right: :] Calf Left: Right: Point of Measurement: From Medial Instep 28.2 cm Ankle Left: Right: Point of Measurement: From Medial Instep 18.5 cm Electronic Signature(s) Signed: 05/30/2022 3:55:15 PM By: Cheryle Horsfall ([id number]) PM By: Lebron Conners.pdf Page 4 of 8 Signed: 05/30/2022 3:55:15 aylor Entered By: Denise Bush on 05/30/2022 13:27:55 -------------------------------------------------------------------------------- Multi Wound Chart Details Patient Name: Date of Service: Denise Bush, Denise Bush 05/30/2022 1:30 PM Medical Record Number: [id number] Patient Account Number: [id number] Date of Birth/Sex: Treating RN: [birth year]Bush (72 y.o. F) Primary  Care Lothar Prehn: Denise Bush Other Clinician: Referring Mada Sadik: Treating Linn Goetze/Extender: Denise Bush in Treatment: 7 Vital Signs Height(in): 65 Pulse(bpm): 45 Weight(lbs): 165 Blood Pressure(mmHg): 136/73 Body Mass Index(BMI): 27.5 Temperature(F): 98.3 Respiratory Rate(breaths/min): 18 [1:Photos:] [N/A:N/A] Right Knee N/A N/A Wound Location: Surgical Injury N/A N/A Wounding Event: Dehisced Wound N/A N/A Primary Etiology: Cataracts, End Stage Renal Disease, N/A N/A Comorbid History: Osteoarthritis 01/24/2022 N/A N/A Date Acquired: 7 N/A N/A Weeks of Treatment: Open N/A N/A Wound Status: No N/A N/A Wound Recurrence: 0x0x0 N/A N/A Measurements L x  W x D (cm) 0 N/A N/A A (cm) : rea 0 N/A N/A Volume (cm) : 100.00% N/A N/A % Reduction in Area: 100.00% N/A N/A % Reduction in Volume: Full Thickness Without Exposed N/A N/A Classification: Support Structures None Present N/A N/A Exudate Amount: Flat and Intact N/A N/A Wound Margin: None Present (0%) N/A N/A Granulation Amount: None Present (0%) N/A N/A Necrotic Amount: Fascia: No N/A N/A Exposed Structures: Fat Layer (Subcutaneous Tissue): No Tendon: No Muscle: No Joint: No Bone: No Large (67-100%) N/A N/A Epithelialization: Scarring: Yes N/A N/A Periwound Skin Texture: No Abnormalities Noted N/A N/A Periwound Skin Moisture: No Abnormalities Noted N/A N/A Periwound Skin Color: No Abnormality N/A N/A Temperature: Treatment Notes Wound #1 (Knee) Wound Laterality: Right Cleanser Peri-Wound Care Topical NALEAH, KOFOED ([id number]) [id number] Page 5 of 8 Primary Dressing Secondary Dressing Secured With Compression Wrap Compression Stockings Add-Ons Electronic Signature(s) Signed: 05/30/2022 1:33:22 PM By: Fredirick Maudlin MD FACS Entered By: Fredirick Maudlin on 05/30/2022  13:33:22 -------------------------------------------------------------------------------- Multi-Disciplinary Care Plan Details Patient Name: Date of Service: Denise Bush 05/30/2022 1:30 PM Medical Record Number: [id number] Patient Account Number: [id number] Date of Birth/Sex: Treating RN: [birth year] Bush (72 y.o. Denise Bush Primary Care Kingstin Heims: Denise Bush Other Clinician: Referring Zacari Radick: Treating Margerite Impastato/Extender: Denise Bush in Treatment: 7 Active Inactive Electronic Signature(s) Signed: 05/30/2022 3:55:15 PM By: Sabas Sous By: Denise Bush on 05/30/2022 13:31:39 -------------------------------------------------------------------------------- Pain Assessment Details Patient Name: Date of Service: Denise Bush, Denise Bush 05/30/2022 1:30 PM Medical Record Number: [id number] Patient Account Number: [id number] Date of Birth/Sex: Treating RN: [birth year]ush (72 y.o. Denise Bush Primary Care Nandini Bogdanski: Denise Bush Other Clinician: Referring Queen Abbett: Treating Ahnika Hannibal/Extender: Denise Bush in Treatment: 7 Active Problems Location of Pain Severity and Description of Pain Patient Has Paino No Site Locations Rate the pain. KEYSHLA, TUNISON ([id number]) [id number] Page 6 of 8 Rate the pain. Current Pain Level: 0 Pain Management and Medication Current Pain Management: Electronic Signature(s) Signed: 05/30/2022 3:55:15 PM By: Denise Bush Entered By: Denise Bush on 05/30/2022 13:27:52 -------------------------------------------------------------------------------- Patient/Caregiver Education Details Patient Name: Date of Service: Denise Bush 1/30/2024andnbsp1:30 PM Medical Record Number: [id number] Patient Account Number: [id number] Date of Birth/Gender: Treating RN: [birth year] (72 y.o. Denise Bush Primary Care Physician: Denise Bush  Other Clinician: Referring Physician: Treating Physician/Extender: Denise Bush in Treatment: 7 Education Assessment Education Provided To: Patient Education Topics Provided Safety: Methods: Explain/Verbal Responses: Reinforcements needed, State content correctly Electronic Signature(s) Signed: 05/30/2022 3:55:15 PM By: Denise Bush Entered By: Denise Bush on 05/30/2022 13:31:55 -------------------------------------------------------------------------------- Wound Assessment Details Patient Name: Date of Service: Denise Bush, Denise Bush 05/30/2022 1:30 PM Medical Record Number: [id number] Patient Account Number: [id number] Date of Birth/Sex: Treating RN: [birth year]ush (72 y.o. Denise Bush Primary Care Skylene Deremer: Denise Bush Other Clinician: Referring Ermal Brzozowski: Treating Xzavior Reinig/Extender: Jake Michaelis [place], Jomarie Longs ([id number]) [id number] Page 7 of 8 Weeks in Treatment: 7 Wound Status Wound Number: 1 Primary Etiology: Dehisced Wound Wound Location: Right Knee Wound Status: Open Wounding Event: Surgical Injury Comorbid History: Cataracts, End Stage Renal Disease, Osteoarthritis Date Acquired: 01/24/2022 Weeks Of Treatment: 7 Clustered Wound: No Photos Wound Measurements Length: (cm) Width: (cm) Depth: (cm) Area: (cm) Volume: (cm) 0 % Reduction in Area: 100% 0 % Reduction in Volume: 100% 0 Epithelialization: Large (67-100%) 0 Tunneling: No 0 Undermining: No Wound Description Classification: Full Thickness Without Exposed Suppo Wound Margin: Flat and Intact Exudate Amount: None Present rt Structures Foul Odor  After Cleansing: No Slough/Fibrino No Wound Bed Granulation Amount: None Present (0%) Exposed Structure Necrotic Amount: None Present (0%) Fascia Exposed: No Fat Layer (Subcutaneous Tissue) Exposed: No Tendon Exposed: No Muscle Exposed: No Joint Exposed: No Bone  Exposed: No Periwound Skin Texture Texture Color No Abnormalities Noted: No No Abnormalities Noted: Yes Scarring: Yes Temperature / Pain Temperature: No Abnormality Moisture No Abnormalities Noted: Yes Electronic Signature(s) Signed: 05/30/2022 3:55:15 PM By: Denise Bush Entered By: Denise Bush on 05/30/2022 13:29:10 -------------------------------------------------------------------------------- [place] Details Patient Name: Date of Service: Denise Bush. 05/30/2022 1:30 PM Medical Record Number: [id number] Patient Account Number: [id number] Date of Birth/Sex: Treating RN: [birth year]Bush (72 y.o. Denise Bush Primary Care Jeanetta Alonzo: Denise Bush Other Clinician: Referring Karlisa Gaubert: Treating Joshia Kitchings/Extender: Denise Bush in Treatment: [address], [place] ([id number]) [id number] Page 8 of 8 Vital Signs Time Taken: 13:27 Temperature (F): 98.3 Height (in): 65 Pulse (bpm): 66 Weight (lbs): 165 Respiratory Rate (breaths/min): 18 Body Mass Index (BMI): 27.5 Blood Pressure (mmHg): 136/73 Reference Range: 80 - 120 mg / dl Electronic Signature(s) Signed: 05/30/2022 3:55:15 PM By: Denise Bush Entered By: Denise Bush on 05/30/2022 13:27:44

## 2022-05-30 NOTE — Progress Notes (Signed)
Denise Bush, Denise Bush ([id number]) [id number] Page 1 of 7 Visit Report for 05/30/2022 Chief Complaint Document Details Patient Name: Date of Service: Denise Bush, Denise Bush 05/30/2022 1:30 PM Medical Record Number: [id number] Patient Account Number: [id number] Date of Birth/Sex: Treating RN: 1950-08-31 (72 y.o. F) Primary Care Provider: Cletis Athens Other Clinician: Referring Provider: Treating Provider/Extender: Colvin Caroli in Treatment: 7 Information Obtained from: Patient Chief Complaint 04/11/2022; patient is here for a nonhealing surgical wound on her right knee Electronic Signature(s) Signed: 05/30/2022 1:33:39 PM By: Fredirick Maudlin MD FACS Entered By: Fredirick Maudlin on 05/30/2022 13:33:39 -------------------------------------------------------------------------------- HPI Details Patient Name: Date of Service: Denise Bush 05/30/2022 1:30 PM Medical Record Number: [id number] Patient Account Number: [id number] Date of Birth/Sex: Treating RN: 1950/09/17 (72 y.o. F) Primary Care Provider: Cletis Athens Other Clinician: Referring Provider: Treating Provider/Extender: Colvin Caroli in Treatment: 7 History of Present Illness HPI Description: ADMISSION 04/11/2022 This is a pleasant 72 year old woman accompanied by her husband. She has a history of type 2 diabetes reasonably well-controlled status post kidney transplant in 2019. She had a right total knee replacement towards the end of September of this year. According to the patient this never really healed they used surgical glue for adhesion that was not sutured. She simply says it never closed. This has been followed by orthopedic surgery and they have referred her here apparently she has been using Vaseline gauze. The wound is 6 x 1.3 x 0.4 and completely eschar covered Past medical history includes type 2 diabetes with a kidney transplant in 2019. She  had a right total knee replacement on 01/21/2022. She has a history of SIADH, hypothyroidism she is legally blind. ABI in our clinic was 1.16 04/18/2022: Compared to last week's measurements and description, the wound is a little bit smaller today. It is also quite a bit cleaner. They have been using Hydrofera Blue with a foam border dressing. 04/28/2022: The wound is contracting nicely. It is a little bit dry on the surface. There is slough and eschar accumulation. No concern for infection. 05/08/2022: The wound is smaller again today. There is slough and eschar on the surface. Moisture balance is better. 05/16/2022: The wound continues to epithelialize. It is down to just 2 small open areas, both of which have some slough and eschar present. 05/23/2022: The wound is nearly closed. The more proximal area that was open last week is covered with a layer of eschar. Once this was removed, that site was found to have epithelialized. The more distal open area is smaller with some slough and eschar buildup. No concern for infection. 05/30/2022: Her wound is healed. Electronic Signature(s) Signed: 05/30/2022 1:33:53 PM By: Fredirick Maudlin MD FACS Denise Bush, Denise Bush ([id number]) PM By: Fredirick Maudlin MD FACS [phone].pdf Page 2 of 7 Signed: 05/30/2022 1:33:53 Entered By: Fredirick Maudlin on 05/30/2022 13:33:53 -------------------------------------------------------------------------------- Physical Exam Details Patient Name: Date of Service: Denise Bush, Denise Bush 05/30/2022 1:30 PM Medical Record Number: [id number] Patient Account Number: [id number] Date of Birth/Sex: Treating RN: 1950/07/21 (72 y.o. F) Primary Care Provider: Cletis Athens Other Clinician: Referring Provider: Treating Provider/Extender: Colvin Caroli in Treatment: 7 Constitutional . . . . no acute distress. Notes 05/30/2022: Her wound is healed. Electronic Signature(s) Signed: 05/30/2022  1:34:33 PM By: Fredirick Maudlin MD FACS Entered By: Fredirick Maudlin on 05/30/2022 13:34:33 -------------------------------------------------------------------------------- Physician Orders Details Patient Name: Date of Service: Denise Bush 05/30/2022 1:30 PM Medical Record Number: [id number] Patient Account Number: [id number]  Date of Birth/Sex: Treating RN: 17-Apr-1951 (72 y.o. Harlow Ohms Primary Care Provider: Cletis Athens Other Clinician: Referring Provider: Treating Provider/Extender: Colvin Caroli in Treatment: 7 Verbal / Phone Orders: No Diagnosis Coding ICD-10 Coding Code Description S81.001D Unspecified open wound, right knee, subsequent encounter T81.31XD Disruption of external operation (surgical) wound, not elsewhere classified, subsequent encounter E11.622 Type 2 diabetes mellitus with other skin ulcer Z94.0 Kidney transplant status Discharge From [facility] Services Discharge from [place]!!!!!!! Edema Control - Lymphedema / SCD / Other Moisturize legs daily. Electronic Signature(s) Signed: 05/30/2022 1:34:42 PM By: Fredirick Maudlin MD FACS Entered By: Fredirick Maudlin on 05/30/2022 13:34:42 Sheral Apley ([id number]) [id number] Page 3 of 7 -------------------------------------------------------------------------------- Problem List Details Patient Name: Date of Service: Denise Bush, Denise Bush 05/30/2022 1:30 PM Medical Record Number: [id number] Patient Account Number: [id number] Date of Birth/Sex: Treating RN: 10/18/1950 (72 y.o. F) Primary Care Provider: Cletis Athens Other Clinician: Referring Provider: Treating Provider/Extender: Colvin Caroli in Treatment: 7 Active Problems ICD-10 Encounter Code Description Active Date MDM Diagnosis S81.001D Unspecified open wound, right knee, subsequent encounter 04/11/2022 No Yes T81.31XD Disruption of external  operation (surgical) wound, not elsewhere classified, 04/11/2022 No Yes subsequent encounter E11.622 Type 2 diabetes mellitus with other skin ulcer 04/11/2022 No Yes Z94.0 Kidney transplant status 04/11/2022 No Yes Inactive Problems Resolved Problems Electronic Signature(s) Signed: 05/30/2022 1:33:16 PM By: Fredirick Maudlin MD FACS Entered By: Fredirick Maudlin on 05/30/2022 13:33:16 -------------------------------------------------------------------------------- Progress Note Details Patient Name: Date of Service: Denise Bush 05/30/2022 1:30 PM Medical Record Number: [id number] Patient Account Number: [id number] Date of Birth/Sex: Treating RN: Jan 29, 1951 (72 y.o. F) Primary Care Provider: Cletis Athens Other Clinician: Referring Provider: Treating Provider/Extender: Colvin Caroli in Treatment: 7 Subjective Chief Complaint Information obtained from Patient 04/11/2022; patient is here for a nonhealing surgical wound on her right knee History of Present Illness (HPI) ADMISSION 04/11/2022 This is a pleasant 72 year old woman accompanied by her husband. She has a history of type 2 diabetes reasonably well-controlled status post kidney Denise Bush, Denise Bush ([id number]) [id number] Page 4 of 7 transplant in 2019. She had a right total knee replacement towards the end of September of this year. According to the patient this never really healed they used surgical glue for adhesion that was not sutured. She simply says it never closed. This has been followed by orthopedic surgery and they have referred her here apparently she has been using Vaseline gauze. The wound is 6 x 1.3 x 0.4 and completely eschar covered Past medical history includes type 2 diabetes with a kidney transplant in 2019. She had a right total knee replacement on 01/21/2022. She has a history of SIADH, hypothyroidism she is legally blind. ABI in our clinic was  1.16 04/18/2022: Compared to last week's measurements and description, the wound is a little bit smaller today. It is also quite a bit cleaner. They have been using Hydrofera Blue with a foam border dressing. 04/28/2022: The wound is contracting nicely. It is a little bit dry on the surface. There is slough and eschar accumulation. No concern for infection. 05/08/2022: The wound is smaller again today. There is slough and eschar on the surface. Moisture balance is better. 05/16/2022: The wound continues to epithelialize. It is down to just 2 small open areas, both of which have some slough and eschar present. 05/23/2022: The wound is nearly closed. The more proximal area that was open last week is covered with a layer  of eschar. Once this was removed, that site was found to have epithelialized. The more distal open area is smaller with some slough and eschar buildup. No concern for infection. 05/30/2022: Her wound is healed. Patient History Information obtained from Patient. Family History Unknown History. Social History Never smoker, Marital Status - Married, Alcohol Use - Never, Drug Use - No History, Caffeine Use - Rarely. Medical History Eyes Patient has history of Cataracts Genitourinary Patient has history of End Stage Renal Disease - Was On Dialysis Musculoskeletal Patient has history of Osteoarthritis - Knees Hospitalization/Surgery History - Right T knee arthroplasty. otal Medical A Surgical History Notes nd Eyes Hx: Diabetic Retinopathy Hematologic/Lymphatic Hx: Blood Transfusions Cardiovascular Hx: Hashimoto's disease Endocrine Hx: Hyperparathyroidism secondary to renal;Hypothyroidism Diabetes Mellitus Type 2 (35 years) Genitourinary Hx: Of Right Kidney transplant Objective Constitutional no acute distress. Vitals Time Taken: 1:27 PM, Height: 65 in, Weight: 165 lbs, BMI: 27.5, Temperature: 98.3 F, Pulse: 66 bpm, Respiratory Rate: 18 breaths/min, Blood Pressure: 136/73  mmHg. General Notes: 05/30/2022: Her wound is healed. Integumentary (Hair, Skin) Wound #1 status is Open. Original cause of wound was Surgical Injury. The date acquired was: 01/24/2022. The wound has been in treatment 7 weeks. The wound is located on the Right Knee. The wound measures 0cm length x 0cm width x 0cm depth; 0cm^2 area and 0cm^3 volume. There is no tunneling or undermining noted. There is a none present amount of drainage noted. The wound margin is flat and intact. There is no granulation within the wound bed. There is no necrotic tissue within the wound bed. The periwound skin appearance had no abnormalities noted for moisture. The periwound skin appearance had no abnormalities noted for color. The periwound skin appearance exhibited: Scarring. Periwound temperature was noted as No Abnormality. Assessment Denise Bush, Denise Bush ([id number]) [id number] Page 5 of 7 Active Problems ICD-10 Unspecified open wound, right knee, subsequent encounter Disruption of external operation (surgical) wound, not elsewhere classified, subsequent encounter Type 2 diabetes mellitus with other skin ulcer Kidney transplant status Plan Discharge From [facility] Services: Discharge from [place]!!!!!!! Edema Control - Lymphedema / SCD / Other: Moisturize legs daily. 05/30/2022: Her wound is healed. The skin is very dry and I have recommended that she apply a rich emollient agent such as cocoa butter, Shea butter or Aquaphor to the site on a regular basis. We will discharge her from the wound care center. She may follow-up as needed. Electronic Signature(s) Signed: 05/30/2022 1:35:15 PM By: Fredirick Maudlin MD FACS Entered By: Fredirick Maudlin on 05/30/2022 13:35:15 -------------------------------------------------------------------------------- HxROS Details Patient Name: Date of Service: Denise Bush 05/30/2022 1:30 PM Medical Record Number:  [id number] Patient Account Number: [id number] Date of Birth/Sex: Treating RN: August 03, 1950 (72 y.o. 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;Hypothyroidism Diabetes Mellitus Type 2 (35 years) Genitourinary Medical History: Positive for: End Stage Renal Disease - Was On Dialysis Past Medical History NotesALAINAH, Denise Bush ([id number]) [id number] Page 6 of 7 Hx: Of Right Kidney transplant Musculoskeletal Medical History: Positive for: Osteoarthritis - Knees HBO Extended History Items Eyes: Cataracts Immunizations Pneumococcal Vaccine: Received Pneumococcal Vaccination: Yes Received Pneumococcal Vaccination On  or After 60th Birthday: Yes Implantable Devices Yes Hospitalization / Surgery History Type of Hospitalization/Surgery Right T knee arthroplasty otal Family and Social History Unknown History: Yes; Never smoker; Marital Status - Married; Alcohol Use: Never; Drug Use: No History; Caffeine Use: Rarely; Financial Concerns: No; Food, Clothing or Shelter Needs: No; Support System Lacking: No; Transportation Concerns: No Engineer, maintenance) Signed: 05/30/2022 3:42:54 PM By: Fredirick Maudlin MD FACS Entered By: Fredirick Maudlin on 05/30/2022 13:34:10 -------------------------------------------------------------------------------- SuperBill Details Patient Name: Date of Service: Denise Bush 05/30/2022 Medical Record  Number: [id number] Patient Account Number: [id number] Date of Birth/Sex: Treating RN: 16-Feb-1951 (72 y.o. Harlow Ohms Primary Care Provider: Cletis Athens Other Clinician: Referring Provider: Treating Provider/Extender: Colvin Caroli in Treatment: 7 Diagnosis Coding ICD-10 Codes Code Description S81.001D Unspecified open wound, right knee, subsequent encounter T81.31XD Disruption of external operation (surgical) wound, not elsewhere classified, subsequent encounter E11.622 Type 2 diabetes mellitus with other skin ulcer Z94.0 Kidney transplant status Facility Procedures : CPT4 Code: [id number] Description: 97741 - WOUND CARE VISIT-LEV 2 EST PT Modifier: Quantity: 1 Physician Procedures : CPT4 Code Description Modifier [id number] - WC PHYS LEVEL 3 - EST PT ICD-10 Diagnosis Description S81.001D Unspecified open wound, right knee, subsequent encounter T81.31XD Disruption of external operation (surgical) wound, not elsewhere classified,  subsequent encounter E11.622 Type 2 diabetes mellitus with other skin ulcer Z94.0 Kidney transplant status Denise Bush, Denise Bush ([id number]) [id number] Quantity: 1 7 of 7 Electronic Signature(s) Signed: 05/30/2022 1:35:28 PM By: Fredirick Maudlin MD FACS Entered By: Fredirick Maudlin on 05/30/2022 13:35:28

## 2022-06-23 ENCOUNTER — Ambulatory Visit (HOSPITAL_COMMUNITY)
Admission: RE | Admit: 2022-06-23 | Discharge: 2022-06-23 | Disposition: A | Discharge status: Home / Self Care | Payer: Medicare PPO | Source: Ambulatory Visit | Attending: Internal Medicine | Admitting: Internal Medicine

## 2022-06-23 DIAGNOSIS — Z940 Kidney transplant status: Secondary | ICD-10-CM | POA: Diagnosis present

## 2022-06-23 LAB — CBC WITH DIFFERENTIAL/PLATELET
Abs Immature Granulocytes: 0.07 10*3/uL (ref 0.00–0.07)
Basophils Absolute: 0 10*3/uL (ref 0.0–0.1)
Basophils Relative: 1 %
Eosinophils Absolute: 0.1 10*3/uL (ref 0.0–0.5)
Eosinophils Relative: 3 %
HCT: 38.6 % (ref 36.0–46.0)
Hemoglobin: 12.2 g/dL (ref 12.0–15.0)
Immature Granulocytes: 3 %
Lymphocytes Relative: 36 %
Lymphs Abs: 1 10*3/uL (ref 0.7–4.0)
MCH: 28 pg (ref 26.0–34.0)
MCHC: 31.6 g/dL (ref 30.0–36.0)
MCV: 88.7 fL (ref 80.0–100.0)
Monocytes Absolute: 0.5 10*3/uL (ref 0.1–1.0)
Monocytes Relative: 17 %
Neutro Abs: 1.1 10*3/uL — ABNORMAL LOW (ref 1.7–7.7)
Neutrophils Relative %: 40 %
Platelets: 176 10*3/uL (ref 150–400)
RBC: 4.35 MIL/uL (ref 3.87–5.11)
RDW: 14.4 % (ref 11.5–15.5)
WBC: 2.7 10*3/uL — ABNORMAL LOW (ref 4.0–10.5)
nRBC: 0 % (ref 0.0–0.2)

## 2022-06-23 LAB — BASIC METABOLIC PANEL
Anion gap: 9 (ref 5–15)
BUN: 23 mg/dL (ref 8–23)
CO2: 22 mmol/L (ref 22–32)
Calcium: 9.7 mg/dL (ref 8.9–10.3)
Chloride: 108 mmol/L (ref 98–111)
Creatinine, Ser: 0.91 mg/dL (ref 0.44–1.00)
GFR, Estimated: >60 mL/min (ref >60)
Glucose, Bld: 163 mg/dL — ABNORMAL HIGH (ref 70–99)
Potassium: 4.3 mmol/L (ref 3.5–5.1)
Sodium: 139 mmol/L (ref 135–145)

## 2022-06-23 LAB — PROTEIN / CREATININE RATIO, URINE
Creatinine, Urine: 92 mg/dL
Protein Creatinine Ratio: 0.1 mg/mg{Cre} (ref 0.00–0.15)
Total Protein, Urine: 9 mg/dL

## 2022-06-23 MED ORDER — SODIUM CHLORIDE 0.9 % IV SOLN
5.0000 mg/kg | INTRAVENOUS | Status: DC
  Administered 2022-06-23: 375 mg via INTRAVENOUS
  Filled 2022-06-23: qty 375

## 2022-06-28 LAB — SIROLIMUS LEVEL: Sirolimus (Rapamycin): 5.1 ng/mL (ref 3.0–20.0)

## 2022-06-29 ENCOUNTER — Emergency Department (HOSPITAL_COMMUNITY): Payer: Medicare PPO

## 2022-06-29 ENCOUNTER — Other Ambulatory Visit: Payer: Self-pay

## 2022-06-29 ENCOUNTER — Inpatient Hospital Stay (HOSPITAL_COMMUNITY)
Admission: EM | Admit: 2022-06-29 | Discharge: 2022-07-06 | DRG: 871 | Disposition: A | Discharge status: Home Health | Payer: Medicare PPO | Attending: Internal Medicine | Admitting: Internal Medicine

## 2022-06-29 ENCOUNTER — Encounter (HOSPITAL_COMMUNITY): Payer: Self-pay

## 2022-06-29 DIAGNOSIS — R933 Abnormal findings on diagnostic imaging of other parts of digestive tract: Secondary | ICD-10-CM

## 2022-06-29 DIAGNOSIS — Z66 Do not resuscitate: Secondary | ICD-10-CM | POA: Diagnosis present

## 2022-06-29 DIAGNOSIS — N3001 Acute cystitis with hematuria: Principal | ICD-10-CM | POA: Diagnosis present

## 2022-06-29 DIAGNOSIS — Z794 Long term (current) use of insulin: Secondary | ICD-10-CM

## 2022-06-29 DIAGNOSIS — I272 Pulmonary hypertension, unspecified: Secondary | ICD-10-CM | POA: Diagnosis present

## 2022-06-29 DIAGNOSIS — I10 Essential (primary) hypertension: Secondary | ICD-10-CM | POA: Diagnosis present

## 2022-06-29 DIAGNOSIS — E8729 Other acidosis: Secondary | ICD-10-CM | POA: Insufficient documentation

## 2022-06-29 DIAGNOSIS — D696 Thrombocytopenia, unspecified: Secondary | ICD-10-CM | POA: Diagnosis present

## 2022-06-29 DIAGNOSIS — A419 Sepsis, unspecified organism: Secondary | ICD-10-CM | POA: Diagnosis present

## 2022-06-29 DIAGNOSIS — M6282 Rhabdomyolysis: Secondary | ICD-10-CM | POA: Diagnosis present

## 2022-06-29 DIAGNOSIS — B9620 Unspecified Escherichia coli [E. coli] as the cause of diseases classified elsewhere: Secondary | ICD-10-CM

## 2022-06-29 DIAGNOSIS — D84821 Immunodeficiency due to drugs: Secondary | ICD-10-CM | POA: Diagnosis present

## 2022-06-29 DIAGNOSIS — H548 Legal blindness, as defined in USA: Secondary | ICD-10-CM | POA: Diagnosis present

## 2022-06-29 DIAGNOSIS — E876 Hypokalemia: Secondary | ICD-10-CM | POA: Diagnosis not present

## 2022-06-29 DIAGNOSIS — Y830 Surgical operation with transplant of whole organ as the cause of abnormal reaction of the patient, or of later complication, without mention of misadventure at the time of the procedure: Secondary | ICD-10-CM | POA: Diagnosis present

## 2022-06-29 DIAGNOSIS — Z803 Family history of malignant neoplasm of breast: Secondary | ICD-10-CM

## 2022-06-29 DIAGNOSIS — I050 Rheumatic mitral stenosis: Secondary | ICD-10-CM | POA: Diagnosis present

## 2022-06-29 DIAGNOSIS — R7881 Bacteremia: Secondary | ICD-10-CM

## 2022-06-29 DIAGNOSIS — N179 Acute kidney failure, unspecified: Secondary | ICD-10-CM | POA: Insufficient documentation

## 2022-06-29 DIAGNOSIS — D649 Anemia, unspecified: Secondary | ICD-10-CM | POA: Diagnosis present

## 2022-06-29 DIAGNOSIS — E113599 Type 2 diabetes mellitus with proliferative diabetic retinopathy without macular edema, unspecified eye: Secondary | ICD-10-CM | POA: Diagnosis present

## 2022-06-29 DIAGNOSIS — Z888 Allergy status to other drugs, medicaments and biological substances status: Secondary | ICD-10-CM

## 2022-06-29 DIAGNOSIS — I700 Atherosclerosis of aorta: Secondary | ICD-10-CM | POA: Diagnosis present

## 2022-06-29 DIAGNOSIS — E860 Dehydration: Secondary | ICD-10-CM | POA: Diagnosis present

## 2022-06-29 DIAGNOSIS — R410 Disorientation, unspecified: Secondary | ICD-10-CM | POA: Diagnosis not present

## 2022-06-29 DIAGNOSIS — Z96651 Presence of right artificial knee joint: Secondary | ICD-10-CM | POA: Diagnosis present

## 2022-06-29 DIAGNOSIS — Z940 Kidney transplant status: Secondary | ICD-10-CM

## 2022-06-29 DIAGNOSIS — Z79623 Long term (current) use of mammalian target of rapamycin (mtor) inhibitor: Secondary | ICD-10-CM

## 2022-06-29 DIAGNOSIS — E1165 Type 2 diabetes mellitus with hyperglycemia: Secondary | ICD-10-CM | POA: Diagnosis present

## 2022-06-29 DIAGNOSIS — J9811 Atelectasis: Secondary | ICD-10-CM | POA: Diagnosis present

## 2022-06-29 DIAGNOSIS — I472 Ventricular tachycardia, unspecified: Secondary | ICD-10-CM | POA: Diagnosis not present

## 2022-06-29 DIAGNOSIS — R6520 Severe sepsis without septic shock: Secondary | ICD-10-CM | POA: Diagnosis present

## 2022-06-29 DIAGNOSIS — E1122 Type 2 diabetes mellitus with diabetic chronic kidney disease: Secondary | ICD-10-CM | POA: Diagnosis present

## 2022-06-29 DIAGNOSIS — R112 Nausea with vomiting, unspecified: Secondary | ICD-10-CM | POA: Insufficient documentation

## 2022-06-29 DIAGNOSIS — R4701 Aphasia: Secondary | ICD-10-CM | POA: Diagnosis present

## 2022-06-29 DIAGNOSIS — E872 Acidosis, unspecified: Secondary | ICD-10-CM | POA: Diagnosis present

## 2022-06-29 DIAGNOSIS — E785 Hyperlipidemia, unspecified: Secondary | ICD-10-CM | POA: Diagnosis present

## 2022-06-29 DIAGNOSIS — R0902 Hypoxemia: Secondary | ICD-10-CM | POA: Diagnosis present

## 2022-06-29 DIAGNOSIS — Z83719 Family history of colon polyps, unspecified: Secondary | ICD-10-CM

## 2022-06-29 DIAGNOSIS — N3941 Urge incontinence: Secondary | ICD-10-CM | POA: Diagnosis present

## 2022-06-29 DIAGNOSIS — N390 Urinary tract infection, site not specified: Secondary | ICD-10-CM | POA: Insufficient documentation

## 2022-06-29 DIAGNOSIS — E119 Type 2 diabetes mellitus without complications: Secondary | ICD-10-CM

## 2022-06-29 DIAGNOSIS — E039 Hypothyroidism, unspecified: Secondary | ICD-10-CM | POA: Diagnosis present

## 2022-06-29 DIAGNOSIS — Z1152 Encounter for screening for COVID-19: Secondary | ICD-10-CM

## 2022-06-29 DIAGNOSIS — D72819 Decreased white blood cell count, unspecified: Secondary | ICD-10-CM | POA: Diagnosis present

## 2022-06-29 DIAGNOSIS — N12 Tubulo-interstitial nephritis, not specified as acute or chronic: Secondary | ICD-10-CM | POA: Diagnosis present

## 2022-06-29 DIAGNOSIS — A084 Viral intestinal infection, unspecified: Secondary | ICD-10-CM | POA: Diagnosis present

## 2022-06-29 DIAGNOSIS — K529 Noninfective gastroenteritis and colitis, unspecified: Secondary | ICD-10-CM

## 2022-06-29 DIAGNOSIS — T8619 Other complication of kidney transplant: Secondary | ICD-10-CM | POA: Diagnosis present

## 2022-06-29 DIAGNOSIS — A4151 Sepsis due to Escherichia coli [E. coli]: Principal | ICD-10-CM | POA: Diagnosis present

## 2022-06-29 DIAGNOSIS — R197 Diarrhea, unspecified: Secondary | ICD-10-CM

## 2022-06-29 DIAGNOSIS — G9341 Metabolic encephalopathy: Secondary | ICD-10-CM | POA: Diagnosis present

## 2022-06-29 DIAGNOSIS — Z90710 Acquired absence of both cervix and uterus: Secondary | ICD-10-CM

## 2022-06-29 DIAGNOSIS — R821 Myoglobinuria: Secondary | ICD-10-CM | POA: Diagnosis present

## 2022-06-29 LAB — CBC WITH DIFFERENTIAL/PLATELET
Abs Immature Granulocytes: 0 10*3/uL (ref 0.00–0.07)
Band Neutrophils: 14 %
Basophils Absolute: 0 10*3/uL (ref 0.0–0.1)
Basophils Relative: 0 %
Eosinophils Absolute: 0 10*3/uL (ref 0.0–0.5)
Eosinophils Relative: 0 %
HCT: 43.6 % (ref 36.0–46.0)
Hemoglobin: 13.5 g/dL (ref 12.0–15.0)
Lymphocytes Relative: 21 %
Lymphs Abs: 0.9 10*3/uL (ref 0.7–4.0)
MCH: 27.8 pg (ref 26.0–34.0)
MCHC: 31 g/dL (ref 30.0–36.0)
MCV: 89.7 fL (ref 80.0–100.0)
Monocytes Absolute: 0.2 10*3/uL (ref 0.1–1.0)
Monocytes Relative: 6 %
Neutro Abs: 3 10*3/uL (ref 1.7–7.7)
Neutrophils Relative %: 59 %
Platelets: 142 10*3/uL — ABNORMAL LOW (ref 150–400)
RBC: 4.86 MIL/uL (ref 3.87–5.11)
RDW: 14.9 % (ref 11.5–15.5)
WBC: 4.1 10*3/uL (ref 4.0–10.5)
nRBC: 0 % (ref 0.0–0.2)
nRBC: 0 /100 WBC

## 2022-06-29 LAB — URINALYSIS, ROUTINE W REFLEX MICROSCOPIC
Glucose, UA: NEGATIVE mg/dL
Ketones, ur: NEGATIVE mg/dL
Nitrite: NEGATIVE
Protein, ur: >300 mg/dL — AB
Specific Gravity, Urine: 1.025 (ref 1.005–1.030)
pH: 6 (ref 5.0–8.0)

## 2022-06-29 LAB — RESP PANEL BY RT-PCR (RSV, FLU A&B, COVID)  RVPGX2
Influenza A by PCR: NEGATIVE
Influenza B by PCR: NEGATIVE
Resp Syncytial Virus by PCR: NEGATIVE
SARS Coronavirus 2 by RT PCR: NEGATIVE

## 2022-06-29 LAB — COMPREHENSIVE METABOLIC PANEL
ALT: 19 U/L (ref 0–44)
AST: 50 U/L — ABNORMAL HIGH (ref 15–41)
Albumin: 3.7 g/dL (ref 3.5–5.0)
Alkaline Phosphatase: 81 U/L (ref 38–126)
Anion gap: 19 — ABNORMAL HIGH (ref 5–15)
BUN: 31 mg/dL — ABNORMAL HIGH (ref 8–23)
CO2: 15 mmol/L — ABNORMAL LOW (ref 22–32)
Calcium: 9.7 mg/dL (ref 8.9–10.3)
Chloride: 107 mmol/L (ref 98–111)
Creatinine, Ser: 1.62 mg/dL — ABNORMAL HIGH (ref 0.44–1.00)
GFR, Estimated: 34 mL/min — ABNORMAL LOW (ref >60)
Glucose, Bld: 142 mg/dL — ABNORMAL HIGH (ref 70–99)
Potassium: 4.3 mmol/L (ref 3.5–5.1)
Sodium: 141 mmol/L (ref 135–145)
Total Bilirubin: 0.8 mg/dL (ref 0.3–1.2)
Total Protein: 7.6 g/dL (ref 6.5–8.1)

## 2022-06-29 LAB — RENAL FUNCTION PANEL
Albumin: 3.2 g/dL — ABNORMAL LOW (ref 3.5–5.0)
Anion gap: 14 (ref 5–15)
BUN: 34 mg/dL — ABNORMAL HIGH (ref 8–23)
CO2: 17 mmol/L — ABNORMAL LOW (ref 22–32)
Calcium: 9.2 mg/dL (ref 8.9–10.3)
Chloride: 108 mmol/L (ref 98–111)
Creatinine, Ser: 1.7 mg/dL — ABNORMAL HIGH (ref 0.44–1.00)
GFR, Estimated: 32 mL/min — ABNORMAL LOW (ref >60)
Glucose, Bld: 121 mg/dL — ABNORMAL HIGH (ref 70–99)
Phosphorus: 3.7 mg/dL (ref 2.5–4.6)
Potassium: 3.7 mmol/L (ref 3.5–5.1)
Sodium: 139 mmol/L (ref 135–145)

## 2022-06-29 LAB — URINALYSIS, MICROSCOPIC (REFLEX): WBC, UA: >50 WBC/hpf (ref 0–5)

## 2022-06-29 LAB — HIV ANTIBODY (ROUTINE TESTING W REFLEX): HIV Screen 4th Generation wRfx: NONREACTIVE

## 2022-06-29 LAB — APTT: aPTT: 31 seconds (ref 24–36)

## 2022-06-29 LAB — PROTIME-INR
INR: 1.4 — ABNORMAL HIGH (ref 0.8–1.2)
Prothrombin Time: 17.1 seconds — ABNORMAL HIGH (ref 11.4–15.2)

## 2022-06-29 LAB — GLUCOSE, CAPILLARY: Glucose-Capillary: 121 mg/dL — ABNORMAL HIGH (ref 70–99)

## 2022-06-29 LAB — LACTIC ACID, PLASMA
Lactic Acid, Venous: 1.3 mmol/L (ref 0.5–1.9)
Lactic Acid, Venous: 1.6 mmol/L (ref 0.5–1.9)

## 2022-06-29 LAB — LIPASE, BLOOD: Lipase: 23 U/L (ref 11–51)

## 2022-06-29 LAB — CK: Total CK: 2935 U/L — ABNORMAL HIGH (ref 38–234)

## 2022-06-29 MED ORDER — VANCOMYCIN HCL IN DEXTROSE 1-5 GM/200ML-% IV SOLN: 1000.0000 mg | Freq: Once | INTRAVENOUS | Status: DC

## 2022-06-29 MED ORDER — METRONIDAZOLE 500 MG/100ML IV SOLN
500.0000 mg | Freq: Two times a day (BID) | INTRAVENOUS | Status: DC
  Administered 2022-06-30: 500 mg via INTRAVENOUS
  Filled 2022-06-29: qty 100

## 2022-06-29 MED ORDER — ONDANSETRON HCL 4 MG/2ML IJ SOLN
4.0000 mg | Freq: Once | INTRAMUSCULAR | Status: AC
  Administered 2022-06-29: 4 mg via INTRAVENOUS
  Filled 2022-06-29: qty 2

## 2022-06-29 MED ORDER — HEPARIN SODIUM (PORCINE) 5000 UNIT/ML IJ SOLN
5000.0000 [IU] | Freq: Three times a day (TID) | INTRAMUSCULAR | Status: DC
  Administered 2022-06-29 – 2022-07-03 (×11): 5000 [IU] via SUBCUTANEOUS
  Filled 2022-06-29 (×11): qty 1

## 2022-06-29 MED ORDER — SODIUM CHLORIDE 0.9 % IV SOLN
2.0000 g | Freq: Once | INTRAVENOUS | Status: AC
  Administered 2022-06-29: 2 g via INTRAVENOUS
  Filled 2022-06-29: qty 12.5

## 2022-06-29 MED ORDER — ACETAMINOPHEN 325 MG PO TABS
650.0000 mg | ORAL_TABLET | Freq: Four times a day (QID) | ORAL | Status: DC | PRN
  Administered 2022-06-29: 650 mg via ORAL
  Filled 2022-06-29: qty 2

## 2022-06-29 MED ORDER — INSULIN GLARGINE-YFGN 100 UNIT/ML ~~LOC~~ SOLN: 23.0000 [IU] | Freq: Every day | SUBCUTANEOUS | Status: DC

## 2022-06-29 MED ORDER — SODIUM CHLORIDE 0.9 % IV SOLN
2.0000 g | Freq: Two times a day (BID) | INTRAVENOUS | Status: DC
  Administered 2022-06-30: 2 g via INTRAVENOUS
  Filled 2022-06-29: qty 12.5

## 2022-06-29 MED ORDER — ONDANSETRON HCL 4 MG/2ML IJ SOLN
4.0000 mg | Freq: Three times a day (TID) | INTRAMUSCULAR | Status: DC | PRN
  Administered 2022-06-29 – 2022-06-30 (×2): 4 mg via INTRAVENOUS
  Filled 2022-06-29 (×2): qty 2

## 2022-06-29 MED ORDER — VANCOMYCIN VARIABLE DOSE PER UNSTABLE RENAL FUNCTION (PHARMACIST DOSING): Status: DC

## 2022-06-29 MED ORDER — ACETAMINOPHEN 650 MG RE SUPP
650.0000 mg | Freq: Four times a day (QID) | RECTAL | Status: DC | PRN
  Administered 2022-06-30 (×2): 650 mg via RECTAL
  Filled 2022-06-29 (×2): qty 1

## 2022-06-29 MED ORDER — LACTATED RINGERS IV BOLUS (SEPSIS)
1000.0000 mL | Freq: Once | INTRAVENOUS | Status: AC
  Administered 2022-06-29: 1000 mL via INTRAVENOUS

## 2022-06-29 MED ORDER — SODIUM CHLORIDE 0.9 % IV SOLN: 2.0000 g | Freq: Two times a day (BID) | INTRAVENOUS | Status: DC

## 2022-06-29 MED ORDER — ACETAMINOPHEN 325 MG PO TABS
650.0000 mg | ORAL_TABLET | Freq: Once | ORAL | Status: AC
  Administered 2022-06-29: 650 mg via ORAL
  Filled 2022-06-29: qty 2

## 2022-06-29 MED ORDER — INSULIN ASPART 100 UNIT/ML IJ SOLN: 0.0000 [IU] | Freq: Every day | INTRAMUSCULAR | Status: DC

## 2022-06-29 MED ORDER — LACTATED RINGERS IV BOLUS
1000.0000 mL | Freq: Once | INTRAVENOUS | Status: AC
  Administered 2022-06-29: 1000 mL via INTRAVENOUS

## 2022-06-29 MED ORDER — INSULIN ASPART 100 UNIT/ML IJ SOLN
0.0000 [IU] | Freq: Three times a day (TID) | INTRAMUSCULAR | Status: DC
  Administered 2022-06-30: 2 [IU] via SUBCUTANEOUS
  Administered 2022-06-30: 3 [IU] via SUBCUTANEOUS
  Administered 2022-06-30: 1 [IU] via SUBCUTANEOUS
  Administered 2022-07-01: 2 [IU] via SUBCUTANEOUS
  Administered 2022-07-01: 1 [IU] via SUBCUTANEOUS
  Administered 2022-07-01: 2 [IU] via SUBCUTANEOUS
  Administered 2022-07-02 (×2): 1 [IU] via SUBCUTANEOUS
  Administered 2022-07-03: 2 [IU] via SUBCUTANEOUS
  Administered 2022-07-03: 1 [IU] via SUBCUTANEOUS
  Administered 2022-07-03: 2 [IU] via SUBCUTANEOUS
  Administered 2022-07-04 – 2022-07-05 (×4): 1 [IU] via SUBCUTANEOUS
  Administered 2022-07-05: 2 [IU] via SUBCUTANEOUS
  Administered 2022-07-05 – 2022-07-06 (×2): 1 [IU] via SUBCUTANEOUS

## 2022-06-29 MED ORDER — LEVOTHYROXINE SODIUM 25 MCG PO TABS: 125.0000 ug | ORAL_TABLET | ORAL | Status: DC

## 2022-06-29 MED ORDER — SIROLIMUS 0.5 MG PO TABS
2.0000 mg | ORAL_TABLET | Freq: Every day | ORAL | Status: DC
  Administered 2022-06-30 – 2022-07-05 (×6): 2 mg via ORAL
  Filled 2022-06-29 (×7): qty 4

## 2022-06-29 MED ORDER — METRONIDAZOLE 500 MG/100ML IV SOLN
500.0000 mg | Freq: Once | INTRAVENOUS | Status: AC
  Administered 2022-06-29: 500 mg via INTRAVENOUS
  Filled 2022-06-29: qty 100

## 2022-06-29 MED ORDER — LACTATED RINGERS IV SOLN: INTRAVENOUS | Status: DC

## 2022-06-29 MED ORDER — ACETAMINOPHEN 325 MG PO TABS
650.0000 mg | ORAL_TABLET | Freq: Four times a day (QID) | ORAL | Status: DC | PRN
  Administered 2022-06-30 – 2022-07-05 (×3): 650 mg via ORAL
  Filled 2022-06-29 (×4): qty 2

## 2022-06-29 MED ORDER — VANCOMYCIN HCL 1500 MG/300ML IV SOLN
1500.0000 mg | Freq: Once | INTRAVENOUS | Status: AC
  Administered 2022-06-29: 1500 mg via INTRAVENOUS
  Filled 2022-06-29: qty 300

## 2022-06-29 MED ORDER — LEVOTHYROXINE SODIUM 50 MCG PO TABS
62.5000 ug | ORAL_TABLET | ORAL | Status: DC
  Administered 2022-06-30 – 2022-07-06 (×4): 62.5 ug via ORAL
  Filled 2022-06-29 (×4): qty 1

## 2022-06-29 NOTE — ED Notes (Signed)
ED TO INPATIENT HANDOFF REPORT  ED Nurse Name and Phone #: Jory Sims [phone]  S Name/Age/Gender Denise Bush 72 y.o. female Room/Bed: 026C/026C  Code Status   Code Status: DNR  Home/SNF/Other Home Patient oriented to: self, place, time, and situation Is this baseline? Yes   Triage Complete: Triage complete  Chief Complaint Sepsis [facility]) [A41.9]  Triage Note Pt arrived to ED via EMS from home w/ multiple complaints: N/V, posterior headache, intermittent aphasia and slurred speech x 2 days, burning w/ urination x 1 day.  Stroke-like symptoms: LKW was 2100 on 2/27. When pt woke up on the 28th she was confused, aphasic, slurred speech and posterior headache.   EMS reports pt orthostatic w/ VS 140/60 while sitting and 98/50 while standing. EMS gave 540m NS bolus. BP was then 122/56. '4mg'$  zofran also given.   L arm restricted. Pt is blind and hx of kidney transplant.    Allergies Allergies  Allergen Reactions   Hydralazine Shortness Of Breath    Chest pain Fatigue    Azor [Amlodipine-Olmesartan] Other (See Comments)    Sharp chest pain     Benadryl [Diphenhydramine] Other (See Comments)    Unknown reaction   Camellia Other (See Comments)    Unknown reaction   Denaverine Other (See Comments)    Unknown reaction   Dyazide [Hydrochlorothiazide W-Triamterene] Swelling and Other (See Comments)    Chest pain Throat swelling   Glucophage [Metformin] Diarrhea, Itching and Nausea Only    Bowel incontinence    Hydrochlorothiazide Other (See Comments)    "contractions in throat"    Laureth Other (See Comments)    Unknown reaction   Prednisone Other (See Comments)    Elevated Glucose    Reglan [Metoclopramide] Other (See Comments)    Worsening acid reflux   Trandate [Labetalol] Other (See Comments)    Foot pain   Zestril [Lisinopril] Other (See Comments)    Severe radiating foot pain.   Aldactone [Spironolactone] Rash and Other (See Comments)    Severe radiating pain  in feet    Rogaine [Minoxidil] Rash and Other (See Comments)    Fatigue  Choking, throat contractions   Statins Rash    Fatigue    Level of Care/Admitting Diagnosis ED Disposition     ED Disposition  Admit   Condition  --   Comment  Hospital Area: [id number]]  Level of Care: Telemetry Medical [104]  [facility] equivalent level of care is available:: Yes  Covid Evaluation: Asymptomatic - no recent exposure (last 10 days) testing not required  Diagnosis: Sepsis ([facility] [[id number] Admitting Physician: LCharise Killian[[id number] Attending Physician: LCharise Killian[[id number]         B Medical/Surgery History Past Medical History:  Diagnosis Date   Allergy    Arthritis    Blood transfusion without reported diagnosis    Cataract    bilateral   Di[place] retin[place]    End stage renal disease on dialysis due to type 2 diabetes mellitus ([place] 03/22/2015   ESRD (end stage renal disease) ([facility]    Hashimoto's disease    Hyperlipidemia    Hyperparathyroidism, secondary renal ([facility] [place] 06/25/2015   Hypertension    Hypothyroid    Loss of vision    both eyes, no vision left eye and little in left    Past Surgical History:  Procedure Laterality Date   ABDOMINAL HYSTERECTOMY  BREAST BIOPSY Right 2015   benign   COLONOSCOPY     EYE SURGERY     KNEE SURGERY     TOTAL KNEE ARTHROPLASTY Right 01/24/2022   Procedure: RIGHT TOTAL KNEE ARTHROPLASTY;  Surgeon: Melrose Nakayama, MD;  Location: [facility];  Service: Orthopedics;  Laterality: Right;     A IV Location/Drains/Wounds Patient Lines/Drains/Airways Status     Active Line/Drains/Airways     Name Placement date Placement time Site Days   Peripheral IV 06/29/22 20 G Right Antecubital 06/29/22  1031  Antecubital  less than 1   Peripheral IV 06/29/22 22 G 1.75" Anterior;Right Forearm 06/29/22  1539  Forearm  less than 1            Intake/Output  Last 24 hours  Intake/Output Summary (Last 24 hours) at 06/29/2022 1816 Last data filed at 06/29/2022 1749 Gross per 24 hour  Intake 1899.94 ml  Output --  Net 1899.94 ml    Labs/Imaging Results for orders placed or performed during the hospital encounter of 06/29/22 (from the past 48 hour(s))  Urinalysis, Routine w reflex microscopic -Urine, Clean Catch     Status: Abnormal   Collection Time: 06/29/22 10:50 AM  Result Value Ref Range   Color, Urine YELLOW YELLOW   APPearance CLEAR CLEAR   Specific Gravity, Urine 1.025 1.005 - 1.030   pH 6.0 5.0 - 8.0   Glucose, UA NEGATIVE NEGATIVE mg/dL   Hgb urine dipstick LARGE (A) NEGATIVE   Bilirubin Urine SMALL (A) NEGATIVE   Ketones, ur NEGATIVE NEGATIVE mg/dL   Protein, ur >300 (A) NEGATIVE mg/dL   Nitrite NEGATIVE NEGATIVE   Leukocytes,Ua SMALL (A) NEGATIVE    Comment: Performed at [facility], [place] [address] [place], [place] [address]  Urinalysis, Microscopic (reflex)     Status: Abnormal   Collection Time: 06/29/22 10:50 AM  Result Value Ref Range   RBC / HPF 0-5 0 - 5 RBC/hpf   WBC, UA >50 0 - 5 WBC/hpf   Bacteria, UA MANY (A) NONE SEEN   Squamous Epithelial / HPF 0-5 0 - 5 /HPF   WBC Clumps PRESENT    Mucus PRESENT     Comment: Performed at [facility], [place] [address], [place], [place] [address]  Resp panel by RT-PCR (RSV, Flu A&B, Covid) Anterior Nasal Swab     Status: None   Collection Time: 06/29/22 11:00 AM   Specimen: Anterior Nasal Swab  Result Value Ref Range   SARS Coronavirus 2 by RT PCR NEGATIVE NEGATIVE   Influenza A by PCR NEGATIVE NEGATIVE   Influenza B by PCR NEGATIVE NEGATIVE    Comment: (NOTE) The Xpert Xpress SARS-CoV-2/FLU/RSV plus assay is intended as an aid in the diagnosis of influenza from Nasopharyngeal swab specimens and should not be used as a sole basis for treatment. Nasal washings and aspirates are unacceptable for Xpert Xpress SARS-CoV-2/FLU/RSV testing.  Fact Sheet for  Patients: EntrepreneurPulse.com.au  Fact Sheet for Healthcare Providers: IncredibleEmployment.be  This test is not yet approved or cleared by the [place] FDA and has been authorized for detection and/or diagnosis of SARS-CoV-2 by FDA under an Emergency Use Authorization (EUA). This EUA will remain in effect (meaning this test can be used) for the duration of the COVID-19 declaration under Section 564(b)(1) of the Act, 21 U.S.C. section 360bbb-3(b)(1), unless the authorization is terminated or revoked.     Resp Syncytial Virus by PCR NEGATIVE NEGATIVE    Comment: (NOTE) Fact Sheet  for Patients: EntrepreneurPulse.com.au  Fact Sheet for Healthcare Providers: IncredibleEmployment.be  This test is not yet approved or cleared by the [place] FDA and has been authorized for detection and/or diagnosis of SARS-CoV-2 by FDA under an Emergency Use Authorization (EUA). This EUA will remain in effect (meaning this test can be used) for the duration of the COVID-19 declaration under Section 564(b)(1) of the Act, 21 U.S.C. section 360bbb-3(b)(1), unless the authorization is terminated or revoked.  Performed at [facility], [place] [address], [place], [place] [address]   Comprehensive metabolic panel     Status: Abnormal   Collection Time: 06/29/22  1:19 PM  Result Value Ref Range   Sodium 141 135 - 145 mmol/L   Potassium 4.3 3.5 - 5.1 mmol/L   Chloride 107 98 - 111 mmol/L   CO2 15 (L) 22 - 32 mmol/L   Glucose, Bld 142 (H) 70 - 99 mg/dL    Comment: Glucose reference range applies only to samples taken after fasting for at least 8 hours.   BUN 31 (H) 8 - 23 mg/dL   Creatinine, Ser 1.62 (H) 0.44 - 1.00 mg/dL   Calcium 9.7 8.9 - 10.3 mg/dL   Total Protein 7.6 6.5 - 8.1 g/dL   Albumin 3.7 3.5 - 5.0 g/dL   AST 50 (H) 15 - 41 U/L   ALT 19 0 - 44 U/L   Alkaline Phosphatase 81 38 - 126 U/L   Total  Bilirubin 0.8 0.3 - 1.2 mg/dL   GFR, Estimated 34 (L) >60 mL/min    Comment: (NOTE) Calculated using the CKD-EPI Creatinine Equation (2021)    Anion gap 19 (H) 5 - 15    Comment: Performed at [facility], [place] [address] [place], [place] [address]  CBC with Differential     Status: Abnormal   Collection Time: 06/29/22  1:19 PM  Result Value Ref Range   WBC 4.1 4.0 - 10.5 K/uL   RBC 4.86 3.87 - 5.11 MIL/uL   Hemoglobin 13.5 12.0 - 15.0 g/dL   HCT 43.6 36.0 - 46.0 %   MCV 89.7 80.0 - 100.0 fL   MCH 27.8 26.0 - 34.0 pg   MCHC 31.0 30.0 - 36.0 g/dL   RDW 14.9 11.5 - 15.5 %   Platelets 142 (L) 150 - 400 K/uL   nRBC 0.0 0.0 - 0.2 %   Neutrophils Relative % 59 %   Neutro Abs 3.0 1.7 - 7.7 K/uL   Band Neutrophils 14 %   Lymphocytes Relative 21 %   Lymphs Abs 0.9 0.7 - 4.0 K/uL   Monocytes Relative 6 %   Monocytes Absolute 0.2 0.1 - 1.0 K/uL   Eosinophils Relative 0 %   Eosinophils Absolute 0.0 0.0 - 0.5 K/uL   Basophils Relative 0 %   Basophils Absolute 0.0 0.0 - 0.1 K/uL   WBC Morphology WHITE COUNT CONFIRMED ON SMEAR    RBC Morphology MORPHOLOGY UNREMARKABLE    Smear Review PLATELET COUNT CONFIRMED BY SMEAR    nRBC 0 0 /100 WBC   Abs Immature Granulocytes 0.00 0.00 - 0.07 K/uL    Comment: Performed at [facility], [place] [address], [place], [place] [address]  Lipase, blood     Status: None   Collection Time: 06/29/22  1:19 PM  Result Value Ref Range   Lipase 23 11 - 51 U/L    Comment: Performed at [facility] [address], [place], [place] [address]  Protime-INR     Status: Abnormal   Collection Time: 06/29/22  3:39 PM  Result Value Ref Range   Prothrombin Time 17.1 (H) 11.4 - 15.2 seconds   INR 1.4 (H) 0.8 - 1.2    Comment: (NOTE) INR goal varies based on device and disease states. Performed at [facility], [place] [address], [place], [place] [address]   APTT     Status: None   Collection Time: 06/29/22  3:39 PM  Result Value Ref Range    aPTT 31 24 - 36 seconds    Comment: Performed at [facility] [address] [place], [place] [address]  Lactic acid, plasma     Status: None   Collection Time: 06/29/22  3:40 PM  Result Value Ref Range   Lactic Acid, Venous 1.3 0.5 - 1.9 mmol/L    Comment: Performed at [facility] [address] [place], [place] [address]  Lactic acid, plasma     Status: None   Collection Time: 06/29/22  4:41 PM  Result Value Ref Range   Lactic Acid, Venous 1.6 0.5 - 1.9 mmol/L    Comment: Performed at [facility] [address], [place], [place] [address]   DG Chest Port 1 View  Result Date: 06/29/2022 CLINICAL DATA:  Questionable sepsis - evaluate for abnormality EXAM: PORTABLE CHEST - 1 VIEW COMPARISON:  11/29/2021 FINDINGS: Cardiac silhouette is unremarkable. No pneumothorax or pleural effusion. The lungs are clear. Aorta is calcified. There are thoracic degenerative changes. IMPRESSION: No acute cardiopulmonary process. Electronically Signed   By: Sammie Bench M.D.   On: 06/29/2022 15:08   CT Head Wo Contrast  Result Date: 06/29/2022 CLINICAL DATA:  Patient complains of nausea, vomiting, headache and intermittent aphasia with slurred speech. EXAM: CT HEAD WITHOUT CONTRAST TECHNIQUE: Contiguous axial images were obtained from the base of the skull through the vertex without intravenous contrast. RADIATION DOSE REDUCTION: This exam was performed according to the departmental dose-optimization program which includes automated exposure control, adjustment of the mA and/or kV according to patient size and/or use of iterative reconstruction technique. COMPARISON:  None Available. FINDINGS: Brain: No evidence of acute infarction, hemorrhage, hydrocephalus, extra-axial collection or mass lesion/mass effect. There is mild patchy low-attenuation within the subcortical and periventricular white matter compatible with chronic microvascular disease. Vascular: No hyperdense vessel or  unexpected calcification. Skull: Normal. Negative for fracture or focal lesion. Sinuses/Orbits: Right lobe appears normal. The left globe is dense with peripheral dystrophic calcifications. Paranasal sinuses and mastoid air cells are clear. Other: None IMPRESSION: 1. No acute intracranial abnormalities. 2. Mild chronic microvascular change Electronically Signed   By: Kerby Moors M.D.   On: 06/29/2022 11:18    Pending Labs Unresulted Labs (From admission, onward)     Start     Ordered   06/29/22 1806  HIV Antibody (routine testing w rflx)  (HIV Antibody (Routine testing w reflex) panel)  Once,   R       Question:  Specimen collection method  Answer:  IV Team=IV Team collect   06/29/22 1812   06/29/22 1538  Urine Culture (for pregnant, neutropenic or urologic patients or patients with an indwelling urinary catheter)  (Urine Labs)  Once,   URGENT       Question:  Indication  Answer:  Altered mental status (if no other cause identified)   06/29/22 1537   06/29/22 1436  Blood Culture (routine x 2)  (Undifferentiated presentation (screening labs and  basic nursing orders))  BLOOD CULTURE X 2,   STAT      06/29/22 1435            Vitals/Pain Today's Vitals   06/29/22 1351 06/29/22 1400 06/29/22 1500 06/29/22 1700  BP:  (!) 148/42 (!) 151/70 (!) 115/55  Pulse:  (!) 101 96 88  Resp:  (!) 24 20 (!) 21  Temp: (!) 103.5 F (39.7 C)   (!) 101.7 F (38.7 C)  TempSrc: Oral   Oral  SpO2:  94% 95% 94%  Weight:      Height:      PainSc:        Isolation Precautions No active isolations  Medications Medications  lactated ringers infusion (has no administration in time range)  ondansetron (ZOFRAN) injection 4 mg (has no administration in time range)  ceFEPIme (MAXIPIME) 2 g in sodium chloride 0.9 % 100 mL IVPB (has no administration in time range)  vancomycin variable dose per unstable renal function (pharmacist dosing) (has no administration in time range)  heparin injection 5,000 Units  (has no administration in time range)  ondansetron (ZOFRAN) injection 4 mg (4 mg Intravenous Given 06/29/22 1117)  lactated ringers bolus 1,000 mL ( Intravenous Stopped 06/29/22 1228)  acetaminophen (TYLENOL) tablet 650 mg (650 mg Oral Given 06/29/22 1427)  lactated ringers bolus 1,000 mL (1,000 mLs Intravenous New Bag/Given 06/29/22 1555)  ceFEPIme (MAXIPIME) 2 g in sodium chloride 0.9 % 100 mL IVPB (0 g Intravenous Stopped 06/29/22 1800)  metroNIDAZOLE (FLAGYL) IVPB 500 mg (0 mg Intravenous Stopped 06/29/22 1700)  vancomycin (VANCOREADY) IVPB 1500 mg/300 mL (0 mg Intravenous Stopped 06/29/22 1800)    Mobility walks     Focused Assessments    R Recommendations: See Admitting Provider Note  Report given to:   Additional Notes: Kidney transplant pt. L arm restricted. A&Ox4 at this time. Became a sepsis workup. Pt is a difficult stick for blood. She has 1 IV placed by EMS and 1 placed by IV team w/ [place].

## 2022-06-29 NOTE — ED Notes (Signed)
1st set of blood cultures collected by IV team  

## 2022-06-29 NOTE — Hospital Course (Addendum)
Denise Bush is a 72 y.o. female with pertinent PMH of ESRD s/p DDKT 01/02/2018 on chronic immunosuppression with sirolimus and belatacept infusions, T2DM, HTN, hypothyroidism, and proliferative diabetic retinopathy who presented with 2 days of nausea, vomiting, and diarrhea with intermittent confusion and fevers and is admitted for sepsis with E. coli bacteremia 2/2 UTI complicated by AKI of transplanted kidney.     Sepsis with E. coli bacteremia 2/2 complicated UTI/pyelonephritis Presented initially with 2 days of nausea, vomiting, and diarrhea with associated waxing and waning confusion and fevers.  She was found to have sepsis with pansensitive E. coli bacteremia secondary to urinary tract infection with some perinephric stranding of her transplanted kidney on CT scan.  On admission she was aggressively fluid resuscitated and started on empiric broad-spectrum antibiotics.  Over the first couple days she did have intermittent fevers and some mild confusion with continued diarrhea and decreased p.o. intake.  She gradually improved and was afebrile for over 4 days prior to discharge with improving appetite and energy levels.  She was transitioned to oral antibiotics to complete a 10-day course of antibiotics at home.  Antibiotics included vancomycin, cefepime, metronidazole which was narrowed to ceftriaxone and then to Cefadroxil 1 g twice daily.    Elevated pulmonary artery pressures Mild calcific mitral stenosis Dyspnea with intermittent supplemental oxygen requirement After aggressive IV fluid rehydration for sepsis with roughly 13 L and over the first couple days she had some hypoxemia with new oxygen requirement.  TTE showed elevated pulmonary artery pressures and mild calcific mitral stenosis.  Chest x-ray showed small bilateral pleural effusions with atelectasis of the lung bases.cardiology evaluated the patient and recommended gentle diuresis which was uptitrated to Lasix 40 mg IV twice daily.  We  also increased her carvedilol dose to 12.5 mg twice daily.  She did not require any supplemental oxygen for about 24 hours prior to discharge.  She was discharged on Lasix 20 mg daily and will follow-up with cardiology outpatient.  AKI, resolved Nontraumatic rhabdomyolysis, resolved Anion gap metabolic acidosis, resolved Proteinuria Presented with elevated creatinine of 1.62 with a peak of 1.84 secondary to poor perfusion with sepsis.  Baseline creatinine of 1.  Creatinine can continue to improve with IV fluid resuscitation and antibiotics.  Creatinine was at baseline prior to discharge with a small bump on the day of discharge after increased dose of Lasix.  No drop in urine output during admission.  Transplant renal ultrasound without abnormalities.  CK is elevated on admission with a peak of 3000 that trended down after aggressive fluids and was likely due to her being severely weak and down on the ground at home for a few hours.  She had 300+ protein on admission UA with myoglobinuria but no red blood cells under the microscope.  Recommend that she follow-up with her transplant team and her PCP with a follow-up UA.   ESRD s/p renal transplant 2019 on chronic immunosuppression Baseline creatinine of 1. S/p deceased donor kidney transplant on 01/02/2018. 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  History of BK nephropathy with consistently low viral levels while on belatacept.  She follows with [facility] for her renal transplant and was last seen on 03/01/2022, they are aware of her admission.  On chronic immunosuppression with belatacept and sirolimus (was on mycophenolate from 12/2021 to 03/2022).  Nephrology was consulted during admission for evaluation and management and did not find any signs of rejection.  She was continued on sirolimus  and did not require another belatacept infusion during admission.   Nausea, vomiting, and diarrhea with CT evidence of colitis in the  ascending colon and cecum Presented with E. coli bacteremia and sepsis secondary to UTI with concomitant severe nausea, vomiting, and diarrhea causing dehydration.  CT of the abdomen pelvis showed evidence of colitis in the ascending colon and cecum and she was treated with a 3-day course of azithromycin.  She was having liquid bowel movements prior to admission and these slowly improved to solid on the day of discharge.    Leukopenia Normocytic anemia Thrombocytopenia Patient with a baseline leukopenia ranging from 3-4 K over the past year plus.  Transient thrombocytopenia with platelet nadir of 115.  Patient with a stable normocytic anemia (hemoglobin 10-11) with iron studies showing anemia chronic disease with an elevated ferritin in the setting of acute illness.  Reticulocyte index showed hypoproliferation.  Peripheral smear without abnormal morphology of RBC, WBC, or platelets x 2 during admission.  No evidence of blood loss during admission with a large component of dilutional anemia.  Recommend follow-up with PCP and transplant team to further evaluate and manage chronic anemia and leukopenia.  Type 2 diabetes A1c here of 8.3.  She is on Lantus 23 units daily at home with sliding scale insulin.  During mission she was on a sliding scale with short acting insulin and required a maximum of 5 units daily.  She was not eating as much is usually because it was recommended to not resume her long-acting insulin unless her morning fasting glucose was above 180 at home.   Hypertension Hypertensive meds were initially held on admission due to sepsis and low blood pressures.  Cardiology recommended increasing her carvedilol dose and she was discharged on carvedilol 12.5 mg twice daily and amlodipine 2.5 mg twice daily.   Hypothyroidism Continued home levothyroxine 62.5 mcg every other day.

## 2022-06-29 NOTE — ED Notes (Signed)
Attempted to obtain labs from IV, unsuccessful. Notified phlebotomy.

## 2022-06-29 NOTE — ED Notes (Signed)
Called lab to check on status of labs, per lab they have them and are going to run them now.

## 2022-06-29 NOTE — ED Provider Notes (Signed)
[facility] Provider Note   CSN: [id number] Arrival date & time: 06/29/22  1021     History  Chief Complaint  Patient presents with   Nausea   Vomiting   Headache   stroke-like symptoms   Dysuria    Denise Bush is a 72 y.o. female.  Patient is a 72 year old female with a past medical history of ESRD status post kidney transplant 5 years ago, hypertension, diabetes, hyperlipidemia, hypothyroidism presenting to the emergency department with nausea, vomiting and diarrhea.  The patient states that her symptoms started 2 days ago.  She denies any associated abdominal pain or chest pain.  She states that she has had mild headaches.  She states that she has had intermittent confusion and slurred speech.  She denies any numbness and states that she feels generally weak all over.  She states that she had low-grade fevers with a temperature of 99 F at home.  She denies any dysuria or hematuria but states that her urine was foul-smelling this morning.  Per EMS, the patient was orthostatic positive and she received some fluids and route.  The history is provided by the patient and the EMS personnel.  Headache Dysuria      Home Medications Prior to Admission medications   Medication Sig Start Date End Date Taking? Authorizing Provider  amLODipine (NORVASC) 2.5 MG tablet Take one tablet twice a day 04/29/20   Liane Comber, NP  aspirin EC 81 MG tablet Take 1 tablet (81 mg total) by mouth 2 (two) times daily after a meal. For 2 weeks then back to once a day for DVT prevention. 01/24/22   Loni Dolly, PA-C  BELATACEPT IV Inject 1 Dose into the vein every 28 (twenty-eight) days. 07/16/18   [provider]  carvedilol (COREG) 6.25 MG tablet Take 6.25 mg by mouth 2 (two) times daily with a meal.    [provider]  Continuous Blood Gluc Receiver (FREESTYLE LIBRE 14 DAY READER) DEVI Use 1 Application as directed 02/27/18   [provider]  Continuous Blood Gluc Sensor (FREESTYLE LIBRE 14 DAY SENSOR) MISC Place 1 Device onto the skin every 14 (fourteen) days. DX-E11.22,n18.6,Z99.2 01/16/22   Alycia Rossetti, NP  ezetimibe (ZETIA) 10 MG tablet Take 1 tablet (10 mg total) by mouth daily. 02/21/17   Liane Comber, NP  gemfibrozil (LOPID) 600 MG tablet TAKE 1 TABLET TWICE A DAY WITH MEALS FOR CHOLESTEROL AND TRIGLYCERIDES 02/16/22   Alycia Rossetti, NP  glucose blood test strip 1 each. 03/23/15   [provider]  HYDROcodone-acetaminophen (NORCO/VICODIN) 5-325 MG tablet Take 1-2 tablets by mouth every 6 (six) hours as needed for moderate pain or severe pain (post op pain). 01/24/22 01/24/23  Loni Dolly, PA-C  insulin glargine (LANTUS SOLOSTAR) 100 UNIT/ML Solostar Pen INJECT 20 UNITS INTO THE SKIN DAILY FOR DIABETES Patient taking differently: Inject 23 Units into the skin daily. 10/20/20   Unk Pinto, MD  insulin lispro (HUMALOG) 100 UNIT/ML KwikPen Inject 3-5 Units into the skin 3 (three) times daily. 03/30/20   [provider]  Insulin Pen Needle (FIFTY50 PEN NEEDLES) 31G X 5 MM MISC Test sugars Three to four times daily 08/06/18   Garnet Sierras, NP  ketoconazole (NIZORAL) 2 % cream Apply 1 Application topically daily as needed for irritation (redness and scaliness).    [provider]  Lancets Misc. (UNISTIK 2 NORMAL) MISC Use 1 each 4 (four) times daily Product selection permitted  according to insurance preference. E11.9 Type 2 diabetes mellitus 08/02/18   Liane Comber, NP  levothyroxine (SYNTHROID) 125 MCG tablet Take 62.5 mcg by mouth every other day.    [provider]  linagliptin (TRADJENTA) 5 MG TABS tablet Take 5 mg by mouth daily. 10/14/18   [provider]  mycophenolate (CELLCEPT) 250 MG capsule Take 1,000 mg by mouth every 12 (twelve) hours. 11/18/21   [provider]  sirolimus (RAPAMUNE) 1 MG tablet Take    1 tablet     Daily     for Kidney  Transplant Patient taking differently: Take 2 mg by mouth daily. 02/10/20   Unk Pinto, MD  tiZANidine (ZANAFLEX) 2 MG tablet Take 1-2 tablets (2-4 mg total) by mouth every 6 (six) hours as needed for muscle spasms. 01/24/22 01/24/23  Loni Dolly, PA-C      Allergies    Hydralazine, Azor [amlodipine-olmesartan], Camellia, Denaverine, Diphenhydramine hcl, Hydrochlorothiazide, Hydrochlorothiazide w-triamterene, Labetalol, Laureth, Lisinopril, Metformin, Metoclopramide, Other, Prednisone, Minoxidil, Spironolactone, and Statins    Review of Systems   Review of Systems  Genitourinary:  Positive for dysuria.  Neurological:  Positive for headaches.    Physical Exam Updated Vital Signs BP (!) 151/70   Pulse 96   Temp (!) 103.5 F (39.7 C) (Oral)   Resp 20   Ht '5\' 5"'$  (1.651 m)   Wt 72.1 kg   LMP  (LMP Unknown)   SpO2 95%   BMI 26.45 kg/m  Physical Exam Vitals and nursing note reviewed.  Constitutional:      General: She is not in acute distress.    Appearance: She is well-developed.  HENT:     Head: Normocephalic and atraumatic.     Mouth/Throat:     Mouth: Mucous membranes are moist.     Pharynx: Oropharynx is clear.  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     Extraocular Movements: Extraocular movements intact.  Cardiovascular:     Rate and Rhythm: Normal rate and regular rhythm.  Pulmonary:     Effort: Pulmonary effort is normal.     Breath sounds: Normal breath sounds.  Abdominal:     Palpations: Abdomen is soft.     Tenderness: There is no abdominal tenderness.     Comments: No CVA tenderness bilaterally  Musculoskeletal:        General: Normal range of motion.     Cervical back: Normal range of motion and neck supple.  Skin:    General: Skin is warm and dry.  Neurological:     Mental Status: She is alert and oriented to person, place, and time.     GCS: GCS eye subscore is 4. GCS verbal subscore is 5. GCS motor subscore is 6.     Cranial  Nerves: No cranial nerve deficit, dysarthria or facial asymmetry.     Sensory: No sensory deficit.     Motor: No weakness.  Psychiatric:        Mood and Affect: Mood normal.        Behavior: Behavior normal.     ED Results / Procedures / Treatments   Labs (all labs ordered are listed, but only abnormal results are displayed) Labs Reviewed  COMPREHENSIVE METABOLIC PANEL - Abnormal; Notable for the following components:      Result Value   CO2 15 (*)    Glucose, Bld 142 (*)    BUN 31 (*)    Creatinine, Ser 1.62 (*)  AST 50 (*)    GFR, Estimated 34 (*)    Anion gap 19 (*)    All other components within normal limits  CBC WITH DIFFERENTIAL/PLATELET - Abnormal; Notable for the following components:   Platelets 142 (*)    All other components within normal limits  URINALYSIS, ROUTINE W REFLEX MICROSCOPIC - Abnormal; Notable for the following components:   Hgb urine dipstick LARGE (*)    Bilirubin Urine SMALL (*)    Protein, ur >300 (*)    Leukocytes,Ua SMALL (*)    All other components within normal limits  URINALYSIS, MICROSCOPIC (REFLEX) - Abnormal; Notable for the following components:   Bacteria, UA MANY (*)    All other components within normal limits  RESP PANEL BY RT-PCR (RSV, FLU A&B, COVID)  RVPGX2  CULTURE, BLOOD (ROUTINE X 2)  CULTURE, BLOOD (ROUTINE X 2)  URINE CULTURE  LIPASE, BLOOD  LACTIC ACID, PLASMA  LACTIC ACID, PLASMA  PROTIME-INR  APTT    EKG EKG Interpretation  Date/Time:  Thursday June 29 2022 11:21:17 EST Ventricular Rate:  78 PR Interval:  165 QRS Duration: 108 QT Interval:  379 QTC Calculation: 432 R Axis:   27 Text Interpretation: Sinus rhythm Probable left atrial enlargement RSR' in V1 or V2, right VCD or RVH ST elevation, consider inferior injury No significant change since last tracing Confirmed by Leanord Asal ([id number]) on 06/29/2022 11:51:34 AM  Radiology DG Chest Port 1 View  Result Date: 06/29/2022 CLINICAL DATA:   Questionable sepsis - evaluate for abnormality EXAM: PORTABLE CHEST - 1 VIEW COMPARISON:  11/29/2021 FINDINGS: Cardiac silhouette is unremarkable. No pneumothorax or pleural effusion. The lungs are clear. Aorta is calcified. There are thoracic degenerative changes. IMPRESSION: No acute cardiopulmonary process. Electronically Signed   By: Sammie Bench M.D.   On: 06/29/2022 15:08   CT Head Wo Contrast  Result Date: 06/29/2022 CLINICAL DATA:  Patient complains of nausea, vomiting, headache and intermittent aphasia with slurred speech. EXAM: CT HEAD WITHOUT CONTRAST TECHNIQUE: Contiguous axial images were obtained from the base of the skull through the vertex without intravenous contrast. RADIATION DOSE REDUCTION: This exam was performed according to the departmental dose-optimization program which includes automated exposure control, adjustment of the mA and/or kV according to patient size and/or use of iterative reconstruction technique. COMPARISON:  None Available. FINDINGS: Brain: No evidence of acute infarction, hemorrhage, hydrocephalus, extra-axial collection or mass lesion/mass effect. There is mild patchy low-attenuation within the subcortical and periventricular white matter compatible with chronic microvascular disease. Vascular: No hyperdense vessel or unexpected calcification. Skull: Normal. Negative for fracture or focal lesion. Sinuses/Orbits: Right lobe appears normal. The left globe is dense with peripheral dystrophic calcifications. Paranasal sinuses and mastoid air cells are clear. Other: None IMPRESSION: 1. No acute intracranial abnormalities. 2. Mild chronic microvascular change Electronically Signed   By: Kerby Moors M.D.   On: 06/29/2022 11:18    Procedures .Critical Care  Performed by: Kemper Durie, DO Authorized by: Kemper Durie, DO   Critical care provider statement:    Critical care time (minutes):  40   Critical care was necessary to treat or prevent  imminent or life-threatening deterioration of the following conditions:  Sepsis     Medications Ordered in ED Medications  lactated ringers bolus 1,000 mL (1,000 mLs Intravenous New Bag/Given 06/29/22 1555)  lactated ringers infusion (has no administration in time range)  ceFEPIme (MAXIPIME) 2 g in sodium chloride 0.9 % 100 mL IVPB (2 g Intravenous New Bag/Given  06/29/22 1542)  metroNIDAZOLE (FLAGYL) IVPB 500 mg (has no administration in time range)  vancomycin (VANCOREADY) IVPB 1500 mg/300 mL (1,500 mg Intravenous New Bag/Given 06/29/22 1547)  ondansetron (ZOFRAN) injection 4 mg (4 mg Intravenous Given 06/29/22 1117)  lactated ringers bolus 1,000 mL ( Intravenous Stopped 06/29/22 1228)  acetaminophen (TYLENOL) tablet 650 mg (650 mg Oral Given 06/29/22 1427)    ED Course/ Medical Decision Making/ A&P Clinical Course as of 06/29/22 1557  Thu Jun 29, 2022  1555 UA positive for UTI and did spike a fever. Started on sepsis management in the setting of immunocompromised state and will be given broad spectrum antibiotics. Due to intermittent confusion she will be admitted for her UTI and mild AKI. [VK]    Clinical Course User Index [VK] Kemper Durie, DO                             Medical Decision Making This patient presents to the ED with chief complaint(s) of N/V/D with pertinent past medical history of ESRD s/p kidney transplant, HTN, DM, HLD which further complicates the presenting complaint. The complaint involves an extensive differential diagnosis and also carries with it a high risk of complications and morbidity.    The differential diagnosis includes gastroenteritis, dehydration, electrolyte abnormality, rejection, renal failure, atypical ACS, TIA, patient has no current neurologic deficits making CVA less likely, UTI, infection, no signs of sepsis on exam  Additional history obtained: Additional history obtained from EMS  Records reviewed Care Everywhere/External Records -  outpatient transplant records  ED Course and Reassessment: On patient's arrival to the emergency department she is awake alert well-appearing in no acute distress without focal neurologic deficits and is hemodynamically stable.  She will have labs performed labs performed to evaluate for dehydration or electrolyte abnormality and cause of her symptoms as well as head CT with her recent intermittent slurred speech.  She will be given IV fluids and Zofran for symptomatic management and will be closely reassessed.  Independent labs interpretation:  The following labs were independently interpreted: Cr 1.6 from baseline 0.6, UA positive for UTI  Independent visualization of imaging: - I independently visualized the following imaging with scope of interpretation limited to determining acute life threatening conditions related to emergency care: CTH, CXR, which revealed no acute disease  Consultation: - Consulted or discussed management/test interpretation w/ external professional: hospitalist  Consideration for admission or further workup: patient requires admission for further management of her UTI with confusion Social Determinants of health: N/A    Amount and/or Complexity of Data Reviewed Labs: ordered. Radiology: ordered.  Risk OTC drugs. Prescription drug management. Decision regarding hospitalization.          Final Clinical Impression(s) / ED Diagnoses Final diagnoses:  Acute cystitis with hematuria  AKI (acute kidney injury) ([place])  Confusion    Rx / DC Orders ED Discharge Orders     None         Kemper Durie, DO 06/29/22 1557

## 2022-06-29 NOTE — Progress Notes (Signed)
Elink following sepsis bundle. °

## 2022-06-29 NOTE — ED Notes (Signed)
DO aware of IV and lab difficulties. IV team consulted.

## 2022-06-29 NOTE — ED Notes (Signed)
Pt confused and restless in bed. Pt febrile. Notified EDP

## 2022-06-29 NOTE — H&P (Addendum)
Date: 06/29/2022               Patient Name:  Denise Bush MRN: [id number]  DOB: 1951/01/24 Age / Sex: 72 y.o., female   PCP: Willene Hatchet, NP         Medical Service: Internal Medicine Teaching Service         Attending Physician: Dr. Charise Killian, MD      First Contact: Dr. Johny Blamer, DO Pager [phone]    Second Contact: Dr. Delene Ruffini, MD Pager [phone]         After Hours (After 5p/  First Contact Pager: [phone]  weekends / holidays): Second Contact Pager: [phone]   SUBJECTIVE   Chief Complaint: Altered mental status  History of Present Illness:  Denise Bush is a 72 y/o female with PMH of ESRD s/p DDKT 01/02/2018 on chronic immunosuppression with MMF/sirolimus and belatacept infusions, T2DM, HTN, hypothyroidism, and proliferative diabetic retinopathy who presents with 2 days of nausea, vomiting, and diarrhea with intermittent confusion and fevers.  The patient is not able to recall everything that happened but her husband provides a good account of her history of the last couple days.    He states on Monday and Tuesday she was her normal self.  However Wednesday morning the patient woke up confused and did not know when it was.  She did not have a fever at that time.  He said he found her on the floor a few hours later next of the bed.  She said that she slid out of the bed and sat down and could not get up.  She then began having nausea, vomiting, and diarrhea.  She was unable to get up for about 2.5 hours and they called EMS.  She was found to have a fever of 100.1 but did not want to go to the hospital.  She stayed home and tried to drink enough fluids.  Her fever continued to go up to a max of 101 and her blood pressure was as low as 110/50.  She was unable to eat anything.  They called her PCP in the morning with this information who directed her to go to the hospital.  In total she had about 6-8 episodes of vomiting and 5-6 episodes of diarrhea that were both  yellow and without blood.  She has not eaten since Tuesday but has been able to keep fluids down for the most part.  During our visit he states that the patient is not back to her normal self but is better than when she got here.  She did have an episode of dysuria yesterday but otherwise does not recall any urinary symptoms other than that. She denies any abdominal pain, chest pain, shortness of breath, suprapubic pain, focal weakness, syncope, cough, or headaches.  She is not currently nauseous but has vomited in the ED.  No recent med changes aside from switching back to sirolimus about 2 months ago after being on mycophenolate s/p right knee replacement in 12/2021.  ED Course: Presented as above and was transiently confused with a fever of 103.5 and BP of 93/67 with UA showing many bacteria and WBCs with clumps as well as blood on the dip with no RBCs and >300 protein. Also had an AKI with Cr of 1.62 with GFR of 34 from a baseline Cr of 0.9. She was given fluids, tylenol, and antibiotics for her fever and bacteruria with improvement in her mental status and resolution of her  fever. IMTS was paged for admission.  Past Medical History Past Medical History:  Diagnosis Date   Allergy    Arthritis    Blood transfusion without reported diagnosis    Cataract    bilateral   Diabetic retinopathy ([place])    End stage renal disease on dialysis due to type 2 diabetes mellitus ([place]) 03/22/2015   ESRD (end stage renal disease) ([place])    Hashimoto's disease    Hyperlipidemia    Hyperparathyroidism, secondary renal ([place]) 06/25/2015   Hypertension    Hypothyroid    Loss of vision    both eyes, no vision left eye and little in left      Meds:  Amlodipine 5 mg daily Aspirin 81 mg daily Belatacept every 4 weeks (06/23/2022) Carvedilol 6.25 mg twice daily Ezetimibe 10 mg daily Gemfibrozil 600 mg twice daily Linagliptin 5 mg daily Lantus 23 units daily Humalog 3 times daily with meals as needed (4  units) Levothyroxine 62.5 mcg every other day Sirolimus 2 mg daily  Past Surgical History Renal artery transplant 2019 Past Surgical History:  Procedure Laterality Date   ABDOMINAL HYSTERECTOMY     BREAST BIOPSY Right 2015   benign   COLONOSCOPY     EYE SURGERY     KNEE SURGERY     TOTAL KNEE ARTHROPLASTY Right 01/24/2022   Procedure: RIGHT TOTAL KNEE ARTHROPLASTY;  Surgeon: Melrose Nakayama, MD;  Location: [facility];  Service: Orthopedics;  Laterality: Right;    Social:  Lives at home with husband and 41 year old father Support: Family Level of Function: Independent in ADLs and IADLs PCP: Willene Hatchet, NP Substances: Denies past or current use of tobacco, alcohol, or drugs.  Family History:  Family History  Problem Relation Age of Onset   Breast cancer Sister    CAD Other        No family history   Colon polyps Maternal Aunt    Breast cancer Maternal Aunt    Colon polyps Maternal Uncle    Breast cancer Maternal Grandmother    Breast cancer Maternal Aunt    Colon cancer Neg Hx      Allergies: Allergies as of 06/29/2022 - Review Complete 06/29/2022  Allergen Reaction Noted   Hydralazine Shortness Of Breath 06/24/2014   Azor [amlodipine-olmesartan] Other (See Comments) 02/18/2013   Benadryl [diphenhydramine] Other (See Comments) 07/09/2020   Camellia Other (See Comments) 07/09/2020   Denaverine Other (See Comments) 01/31/2019   Dyazide [hydrochlorothiazide w-triamterene] Swelling and Other (See Comments)    Glucophage [metformin] Diarrhea, Itching, and Nausea Only    Hydrochlorothiazide Other (See Comments) 07/09/2020   Laureth Other (See Comments) 01/31/2019   Prednisone Other (See Comments) 01/31/2019   Reglan [metoclopramide] Other (See Comments) 04/07/2015   Trandate [labetalol] Other (See Comments) 06/24/2014   Zestril [lisinopril] Other (See Comments) 08/26/2013   Aldactone [spironolactone] Rash and Other (See Comments) 08/26/2013   Rogaine [minoxidil]  Rash and Other (See Comments) 06/24/2014   Statins Rash 06/24/2014    Review of Systems: A complete ROS was negative except as per HPI.   OBJECTIVE:   Physical Exam: Blood pressure (!) 151/70, pulse 96, temperature (!) 103.5 F (39.7 C), temperature source Oral, resp. rate 20, height '5\' 5"'$  (1.651 m), weight 72.1 kg, SpO2 95 %.  Constitutional: Slightly uncomfortable elderly female laying in bed. In no acute distress. HENT: Normocephalic, atraumatic, moist mucous membranes Eyes: Sclera non-icteric, no subconjunctival pallor Neck: normal atraumatic, no jvd Cardio:Regular rate and rhythm. No murmurs, rubs, or gallops.  2+ bilateral radial and dorsalis pedis  pulses.  Palpable thrill over LUE AVF. Pulm:Clear to auscultation bilaterally. Normal work of breathing on room air. Abdomen: Soft, non-tender, non-distended, positive bowel sounds. [id number] for extremity edema. Skin:Warm and dry.  Slightly poor skin turgor. Neuro:Alert and oriented x3. No focal deficit noted. Psych:Pleasant mood and affect.  Labs: CBC    Component Value Date/Time   WBC 4.1 06/29/2022 1319   RBC 4.86 06/29/2022 1319   HGB 13.5 06/29/2022 1319   HCT 43.6 06/29/2022 1319   PLT 142 (L) 06/29/2022 1319   MCV 89.7 06/29/2022 1319   MCH 27.8 06/29/2022 1319   MCHC 31.0 06/29/2022 1319   RDW 14.9 06/29/2022 1319   LYMPHSABS 0.9 06/29/2022 1319   MONOABS 0.2 06/29/2022 1319   EOSABS 0.0 06/29/2022 1319   BASOSABS 0.0 06/29/2022 1319     CMP     Component Value Date/Time   NA 141 06/29/2022 1319   K 4.3 06/29/2022 1319   CL 107 06/29/2022 1319   CO2 15 (L) 06/29/2022 1319   GLUCOSE 142 (H) 06/29/2022 1319   BUN 31 (H) 06/29/2022 1319   CREATININE 1.62 (H) 06/29/2022 1319   CREATININE 0.93 01/20/2021 1112   CALCIUM 9.7 06/29/2022 1319   PROT 7.6 06/29/2022 1319   ALBUMIN 3.7 06/29/2022 1319   AST 50 (H) 06/29/2022 1319   ALT 19 06/29/2022 1319   ALKPHOS 81 06/29/2022 1319   BILITOT 0.8 06/29/2022  1319   GFRNONAA 34 (L) 06/29/2022 1319   GFRNONAA 58 (L) 10/06/2020 1226   GFRAA 67 10/06/2020 1226    Imaging: DG Chest Port 1 View  Result Date: 06/29/2022 CLINICAL DATA:  Questionable sepsis - evaluate for abnormality EXAM: PORTABLE CHEST - 1 VIEW COMPARISON:  11/29/2021 FINDINGS: Cardiac silhouette is unremarkable. No pneumothorax or pleural effusion. The lungs are clear. Aorta is calcified. There are thoracic degenerative changes. IMPRESSION: No acute cardiopulmonary process. Electronically Signed   By: Sammie Bench M.D.   On: 06/29/2022 15:08   CT Head Wo Contrast  Result Date: 06/29/2022 CLINICAL DATA:  Patient complains of nausea, vomiting, headache and intermittent aphasia with slurred speech. EXAM: CT HEAD WITHOUT CONTRAST TECHNIQUE: Contiguous axial images were obtained from the base of the skull through the vertex without intravenous contrast. RADIATION DOSE REDUCTION: This exam was performed according to the departmental dose-optimization program which includes automated exposure control, adjustment of the mA and/or kV according to patient size and/or use of iterative reconstruction technique. COMPARISON:  None Available. FINDINGS: Brain: No evidence of acute infarction, hemorrhage, hydrocephalus, extra-axial collection or mass lesion/mass effect. There is mild patchy low-attenuation within the subcortical and periventricular white matter compatible with chronic microvascular disease. Vascular: No hyperdense vessel or unexpected calcification. Skull: Normal. Negative for fracture or focal lesion. Sinuses/Orbits: Right lobe appears normal. The left globe is dense with peripheral dystrophic calcifications. Paranasal sinuses and mastoid air cells are clear. Other: None IMPRESSION: 1. No acute intracranial abnormalities. 2. Mild chronic microvascular change Electronically Signed   By: Kerby Moors M.D.   On: 06/29/2022 11:18     EKG: personally reviewed my interpretation is sinus  rhythm with RSR' in V1/V2. Prior EKG 11/2021 consistent.  ASSESSMENT & PLAN:   Assessment & Plan by Problem: Principal Problem:   Sepsis ([place]) Active Problems:   Hypothyroidism   Essential hypertension   Type 2 diabetes mellitus treated with insulin ([place])   Kidney replaced by transplant   Urinary tract infection   AKI (acute kidney injury) (  HCC)   Nausea, vomiting, and diarrhea   High anion gap metabolic acidosis   SABEL BRAUND is a 72 y.o. female with pertinent PMH of ESRD s/p DDKT 01/02/2018 on chronic immunosuppression with MMF/sirolimus and belatacept infusions, T2DM, HTN, hypothyroidism, and proliferative diabetic retinopathy who presents with 2 days of nausea, vomiting, and diarrhea with intermittent confusion and fevers and is admitted for sepsis with urinary tract infection and AKI.  Sepsis Acute encephalopathy, intermittent UTI Patient presented hypotensive, tachycardic, febrile, and altered with recent dysuria and UA consistent with urinary tract infection as well as an AKI complicated by renal transplant.  She has been intermittently febrile with acute worsening in her encephalopathy at those times but has overall steadily improved since getting fluids and antibiotics.  We suspect that the combination of her UTI and likely viral gastroenteritis have caused severe volume depletion leading to her altered state and AKI.  Low suspicion for meningitis or encephalitis.  Will continue treating her UTI and volume depletion while further lab testing is done including urine culture and blood cultures. - Continue vancomycin, cefepime, metronidazole - Continue to monitor fever curve, Tylenol as needed for fever - Continue LR at 150 mL/h - Monitor blood and urine cultures - Morning CBC and renal function panel  Nausea, vomiting, and diarrhea Seems to be a viral gastroenteritis that has caused volume depletion.  We will get a GI pathogen panel and C. difficile testing and continue to treat  with IV fluids and antibiotics as above. - GI pathogen panel and C. difficile - IV fluids and ondansetron 4 mg every 4 hours as needed - Encourage p.o. intake as tolerated  AKI ESRD s/p renal transplant 2019 on chronic immunosuppression Baseline creatinine of 1 and admission creatinine of 1.6 likely prerenal secondary to GI losses and poor p.o. intake over the last few days.  S/p deceased donor kidney transplant on 01/02/2018.  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  History of BK nephropathy with consistently low viral levels while on belatacept.  She follows with Dr. Azzie Glatter and Dr. Pierce Crane ([facility]) for her renal transplant and was last seen on 03/01/2022.  On chronic immunosuppression with belatacept and sirolimus (was on mycophenolate from 12/2021 to 03/2022).  We will rehydrate her with IV fluids and monitor renal function.  Case discussed with nephrology who agree with plan to treat infection and rehydrate.  They will follow the patient as well peripherally. - Resume sirolimus 2 mg daily  UA abnormalities UA shows small amount of bilirubin, large amount of hemoglobin on dipstick, >300 protein without RBCs.  She was down for at least 2 and half hours yesterday.  With concern for myoglobinuria we will continue with IV fluids and check a CK.  Anion gap metabolic acidosis Presents with a bicarb of 15 and anion gap of 19.  Lactic acid of 1.3-1.6.  Likely secondary to GI loss of bicarb.  Will hydrate patient and make sure that she is eating.  We will recheck renal function panel tonight.  Thrombocytopenia Platelets of 142 without any history of thrombocytopenia.  Most recent she has been stable at [place].  Platelet morphology normal.  Type 2 diabetes Last A1c was 7.3.  Med rec sure that she is not consistently taking insulin so we will monitor sugar with sliding scale and plan to add back basal insulin after clarifying with the patient or based on  sugars. - sensitive SSI - A1c  Hypertension We will  hold her home medications of amlodipine and carvedilol in the setting of low blood pressures.  Hypothyroidism Resume home levothyroxine 62.5 mcg every other day.  Diet: Carb-Modified VTE: Heparin IVF: LR, 150 mL/hr Code: DNR  Dispo: Admit patient to Observation with expected length of stay less than 2 midnights.  Signed: Johny Blamer, DO Internal Medicine Resident PGY-1  06/29/2022, 7:21 PM   Dr. Johny Blamer, DO Pager [phone]

## 2022-06-29 NOTE — ED Notes (Signed)
D/t high acuity assignment and waiting on IV team, labs and abx not started in timely manner.

## 2022-06-29 NOTE — Progress Notes (Addendum)
Pharmacy Antibiotic Note  Denise Bush is a 72 y.o. female admitted on 06/29/2022 with sepsis, concern for UTI.  Pharmacy has been consulted for vancomycin and cefepime dosing. Patient is ESRD sp DDKT 01/02/2018, s/p alemtuzumab induction on belatacept/sirolimus. Scr 1.62 elevated from baseline of ~0.9 Tmax 103.5.    Plan: Vancomycin 1500 mg IV x 1, then variable dosing per random levels given unstable renal function Cefepime 2g IV q12h Monitor C&S, renal function, s/sx improvement, LOT and narrow as able  Height: '5\' 5"'$  (165.1 cm) Weight: 72.1 kg (158 lb 15.2 oz) IBW/kg (Calculated) : 57  Temp (24hrs), Avg:101.1 F (38.4 C), Min:98.6 F (37 C), Max:103.5 F (39.7 C)  Recent Labs  Lab 06/23/22 1010 06/29/22 1319  WBC 2.7* 4.1  CREATININE 0.91 1.62*    Estimated Creatinine Clearance: 32.1 mL/min (A) (by C-G formula based on SCr of 1.62 mg/dL (H)).    Allergies  Allergen Reactions   Hydralazine Shortness Of Breath    Chest pain fatigue   Azor [Amlodipine-Olmesartan] Other (See Comments)    Sharp chest pain     Camellia Other (See Comments)    Unknown   Denaverine Other (See Comments)    Unknown   Diphenhydramine Hcl Other (See Comments)    Unknown   Hydrochlorothiazide Other (See Comments)    "contractions in throat" Diazide pt states cause throat to swell.   Hydrochlorothiazide W-Triamterene Other (See Comments)    Chest pain   Labetalol     Per patient, caused foot pain   Laureth Other (See Comments)    Unknown   Lisinopril Other (See Comments)    Severe radiating foot pain.   Metformin Itching    Loss of bowel control, general sick feeling   Metoclopramide Other (See Comments)    Make the acid reflux worse   Other Swelling    "contractions in throat" Diazide pt states cause throat to swell.   Prednisone Other (See Comments)    Elevated Glucose    Minoxidil Rash    Choking, neck contractions fatigue   Spironolactone Rash and Other (See Comments)     Severe radiating pain in feet    Statins Rash    Fatigue    Antimicrobials this admission: vancomycin 2/29 >>  cefepime 2/29 >>  metronidazole 2/29 x 1  Microbiology results: 2/29 BCx: sent 2/29 UCx: sent  2/29 RVP: sent  Thank you for allowing pharmacy to be a part of this patient's care.  Eliseo Gum, PharmD PGY1 Pharmacy Resident   06/29/2022  3:53 PM

## 2022-06-29 NOTE — ED Triage Notes (Signed)
Pt arrived to ED via EMS from home w/ multiple complaints: N/V, posterior headache, intermittent aphasia and slurred speech x 2 days, burning w/ urination x 1 day.  Stroke-like symptoms: LKW was 2100 on 2/27. When pt woke up on the 28th she was confused, aphasic, slurred speech and posterior headache.   EMS reports pt orthostatic w/ VS 140/60 while sitting and 98/50 while standing. EMS gave 522m NS bolus. BP was then 122/56. '4mg'$  zofran also given.   L arm restricted. Pt is blind and hx of kidney transplant.

## 2022-06-30 ENCOUNTER — Inpatient Hospital Stay (HOSPITAL_COMMUNITY): Payer: Medicare PPO

## 2022-06-30 DIAGNOSIS — A419 Sepsis, unspecified organism: Secondary | ICD-10-CM

## 2022-06-30 DIAGNOSIS — R6520 Severe sepsis without septic shock: Secondary | ICD-10-CM | POA: Diagnosis not present

## 2022-06-30 DIAGNOSIS — N3001 Acute cystitis with hematuria: Secondary | ICD-10-CM | POA: Diagnosis present

## 2022-06-30 DIAGNOSIS — Z794 Long term (current) use of insulin: Secondary | ICD-10-CM

## 2022-06-30 DIAGNOSIS — G9341 Metabolic encephalopathy: Secondary | ICD-10-CM | POA: Diagnosis present

## 2022-06-30 DIAGNOSIS — I272 Pulmonary hypertension, unspecified: Secondary | ICD-10-CM | POA: Diagnosis present

## 2022-06-30 DIAGNOSIS — I472 Ventricular tachycardia, unspecified: Secondary | ICD-10-CM | POA: Diagnosis not present

## 2022-06-30 DIAGNOSIS — Z66 Do not resuscitate: Secondary | ICD-10-CM | POA: Diagnosis present

## 2022-06-30 DIAGNOSIS — E872 Acidosis, unspecified: Secondary | ICD-10-CM | POA: Diagnosis present

## 2022-06-30 DIAGNOSIS — E113599 Type 2 diabetes mellitus with proliferative diabetic retinopathy without macular edema, unspecified eye: Secondary | ICD-10-CM | POA: Diagnosis present

## 2022-06-30 DIAGNOSIS — R4701 Aphasia: Secondary | ICD-10-CM | POA: Diagnosis present

## 2022-06-30 DIAGNOSIS — B9620 Unspecified Escherichia coli [E. coli] as the cause of diseases classified elsewhere: Secondary | ICD-10-CM | POA: Diagnosis not present

## 2022-06-30 DIAGNOSIS — R011 Cardiac murmur, unspecified: Secondary | ICD-10-CM | POA: Diagnosis not present

## 2022-06-30 DIAGNOSIS — R933 Abnormal findings on diagnostic imaging of other parts of digestive tract: Secondary | ICD-10-CM | POA: Diagnosis not present

## 2022-06-30 DIAGNOSIS — G9340 Encephalopathy, unspecified: Secondary | ICD-10-CM

## 2022-06-30 DIAGNOSIS — R410 Disorientation, unspecified: Secondary | ICD-10-CM | POA: Diagnosis present

## 2022-06-30 DIAGNOSIS — D649 Anemia, unspecified: Secondary | ICD-10-CM | POA: Diagnosis present

## 2022-06-30 DIAGNOSIS — M6282 Rhabdomyolysis: Secondary | ICD-10-CM | POA: Diagnosis present

## 2022-06-30 DIAGNOSIS — K529 Noninfective gastroenteritis and colitis, unspecified: Secondary | ICD-10-CM | POA: Diagnosis not present

## 2022-06-30 DIAGNOSIS — J9811 Atelectasis: Secondary | ICD-10-CM | POA: Diagnosis present

## 2022-06-30 DIAGNOSIS — R7881 Bacteremia: Secondary | ICD-10-CM | POA: Diagnosis not present

## 2022-06-30 DIAGNOSIS — E039 Hypothyroidism, unspecified: Secondary | ICD-10-CM | POA: Diagnosis present

## 2022-06-30 DIAGNOSIS — R821 Myoglobinuria: Secondary | ICD-10-CM | POA: Diagnosis present

## 2022-06-30 DIAGNOSIS — Y830 Surgical operation with transplant of whole organ as the cause of abnormal reaction of the patient, or of later complication, without mention of misadventure at the time of the procedure: Secondary | ICD-10-CM | POA: Diagnosis present

## 2022-06-30 DIAGNOSIS — Z1152 Encounter for screening for COVID-19: Secondary | ICD-10-CM | POA: Diagnosis not present

## 2022-06-30 DIAGNOSIS — N179 Acute kidney failure, unspecified: Secondary | ICD-10-CM | POA: Diagnosis present

## 2022-06-30 DIAGNOSIS — E119 Type 2 diabetes mellitus without complications: Secondary | ICD-10-CM | POA: Diagnosis not present

## 2022-06-30 DIAGNOSIS — I700 Atherosclerosis of aorta: Secondary | ICD-10-CM | POA: Diagnosis present

## 2022-06-30 DIAGNOSIS — D696 Thrombocytopenia, unspecified: Secondary | ICD-10-CM | POA: Diagnosis present

## 2022-06-30 DIAGNOSIS — N390 Urinary tract infection, site not specified: Secondary | ICD-10-CM | POA: Diagnosis not present

## 2022-06-30 DIAGNOSIS — I050 Rheumatic mitral stenosis: Secondary | ICD-10-CM | POA: Diagnosis present

## 2022-06-30 DIAGNOSIS — R197 Diarrhea, unspecified: Secondary | ICD-10-CM | POA: Diagnosis not present

## 2022-06-30 DIAGNOSIS — I10 Essential (primary) hypertension: Secondary | ICD-10-CM | POA: Diagnosis present

## 2022-06-30 DIAGNOSIS — D72819 Decreased white blood cell count, unspecified: Secondary | ICD-10-CM | POA: Diagnosis present

## 2022-06-30 DIAGNOSIS — D84821 Immunodeficiency due to drugs: Secondary | ICD-10-CM | POA: Diagnosis present

## 2022-06-30 DIAGNOSIS — N186 End stage renal disease: Secondary | ICD-10-CM | POA: Diagnosis not present

## 2022-06-30 DIAGNOSIS — Z940 Kidney transplant status: Secondary | ICD-10-CM

## 2022-06-30 DIAGNOSIS — N12 Tubulo-interstitial nephritis, not specified as acute or chronic: Secondary | ICD-10-CM | POA: Diagnosis present

## 2022-06-30 DIAGNOSIS — T8619 Other complication of kidney transplant: Secondary | ICD-10-CM | POA: Diagnosis present

## 2022-06-30 DIAGNOSIS — A4151 Sepsis due to Escherichia coli [E. coli]: Secondary | ICD-10-CM | POA: Diagnosis present

## 2022-06-30 LAB — GASTROINTESTINAL PANEL BY PCR, STOOL (REPLACES STOOL CULTURE)

## 2022-06-30 LAB — GLUCOSE, CAPILLARY
Glucose-Capillary: 135 mg/dL — ABNORMAL HIGH (ref 70–99)
Glucose-Capillary: 210 mg/dL — ABNORMAL HIGH (ref 70–99)
Glucose-Capillary: 152 mg/dL — ABNORMAL HIGH (ref 70–99)
Glucose-Capillary: 129 mg/dL — ABNORMAL HIGH (ref 70–99)

## 2022-06-30 LAB — CBC WITH DIFFERENTIAL/PLATELET
Abs Immature Granulocytes: 0 10*3/uL (ref 0.00–0.07)
Basophils Absolute: 0 10*3/uL (ref 0.0–0.1)
Basophils Relative: 0 %
Eosinophils Absolute: 0 10*3/uL (ref 0.0–0.5)
Eosinophils Relative: 0 %
HCT: 36.8 % (ref 36.0–46.0)
Hemoglobin: 11.3 g/dL — ABNORMAL LOW (ref 12.0–15.0)
Lymphocytes Relative: 21 %
Lymphs Abs: 0.8 10*3/uL (ref 0.7–4.0)
MCH: 27.8 pg (ref 26.0–34.0)
MCHC: 30.7 g/dL (ref 30.0–36.0)
MCV: 90.4 fL (ref 80.0–100.0)
Monocytes Absolute: 0.3 10*3/uL (ref 0.1–1.0)
Monocytes Relative: 7 %
Neutro Abs: 2.9 10*3/uL (ref 1.7–7.7)
Neutrophils Relative %: 72 %
Platelets: 121 10*3/uL — ABNORMAL LOW (ref 150–400)
RBC: 4.07 MIL/uL (ref 3.87–5.11)
RDW: 15 % (ref 11.5–15.5)
WBC: 4 10*3/uL (ref 4.0–10.5)
nRBC: 0 % (ref 0.0–0.2)
nRBC: 0 /100 WBC

## 2022-06-30 LAB — RENAL FUNCTION PANEL
Albumin: 2.9 g/dL — ABNORMAL LOW (ref 3.5–5.0)
Anion gap: 15 (ref 5–15)
BUN: 34 mg/dL — ABNORMAL HIGH (ref 8–23)
CO2: 15 mmol/L — ABNORMAL LOW (ref 22–32)
Calcium: 9.1 mg/dL (ref 8.9–10.3)
Chloride: 109 mmol/L (ref 98–111)
Creatinine, Ser: 1.84 mg/dL — ABNORMAL HIGH (ref 0.44–1.00)
GFR, Estimated: 29 mL/min — ABNORMAL LOW (ref >60)
Glucose, Bld: 151 mg/dL — ABNORMAL HIGH (ref 70–99)
Phosphorus: 3.7 mg/dL (ref 2.5–4.6)
Potassium: 3.5 mmol/L (ref 3.5–5.1)
Sodium: 139 mmol/L (ref 135–145)

## 2022-06-30 LAB — C DIFFICILE QUICK SCREEN W PCR REFLEX
C Diff antigen: NEGATIVE
C Diff interpretation: NOT DETECTED
C Diff toxin: NEGATIVE

## 2022-06-30 LAB — CK
Total CK: 3047 U/L — ABNORMAL HIGH (ref 38–234)
Total CK: 2166 U/L — ABNORMAL HIGH (ref 38–234)

## 2022-06-30 MED ORDER — PROCHLORPERAZINE EDISYLATE 10 MG/2ML IJ SOLN
5.0000 mg | INTRAMUSCULAR | Status: DC | PRN
  Administered 2022-07-01: 5 mg via INTRAVENOUS
  Filled 2022-06-30 (×2): qty 2

## 2022-06-30 MED ORDER — LACTATED RINGERS IV SOLN: INTRAVENOUS | Status: DC

## 2022-06-30 MED ORDER — STERILE WATER FOR INJECTION IV SOLN
INTRAVENOUS | Status: DC
  Filled 2022-06-30 (×4): qty 1000

## 2022-06-30 MED ORDER — SODIUM CHLORIDE 0.9 % IV SOLN
2.0000 g | INTRAVENOUS | Status: DC
  Administered 2022-07-01: 2 g via INTRAVENOUS
  Filled 2022-06-30: qty 12.5

## 2022-06-30 MED ORDER — LACTATED RINGERS IV BOLUS
1000.0000 mL | Freq: Once | INTRAVENOUS | Status: AC
  Administered 2022-06-30: 1000 mL via INTRAVENOUS

## 2022-06-30 NOTE — Progress Notes (Signed)
Mobility Specialist Progress Note   06/30/22 1427  Mobility  Activity Refused mobility   Pt's spouse requesting to let pt rest d/t fatigue and lack of rest from previous night. Will follow up in the coming days.  [name] Mobility Specialist Please contact via SecureChat or  Rehab office at [phone]

## 2022-06-30 NOTE — TOC Initial Note (Signed)
Transition of Care [facility]) - Initial/Assessment Note    Patient Details  Name: Denise Bush MRN: [id number] Date of Birth: 23-Sep-1950  Transition of Care [facility]) CM/SW Contact:    Pollie Friar, RN Phone Number: 06/30/2022, 3:48 PM  Clinical Narrative:                 CM met with the patient and her spouse. They are together most of the time.  Pt manages her own medications and denies any issues.  Spouse provides needed transportation.  PCP: Dr Cletis [place] Pharmacy: [facility] on [address] following for d/c needs.   Expected Discharge Plan: Home/Self Care Barriers to Discharge: Continued Medical Work up   Patient Goals and CMS Choice            Expected Discharge Plan and Services   Discharge Planning Services: CM Consult   Living arrangements for the past 2 months: Single Family Home                                      Prior Living Arrangements/Services Living arrangements for the past 2 months: Single Family Home Lives with:: Spouse Patient language and need for interpreter reviewed:: Yes Do you feel safe going back to the place where you live?: Yes      Need for Family Participation in Patient Care: Yes (Comment) Care giver support system in place?: Yes (comment) Current home services: DME (shower seat/ cane/ walker/ rollator) Criminal Activity/Legal Involvement Pertinent to Current Situation/Hospitalization: No - Comment as needed  Activities of Daily Living Home Assistive Devices/Equipment: Other (Comment) ADL Screening (condition at time of admission) Patient's cognitive ability adequate to safely complete daily activities?: Yes Is the patient deaf or have difficulty hearing?: No Does the patient have difficulty seeing, even when wearing glasses/contacts?: Yes Does the patient have difficulty concentrating, remembering, or making decisions?: No Patient able to express need for assistance with ADLs?: Yes Does the patient have difficulty  dressing or bathing?: No Independently performs ADLs?: Yes (appropriate for developmental age) Does the patient have difficulty walking or climbing stairs?: No Weakness of Legs: None Weakness of Arms/Hands: None  Permission Sought/Granted                  Emotional Assessment Appearance:: Appears stated age   Affect (typically observed): Quiet Orientation: : Oriented to Self, Oriented to Situation, Oriented to  Time, Oriented to Place   Psych Involvement: No (comment)  Admission diagnosis:  Confusion [R41.0] Acute cystitis with hematuria [N30.01] AKI (acute kidney injury) ([place]) [N17.9] Sepsis ([place]) [A41.9] Patient Active Problem List   Diagnosis Date Noted   Sepsis ([place]) 06/29/2022   Urinary tract infection 06/29/2022   AKI (acute kidney injury) ([place]) 06/29/2022   Nausea, vomiting, and diarrhea 06/29/2022   High anion gap metabolic acidosis 0000000   Primary osteoarthritis of right knee 01/24/2022   S/P total knee arthroplasty, right 01/24/2022   Statin myopathy 10/06/2020   Poor vision 10/04/2020   Aortic atherosclerosis ([place]) by CT scan in 2017 04/21/2020   Pain due to onychomycosis of toenails of both feet 10/25/2018   Diabetic neuropathy ([place]) 10/25/2018   Kidney replaced by transplant 09/13/2018   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 02/14/2018   Immunosuppression ([place]) 01/02/2018   Prophylactic antibiotic 01/02/2018   Type 2 diabetes mellitus treated with insulin ([place]) 02/17/2017   Cataract associated with type 2 diabetes  mellitus ([place]) 02/17/2017   Overweight (BMI 25.0-29.9) 03/22/2015   Arthritis, degenerative 09/05/2014   Hyperlipidemia associated with type 2 diabetes mellitus ([place]) 06/24/2014   Vitamin D deficiency 06/24/2014   Medication management 06/24/2014   Diabetic proliferative retinopathy ([place]) 05/06/2012   Hypothyroidism 11/22/2006   Essential hypertension 11/22/2006   Allergic rhinitis 11/22/2006   PCP:  Willene Hatchet, NP Pharmacy:   [facility] - [address] [place] [address] Phone: [phone] Fax: [phone]  [facility] [address] [place], [place]. AT [address] [place] [address] Phone: [phone] Fax: [phone]  [facility] [place], [place] [address] [place] [address] Phone: [phone] Fax: [phone]     Social Determinants of Health (SDOH) Social History: SDOH Screenings   Food Insecurity: No Food Insecurity (06/29/2022)  Housing: Low Risk  (06/29/2022)  Transportation Needs: No Transportation Needs (06/29/2022)  Utilities: Not At Risk (06/29/2022)  Depression (PHQ2-9): Low Risk  (10/06/2020)  Tobacco Use: Low Risk  (06/29/2022)   SDOH Interventions:     Readmission Risk Interventions     No data to display

## 2022-06-30 NOTE — Progress Notes (Signed)
HD#0 Subjective:   Summary: Denise Bush is a 72 y.o. female with pertinent PMH of ESRD s/p DDKT 01/02/2018 on chronic immunosuppression with sirolimus and belatacept infusions, T2DM, HTN, hypothyroidism, and proliferative diabetic retinopathy who presents with 2 days of nausea, vomiting, and diarrhea with intermittent confusion and fevers and is admitted for sepsis with urinary tract infection and AKI.  Overnight Events: Pt has an episode of vomiting after trying to take oral tylenol.   Patient is stable this morning and has not gotten any better or worse.  She just had an episode of vomiting and diarrhea.  She is not currently nauseous. No more dysuria but she is having urinary urgency and incontinence. She denies any abdominal pain and is overall feeling she has a little more energy later today but is not back to herself.  Her husband agrees.  Objective:  Vital signs in last 24 hours: Vitals:   06/30/22 0238 06/30/22 0507 06/30/22 1009 06/30/22 1344  BP: (!) 106/48 (!) 149/80 (!) 145/53 (!) 137/54  Pulse: 83 90 93 85  Resp: '16 17 18 17  '$ Temp: 98 F (36.7 C) 98.2 F (36.8 C) (!) 100.6 F (38.1 C) 97.7 F (36.5 C)  TempSrc: Oral   Oral  SpO2: 93% 96% 95% 95%  Weight:      Height:       Supplemental O2: Room Air SpO2: 95 %   Physical Exam:  Constitutional: Slightly uncomfortable elderly female sitting in bedside chair. In no acute distress. Cardio:Regular rate and rhythm. 2+ bilateral radial pulses.  Palpable thrill over LUE AVF. Pulm:Clear to auscultation bilaterally. Normal work of breathing on room air. Abdomen: Soft, non-tender, non-distended, positive bowel sounds. [id number] for extremity edema. Skin:Warm and dry.  Slightly poor skin turgor. Neuro:Alert and oriented x3. No focal deficit noted.  Filed Weights   06/29/22 1038 06/29/22 1913  Weight: 72.1 kg 74.2 kg      Intake/Output Summary (Last 24 hours) at 06/30/2022 1409 Last data filed at 06/30/2022  1205 Gross per 24 hour  Intake 1736.94 ml  Output 100 ml  Net 1636.94 ml   Net IO Since Admission: 3,139.94 mL [06/30/22 1409]  Pertinent Labs:    Latest Ref Rng & Units 06/30/2022    8:27 AM 06/29/2022    1:19 PM 06/23/2022   10:10 AM  CBC  WBC 4.0 - 10.5 K/uL 4.0  4.1  2.7   Hemoglobin 12.0 - 15.0 g/dL 11.3  13.5  12.2   Hematocrit 36.0 - 46.0 % 36.8  43.6  38.6   Platelets 150 - 400 K/uL 121  142  176        Latest Ref Rng & Units 06/30/2022    8:27 AM 06/29/2022    8:56 PM 06/29/2022    1:19 PM  CMP  Glucose 70 - 99 mg/dL 151  121  142   BUN 8 - 23 mg/dL 34  34  31   Creatinine 0.44 - 1.00 mg/dL 1.84  1.70  1.62   Sodium 135 - 145 mmol/L 139  139  141   Potassium 3.5 - 5.1 mmol/L 3.5  3.7  4.3   Chloride 98 - 111 mmol/L 109  108  107   CO2 22 - 32 mmol/L '15  17  15   '$ Calcium 8.9 - 10.3 mg/dL 9.1  9.2  9.7   Total Protein 6.5 - 8.1 g/dL   7.6   Total Bilirubin 0.3 - 1.2 mg/dL   0.8  Alkaline Phos 38 - 126 U/L   81   AST 15 - 41 U/L   50   ALT 0 - 44 U/L   19     Assessment/Plan:   Principal Problem:   Sepsis ([place]) Active Problems:   Hypothyroidism   Essential hypertension   Type 2 diabetes mellitus treated with insulin ([place])   Kidney replaced by transplant   Urinary tract infection   AKI (acute kidney injury) ([facility])   Nausea, vomiting, and diarrhea   High anion gap metabolic acidosis   Patient Summary: Denise Bush is a 72 y.o. female with pertinent PMH of ESRD s/p DDKT 01/02/2018 on chronic immunosuppression with sirolimus and belatacept infusions, T2DM, HTN, hypothyroidism, and proliferative diabetic retinopathy who presents with 2 days of nausea, vomiting, and diarrhea with intermittent confusion and fevers and is admitted for sepsis with urinary tract infection and AKI, on hospital day 0   Sepsis Acute encephalopathy, intermittent UTI Continue to be intermittently febrile overnight with a temperature of 101.8.  Blood pressures have improved with  aggressive rehydration.  Continues without leukocytosis.  Blood cultures preliminarily negative.  Urine cultures pending, prior urine cultures from [facility] showed mixed flora considered to be contaminant but no dominant organisms seen in history.  We will discontinue vancomycin and metronidazole but continue on cefepime. - Continue cefepime day 2 - Continue to monitor fever curve, Tylenol as needed for fever - Continue IV fluids with sodium bicarb to address anion gap metabolic acidosis - Monitor blood and urine cultures - Morning CBC and renal function panel  AKI Nontraumatic rhabdomyolysis Anion gap metabolic acidosis Proteinuria Nephrology on board.  Creatinine continues to worsen with elevation to 1.84 this morning.  Most likely secondary to prerenal AKI and mild nontraumatic rhabdomyolysis.  Will continue with IV fluid rehydration and change to sodium bicarb to address her anion gap metabolic acidosis that did not improve overnight.  She is +3 L since admission. will continue to trend her CK and monitor her renal function.  Will also further evaluate her transplant kidney with renal ultrasound and bladder scan. - Sodium bicarb 150 mL/h - Daily renal function panel - Trend CK - Transplant renal ultrasound with Doppler - Bladder scan  ESRD s/p renal transplant 2019 on chronic immunosuppression Baseline creatinine of 1. S/p deceased donor kidney transplant on 01/02/2018. 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  History of BK nephropathy with consistently low viral levels while on belatacept.  She follows with [facility] for her renal transplant and was last seen on 03/01/2022.  We reached out to their office to make sure that they were aware of her admission.  On chronic immunosuppression with belatacept and sirolimus (was on mycophenolate from 12/2021 to 03/2022).   - sirolimus 2 mg daily - Appreciate nephrology's evaluation and management   Nausea,  vomiting, and diarrhea Seems to be a viral gastroenteritis that has caused ongoing volume depletion.  C. difficile and GI pathogen panel negative.  If he continues to have the symptoms we will consult GI for possible endoscopic evaluation with her history of renal transplant and immunosuppression. - IV fluids and IV prochlorperazine 5 mg every 6 hours as needed for nausea - Encourage p.o. intake as tolerated   Thrombocytopenia 142-121 with a component of dilution from IV fluid rehydration.  Platelet morphology normal.  We will continue to monitor.   Type 2 diabetes Last A1c was 7.3.  Lantus 23 units daily at home with  sliding scale insulin. - sensitive SSI - A1c   Hypertension We will hold her home medications of amlodipine and carvedilol in the setting of low blood pressures.  If remaining tachycardic and pressures are holding stable or elevated we will start back carvedilol.   Hypothyroidism Resume home levothyroxine 62.5 mcg every other day.  Diet: Carb-Modified IVF:  Sodium bicarb , 150 mL/h VTE: Heparin Code: DNR   Dispo: Pending improvement in renal function and further workup of acute kidney injury.  Johny Blamer, DO Internal Medicine Resident PGY-1 Pager: [phone]  Please contact the on call pager after 5 pm and on weekends at [phone].

## 2022-06-30 NOTE — Consult Note (Addendum)
Reason for Consult:AKI  Referring Physician: Johny Blamer  Chief Complaint: Sepsis 2/2 UTI w/ AKI an hx of DDKT  Assessment/Plan: UTI Sepsis Patient comes in for sepsis 2/2 to UTI, initially hypotensive, tachycardic, and febrile with intermittent AMS. UA consistent w/ UTI w/ small leukocytes, >300 protein, large hgb and small bilirubin. Microscopy showed many bacteria. Patient found down at home for at least 2 hours, thus large hgb and protein likely 2/2 muscle breakdown/myoglobin. Bilirubin likely elevated in setting of myoglobin breakdown. Patient febrile to 100.6 this morning. Current ABX: Vanc, Cefipime, metronidazole, consider change to CFTX 1 g daily F/u blood/urine cultures AM CBC and RFP  AKI Baseline Cr of 1, w/ Cr on admission 1.6, and worsened to 1.7 overnight, 1.8 this morning. AKI thought to be prerenal 2/2 GI losses at patient has had n/v for two days prior to admission, although patient on IVF overnight. Etiology may also be intra-renal as patient was intermittently hypotensive on admission w/ BP as low as 93/67, that could have caused ATN. Patient on mIVF at 150 cc/hr overnight, stopped this morning, s/p 2 L LR yesterday. CK overnight 2,935 and increased to 3,047 this morning. Low concern for rhabdomyolysis, but could be contributing to AKI. Continue mIVF Consider bladder scan for post renal causes Trend CK AGMA Resolved, admitted w/ AG of 19 and lactic acid 1.6 on admission, likely in setting of sepsis w/ hypotension. DDKT S/p renal transplant 2019 on chronic immunosuppression w/ belatacept and sirolimus. Follows w/ Dr. Lesle Chris and Dr. Pierce Crane at [facility], last seen 03/01/22. Previously on mycophenolate from 12/2021-03/2022. Continue sirolimus 2 mg daily DM2 On SSI, managed per primary team HTN BP fluctuated between [id number] systolic overnight. BP range appropriate, consider restarting BP med's once patient stabilized.   HPI: Denise Bush is an 72 y.o. female who  presents w/ sepsis 2/2 to UTI, complicated by AKI and hx of DDKT. Patient has had 48 hours of vomiting and diarrhea, found on floor at home and was down for at least 2 hours before EMS got her up. She saw her doctor yesterday who recommended she go to ED. Patient came in volume depleted and was given 2 L bolus of LR and started on antibiotics overnight.   ROS Pertinent items are noted in HPI.  Chemistry and CBC: Creat  Date/Time Value Ref Range Status  01/20/2021 11:12 AM 0.93 0.50 - 1.05 mg/dL Final  10/06/2020 12:26 PM 0.99 0.50 - 0.99 mg/dL Final    Comment:    For patients >19 years of age, the reference limit for Creatinine is approximately 13% higher for people identified as African-American. .   04/21/2020 04:58 PM 1.08 (H) 0.50 - 0.99 mg/dL Final    Comment:    For patients >44 years of age, the reference limit for Creatinine is approximately 13% higher for people identified as African-American. .   02/04/2020 02:05 PM 1.02 (H) 0.50 - 0.99 mg/dL Final    Comment:    For patients >3 years of age, the reference limit for Creatinine is approximately 13% higher for people identified as African-American. Marland Kitchen   01/16/2020 10:56 AM 1.34 (H) 0.50 - 0.99 mg/dL Final    Comment:    For patients >72 years of age, the reference limit for Creatinine is approximately 13% higher for people identified as African-American. Marland Kitchen   10/07/2019 10:51 AM 0.91 0.50 - 0.99 mg/dL Final    Comment:    For patients >27 years of age, the reference limit for Creatinine  is approximately 13% higher for people identified as African-American. Marland Kitchen   06/06/2019 10:49 AM 0.97 0.50 - 0.99 mg/dL Final    Comment:    For patients >67 years of age, the reference limit for Creatinine is approximately 13% higher for people identified as African-American. .   02/25/2019 09:46 AM 0.92 0.50 - 0.99 mg/dL Final    Comment:    For patients >34 years of age, the reference limit for Creatinine is approximately  13% higher for people identified as African-American. Marland Kitchen   10/03/2017 03:13 PM 8.57 (H) 0.50 - 0.99 mg/dL Final    Comment:    Verified by repeat analysis. . For patients >64 years of age, the reference limit for Creatinine is approximately 13% higher for people identified as African-American. Marland Kitchen   06/20/2017 04:15 PM 10.63 (H) 0.50 - 0.99 mg/dL Final    Comment:    Verified by repeat analysis. . For patients >75 years of age, the reference limit for Creatinine is approximately 13% higher for people identified as African-American. .   02/21/2017 03:05 PM 9.69 (H) 0.50 - 0.99 mg/dL Final    Comment:    Verified by repeat analysis. . For patients >37 years of age, the reference limit for Creatinine is approximately 13% higher for people identified as African-American. Marland Kitchen   11/22/2016 03:39 PM 9.33 (H) 0.50 - 0.99 mg/dL Final    Comment:      For patients > or = 71 years of age: The upper reference limit for Creatinine is approximately 13% higher for people identified as African-American.     07/19/2016 03:02 PM 8.17 (H) 0.50 - 0.99 mg/dL Final    Comment:    Result repeated and verified.   For patients > or = 71 years of age: The upper reference limit for Creatinine is approximately 13% higher for people identified as African-American.     09/29/2015 03:44 PM 8.94 (H) 0.50 - 0.99 mg/dL Final    Comment:    Result repeated and verified.  06/28/2015 03:45 PM 8.81 (H) 0.50 - 0.99 mg/dL Final    Comment:    Result repeated and verified.  03/22/2015 03:53 PM 6.69 (H) 0.50 - 0.99 mg/dL Final  12/14/2014 03:43 PM 4.49 (H) 0.50 - 0.99 mg/dL Final  09/01/2014 05:15 PM 3.79 (H) 0.50 - 1.10 mg/dL Final  06/24/2014 01:10 PM 3.50 (H) 0.50 - 1.10 mg/dL Final   Creatinine, Ser  Date/Time Value Ref Range Status  06/29/2022 08:56 PM 1.70 (H) 0.44 - 1.00 mg/dL Final  06/29/2022 01:19 PM 1.62 (H) 0.44 - 1.00 mg/dL Final  06/23/2022 10:10 AM 0.91 0.44 - 1.00 mg/dL Final   05/26/2022 10:08 AM 0.89 0.44 - 1.00 mg/dL Final  04/28/2022 09:15 AM 0.89 0.44 - 1.00 mg/dL Final  03/31/2022 10:30 AM 0.86 0.44 - 1.00 mg/dL Final  03/03/2022 09:14 AM 0.94 0.44 - 1.00 mg/dL Final  12/28/2021 10:58 AM 0.94 0.44 - 1.00 mg/dL Final  11/29/2021 10:07 AM 0.84 0.44 - 1.00 mg/dL Final  10/31/2021 10:08 AM 1.04 (H) 0.44 - 1.00 mg/dL Final  09/05/2021 12:05 PM 1.03 (H) 0.44 - 1.00 mg/dL Final  08/08/2021 10:17 AM 0.96 0.44 - 1.00 mg/dL Final  07/11/2021 10:33 AM 1.01 (H) 0.44 - 1.00 mg/dL Final  06/14/2021 10:16 AM 0.92 0.44 - 1.00 mg/dL Final  05/17/2021 10:23 AM 1.05 (H) 0.44 - 1.00 mg/dL Final  04/19/2021 10:30 AM 1.09 (H) 0.44 - 1.00 mg/dL Final  03/22/2021 08:24 AM 0.92 0.44 - 1.00 mg/dL Final  02/16/2021 11:34 AM 1.16 (H) 0.44 - 1.00 mg/dL Final  01/19/2021 11:59 AM 0.93 0.44 - 1.00 mg/dL Final  12/22/2020 11:04 AM 0.99 0.44 - 1.00 mg/dL Final  11/24/2020 12:12 PM 1.24 (H) 0.44 - 1.00 mg/dL Final  10/21/2020 11:11 AM 0.99 0.44 - 1.00 mg/dL Final  09/23/2020 11:12 AM 0.96 0.44 - 1.00 mg/dL Final  08/26/2020 10:52 AM 1.01 (H) 0.44 - 1.00 mg/dL Final  07/29/2020 10:53 AM 1.04 (H) 0.44 - 1.00 mg/dL Final  07/01/2020 11:08 AM 0.98 0.44 - 1.00 mg/dL Final  10/24/2010 09:51 AM 1.13 (H) 0.50 - 1.10 mg/dL Final    Comment:    **Please note change in reference range.**  06/25/2007 10:04 AM 1.0 0.4 - 1.2 mg/dL Final   Recent Labs  Lab 06/23/22 1010 06/29/22 1319 06/29/22 2056  NA 139 141 139  K 4.3 4.3 3.7  CL 108 107 108  CO2 22 15* 17*  GLUCOSE 163* 142* 121*  BUN 23 31* 34*  CREATININE 0.91 1.62* 1.70*  CALCIUM 9.7 9.7 9.2  PHOS  --   --  3.7   Recent Labs  Lab 06/23/22 1010 06/29/22 1319 06/30/22 0827  WBC 2.7* 4.1 4.0  NEUTROABS 1.1* 3.0 PENDING  HGB 12.2 13.5 11.3*  HCT 38.6 43.6 36.8  MCV 88.7 89.7 90.4  PLT 176 142* 121*   Liver Function Tests: Recent Labs  Lab 06/29/22 1319 06/29/22 2056  AST 50*  --   ALT 19  --   ALKPHOS 81  --    BILITOT 0.8  --   PROT 7.6  --   ALBUMIN 3.7 3.2*   Recent Labs  Lab 06/29/22 1319  LIPASE 23   No results for input(s): "AMMONIA" in the last 168 hours. Cardiac Enzymes: Recent Labs  Lab 06/29/22 2056  CKTOTAL 2,935*   Iron Studies: No results for input(s): "IRON", "TIBC", "TRANSFERRIN", "FERRITIN" in the last 72 hours. PT/INR: '@LABRCNTIP'$ (inr:5)  Xrays/Other Studies: ) Results for orders placed or performed during the hospital encounter of 06/29/22 (from the past 48 hour(s))  Urinalysis, Routine w reflex microscopic -Urine, Clean Catch     Status: Abnormal   Collection Time: 06/29/22 10:50 AM  Result Value Ref Range   Color, Urine YELLOW YELLOW   APPearance CLEAR CLEAR   Specific Gravity, Urine 1.025 1.005 - 1.030   pH 6.0 5.0 - 8.0   Glucose, UA NEGATIVE NEGATIVE mg/dL   Hgb urine dipstick LARGE (A) NEGATIVE   Bilirubin Urine SMALL (A) NEGATIVE   Ketones, ur NEGATIVE NEGATIVE mg/dL   Protein, ur >300 (A) NEGATIVE mg/dL   Nitrite NEGATIVE NEGATIVE   Leukocytes,Ua SMALL (A) NEGATIVE    Comment: Performed at [facility] [address] [place], [place] [address]  Urinalysis, Microscopic (reflex)     Status: Abnormal   Collection Time: 06/29/22 10:50 AM  Result Value Ref Range   RBC / HPF 0-5 0 - 5 RBC/hpf   WBC, UA >50 0 - 5 WBC/hpf   Bacteria, UA MANY (A) NONE SEEN   Squamous Epithelial / HPF 0-5 0 - 5 /HPF   WBC Clumps PRESENT    Mucus PRESENT     Comment: Performed at [facility], [place] [address] [place], [place] [address]  Resp panel by RT-PCR (RSV, Flu A&B, Covid) Anterior Nasal Swab     Status: None   Collection Time: 06/29/22 11:00 AM   Specimen: Anterior Nasal Swab  Result Value Ref Range   SARS Coronavirus 2  by RT PCR NEGATIVE NEGATIVE   Influenza A by PCR NEGATIVE NEGATIVE   Influenza B by PCR NEGATIVE NEGATIVE    Comment: (NOTE) The Xpert Xpress SARS-CoV-2/FLU/RSV plus assay is intended as an aid in the diagnosis of influenza from  Nasopharyngeal swab specimens and should not be used as a sole basis for treatment. Nasal washings and aspirates are unacceptable for Xpert Xpress SARS-CoV-2/FLU/RSV testing.  Fact Sheet for Patients: EntrepreneurPulse.com.au  Fact Sheet for Healthcare Providers: IncredibleEmployment.be  This test is not yet approved or cleared by the [place] FDA and has been authorized for detection and/or diagnosis of SARS-CoV-2 by FDA under an Emergency Use Authorization (EUA). This EUA will remain in effect (meaning this test can be used) for the duration of the COVID-19 declaration under Section 564(b)(1) of the Act, 21 U.S.C. section 360bbb-3(b)(1), unless the authorization is terminated or revoked.     Resp Syncytial Virus by PCR NEGATIVE NEGATIVE    Comment: (NOTE) Fact Sheet for Patients: EntrepreneurPulse.com.au  Fact Sheet for Healthcare Providers: IncredibleEmployment.be  This test is not yet approved or cleared by the [place] FDA and has been authorized for detection and/or diagnosis of SARS-CoV-2 by FDA under an Emergency Use Authorization (EUA). This EUA will remain in effect (meaning this test can be used) for the duration of the COVID-19 declaration under Section 564(b)(1) of the Act, 21 U.S.C. section 360bbb-3(b)(1), unless the authorization is terminated or revoked.  Performed at [facility], [place] [address] [place], [place] [address]   Comprehensive metabolic panel     Status: Abnormal   Collection Time: 06/29/22  1:19 PM  Result Value Ref Range   Sodium 141 135 - 145 mmol/L   Potassium 4.3 3.5 - 5.1 mmol/L   Chloride 107 98 - 111 mmol/L   CO2 15 (L) 22 - 32 mmol/L   Glucose, Bld 142 (H) 70 - 99 mg/dL    Comment: Glucose reference range applies only to samples taken after fasting for at least 8 hours.   BUN 31 (H) 8 - 23 mg/dL   Creatinine, Ser 1.62 (H) 0.44 - 1.00 mg/dL    Calcium 9.7 8.9 - 10.3 mg/dL   Total Protein 7.6 6.5 - 8.1 g/dL   Albumin 3.7 3.5 - 5.0 g/dL   AST 50 (H) 15 - 41 U/L   ALT 19 0 - 44 U/L   Alkaline Phosphatase 81 38 - 126 U/L   Total Bilirubin 0.8 0.3 - 1.2 mg/dL   GFR, Estimated 34 (L) >60 mL/min    Comment: (NOTE) Calculated using the CKD-EPI Creatinine Equation (2021)    Anion gap 19 (H) 5 - 15    Comment: Performed at [facility], [place] [address] [place], [place] [address]  CBC with Differential     Status: Abnormal   Collection Time: 06/29/22  1:19 PM  Result Value Ref Range   WBC 4.1 4.0 - 10.5 K/uL   RBC 4.86 3.87 - 5.11 MIL/uL   Hemoglobin 13.5 12.0 - 15.0 g/dL   HCT 43.6 36.0 - 46.0 %   MCV 89.7 80.0 - 100.0 fL   MCH 27.8 26.0 - 34.0 pg   MCHC 31.0 30.0 - 36.0 g/dL   RDW 14.9 11.5 - 15.5 %   Platelets 142 (L) 150 - 400 K/uL   nRBC 0.0 0.0 - 0.2 %   Neutrophils Relative % 59 %   Neutro Abs 3.0 1.7 - 7.7 K/uL   Band Neutrophils 14 %  Lymphocytes Relative 21 %   Lymphs Abs 0.9 0.7 - 4.0 K/uL   Monocytes Relative 6 %   Monocytes Absolute 0.2 0.1 - 1.0 K/uL   Eosinophils Relative 0 %   Eosinophils Absolute 0.0 0.0 - 0.5 K/uL   Basophils Relative 0 %   Basophils Absolute 0.0 0.0 - 0.1 K/uL   WBC Morphology WHITE COUNT CONFIRMED ON SMEAR    RBC Morphology MORPHOLOGY UNREMARKABLE    Smear Review PLATELET COUNT CONFIRMED BY SMEAR    nRBC 0 0 /100 WBC   Abs Immature Granulocytes 0.00 0.00 - 0.07 K/uL    Comment: Performed at [facility], [place] [address], [place], [place] [address]  Lipase, blood     Status: None   Collection Time: 06/29/22  1:19 PM  Result Value Ref Range   Lipase 23 11 - 51 U/L    Comment: Performed at [facility] [address], [place], [place] [address]  Protime-INR     Status: Abnormal   Collection Time: 06/29/22  3:39 PM  Result Value Ref Range   Prothrombin Time 17.1 (H) 11.4 - 15.2 seconds   INR 1.4 (H) 0.8 - 1.2    Comment: (NOTE) INR goal varies based on device  and disease states. Performed at [facility], [place] [address], [place], [place] [address]   APTT     Status: None   Collection Time: 06/29/22  3:39 PM  Result Value Ref Range   aPTT 31 24 - 36 seconds    Comment: Performed at [facility] [address], [place], [place] [address]  Blood Culture (routine x 2)     Status: None (Preliminary result)   Collection Time: 06/29/22  3:39 PM   Specimen: BLOOD  Result Value Ref Range   Specimen Description BLOOD SITE NOT SPECIFIED    Special Requests      BOTTLES DRAWN AEROBIC AND ANAEROBIC Blood Culture adequate volume   Culture      NO GROWTH < 12 HOURS Performed at [facility], [place] [address], [place], [place] [address]    Report Status PENDING   Lactic acid, plasma     Status: None   Collection Time: 06/29/22  3:40 PM  Result Value Ref Range   Lactic Acid, Venous 1.3 0.5 - 1.9 mmol/L    Comment: Performed at [facility], [place] [address] [place], [place] [address]  Lactic acid, plasma     Status: None   Collection Time: 06/29/22  4:41 PM  Result Value Ref Range   Lactic Acid, Venous 1.6 0.5 - 1.9 mmol/L    Comment: Performed at [facility] [address], [place],  [address]  Blood Culture (routine x 2)     Status: None (Preliminary result)   Collection Time: 06/29/22  4:41 PM   Specimen: BLOOD RIGHT HAND  Result Value Ref Range   Specimen Description BLOOD RIGHT HAND    Special Requests      BOTTLES DRAWN AEROBIC AND ANAEROBIC Blood Culture results may not be optimal due to an inadequate volume of blood received in culture bottles   Culture      NO GROWTH < 12 HOURS Performed at [facility] [address], [place],  [address]    Report Status PENDING   HIV Antibody (routine testing w rflx)     Status: None   Collection Time: 06/29/22  8:56 PM  Result  Value Ref Range   HIV Screen 4th Generation wRfx Non Reactive Non Reactive    Comment: Performed at [facility], [place] [address], [place], [place] [address]  Renal function panel     Status: Abnormal   Collection Time: 06/29/22  8:56 PM  Result Value Ref Range   Sodium 139 135 - 145 mmol/L   Potassium 3.7 3.5 - 5.1 mmol/L   Chloride 108 98 - 111 mmol/L   CO2 17 (L) 22 - 32 mmol/L   Glucose, Bld 121 (H) 70 - 99 mg/dL    Comment: Glucose reference range applies only to samples taken after fasting for at least 8 hours.   BUN 34 (H) 8 - 23 mg/dL   Creatinine, Ser 1.70 (H) 0.44 - 1.00 mg/dL   Calcium 9.2 8.9 - 10.3 mg/dL   Phosphorus 3.7 2.5 - 4.6 mg/dL   Albumin 3.2 (L) 3.5 - 5.0 g/dL   GFR, Estimated 32 (L) >60 mL/min    Comment: (NOTE) Calculated using the CKD-EPI Creatinine Equation (2021)    Anion gap 14 5 - 15    Comment: Performed at [facility] [address], [place], [place] [address]  CK     Status: Abnormal   Collection Time: 06/29/22  8:56 PM  Result Value Ref Range   Total CK 2,935 (H) 38 - 234 U/L    Comment: Performed at [facility], [place] [address] [place], [place] [address]  Glucose, capillary     Status: Abnormal   Collection Time: 06/29/22  9:15 PM  Result Value Ref Range   Glucose-Capillary 121 (H) 70 - 99 mg/dL    Comment: Glucose reference range applies only to samples taken after fasting for at least 8 hours.  C Difficile Quick Screen w PCR reflex     Status: None   Collection Time: 06/30/22  5:15 AM   Specimen: STOOL  Result Value Ref Range   C Diff antigen NEGATIVE NEGATIVE   C Diff toxin NEGATIVE NEGATIVE   C Diff interpretation No C. difficile detected.     Comment: Performed at [facility], [place] [address], [place], [place] [address]  Glucose, capillary     Status: Abnormal   Collection Time: 06/30/22  7:26 AM  Result Value Ref Range   Glucose-Capillary 135 (H) 70 - 99 mg/dL    Comment: Glucose reference range applies only to samples taken after fasting for at least 8 hours.   Comment 1 Document in Chart   CBC with Differential/Platelet      Status: Abnormal (Preliminary result)   Collection Time: 06/30/22  8:27 AM  Result Value Ref Range   WBC 4.0 4.0 - 10.5 K/uL   RBC 4.07 3.87 - 5.11 MIL/uL   Hemoglobin 11.3 (L) 12.0 - 15.0 g/dL   HCT 36.8 36.0 - 46.0 %   MCV 90.4 80.0 - 100.0 fL   MCH 27.8 26.0 - 34.0 pg   MCHC 30.7 30.0 - 36.0 g/dL   RDW 15.0 11.5 - 15.5 %   Platelets 121 (L) 150 - 400 K/uL   nRBC 0.0 0.0 - 0.2 %    Comment: Performed at [facility], [place] [address] [place], [place] [address]   Neutrophils Relative % PENDING %   Neutro Abs PENDING 1.7 - 7.7 K/uL   Band Neutrophils PENDING %   Lymphocytes Relative PENDING %   Lymphs Abs PENDING 0.7 - 4.0 K/uL   Monocytes  Relative PENDING %   Monocytes Absolute PENDING 0.1 - 1.0 K/uL   Eosinophils Relative PENDING %   Eosinophils Absolute PENDING 0.0 - 0.5 K/uL   Basophils Relative PENDING %   Basophils Absolute PENDING 0.0 - 0.1 K/uL   WBC Morphology PENDING    RBC Morphology PENDING    Smear Review PENDING    Other PENDING %   nRBC PENDING 0 /100 WBC   Metamyelocytes Relative PENDING %   Myelocytes PENDING %   Promyelocytes Relative PENDING %   Blasts PENDING %   Immature Granulocytes PENDING %   Abs Immature Granulocytes PENDING 0.00 - 0.07 K/uL   DG Chest Port 1 View  Result Date: 06/29/2022 CLINICAL DATA:  Questionable sepsis - evaluate for abnormality EXAM: PORTABLE CHEST - 1 VIEW COMPARISON:  11/29/2021 FINDINGS: Cardiac silhouette is unremarkable. No pneumothorax or pleural effusion. The lungs are clear. Aorta is calcified. There are thoracic degenerative changes. IMPRESSION: No acute cardiopulmonary process. Electronically Signed   By: Sammie Bench M.D.   On: 06/29/2022 15:08   CT Head Wo Contrast  Result Date: 06/29/2022 CLINICAL DATA:  Patient complains of nausea, vomiting, headache and intermittent aphasia with slurred speech. EXAM: CT HEAD WITHOUT CONTRAST TECHNIQUE: Contiguous axial images were obtained from the base of the skull  through the vertex without intravenous contrast. RADIATION DOSE REDUCTION: This exam was performed according to the departmental dose-optimization program which includes automated exposure control, adjustment of the mA and/or kV according to patient size and/or use of iterative reconstruction technique. COMPARISON:  None Available. FINDINGS: Brain: No evidence of acute infarction, hemorrhage, hydrocephalus, extra-axial collection or mass lesion/mass effect. There is mild patchy low-attenuation within the subcortical and periventricular white matter compatible with chronic microvascular disease. Vascular: No hyperdense vessel or unexpected calcification. Skull: Normal. Negative for fracture or focal lesion. Sinuses/Orbits: Right lobe appears normal. The left globe is dense with peripheral dystrophic calcifications. Paranasal sinuses and mastoid air cells are clear. Other: None IMPRESSION: 1. No acute intracranial abnormalities. 2. Mild chronic microvascular change Electronically Signed   By: Kerby Moors M.D.   On: 06/29/2022 11:18    PMH:   Past Medical History:  Diagnosis Date   Allergy    Arthritis    Blood transfusion without reported diagnosis    Cataract    bilateral   Diabetic retinopathy ([place])    End stage renal disease on dialysis due to type 2 diabetes mellitus ([place]) 03/22/2015   ESRD (end stage renal disease) ([place])    Hashimoto's disease    Hyperlipidemia    Hyperparathyroidism, secondary renal ([place]) 06/25/2015   Hypertension    Hypothyroid    Loss of vision    both eyes, no vision left eye and little in left     PSH:   Past Surgical History:  Procedure Laterality Date   ABDOMINAL HYSTERECTOMY     BREAST BIOPSY Right 2015   benign   COLONOSCOPY     EYE SURGERY     KNEE SURGERY     TOTAL KNEE ARTHROPLASTY Right 01/24/2022   Procedure: RIGHT TOTAL KNEE ARTHROPLASTY;  Surgeon: Melrose Nakayama, MD;  Location: [facility];  Service: Orthopedics;  Laterality: Right;    Allergies:   Allergies  Allergen Reactions   Hydralazine Shortness Of Breath    Chest pain Fatigue    Azor [Amlodipine-Olmesartan] Other (See Comments)    Sharp chest pain     Benadryl [Diphenhydramine] Other (See Comments)    Unknown reaction   Camellia Other (  See Comments)    Unknown reaction   Denaverine Other (See Comments)    Unknown reaction   Dyazide [Hydrochlorothiazide W-Triamterene] Swelling and Other (See Comments)    Chest pain Throat swelling   Glucophage [Metformin] Diarrhea, Itching and Nausea Only    Bowel incontinence    Hydrochlorothiazide Other (See Comments)    "contractions in throat"    Laureth Other (See Comments)    Unknown reaction   Prednisone Other (See Comments)    Elevated Glucose    Reglan [Metoclopramide] Other (See Comments)    Worsening acid reflux   Trandate [Labetalol] Other (See Comments)    Foot pain   Zestril [Lisinopril] Other (See Comments)    Severe radiating foot pain.   Aldactone [Spironolactone] Rash and Other (See Comments)    Severe radiating pain in feet    Rogaine [Minoxidil] Rash and Other (See Comments)    Fatigue  Choking, throat contractions   Statins Rash    Fatigue    Medications:   Prior to Admission medications   Medication Sig Start Date End Date Taking? Authorizing Provider  acetaminophen (TYLENOL) 500 MG tablet Take 500 mg by mouth daily as needed.   Yes [provider]  amLODipine (NORVASC) 2.5 MG tablet Take one tablet twice a day Patient taking differently: Take 2.5 mg by mouth 2 (two) times daily. 04/29/20  Yes Liane Comber, NP  aspirin EC 81 MG tablet Take 1 tablet (81 mg total) by mouth 2 (two) times daily after a meal. For 2 weeks then back to once a day for DVT prevention. Patient taking differently: Take 81 mg by mouth daily. 01/24/22  Yes Loni Dolly, PA-C  BELATACEPT IV Inject 1 Dose into the vein every 28 (twenty-eight) days. 07/16/18  Yes [provider]  carvedilol (COREG) 6.25 MG  tablet Take 6.25 mg by mouth 2 (two) times daily with a meal.   Yes [provider]  ezetimibe (ZETIA) 10 MG tablet Take 1 tablet (10 mg total) by mouth daily. 02/21/17  Yes Liane Comber, NP  gemfibrozil (LOPID) 600 MG tablet TAKE 1 TABLET TWICE A DAY WITH MEALS FOR CHOLESTEROL AND TRIGLYCERIDES Patient taking differently: Take 600 mg by mouth 2 (two) times daily. 02/16/22  Yes Alycia Rossetti, NP  insulin glargine (LANTUS SOLOSTAR) 100 UNIT/ML Solostar Pen INJECT 20 UNITS INTO THE SKIN DAILY FOR DIABETES Patient taking differently: Inject 23 Units into the skin daily at 12 noon. 10/20/20  Yes Unk Pinto, MD  insulin lispro (HUMALOG) 100 UNIT/ML KwikPen Inject 3-5 Units into the skin 2 (two) times daily as needed. Inject 3-5 units up to twice daily with meals as needed for hyperglycemia. Dose dependant on fasting blood sugar and meal content. 03/30/20  Yes [provider]  ketoconazole (NIZORAL) 2 % cream Apply 1 Application topically daily as needed for irritation (redness, scaling).   Yes [provider]  levothyroxine (SYNTHROID) 125 MCG tablet Take 62.5 mcg by mouth every other day.   Yes [provider]  linagliptin (TRADJENTA) 5 MG TABS tablet Take 5 mg by mouth daily. 10/14/18  Yes [provider]  sirolimus (RAPAMUNE) 1 MG tablet Take    1 tablet     Daily     for Kidney Transplant Patient taking differently: Take 2 mg by mouth daily at 12 noon. 02/10/20  Yes Unk Pinto, MD    Discontinued Meds:   Medications Discontinued During This Encounter  Medication Reason   vancomycin (VANCOCIN) IVPB 1000 mg/200 mL  premix    Continuous Blood Gluc Receiver (FREESTYLE LIBRE 14 DAY READER) DEVI    Continuous Blood Gluc Sensor (FREESTYLE LIBRE 14 DAY SENSOR) MISC    glucose blood test strip    Insulin Pen Needle (FIFTY50 PEN NEEDLES) 31G X 5 MM MISC    Lancets Misc. (UNISTIK 2 NORMAL) MISC    mycophenolate (CELLCEPT) 250 MG capsule  Discontinued by provider   levothyroxine (SYNTHROID) tablet 125 mcg    insulin glargine-yfgn (SEMGLEE) injection 23 Units    ceFEPIme (MAXIPIME) 2 g in sodium chloride 0.9 % 100 mL IVPB    acetaminophen (TYLENOL) tablet 650 mg    HYDROcodone-acetaminophen (NORCO/VICODIN) 5-325 MG tablet Completed Course   tiZANidine (ZANAFLEX) 2 MG tablet Completed Course    Social History:  reports that she has never smoked. She has never used smokeless tobacco. She reports that she does not drink alcohol and does not use drugs.  Family History:   Family History  Problem Relation Age of Onset   Breast cancer Sister    CAD Other        No family history   Colon polyps Maternal Aunt    Breast cancer Maternal Aunt    Colon polyps Maternal Uncle    Breast cancer Maternal Grandmother    Breast cancer Maternal Aunt    Colon cancer Neg Hx     Blood pressure (!) 149/80, pulse 90, temperature 98.2 F (36.8 C), resp. rate 17, height '5\' 5"'$  (1.651 m), weight 74.2 kg, SpO2 96 %. General appearance: alert, cooperative, no distress, and ill appearing, patient legally blind Eyes: conjunctivae/corneas clear. PERRL, EOM's intact. Fundi benign. Throat: lips, mucosa, and tongue normal; teeth and gums normal Cardio: S1, S2 normal, no rub, and no murumur Abd: Soft NTTP, non-distended Extremities: No pitting edema       Holley Bouche, MD 06/30/2022, 9:11 AM

## 2022-06-30 NOTE — Progress Notes (Signed)
PHARMACY NOTE:  ANTIMICROBIAL RENAL DOSAGE ADJUSTMENT  Current antimicrobial regimen includes a mismatch between antimicrobial dosage and estimated renal function.  As per policy approved by the Pharmacy & Therapeutics and Medical Executive Committees, the antimicrobial dosage will be adjusted accordingly.  Current antimicrobial dosage:  cefepime 2h q12hr  Indication: urosepsis  Renal Function:  Estimated Creatinine Clearance: 28.7 mL/min (A) (by C-G formula based on SCr of 1.84 mg/dL (H)).    Antimicrobial dosage has been changed to:  cefepime 2g q24 hr    Thank you for allowing pharmacy to be a part of this patient's care.  Benetta Spar, PharmD, BCPS, BCCP Clinical Pharmacist  Please check AMION for all [place] phone numbers After 10:00 PM, call [place] [phone]

## 2022-06-30 NOTE — TOC Progression Note (Signed)
Transition of Care [facility]) - Initial/Assessment Note    Patient Details  Name: Denise Bush MRN: [id number] Date of Birth: May 17, 1950  Transition of Care [facility]) CM/SW Contact:    Milinda Antis, LCSWA Phone Number: 06/30/2022, 3:42 PM  Clinical Narrative:                  Transition of Care Department [facility]) has reviewed patient.  Patient admitted for Sepsis  and is from home with spouse.  We will continue to monitor patient advancement through interdisciplinary progression rounds. If new patient transition needs arise, please place a TOC consult.          Patient Goals and CMS Choice            Expected Discharge Plan and Services                                              Prior Living Arrangements/Services                       Activities of Daily Living Home Assistive Devices/Equipment: Other (Comment) ADL Screening (condition at time of admission) Patient's cognitive ability adequate to safely complete daily activities?: Yes Is the patient deaf or have difficulty hearing?: No Does the patient have difficulty seeing, even when wearing glasses/contacts?: Yes Does the patient have difficulty concentrating, remembering, or making decisions?: No Patient able to express need for assistance with ADLs?: Yes Does the patient have difficulty dressing or bathing?: No Independently performs ADLs?: Yes (appropriate for developmental age) Does the patient have difficulty walking or climbing stairs?: No Weakness of Legs: None Weakness of Arms/Hands: None  Permission Sought/Granted                  Emotional Assessment              Admission diagnosis:  Confusion [R41.0] Acute cystitis with hematuria [N30.01] AKI (acute kidney injury) ([place]) [N17.9] Sepsis ([place]) [A41.9] Patient Active Problem List   Diagnosis Date Noted   Sepsis ([place]) 06/29/2022   Urinary tract infection 06/29/2022   AKI (acute kidney injury) (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 02/14/2018   Immunosuppression ([place]) 01/02/2018   Prophylactic antibiotic 01/02/2018   Type 2 diabetes mellitus treated with insulin ([place]) 02/17/2017   Cataract associated with type 2 diabetes mellitus ([place]) 02/17/2017   Overweight (BMI 25.0-29.9) 03/22/2015   Arthritis, degenerative 09/05/2014   Hyperlipidemia associated with type 2 diabetes mellitus ([place]) 06/24/2014   Vitamin D deficiency 06/24/2014   Medication management 06/24/2014   Diabetic proliferative retinopathy ([place]) 05/06/2012   Hypothyroidism 11/22/2006   Essential hypertension 11/22/2006   Allergic rhinitis 11/22/2006   PCP:  Willene Hatchet, NP Pharmacy:   [facility] - [place], [place] - [address] [place] [address] Phone: [phone] Fax: [phone]  [facility] [address] [place], [place] AT [place]. [place] [address] Phone: [phone] Fax: [phone]  [facility] [place], [place] [address] [place] [address] Phone: [phone] Fax: [phone]     Social Determinants of Health (SDOH) Social History: SDOH Screenings   Food Insecurity: No Food Insecurity (06/29/2022)  Housing: Low Risk  (06/29/2022)  Transportation Needs: No Transportation Needs (06/29/2022)  Utilities: Not At Risk (06/29/2022)  Depression (PHQ2-9):  Low Risk  (10/06/2020)  Tobacco Use: Low Risk  (06/29/2022)   SDOH Interventions:     Readmission Risk Interventions     No data to display

## 2022-06-30 NOTE — Progress Notes (Signed)
Pt noted with temp-101.8, HR-102, BP-148/55, now Yellow MEWS. Gave tylenol PO to address temperature, immediately vomited after administration. Zofran IV given at 2218, ordered every 8 hours prn. Angelique Blonder, DO notified, new orders noted.

## 2022-06-30 NOTE — Progress Notes (Signed)
   06/29/22 2315  Assess: MEWS Score  Temp (!) 101.8 F (38.8 C)  BP (!) 148/55  MAP (mmHg) 83  Pulse Rate (!) 102  ECG Heart Rate 100  Resp 18  SpO2 93 %  Assess: MEWS Score  MEWS Temp 2  MEWS Systolic 0  MEWS Pulse 0  MEWS RR 0  MEWS LOC 0  MEWS Score 2  MEWS Score Color Yellow  Assess: if the MEWS score is Yellow or Red  Were vital signs taken at a resting state? Yes  Focused Assessment No change from prior assessment  Does the patient meet 2 or more of the SIRS criteria? Yes  Does the patient have a confirmed or suspected source of infection? Yes  Provider and Rapid Response Notified? Yes (MD notified, RR RN n/a)  MEWS guidelines implemented  Yes, yellow  Treat  MEWS Interventions Considered administering scheduled or prn medications/treatments as ordered  Take Vital Signs  Increase Vital Sign Frequency  Yellow: Q2hr x1, continue Q4hrs until patient remains green for 12hrs  Escalate  MEWS: Escalate Yellow: Discuss with charge nurse and consider notifying provider and/or RRT  Notify: Charge Nurse/RN  Name of Charge Nurse/RN Notified [facility]  Provider Notification  Provider Name/Title Angelique Blonder, DO  Date Provider Notified 06/29/22  Time Provider Notified 2323  Method of Notification Page (secure chat)  Notification Reason Change in status;Other (Comment) (fever)  Provider response See new orders  Date of Provider Response 06/30/22  Time of Provider Response 2330  Notify: Rapid Response  Name of Rapid Response RN Notified N/A  Assess: SIRS CRITERIA  SIRS Temperature  1  SIRS Pulse 1  SIRS Respirations  0  SIRS WBC 1  SIRS Score Sum  3

## 2022-07-01 ENCOUNTER — Inpatient Hospital Stay (HOSPITAL_COMMUNITY): Payer: Medicare PPO

## 2022-07-01 LAB — RENAL FUNCTION PANEL
Albumin: 2.4 g/dL — ABNORMAL LOW (ref 3.5–5.0)
Anion gap: 13 (ref 5–15)
BUN: 27 mg/dL — ABNORMAL HIGH (ref 8–23)
CO2: 21 mmol/L — ABNORMAL LOW (ref 22–32)
Calcium: 9 mg/dL (ref 8.9–10.3)
Chloride: 103 mmol/L (ref 98–111)
Creatinine, Ser: 1.55 mg/dL — ABNORMAL HIGH (ref 0.44–1.00)
GFR, Estimated: 36 mL/min — ABNORMAL LOW (ref >60)
Glucose, Bld: 135 mg/dL — ABNORMAL HIGH (ref 70–99)
Phosphorus: 1.7 mg/dL — ABNORMAL LOW (ref 2.5–4.6)
Potassium: 3.2 mmol/L — ABNORMAL LOW (ref 3.5–5.1)
Sodium: 137 mmol/L (ref 135–145)

## 2022-07-01 LAB — HEMOGLOBIN A1C
Hgb A1c MFr Bld: 8.3 % — ABNORMAL HIGH (ref 4.8–5.6)
Mean Plasma Glucose: 192 mg/dL

## 2022-07-01 LAB — CBC
HCT: 32.5 % — ABNORMAL LOW (ref 36.0–46.0)
Hemoglobin: 10 g/dL — ABNORMAL LOW (ref 12.0–15.0)
MCH: 27.2 pg (ref 26.0–34.0)
MCHC: 30.8 g/dL (ref 30.0–36.0)
MCV: 88.6 fL (ref 80.0–100.0)
Platelets: 115 10*3/uL — ABNORMAL LOW (ref 150–400)
RBC: 3.67 MIL/uL — ABNORMAL LOW (ref 3.87–5.11)
RDW: 14.7 % (ref 11.5–15.5)
WBC: 4.1 10*3/uL (ref 4.0–10.5)
nRBC: 0 % (ref 0.0–0.2)

## 2022-07-01 LAB — GLUCOSE, CAPILLARY
Glucose-Capillary: 149 mg/dL — ABNORMAL HIGH (ref 70–99)
Glucose-Capillary: 129 mg/dL — ABNORMAL HIGH (ref 70–99)
Glucose-Capillary: 176 mg/dL — ABNORMAL HIGH (ref 70–99)
Glucose-Capillary: 192 mg/dL — ABNORMAL HIGH (ref 70–99)
Glucose-Capillary: 145 mg/dL — ABNORMAL HIGH (ref 70–99)

## 2022-07-01 LAB — MAGNESIUM: Magnesium: 1.9 mg/dL (ref 1.7–2.4)

## 2022-07-01 MED ORDER — POTASSIUM CHLORIDE CRYS ER 20 MEQ PO TBCR
40.0000 meq | EXTENDED_RELEASE_TABLET | Freq: Two times a day (BID) | ORAL | Status: AC
  Administered 2022-07-01 (×2): 40 meq via ORAL
  Filled 2022-07-01 (×2): qty 2

## 2022-07-01 MED ORDER — POTASSIUM PHOSPHATES 15 MMOLE/5ML IV SOLN
30.0000 mmol | Freq: Once | INTRAVENOUS | Status: AC
  Administered 2022-07-01: 30 mmol via INTRAVENOUS
  Filled 2022-07-01: qty 10

## 2022-07-01 MED ORDER — SODIUM CHLORIDE 0.9 % IV SOLN
500.0000 mg | INTRAVENOUS | Status: DC
  Administered 2022-07-01: 500 mg via INTRAVENOUS
  Filled 2022-07-01 (×2): qty 5

## 2022-07-01 MED ORDER — LOPERAMIDE HCL 2 MG PO CAPS
2.0000 mg | ORAL_CAPSULE | Freq: Once | ORAL | Status: AC
  Administered 2022-07-01: 2 mg via ORAL
  Filled 2022-07-01: qty 1

## 2022-07-01 MED ORDER — GERHARDT'S BUTT CREAM
TOPICAL_CREAM | Freq: Every day | CUTANEOUS | Status: DC
  Administered 2022-07-01: 1 via TOPICAL
  Filled 2022-07-01: qty 1

## 2022-07-01 MED ORDER — LACTATED RINGERS IV SOLN: INTRAVENOUS | Status: AC

## 2022-07-01 MED ORDER — POTASSIUM CHLORIDE 10 MEQ/100ML IV SOLN: 10.0000 meq | INTRAVENOUS | Status: DC

## 2022-07-01 NOTE — Progress Notes (Addendum)
[facility] NEPHROLOGY PROGRESS NOTE  Assessment/ Plan: Pt is a 72 y.o. yo female  with history of hypertension, diabetes, legally blind, ESRD on HD for about 5 years until she received deceased donor kidney transplant in 2019 at [facility], presented with nausea vomiting, decreased oral intake in the setting of sepsis, seen as a consultation for the evaluation and management of acute kidney injury.   # Acute kidney injury in transplanted kidney presumably prerenal with GI loss, decreased oral intake concomitant with sepsis/UTI and mild nontraumatic rhabdomyolysis.  Treated with IV fluid and broad-spectrum antibiotics.  Urine output is not accurately measured.  The creatinine level is trending down.  US Doppler transplant kidney normal.  Continue IV fluid and expect renal recovery to her baseline.  Avoid nephrotoxins and hypotensive episodes.  # DDRT in 2019 at [facility]: She is on Benlysta every 28 days and on sirolimus.  We will continue sirolimus here.  Transplant kidney normal in ultrasound.  She will follow-up with her nephrologist after discharge from the hospital.  # Metabolic acidosis in the setting of AKI: Improved with IV sodium bicarbonate, DC now.  Follow lab.  # Sepsis, exact etiology unclear, presumably viral gastroenteritis, UTI: On antibiotics per primary team.  Follow cultures.  # Hypokalemia: Replete potassium chloride.  Monitor lab.  Plan as above. Sign of, please call us back with question.  Discussed with the patient and her husband.  Subjective: Seen and examined at bedside.  Feeling weak, tired.  Denies nausea and vomiting this morning.  No chest pain or shortness of breath.  Her husband was present at the bedside. Objective Vital signs in last 24 hours: Vitals:   06/30/22 2331 07/01/22 0233 07/01/22 0625 07/01/22 0754  BP: (!) 153/44 (!) 155/116 114/61 (!) 130/104  Pulse: (!) 106 70 (!) 110 90  Resp: '17 20 18 '$ (!) 22  Temp: 100.1 F (37.8 C) 99.1 F  (37.3 C)  98.3 F (36.8 C)  TempSrc: Oral Oral Oral Oral  SpO2: 93% 92% 92% 95%  Weight:  76.8 kg    Height:       Weight change: 4.7 kg  Intake/Output Summary (Last 24 hours) at 07/01/2022 1007 Last data filed at 07/01/2022 [id number] Gross per 24 hour  Intake 5617.99 ml  Output 100 ml  Net 5517.99 ml       Labs: RENAL PANEL Recent Labs  Lab 06/29/22 1319 06/29/22 2056 06/30/22 0827 07/01/22 0158  NA 141 139 139 137  K 4.3 3.7 3.5 3.2*  CL 107 108 109 103  CO2 15* 17* 15* 21*  GLUCOSE 142* 121* 151* 135*  BUN 31* 34* 34* 27*  CREATININE 1.62* 1.70* 1.84* 1.55*  CALCIUM 9.7 9.2 9.1 9.0  PHOS  --  3.7 3.7 1.7*  ALBUMIN 3.7 3.2* 2.9* 2.4*    Liver Function Tests: Recent Labs  Lab 06/29/22 1319 06/29/22 2056 06/30/22 0827 07/01/22 0158  AST 50*  --   --   --   ALT 19  --   --   --   ALKPHOS 81  --   --   --   BILITOT 0.8  --   --   --   PROT 7.6  --   --   --   ALBUMIN 3.7 3.2* 2.9* 2.4*   Recent Labs  Lab 06/29/22 1319  LIPASE 23   No results for input(s): "AMMONIA" in the last 168 hours. CBC: Recent Labs    05/26/22 1008 06/23/22 1010 06/29/22 1319  06/30/22 0827 07/01/22 0158  HGB 12.3 12.2 13.5 11.3* 10.0*  MCV 91.2 88.7 89.7 90.4 88.6    Cardiac Enzymes: Recent Labs  Lab 06/29/22 2056 06/30/22 0827 06/30/22 1812  CKTOTAL 2,935* 3,047* 2,166*   CBG: Recent Labs  Lab 06/30/22 1157 06/30/22 1637 06/30/22 2120 07/01/22 0629 07/01/22 0726  GLUCAP 210* 152* 129* 149* 129*    Iron Studies: No results for input(s): "IRON", "TIBC", "TRANSFERRIN", "FERRITIN" in the last 72 hours. Studies/Results: US Renal Transplant w/Doppler  Result Date: 06/30/2022 CLINICAL DATA:  Acute kidney injury EXAM: ULTRASOUND OF RENAL TRANSPLANT WITH RENAL DOPPLER ULTRASOUND TECHNIQUE: Ultrasound examination of the renal transplant was performed with gray-scale, color and duplex doppler evaluation. COMPARISON:  None Available. FINDINGS: Transplant kidney location:  RLQ Transplant Kidney: Renal measurements: 10.5 x 6.6 x 7.2 cm = volume: 239m. Normal in size and parenchymal echogenicity. No evidence of mass or hydronephrosis. No peri-transplant fluid collection seen. Color flow in the main renal artery:  Yes Color flow in the main renal vein:  Yes Duplex Doppler Evaluation: Main Renal Artery Velocity: 86 cm/sec Main Renal Artery Resistive Index: 0.8 Venous waveform in main renal vein:  Present Intrarenal resistive index in upper pole:  0.8 (normal 0.6-0.8; equivocal 0.8-0.9; abnormal >= 0.9) Intrarenal resistive index in lower pole: 0.76 (normal 0.6-0.8; equivocal 0.8-0.9; abnormal >= 0.9) Bladder: Normal for degree of bladder distention. Ureteral jets are not seen. Other findings: Small amount of free fluid in the right lower quadrant. IMPRESSION: 1. Normal sonographic appearance of the transplant kidney. 2. Small amount of free fluid in the right lower quadrant. Electronically Signed   By: ARonney AstersM.D.   On: 06/30/2022 16:48   DG Chest Port 1 View  Result Date: 06/29/2022 CLINICAL DATA:  Questionable sepsis - evaluate for abnormality EXAM: PORTABLE CHEST - 1 VIEW COMPARISON:  11/29/2021 FINDINGS: Cardiac silhouette is unremarkable. No pneumothorax or pleural effusion. The lungs are clear. Aorta is calcified. There are thoracic degenerative changes. IMPRESSION: No acute cardiopulmonary process. Electronically Signed   By: JSammie BenchM.D.   On: 06/29/2022 15:08   CT Head Wo Contrast  Result Date: 06/29/2022 CLINICAL DATA:  Patient complains of nausea, vomiting, headache and intermittent aphasia with slurred speech. EXAM: CT HEAD WITHOUT CONTRAST TECHNIQUE: Contiguous axial images were obtained from the base of the skull through the vertex without intravenous contrast. RADIATION DOSE REDUCTION: This exam was performed according to the departmental dose-optimization program which includes automated exposure control, adjustment of the mA and/or kV according to  patient size and/or use of iterative reconstruction technique. COMPARISON:  None Available. FINDINGS: Brain: No evidence of acute infarction, hemorrhage, hydrocephalus, extra-axial collection or mass lesion/mass effect. There is mild patchy low-attenuation within the subcortical and periventricular white matter compatible with chronic microvascular disease. Vascular: No hyperdense vessel or unexpected calcification. Skull: Normal. Negative for fracture or focal lesion. Sinuses/Orbits: Right lobe appears normal. The left globe is dense with peripheral dystrophic calcifications. Paranasal sinuses and mastoid air cells are clear. Other: None IMPRESSION: 1. No acute intracranial abnormalities. 2. Mild chronic microvascular change Electronically Signed   By: TKerby MoorsM.D.   On: 06/29/2022 11:18    Medications: Infusions:  ceFEPime (MAXIPIME) IV Stopped (07/01/22 0548)   potassium PHOSPHATE IVPB (in mmol) 30 mmol (07/01/22 0837)   sodium bicarbonate 150 mEq in sterile water 1,150 mL infusion 150 mL/hr at 07/01/22 0700    Scheduled Medications:  heparin  5,000 Units Subcutaneous Q8H   insulin aspart  0-5 Units  Subcutaneous QHS   insulin aspart  0-9 Units Subcutaneous TID WC   levothyroxine  62.5 mcg Oral QODAY   potassium chloride  40 mEq Oral BID   Sirolimus  2 mg Oral Q1200    have reviewed scheduled and prn medications.  Physical Exam: General:NAD, comfortable Heart:RRR, s1s2 nl Lungs:clear b/l, no crackle Abdomen:soft, Non-tender, non-distended Extremities:No edema Neurology: Alert, awake and following commands  Ritvik Mczeal Tanna Furry 07/01/2022,10:07 AM  LOS: 1 day

## 2022-07-01 NOTE — Progress Notes (Signed)
Pt heart rate has been raised to the 140's occasionally. Patient is asymptomatic. MD on call notified.

## 2022-07-01 NOTE — Progress Notes (Signed)
HD#1 Subjective:   Summary: Denise Bush is a 72 y.o. female with pertinent PMH of ESRD s/p DDKT 01/02/2018 on chronic immunosuppression with sirolimus and belatacept infusions, T2DM, HTN, hypothyroidism, and proliferative diabetic retinopathy who presents with 2 days of nausea, vomiting, and diarrhea with intermittent confusion and fevers and is admitted for sepsis with urinary tract infection and AKI.  Overnight Events: Pt has an episode of vomiting after trying to take oral tylenol.   No worsening.  Continues to have diarrhea.  Compazine worked well to control nausea.  Will to tolerate some pills.  Not having any pain.  Did spike a fever last night and felt warm to husband this morning.  Objective:  Vital signs in last 24 hours: Vitals:   07/01/22 0233 07/01/22 0625 07/01/22 0754 07/01/22 1217  BP: (!) 155/116 114/61 (!) 130/104 (!) 122/54  Pulse: 70 (!) 110 90 87  Resp: 20 18 (!) 22 16  Temp: 99.1 F (37.3 C)  98.3 F (36.8 C) 99.4 F (37.4 C)  TempSrc: Oral Oral Oral Oral  SpO2: 92% 92% 95% 95%  Weight: 76.8 kg     Height:       Supplemental O2: Room Air SpO2: 95 % O2 Flow Rate (L/min): 2 L/min   Physical Exam:  Constitutional: Slightly uncomfortable elderly female sitting in bedside chair. In no acute distress. Cardio:Regular rate and rhythm. 2+ bilateral radial pulses.  Palpable thrill over LUE AVF. Pulm:Clear to auscultation bilaterally. Normal work of breathing on room air. Abdomen: Soft, non-tender, non-distended, positive bowel sounds. [id number] for extremity edema. Skin: Warm and dry.   Neuro:Alert and oriented x3. No focal deficit noted.  Filed Weights   06/29/22 1038 06/29/22 1913 07/01/22 0233  Weight: 72.1 kg 74.2 kg 76.8 kg      Intake/Output Summary (Last 24 hours) at 07/01/2022 1626 Last data filed at 07/01/2022 1300 Gross per 24 hour  Intake 4813.47 ml  Output 250 ml  Net 4563.47 ml   Net IO Since Admission: 9,472.83 mL [07/01/22  1626]  Pertinent Labs:    Latest Ref Rng & Units 07/01/2022    1:58 AM 06/30/2022    8:27 AM 06/29/2022    1:19 PM  CBC  WBC 4.0 - 10.5 K/uL 4.1  4.0  4.1   Hemoglobin 12.0 - 15.0 g/dL 10.0  11.3  13.5   Hematocrit 36.0 - 46.0 % 32.5  36.8  43.6   Platelets 150 - 400 K/uL 115  121  142        Latest Ref Rng & Units 07/01/2022    1:58 AM 06/30/2022    8:27 AM 06/29/2022    8:56 PM  CMP  Glucose 70 - 99 mg/dL 135  151  121   BUN 8 - 23 mg/dL 27  34  34   Creatinine 0.44 - 1.00 mg/dL 1.55  1.84  1.70   Sodium 135 - 145 mmol/L 137  139  139   Potassium 3.5 - 5.1 mmol/L 3.2  3.5  3.7   Chloride 98 - 111 mmol/L 103  109  108   CO2 22 - 32 mmol/L '21  15  17   '$ Calcium 8.9 - 10.3 mg/dL 9.0  9.1  9.2     Assessment/Plan:   Principal Problem:   Sepsis ([place]) Active Problems:   Hypothyroidism   Essential hypertension   Type 2 diabetes mellitus treated with insulin ([place])   Kidney replaced by transplant   Urinary tract infection  AKI (acute kidney injury) ([place])   Nausea, vomiting, and diarrhea   High anion gap metabolic acidosis   Patient Summary: Denise Bush is a 72 y.o. female with pertinent PMH of ESRD s/p DDKT 01/02/2018 on chronic immunosuppression with sirolimus and belatacept infusions, T2DM, HTN, hypothyroidism, and proliferative diabetic retinopathy who presents with 2 days of nausea, vomiting, and diarrhea with intermittent confusion and fevers and is admitted for sepsis with urinary tract infection and AKI, on hospital day 1   Sepsis secondary to UTI versus gastroenteritis. Acute encephalopathy in setting of fevers Antibiotics were narrowed started to cefepime to treat for presumed complicated UTI with systemic signs including fever, nausea, vomiting, diarrhea.  However patient has continued to fever with temps up to 103 with ongoing diarrhea.  We will reexpand our coverage to treat for possible gastroenteritis.  We will check CT abdomen pelvis to evaluate for possible  intra-abdominal sources.  If colitis is seen will consult GI due to concern for possible CMV colitis.  She has been fluid resuscitated but will need maintenance fluids as she is still having difficulty keeping p.o.  GI panel negative.  Blood culture and no growth to date.  Urine culture in process. - Continue cefepime day 3 -Start azithromycin 500 daily for 3 days - Continue to monitor fever curve, Tylenol as needed for fever - Monitor blood and urine cultures - Morning CBC and renal function panel  AKI Nontraumatic rhabdomyolysis Anion gap metabolic acidosis Proteinuria AKI shows improvement after significant volume resuscitation.  Likely prerenal in etiology.  Renal ultrasound was reassuring.  Sodium bicarb fluids stopped today. - Daily renal function panel  ESRD s/p renal transplant 2019 on chronic immunosuppression Baseline creatinine of 1. S/p deceased donor kidney transplant on 01/02/2018. 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  History of BK nephropathy with consistently low viral levels while on belatacept.  She follows with [facility] for her renal transplant and was last seen on 03/01/2022.  We reached out to their office to make sure that they were aware of her admission.  On chronic immunosuppression with belatacept and sirolimus (was on mycophenolate from 12/2021 to 03/2022).   - sirolimus 2 mg daily - Appreciate nephrology's evaluation and management   Nausea, vomiting, and diarrhea Seems to be a viral gastroenteritis that has caused ongoing volume depletion.  C. difficile and GI pathogen panel negative.  If he continues to have the symptoms we will consult GI for possible endoscopic evaluation with her history of renal transplant and immunosuppression. - IV fluids and IV prochlorperazine 5 mg every 6 hours as needed for nausea - Encourage p.o. intake as tolerated   Thrombocytopenia Dilutional. No bleeding.     Type 2 diabetes Last A1c was  7.3.  Lantus 23 units daily at home with sliding scale insulin. - sensitive SSI - A1c   Hypertension We will hold her home medications of amlodipine and carvedilol in the setting of low blood pressures.  If remaining tachycardic and pressures are holding stable or elevated we will start back carvedilol.   Hypothyroidism Resume home levothyroxine 62.5 mcg every other day.  Diet: Carb-Modified IVF: lr VTE: Heparin Code: DNR Dispo: Pending improvement in renal function and further workup of acute kidney injury.  Delene Ruffini, MD

## 2022-07-01 NOTE — Progress Notes (Signed)
MEWS Progress Note  Patient Details Name: Denise Bush MRN: [id number] DOB: 08-15-1950 Today's Date: 07/01/2022   MEWS Flowsheet Documentation:  Assess: MEWS Score Temp: 99.1 F (37.3 C) BP: 114/61 MAP (mmHg): 75 Pulse Rate: (!) 110 ECG Heart Rate: 98 Resp: 18 Level of Consciousness: Alert SpO2: 92 % O2 Device: Nasal Cannula O2 Flow Rate (L/min): 2 L/min Assess: MEWS Score MEWS Temp: 0 MEWS Systolic: 0 MEWS Pulse: 1 MEWS RR: 0 MEWS LOC: 0 MEWS Score: 1 MEWS Score Color: Green Assess: SIRS CRITERIA SIRS Temperature : 0 SIRS Respirations : 0 SIRS Pulse: 1 SIRS WBC: 0 SIRS Score Sum : 1 SIRS Temperature : 0 SIRS Pulse: 1 SIRS Respirations : 0 SIRS WBC: 0 SIRS Score Sum : 1 Assess: if the MEWS score is Yellow or Red Were vital signs taken at a resting state?: Yes Focused Assessment: Change from prior assessment (see assessment flowsheet) Does the patient meet 2 or more of the SIRS criteria?: Yes Does the patient have a confirmed or suspected source of infection?: Yes Provider and Rapid Response Notified?: Yes MEWS guidelines implemented : Yes, yellow Treat MEWS Interventions: Considered administering scheduled or prn medications/treatments as ordered Take Vital Signs Increase Vital Sign Frequency : Yellow: Q2hr x1, continue Q4hrs until patient remains green for 12hrs Escalate MEWS: Escalate: Yellow: Discuss with charge nurse and consider notifying provider and/or RRT Notify: Charge Nurse/RN Name of Charge Nurse/RN Notified: Maudie Mercury, RN Provider Notification Provider Name/Title: Markus Jarvis Date Provider Notified: 07/01/22 Time Provider Notified: 0620 Method of Notification: Page Notification Reason: Change in status Provider response: Other (Comment) Date of Provider Response: 07/01/22 Time of Provider Response: [phone] Notify: Rapid Response Name of Rapid Response RN Notified: N/A      Avel Sensor 07/01/2022, 6:41 AM

## 2022-07-02 DIAGNOSIS — B9620 Unspecified Escherichia coli [E. coli] as the cause of diseases classified elsewhere: Secondary | ICD-10-CM

## 2022-07-02 DIAGNOSIS — K529 Noninfective gastroenteritis and colitis, unspecified: Secondary | ICD-10-CM

## 2022-07-02 DIAGNOSIS — N390 Urinary tract infection, site not specified: Secondary | ICD-10-CM | POA: Diagnosis not present

## 2022-07-02 DIAGNOSIS — R7881 Bacteremia: Secondary | ICD-10-CM | POA: Diagnosis not present

## 2022-07-02 LAB — BLOOD CULTURE ID PANEL (REFLEXED) - BCID2

## 2022-07-02 LAB — GLUCOSE, CAPILLARY
Glucose-Capillary: 144 mg/dL — ABNORMAL HIGH (ref 70–99)
Glucose-Capillary: 129 mg/dL — ABNORMAL HIGH (ref 70–99)
Glucose-Capillary: 108 mg/dL — ABNORMAL HIGH (ref 70–99)
Glucose-Capillary: 148 mg/dL — ABNORMAL HIGH (ref 70–99)

## 2022-07-02 LAB — COMPREHENSIVE METABOLIC PANEL
ALT: 24 U/L (ref 0–44)
AST: 45 U/L — ABNORMAL HIGH (ref 15–41)
Albumin: 2.2 g/dL — ABNORMAL LOW (ref 3.5–5.0)
Alkaline Phosphatase: 46 U/L (ref 38–126)
Anion gap: 6 (ref 5–15)
BUN: 25 mg/dL — ABNORMAL HIGH (ref 8–23)
CO2: 26 mmol/L (ref 22–32)
Calcium: 8.6 mg/dL — ABNORMAL LOW (ref 8.9–10.3)
Chloride: 103 mmol/L (ref 98–111)
Creatinine, Ser: 1.38 mg/dL — ABNORMAL HIGH (ref 0.44–1.00)
GFR, Estimated: 41 mL/min — ABNORMAL LOW (ref >60)
Glucose, Bld: 159 mg/dL — ABNORMAL HIGH (ref 70–99)
Potassium: 3.9 mmol/L (ref 3.5–5.1)
Sodium: 135 mmol/L (ref 135–145)
Total Bilirubin: 0.4 mg/dL (ref 0.3–1.2)
Total Protein: 5.6 g/dL — ABNORMAL LOW (ref 6.5–8.1)

## 2022-07-02 LAB — CBC
HCT: 29.4 % — ABNORMAL LOW (ref 36.0–46.0)
Hemoglobin: 9.2 g/dL — ABNORMAL LOW (ref 12.0–15.0)
MCH: 27.5 pg (ref 26.0–34.0)
MCHC: 31.3 g/dL (ref 30.0–36.0)
MCV: 88 fL (ref 80.0–100.0)
Platelets: 118 10*3/uL — ABNORMAL LOW (ref 150–400)
RBC: 3.34 MIL/uL — ABNORMAL LOW (ref 3.87–5.11)
RDW: 14.7 % (ref 11.5–15.5)
WBC: 2.8 10*3/uL — ABNORMAL LOW (ref 4.0–10.5)
nRBC: 0 % (ref 0.0–0.2)

## 2022-07-02 LAB — MAGNESIUM: Magnesium: 2 mg/dL (ref 1.7–2.4)

## 2022-07-02 LAB — PHOSPHORUS: Phosphorus: 1.8 mg/dL — ABNORMAL LOW (ref 2.5–4.6)

## 2022-07-02 MED ORDER — SODIUM CHLORIDE 0.9 % IV SOLN
2.0000 g | Freq: Every day | INTRAVENOUS | Status: DC
  Administered 2022-07-02 – 2022-07-04 (×3): 2 g via INTRAVENOUS
  Filled 2022-07-02 (×4): qty 20

## 2022-07-02 MED ORDER — POTASSIUM PHOSPHATES 15 MMOLE/5ML IV SOLN
30.0000 mmol | Freq: Once | INTRAVENOUS | Status: DC
  Administered 2022-07-02: 30 mmol via INTRAVENOUS
  Filled 2022-07-02: qty 10

## 2022-07-02 MED ORDER — K PHOS MONO-SOD PHOS DI & MONO 155-852-130 MG PO TABS
500.0000 mg | ORAL_TABLET | Freq: Once | ORAL | Status: AC
  Administered 2022-07-02: 500 mg via ORAL
  Filled 2022-07-02: qty 2

## 2022-07-02 MED ORDER — AZITHROMYCIN 500 MG PO TABS: 500.0000 mg | ORAL_TABLET | Freq: Every day | ORAL | Status: DC

## 2022-07-02 MED ORDER — SODIUM CHLORIDE 0.9 % IV SOLN
500.0000 mg | INTRAVENOUS | Status: DC
  Administered 2022-07-02: 500 mg via INTRAVENOUS
  Filled 2022-07-02: qty 5

## 2022-07-02 MED ORDER — AZITHROMYCIN 500 MG PO TABS
500.0000 mg | ORAL_TABLET | Freq: Every day | ORAL | Status: DC
  Administered 2022-07-03: 500 mg via ORAL
  Filled 2022-07-02: qty 1

## 2022-07-02 MED ORDER — LACTATED RINGERS IV BOLUS
1000.0000 mL | Freq: Once | INTRAVENOUS | Status: AC
  Administered 2022-07-02: 1000 mL via INTRAVENOUS

## 2022-07-02 MED ORDER — LACTATED RINGERS IV SOLN: INTRAVENOUS | Status: AC

## 2022-07-02 NOTE — Progress Notes (Signed)
Patient has fistula present on Left Upper arm. Not currently using due to renal transplant per pt.

## 2022-07-02 NOTE — Consult Note (Signed)
CONSULT NOTE FOR [facility]  Reason for Consult: Diarrhea and colitis Referring Physician: Teaching Service  Sheral Apley HPI: This is a 72 year old female s/p renal transplant (2019) secondary to DM and HTN, hyperlipidemia, and diabetic retinopathy admitted for diarrhea.  Her diarrhea started acutely Tuesday evening and it continued to progress along with significant nausea.  She had multiple bowel movements and she was not able to make it to the restroom at times.  Her symptoms continued to worsen and she presented to the ER for further evaluation.  Her admission creatinine was at 1.7-1.8 with a baseline of .8-.9.  The patient denied any sick contacts or any adjustments with her immunosuppressants.  The GI pathogen panel and C diff stool studies were normal.  A CT scan of the abdomen was performed and it showed a cecal colitis.  Her last colonoscopy was with Dr. Loletha Carrow on 07/2015 and it was a normal screening examination.  With treatment her diarrhea improved starting yesterday.  She only has two bowel movements, but they were still watery.  The volume declined and she reports feeling better.  CTX was started for a suspected UTI.    Past Medical History:  Diagnosis Date   Allergy    Arthritis    Blood transfusion without reported diagnosis    Cataract    bilateral   Diabetic retinopathy ([place])    End stage renal disease on dialysis due to type 2 diabetes mellitus ([place]) 03/22/2015   ESRD (end stage renal disease) ([place])    Hashimoto's disease    Hyperlipidemia    Hyperparathyroidism, secondary renal ([place]) 06/25/2015   Hypertension    Hypothyroid    Loss of vision    both eyes, no vision left eye and little in left     Past Surgical History:  Procedure Laterality Date   ABDOMINAL HYSTERECTOMY     BREAST BIOPSY Right 2015   benign   COLONOSCOPY     EYE SURGERY     KNEE SURGERY     TOTAL KNEE ARTHROPLASTY Right 01/24/2022   Procedure: RIGHT TOTAL KNEE ARTHROPLASTY;  Surgeon: Melrose Nakayama, MD;  Location: [facility];  Service: Orthopedics;  Laterality: Right;    Family History  Problem Relation Age of Onset   Breast cancer Sister    CAD Other        No family history   Colon polyps Maternal Aunt    Breast cancer Maternal Aunt    Colon polyps Maternal Uncle    Breast cancer Maternal Grandmother    Breast cancer Maternal Aunt    Colon cancer Neg Hx     Social History:  reports that she has never smoked. She has never used smokeless tobacco. She reports that she does not drink alcohol and does not use drugs.  Allergies:  Allergies  Allergen Reactions   Hydralazine Shortness Of Breath    Chest pain Fatigue    Azor [Amlodipine-Olmesartan] Other (See Comments)    Sharp chest pain     Benadryl [Diphenhydramine] Other (See Comments)    Unknown reaction   Camellia Other (See Comments)    Unknown reaction   Denaverine Other (See Comments)    Unknown reaction   Dyazide [Hydrochlorothiazide W-Triamterene] Swelling and Other (See Comments)    Chest pain Throat swelling   Glucophage [Metformin] Diarrhea, Itching and Nausea Only    Bowel incontinence    Hydrochlorothiazide Other (See Comments)    "contractions in throat"    Laureth Other (  See Comments)    Unknown reaction   Prednisone Other (See Comments)    Elevated Glucose    Reglan [Metoclopramide] Other (See Comments)    Worsening acid reflux   Trandate [Labetalol] Other (See Comments)    Foot pain   Zestril [Lisinopril] Other (See Comments)    Severe radiating foot pain.   Aldactone [Spironolactone] Rash and Other (See Comments)    Severe radiating pain in feet    Rogaine [Minoxidil] Rash and Other (See Comments)    Fatigue  Choking, throat contractions   Statins Rash    Fatigue    Medications: Scheduled:  Gerhardt's butt cream   Topical Daily   heparin  5,000 Units Subcutaneous Q8H   insulin aspart  0-5 Units Subcutaneous QHS   insulin aspart  0-9 Units Subcutaneous TID WC   levothyroxine   62.5 mcg Oral QODAY   Sirolimus  2 mg Oral Q1200   Continuous:  azithromycin (ZITHROMAX) 500 mg in sodium chloride 0.9 % 250 mL IVPB 100 mL/hr at 07/02/22 1245   cefTRIAXone (ROCEPHIN)  IV 2 g (07/02/22 0549)   lactated ringers 100 mL/hr at 07/02/22 0839   potassium PHOSPHATE IVPB (in mmol) 30 mmol (07/02/22 1347)    Results for orders placed or performed during the hospital encounter of 06/29/22 (from the past 24 hour(s))  Glucose, capillary     Status: Abnormal   Collection Time: 07/01/22  4:10 PM  Result Value Ref Range   Glucose-Capillary 192 (H) 70 - 99 mg/dL  Glucose, capillary     Status: Abnormal   Collection Time: 07/01/22  8:19 PM  Result Value Ref Range   Glucose-Capillary 145 (H) 70 - 99 mg/dL  Comprehensive metabolic panel     Status: Abnormal   Collection Time: 07/02/22  7:20 AM  Result Value Ref Range   Sodium 135 135 - 145 mmol/L   Potassium 3.9 3.5 - 5.1 mmol/L   Chloride 103 98 - 111 mmol/L   CO2 26 22 - 32 mmol/L   Glucose, Bld 159 (H) 70 - 99 mg/dL   BUN 25 (H) 8 - 23 mg/dL   Creatinine, Ser 1.38 (H) 0.44 - 1.00 mg/dL   Calcium 8.6 (L) 8.9 - 10.3 mg/dL   Total Protein 5.6 (L) 6.5 - 8.1 g/dL   Albumin 2.2 (L) 3.5 - 5.0 g/dL   AST 45 (H) 15 - 41 U/L   ALT 24 0 - 44 U/L   Alkaline Phosphatase 46 38 - 126 U/L   Total Bilirubin 0.4 0.3 - 1.2 mg/dL   GFR, Estimated 41 (L) >60 mL/min   Anion gap 6 5 - 15  Magnesium     Status: None   Collection Time: 07/02/22  7:20 AM  Result Value Ref Range   Magnesium 2.0 1.7 - 2.4 mg/dL  Phosphorus     Status: Abnormal   Collection Time: 07/02/22  7:20 AM  Result Value Ref Range   Phosphorus 1.8 (L) 2.5 - 4.6 mg/dL  CBC     Status: Abnormal   Collection Time: 07/02/22  7:20 AM  Result Value Ref Range   WBC 2.8 (L) 4.0 - 10.5 K/uL   RBC 3.34 (L) 3.87 - 5.11 MIL/uL   Hemoglobin 9.2 (L) 12.0 - 15.0 g/dL   HCT 29.4 (L) 36.0 - 46.0 %   MCV 88.0 80.0 - 100.0 fL   MCH 27.5 26.0 - 34.0 pg   MCHC 31.3 30.0 - 36.0 g/dL    RDW 14.7 11.5 -  15.5 %   Platelets 118 (L) 150 - 400 K/uL   nRBC 0.0 0.0 - 0.2 %  Glucose, capillary     Status: Abnormal   Collection Time: 07/02/22  7:31 AM  Result Value Ref Range   Glucose-Capillary 144 (H) 70 - 99 mg/dL  Glucose, capillary     Status: Abnormal   Collection Time: 07/02/22 11:38 AM  Result Value Ref Range   Glucose-Capillary 129 (H) 70 - 99 mg/dL     CT ABDOMEN PELVIS WO CONTRAST  Result Date: 07/01/2022 CLINICAL DATA:  Diarrhea. EXAM: CT ABDOMEN AND PELVIS WITHOUT CONTRAST TECHNIQUE: Multidetector CT imaging of the abdomen and pelvis was performed following the standard protocol without IV contrast. RADIATION DOSE REDUCTION: This exam was performed according to the departmental dose-optimization program which includes automated exposure control, adjustment of the mA and/or kV according to patient size and/or use of iterative reconstruction technique. COMPARISON:  07/07/2015. FINDINGS: Lower chest: The heart is enlarged and there is a trace pericardial effusion. There is calcification of the mitral valve annulus. Multi-vessel coronary artery calcifications are noted. There are small bilateral pleural effusions with mild atelectasis or scarring at the lung bases. Hepatobiliary: No focal liver abnormality is seen. No gallstones, gallbladder wall thickening, or biliary dilatation. Pancreas: Unremarkable. No pancreatic ductal dilatation or surrounding inflammatory changes. Spleen: Normal in size without focal abnormality. Adrenals/Urinary Tract: The adrenal glands are within normal limits. Renal atrophy and cortical thinning are noted bilaterally in the native kidney. Calcifications are present at the renal hila bilaterally, possible renal calculi versus vascular calcifications. No hydronephrosis. The bladder is unremarkable. A transplant kidney is noted in the right iliac fossa with mild surrounding fat stranding. No renal calculus or hydronephrosis at the renal transplant.  Stomach/Bowel: Stomach is within normal limits. Appendix appears normal. No bowel obstruction, free air or pneumatosis. Air-fluid levels are noted throughout the colon, compatible with history of diarrheal illness. There is mild bowel wall thickening involving the cecum and ascending colon. Vascular/Lymphatic: Aortic atherosclerosis. No enlarged abdominal or pelvic lymph nodes. Reproductive: Status post hysterectomy. No adnexal masses. Other: Mild mesenteric fat stranding with a small amount of free fluid in the right pericolic gutter and pelvis. Small fat containing umbilical hernia. Anasarca is noted. Musculoskeletal: Degenerative changes are present in the thoracolumbar spine. No acute osseous abnormality. IMPRESSION: 1. Air-fluid levels in the colon, compatible with history of diarrhea. 2. Mild bowel wall thickening involving the cecum and ascending colon, possible colitis. 3. Right renal fossa transplant kidney. Perinephric fat stranding is noted in the region which may be infectious or inflammatory. 4. Atrophy and cortical thinning involving the native kidneys. Possible renal calculi versus vascular calcifications. 5. Small bilateral pleural effusions with atelectasis at the lung bases. 6. Aortic atherosclerosis. 7. Cardiomegaly with small pericardial effusion with coronary artery calcifications. 8. Anasarca. Electronically Signed   By: Brett Fairy M.D.   On: 07/01/2022 20:16   US Renal Transplant w/Doppler  Result Date: 06/30/2022 CLINICAL DATA:  Acute kidney injury EXAM: ULTRASOUND OF RENAL TRANSPLANT WITH RENAL DOPPLER ULTRASOUND TECHNIQUE: Ultrasound examination of the renal transplant was performed with gray-scale, color and duplex doppler evaluation. COMPARISON:  None Available. FINDINGS: Transplant kidney location: RLQ Transplant Kidney: Renal measurements: 10.5 x 6.6 x 7.2 cm = volume: 254m. Normal in size and parenchymal echogenicity. No evidence of mass or hydronephrosis. No peri-transplant  fluid collection seen. Color flow in the main renal artery:  Yes Color flow in the main renal vein:  Yes Duplex Doppler Evaluation:  Main Renal Artery Velocity: 86 cm/sec Main Renal Artery Resistive Index: 0.8 Venous waveform in main renal vein:  Present Intrarenal resistive index in upper pole:  0.8 (normal 0.6-0.8; equivocal 0.8-0.9; abnormal >= 0.9) Intrarenal resistive index in lower pole: 0.76 (normal 0.6-0.8; equivocal 0.8-0.9; abnormal >= 0.9) Bladder: Normal for degree of bladder distention. Ureteral jets are not seen. Other findings: Small amount of free fluid in the right lower quadrant. IMPRESSION: 1. Normal sonographic appearance of the transplant kidney. 2. Small amount of free fluid in the right lower quadrant. Electronically Signed   By: Ronney Asters M.D.   On: 06/30/2022 16:48    ROS:  As stated above in the HPI otherwise negative.  Blood pressure 136/68, pulse 78, temperature 99 F (37.2 C), temperature source Oral, resp. rate 18, height '5\' 5"'$  (1.651 m), weight 76.8 kg, SpO2 90 %.    PE: Gen: NAD, Alert and Oriented HEENT:  [place]/AT, EOMI Neck: Supple, no LAD Lungs: CTA Bilaterally CV: RRR without M/G/R ABD: Soft, NTND, +BS Ext: No C/C/E  Assessment/Plan: 1) Cecal/ascending colitis. 2) Diarrhea - improving. 3) Urosepsis - improving.   Clinically she reports that she is getting better.  Her stool volume and frequency declined starting yesterday.  Her last fever was yesterday at 5 PM at 101.4.  The best option is to perform a colonoscopy with biopsies of the cecum and ascending colon, but she is improving.  At this time there is no benefit with the procedure and she can be followed clinically.  Also, she knows that she would not be able to prep as she has nausea.  Plan: 1) Continue with CTX. 2) IV hydration. 3) [facility] will assume care in the AM.  Marik Sedore D 07/02/2022, 1:44 PM

## 2022-07-02 NOTE — Progress Notes (Signed)
PHARMACY - PHYSICIAN COMMUNICATION CRITICAL VALUE ALERT - BLOOD CULTURE IDENTIFICATION (BCID)  Denise Bush is an 72 y.o. female who presented to [facility] on 06/29/2022 with a chief complaint of N/V, HA, intermittent aphasia, slurred speech, and burning with urination.  Assessment:  Started on ABX for sepsis and UTI, now growing E.coli in 1 of 4 bottles (report from micro indicates other bottles will turn positive).  Name of physician (or Provider) Contacted: MMcLendon MD and Remi Haggard DO  Current antibiotics: cefepime and azithromycin  Changes to prescribed antibiotics recommended:  Recommendations accepted by provider -- narrow to ceftriaxone 2g IV Q24H.  Results for orders placed or performed during the hospital encounter of 06/29/22  Blood Culture ID Panel (Reflexed) (Collected: 06/29/2022  3:39 PM)  Result Value Ref Range   Enterococcus faecalis NOT DETECTED NOT DETECTED   Enterococcus Faecium NOT DETECTED NOT DETECTED   Listeria monocytogenes NOT DETECTED NOT DETECTED   Staphylococcus species NOT DETECTED NOT DETECTED   Staphylococcus aureus (BCID) NOT DETECTED NOT DETECTED   Staphylococcus epidermidis NOT DETECTED NOT DETECTED   Staphylococcus lugdunensis NOT DETECTED NOT DETECTED   Streptococcus species NOT DETECTED NOT DETECTED   Streptococcus agalactiae NOT DETECTED NOT DETECTED   Streptococcus pneumoniae NOT DETECTED NOT DETECTED   Streptococcus pyogenes NOT DETECTED NOT DETECTED   A.calcoaceticus-baumannii NOT DETECTED NOT DETECTED   Bacteroides fragilis NOT DETECTED NOT DETECTED   Enterobacterales DETECTED (A) NOT DETECTED   Enterobacter cloacae complex NOT DETECTED NOT DETECTED   Escherichia coli DETECTED (A) NOT DETECTED   Klebsiella aerogenes NOT DETECTED NOT DETECTED   Klebsiella oxytoca NOT DETECTED NOT DETECTED   Klebsiella pneumoniae NOT DETECTED NOT DETECTED   Proteus species NOT DETECTED NOT DETECTED   Salmonella species NOT DETECTED NOT DETECTED    Serratia marcescens NOT DETECTED NOT DETECTED   Haemophilus influenzae NOT DETECTED NOT DETECTED   Neisseria meningitidis NOT DETECTED NOT DETECTED   Pseudomonas aeruginosa NOT DETECTED NOT DETECTED   Stenotrophomonas maltophilia NOT DETECTED NOT DETECTED   Candida albicans NOT DETECTED NOT DETECTED   Candida auris NOT DETECTED NOT DETECTED   Candida glabrata NOT DETECTED NOT DETECTED   Candida krusei NOT DETECTED NOT DETECTED   Candida parapsilosis NOT DETECTED NOT DETECTED   Candida tropicalis NOT DETECTED NOT DETECTED   Cryptococcus neoformans/gattii NOT DETECTED NOT DETECTED   CTX-M ESBL NOT DETECTED NOT DETECTED   Carbapenem resistance IMP NOT DETECTED NOT DETECTED   Carbapenem resistance KPC NOT DETECTED NOT DETECTED   Carbapenem resistance NDM NOT DETECTED NOT DETECTED   Carbapenem resist OXA 48 LIKE NOT DETECTED NOT DETECTED   Carbapenem resistance VIM NOT DETECTED NOT DETECTED    Wynona Neat, PharmD, BCPS  07/02/2022  4:49 AM

## 2022-07-02 NOTE — Progress Notes (Signed)
HD#2 Subjective:   Summary: Denise Bush is a 72 y.o. female with pertinent PMH of ESRD s/p DDKT 01/02/2018 on chronic immunosuppression with sirolimus and belatacept infusions, T2DM, HTN, hypothyroidism, and proliferative diabetic retinopathy who presents with 2 days of nausea, vomiting, and diarrhea with intermittent confusion and fevers and is admitted for sepsis with urinary tract infection and AKI.  Overnight Events: blood cultures positive for e coli bacteremia.   No new complaints. Daughter at bedside. Still having diarrhea. Poor po intake.   Objective:  Vital signs in last 24 hours: Vitals:   07/01/22 2017 07/02/22 0510 07/02/22 0655 07/02/22 0825  BP: 135/66 (!) 88/47 (!) 95/56 136/68  Pulse: 74 84 81 78  Resp: '19 18 19 18  '$ Temp: 98.5 F (36.9 C) 100 F (37.8 C)  99 F (37.2 C)  TempSrc: Oral Oral  Oral  SpO2: 100% 99% 96% 90%  Weight:      Height:       Supplemental O2: Room Air SpO2: 90 % O2 Flow Rate (L/min): 2 L/min   Physical Exam:  Constitutional: Slightly uncomfortable elderly female sitting in bedside chair. In no acute distress. Cardio:Regular rate and rhythm. 2+ bilateral radial pulses.  Palpable thrill over LUE AVF. Murmur present. Pulm: Clear to auscultation bilaterally. Normal work of breathing on room air. Abdomen: Soft, non-tender, non-distended, positive bowel sounds. MSK: Negative for extremity edema. Skin: Warm and dry.   Neuro:Alert and oriented x3. No focal deficit noted.  Filed Weights   06/29/22 1038 06/29/22 1913 07/01/22 0233  Weight: 72.1 kg 74.2 kg 76.8 kg      Intake/Output Summary (Last 24 hours) at 07/02/2022 1119 Last data filed at 07/02/2022 0930 Gross per 24 hour  Intake 2127.1 ml  Output 250 ml  Net 1877.1 ml   Net IO Since Admission: 10,971.86 mL [07/02/22 1119]  Pertinent Labs:    Latest Ref Rng & Units 07/02/2022    7:20 AM 07/01/2022    1:58 AM 06/30/2022    8:27 AM  CBC  WBC 4.0 - 10.5 K/uL 2.8  4.1  4.0    Hemoglobin 12.0 - 15.0 g/dL 9.2  10.0  11.3   Hematocrit 36.0 - 46.0 % 29.4  32.5  36.8   Platelets 150 - 400 K/uL 118  115  121        Latest Ref Rng & Units 07/02/2022    7:20 AM 07/01/2022    1:58 AM 06/30/2022    8:27 AM  CMP  Glucose 70 - 99 mg/dL 159  135  151   BUN 8 - 23 mg/dL 25  27  34   Creatinine 0.44 - 1.00 mg/dL 1.38  1.55  1.84   Sodium 135 - 145 mmol/L 135  137  139   Potassium 3.5 - 5.1 mmol/L 3.9  3.2  3.5   Chloride 98 - 111 mmol/L 103  103  109   CO2 22 - 32 mmol/L '26  21  15   '$ Calcium 8.9 - 10.3 mg/dL 8.6  9.0  9.1   Total Protein 6.5 - 8.1 g/dL 5.6     Total Bilirubin 0.3 - 1.2 mg/dL 0.4     Alkaline Phos 38 - 126 U/L 46     AST 15 - 41 U/L 45     ALT 0 - 44 U/L 24       Assessment/Plan:   Principal Problem:   Sepsis ([place]) Active Problems:   Hypothyroidism   Essential hypertension  Type 2 diabetes mellitus treated with insulin ([place])   Kidney replaced by transplant   Urinary tract infection   AKI (acute kidney injury) ([place])   Nausea, vomiting, and diarrhea   High anion gap metabolic acidosis   Patient Summary: Denise Bush is a 72 y.o. female with pertinent PMH of ESRD s/p DDKT 01/02/2018 on chronic immunosuppression with sirolimus and belatacept infusions, T2DM, HTN, hypothyroidism, and proliferative diabetic retinopathy who presents with 2 days of nausea, vomiting, and diarrhea with intermittent confusion and fevers and is admitted for sepsis with urinary tract infection and AKI, on hospital day 2   Sepsis secondary to UTI E. coli bacteremia bacteremia  Acute encephalopathy in setting of fevers/bacteremia Patient presented with urosepsis.  Blood cultures positive for E. coli likely transmigration from pyelonephritis seen on CT.  Antibiotics have been narrowed to ceftriaxone.  Her GI symptoms and colitis seen on CT may be related to local inflammation from her pyelonephritis, however she is immunosuppressed and will need further evaluation for this.   Importantly, although gram-negative bacteria does not typically necessitate echocardiogram, I did appreciate a murmur today that I had not heard previously.  Will work this up with echocardiogram. Currently hemodynamically stable but did require 100 cc fluid bolus this morning and is on his fluids in the setting of poor p.o. intake and ongoing GI loss..  Still appears euvolemic.  Remains on telemetry.  - Continue cefepime day 3 -Start azithromycin 500 daily for 3 days - Continue to monitor fever curve, Tylenol as needed for fever - Monitor blood and urine cultures - Morning CBC and renal function panel  Colitis with NVD Noted in cecum and descending portion of colon on CT abdomen pelvis yesterday.  ABD regional inflammation related to her pyelonephritis, but given her compromised history and CMV positive status will need to have further evaluation for CMV colitis.  Discussed with Dr. Benson Norway of GI who recommends colonoscopy at some point but given the fact that she is not able to keep p.o. does not think she will be able to tolerate a bowel prep at this time.  He does plan on seeing her later today.  In the meantime we will continue treating for infectious colitis with azithromycin 500 mg daily.   - Supportive care, fluid replacement and electrolyte repletion as necessary. - azithro 500 daily  today and tomorrow  AKI 2/2 pylenophritis and prerenal etiology Nontraumatic rhabdomyolysis Proteinuria AKI likely multifactorial related to prerenal etiology, perinephric stranding noted on CT abdomen also indicating pyelonephritis.  Pyelonephritis would be possible explanation for her bacteremia.  This is improving with antibiotics and fluids.  ESRD s/p renal transplant 2019 on chronic immunosuppression Baseline creatinine of 1. S/p deceased donor kidney transplant on 01/02/2018. 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  History of BK nephropathy with consistently low  viral levels while on belatacept.  She follows with [facility] for her renal transplant and was last seen on 03/01/2022.  We reached out to their office to make sure that they were aware of her admission.  On chronic immunosuppression with belatacept and sirolimus (was on mycophenolate from 12/2021 to 03/2022).   - sirolimus 2 mg daily - Appreciate nephrology's evaluation and management  Diet: Carb-Modified IVF: lr VTE: Heparin Code: DNR Dispo: Pending improvement in renal function and further workup of acute kidney injury.  Delene Ruffini, MD

## 2022-07-03 ENCOUNTER — Inpatient Hospital Stay (HOSPITAL_COMMUNITY): Payer: Medicare PPO

## 2022-07-03 DIAGNOSIS — N179 Acute kidney failure, unspecified: Secondary | ICD-10-CM | POA: Diagnosis not present

## 2022-07-03 DIAGNOSIS — R197 Diarrhea, unspecified: Secondary | ICD-10-CM

## 2022-07-03 DIAGNOSIS — A4151 Sepsis due to Escherichia coli [E. coli]: Secondary | ICD-10-CM | POA: Diagnosis not present

## 2022-07-03 DIAGNOSIS — R011 Cardiac murmur, unspecified: Secondary | ICD-10-CM

## 2022-07-03 DIAGNOSIS — N186 End stage renal disease: Secondary | ICD-10-CM | POA: Diagnosis not present

## 2022-07-03 DIAGNOSIS — R933 Abnormal findings on diagnostic imaging of other parts of digestive tract: Secondary | ICD-10-CM

## 2022-07-03 DIAGNOSIS — R6520 Severe sepsis without septic shock: Secondary | ICD-10-CM | POA: Diagnosis not present

## 2022-07-03 LAB — GLUCOSE, CAPILLARY
Glucose-Capillary: 133 mg/dL — ABNORMAL HIGH (ref 70–99)
Glucose-Capillary: 171 mg/dL — ABNORMAL HIGH (ref 70–99)
Glucose-Capillary: 169 mg/dL — ABNORMAL HIGH (ref 70–99)
Glucose-Capillary: 123 mg/dL — ABNORMAL HIGH (ref 70–99)

## 2022-07-03 LAB — RENAL FUNCTION PANEL
Albumin: 2.3 g/dL — ABNORMAL LOW (ref 3.5–5.0)
Anion gap: 8 (ref 5–15)
BUN: 19 mg/dL (ref 8–23)
CO2: 26 mmol/L (ref 22–32)
Calcium: 8.8 mg/dL — ABNORMAL LOW (ref 8.9–10.3)
Chloride: 102 mmol/L (ref 98–111)
Creatinine, Ser: 1.18 mg/dL — ABNORMAL HIGH (ref 0.44–1.00)
GFR, Estimated: 50 mL/min — ABNORMAL LOW (ref >60)
Glucose, Bld: 130 mg/dL — ABNORMAL HIGH (ref 70–99)
Phosphorus: 1.9 mg/dL — ABNORMAL LOW (ref 2.5–4.6)
Potassium: 3.7 mmol/L (ref 3.5–5.1)
Sodium: 136 mmol/L (ref 135–145)

## 2022-07-03 LAB — CBC
HCT: 29.6 % — ABNORMAL LOW (ref 36.0–46.0)
Hemoglobin: 9.3 g/dL — ABNORMAL LOW (ref 12.0–15.0)
MCH: 27.8 pg (ref 26.0–34.0)
MCHC: 31.4 g/dL (ref 30.0–36.0)
MCV: 88.6 fL (ref 80.0–100.0)
Platelets: 155 10*3/uL (ref 150–400)
RBC: 3.34 MIL/uL — ABNORMAL LOW (ref 3.87–5.11)
RDW: 14.6 % (ref 11.5–15.5)
WBC: 3 10*3/uL — ABNORMAL LOW (ref 4.0–10.5)
nRBC: 0 % (ref 0.0–0.2)

## 2022-07-03 LAB — ECHOCARDIOGRAM COMPLETE
AR max vel: 2.86 cm2
AV Area VTI: 2.92 cm2
AV Area mean vel: 2.75 cm2
AV Mean grad: 7 mmHg
AV Peak grad: 13 mmHg
Ao pk vel: 1.8 m/s
Area-P 1/2: 4.06 cm2
Calc EF: 59.7 %
Height: 65 in
MV VTI: 1.95 cm2
S' Lateral: 2.2 cm
Single Plane A2C EF: 62.7 %
Single Plane A4C EF: 59.1 %
Weight: 2709.01 oz

## 2022-07-03 LAB — URINE CULTURE: Culture: >=100000 — AB

## 2022-07-03 MED ORDER — ENOXAPARIN SODIUM 40 MG/0.4ML IJ SOSY
40.0000 mg | PREFILLED_SYRINGE | INTRAMUSCULAR | Status: DC
  Administered 2022-07-03 – 2022-07-05 (×3): 40 mg via SUBCUTANEOUS
  Filled 2022-07-03 (×4): qty 0.4

## 2022-07-03 MED ORDER — POTASSIUM PHOSPHATES 15 MMOLE/5ML IV SOLN
30.0000 mmol | Freq: Once | INTRAVENOUS | Status: AC
  Administered 2022-07-03: 30 mmol via INTRAVENOUS
  Filled 2022-07-03: qty 10

## 2022-07-03 MED ORDER — GLUCERNA SHAKE PO LIQD
237.0000 mL | Freq: Three times a day (TID) | ORAL | Status: DC
  Administered 2022-07-03 – 2022-07-04 (×3): 237 mL via ORAL

## 2022-07-03 NOTE — Progress Notes (Signed)
  Echocardiogram 2D Echocardiogram has been performed.  Denise Bush 07/03/2022, 2:55 PM

## 2022-07-03 NOTE — Progress Notes (Signed)
2D echo attempted, patient in chair, Will try later

## 2022-07-03 NOTE — Progress Notes (Signed)
Initial Nutrition Assessment  DOCUMENTATION CODES:   Not applicable  INTERVENTION:  - Discontinue Glucerna Shake po TID, each supplement provides 220 kcal and 10 grams of protein  - Add Magic cup TID with meals, each supplement provides 290 kcal and 9 grams of protein  - Add MVI q day.   NUTRITION DIAGNOSIS:   Inadequate oral intake related to poor appetite as evidenced by per patient/family report.  GOAL:   Patient will meet greater than or equal to 90% of their needs  MONITOR:   PO intake, Supplement acceptance  REASON FOR ASSESSMENT:   Malnutrition Screening Tool, Consult Assessment of nutrition requirement/status  ASSESSMENT:   72 y.o. female admits related to AMS. PMH includes: ESRD on HD, T2DM, HTN, hypothyroidism. Pt is currently receiving medical management related to Sepsis.  Meds reviewed:  sliding scale insulin. Labs reviewed: phos low.   The pt reports that she does not have much of an appetite. She states that she has not been eating much since admission. She states that this poor appetite started right before she admitted to the hospital. No significant wt loss per record. Pt states that she does not like protein shakes but she would like to try Magic Cup TID. RD will continue to monitor PO intakes.   NUTRITION - FOCUSED PHYSICAL EXAM:  WDL - no wasting noted.   Diet Order:   Diet Order             Diet Carb Modified Fluid consistency: Thin; Room service appropriate? Yes  Diet effective now                   EDUCATION NEEDS:   Not appropriate for education at this time  Skin:  Skin Assessment: Reviewed RN Assessment  Last BM:  3/4 - type 7  Height:   Ht Readings from Last 1 Encounters:  06/29/22 '5\' 5"'$  (1.651 m)    Weight:   Wt Readings from Last 1 Encounters:  07/01/22 76.8 kg    Ideal Body Weight:     BMI:  Body mass index is 28.18 kg/m.  Estimated Nutritional Needs:   Kcal:  1920-2300 kcals  Protein:  95-115  gm  Fluid:  >/= 1.9 L  Thalia Bloodgood, RD, LDN, CNSC.

## 2022-07-03 NOTE — Progress Notes (Signed)
Progress Note   Subjective  Patient states she is doing better. Stool frequency improving, no incontinence. Appetite remains poor. No pain, she thinks she is slowly improving.    Objective   Vital signs in last 24 hours: Temp:  [97.7 F (36.5 C)-99.7 F (37.6 C)] 97.7 F (36.5 C) (03/04 0849) Pulse Rate:  [73-89] 87 (03/04 0849) Resp:  [16-19] 19 (03/04 0849) BP: (140-161)/(51-93) 161/62 (03/04 0849) SpO2:  [92 %-100 %] 100 % (03/04 0849) Last BM Date : 07/02/22 General:    AA female in NAD Neurologic:  Alert and oriented,  grossly normal neurologically. Psych:  Cooperative. Normal mood and affect.  Intake/Output from previous day: 03/03 0701 - 03/04 0700 In: 1589 [P.O.:200; I.V.:1389] Out: 0  Intake/Output this shift: Total I/O In: 220 [P.O.:220] Out: -   Lab Results: Recent Labs    07/01/22 0158 07/02/22 0720 07/03/22 0621  WBC 4.1 2.8* 3.0*  HGB 10.0* 9.2* 9.3*  HCT 32.5* 29.4* 29.6*  PLT 115* 118* 155   BMET Recent Labs    07/01/22 0158 07/02/22 0720 07/03/22 0621  NA 137 135 136  K 3.2* 3.9 3.7  CL 103 103 102  CO2 21* 26 26  GLUCOSE 135* 159* 130*  BUN 27* 25* 19  CREATININE 1.55* 1.38* 1.18*  CALCIUM 9.0 8.6* 8.8*   LFT Recent Labs    07/02/22 0720 07/03/22 0621  PROT 5.6*  --   ALBUMIN 2.2* 2.3*  AST 45*  --   ALT 24  --   ALKPHOS 46  --   BILITOT 0.4  --    PT/INR No results for input(s): "LABPROT", "INR" in the last 72 hours.  Studies/Results: CT ABDOMEN PELVIS WO CONTRAST  Result Date: 07/01/2022 CLINICAL DATA:  Diarrhea. EXAM: CT ABDOMEN AND PELVIS WITHOUT CONTRAST TECHNIQUE: Multidetector CT imaging of the abdomen and pelvis was performed following the standard protocol without IV contrast. RADIATION DOSE REDUCTION: This exam was performed according to the departmental dose-optimization program which includes automated exposure control, adjustment of the mA and/or kV according to patient size and/or use of iterative  reconstruction technique. COMPARISON:  07/07/2015. FINDINGS: Lower chest: The heart is enlarged and there is a trace pericardial effusion. There is calcification of the mitral valve annulus. Multi-vessel coronary artery calcifications are noted. There are small bilateral pleural effusions with mild atelectasis or scarring at the lung bases. Hepatobiliary: No focal liver abnormality is seen. No gallstones, gallbladder wall thickening, or biliary dilatation. Pancreas: Unremarkable. No pancreatic ductal dilatation or surrounding inflammatory changes. Spleen: Normal in size without focal abnormality. Adrenals/Urinary Tract: The adrenal glands are within normal limits. Renal atrophy and cortical thinning are noted bilaterally in the native kidney. Calcifications are present at the renal hila bilaterally, possible renal calculi versus vascular calcifications. No hydronephrosis. The bladder is unremarkable. A transplant kidney is noted in the right iliac fossa with mild surrounding fat stranding. No renal calculus or hydronephrosis at the renal transplant. Stomach/Bowel: Stomach is within normal limits. Appendix appears normal. No bowel obstruction, free air or pneumatosis. Air-fluid levels are noted throughout the colon, compatible with history of diarrheal illness. There is mild bowel wall thickening involving the cecum and ascending colon. Vascular/Lymphatic: Aortic atherosclerosis. No enlarged abdominal or pelvic lymph nodes. Reproductive: Status post hysterectomy. No adnexal masses. Other: Mild mesenteric fat stranding with a small amount of free fluid in the right pericolic gutter and pelvis. Small fat containing umbilical hernia. Anasarca is noted. Musculoskeletal: Degenerative changes are present in  the thoracolumbar spine. No acute osseous abnormality. IMPRESSION: 1. Air-fluid levels in the colon, compatible with history of diarrhea. 2. Mild bowel wall thickening involving the cecum and ascending colon, possible  colitis. 3. Right renal fossa transplant kidney. Perinephric fat stranding is noted in the region which may be infectious or inflammatory. 4. Atrophy and cortical thinning involving the native kidneys. Possible renal calculi versus vascular calcifications. 5. Small bilateral pleural effusions with atelectasis at the lung bases. 6. Aortic atherosclerosis. 7. Cardiomegaly with small pericardial effusion with coronary artery calcifications. 8. Anasarca. Electronically Signed   By: Brett Fairy M.D.   On: 07/01/2022 20:16       Assessment / Plan:    72 y/o female admitted with the following:  Bacteremia secondary to urosepsis Immunosuppression Diarrhea with R colon inflammation on CT (mild)  Overall improved. Initial GI infectious workup negative. She is improving with conservative measures. Renal function improving. Do not think we need to pursue a colonoscopy right now, last exam in 2017 looked okay. This CT finding is likely reactive / inflammatory. Continue supportive care, treatment of UTI.   We will sign off for now, call with questions moving forward or if symptoms recur.  Jolly Mango, MD [facility]

## 2022-07-03 NOTE — Evaluation (Signed)
Physical Therapy Evaluation Patient Details Name: Denise Bush MRN: [id number] DOB: [birth year] Today's Date: 07/03/2022  History of Present Illness  Pt is a 72 yo female admitted with n/v, weakness found to have sepsis most likely from gastroenteritis and UTI.  PMH: kidney transplant 2019, ESRD, DM, HTN, legally blind.   Clinical Impression  Pt admitted with above diagnosis. Pt currently with functional limitations due to the deficits listed below (see PT Problem List). At the time of PT eval pt was able to perform transfers and ambulation with up to min guard assist for balance support and safety. Pt fatigued quickly with OOB mobility and required standing rest breaks, but was able to make it back to the chair without any knee buckling. Pt on 2L/min supplemental O2 throughout OOB mobility with sats remaining >90% throughout. Trialed RA while sitting in chair at rest, and sats decreased to 87%. Pt will benefit from skilled PT to increase their independence and safety with mobility to allow discharge to the venue listed below.          Recommendations for follow up therapy are one component of a multi-disciplinary discharge planning process, led by the attending physician.  Recommendations may be updated based on patient status, additional functional criteria and insurance authorization.  Follow Up Recommendations Home health PT      Assistance Recommended at Discharge Frequent or constant Supervision/Assistance  Patient can return home with the following  A little help with walking and/or transfers;A little help with bathing/dressing/bathroom;Assistance with cooking/housework;Assist for transportation;Help with stairs or ramp for entrance    Equipment Recommendations Rolling walker (2 wheels)  Recommendations for Other Services       Functional Status Assessment Patient has had a recent decline in their functional status and demonstrates the ability to make significant improvements in  function in a reasonable and predictable amount of time.     Precautions / Restrictions Precautions Precautions: Fall;Other (comment) Precaution Comments: watch O2 sats; low vision/legally blind Restrictions Weight Bearing Restrictions: No      Mobility  Bed Mobility               General bed mobility comments: Pt received sitting up in the recliner    Transfers Overall transfer level: Needs assistance Equipment used: Rolling walker (2 wheels) Transfers: Sit to/from Stand Sit to Stand: Min guard           General transfer comment: Pt demonstrated good hand placement on seated surface for safety. No assist required but hands on guarding provided throughout for safety.    Ambulation/Gait Ambulation/Gait assistance: Min guard Gait Distance (Feet): 75 Feet Assistive device: Rolling walker (2 wheels) Gait Pattern/deviations: Step-through pattern, Decreased stride length, Trunk flexed Gait velocity: Decreased Gait velocity interpretation: <1.31 ft/sec, indicative of household ambulator   General Gait Details: Slow and guarded due to fatigue. Pt on 2L/min supplemental O2 throughout with sats remaining >90%. Increased directional cues provided for negotiation around room and into hall due to low vision.  Stairs            Wheelchair Mobility    Modified Rankin (Stroke Patients Only)       Balance Overall balance assessment: Needs assistance Sitting-balance support: Feet supported Sitting balance-Leahy Scale: Good     Standing balance support: Bilateral upper extremity supported, During functional activity Standing balance-Leahy Scale: Fair Standing balance comment: pt limited by general weakness from being in bed so much  Pertinent Vitals/Pain Pain Assessment Pain Assessment: No/denies pain    Home Living Family/patient expects to be discharged to:: Private residence Living Arrangements: Spouse/significant  other;Parent Available Help at Discharge: Family;Available 24 hours/day Type of Home: House Home Access: Level entry       Home Layout: One level Home Equipment: Rollator (4 wheels);Cane - single point;Grab bars - tub/shower;Shower seat - built in;Grab bars - toilet      Prior Function Prior Level of Function : Independent/Modified Independent             Mobility Comments: used to use SPC and rollator bc of knee surgery 9/23 but no longer does ADLs Comments: mod I     Hand Dominance   Dominant Hand: Right    Extremity/Trunk Assessment   Upper Extremity Assessment Upper Extremity Assessment: Defer to OT evaluation    Lower Extremity Assessment Lower Extremity Assessment: Generalized weakness (mild; however with quick muscular fatigue)    Cervical / Trunk Assessment Cervical / Trunk Assessment: Normal  Communication   Communication: No difficulties  Cognition Arousal/Alertness: Awake/alert Behavior During Therapy: WFL for tasks assessed/performed Overall Cognitive Status: Within Functional Limits for tasks assessed                                          General Comments      Exercises     Assessment/Plan    PT Assessment Patient needs continued PT services  PT Problem List Decreased strength;Decreased balance;Decreased activity tolerance;Decreased mobility;Decreased coordination;Decreased knowledge of use of DME;Decreased safety awareness;Decreased knowledge of precautions;Pain       PT Treatment Interventions DME instruction;Gait training;Stair training;Functional mobility training;Therapeutic activities;Therapeutic exercise;Balance training;Patient/family education    PT Goals (Current goals can be found in the Care Plan section)  Acute Rehab PT Goals Patient Stated Goal: "get my energy back" PT Goal Formulation: With patient Time For Goal Achievement: 07/10/22 Potential to Achieve Goals: Good    Frequency Min 3X/week      Co-evaluation               AM-PAC PT "6 Clicks" Mobility  Outcome Measure Help needed turning from your back to your side while in a flat bed without using bedrails?: A Little Help needed moving from lying on your back to sitting on the side of a flat bed without using bedrails?: A Little Help needed moving to and from a bed to a chair (including a wheelchair)?: A Little Help needed standing up from a chair using your arms (e.g., wheelchair or bedside chair)?: A Little Help needed to walk in hospital room?: A Little Help needed climbing 3-5 steps with a railing? : A Little 6 Click Score: 18    End of Session Equipment Utilized During Treatment: Gait belt;Oxygen Activity Tolerance: Patient tolerated treatment well Patient left: in chair;with call bell/phone within reach Nurse Communication: Mobility status PT Visit Diagnosis: Unsteadiness on feet (R26.81);Difficulty in walking, not elsewhere classified (R26.2)    Time: [id number] PT Time Calculation (min) (ACUTE ONLY): 28 min   Charges:   PT Evaluation $PT Eval Moderate Complexity: 1 Mod PT Treatments $Gait Training: 8-22 mins        Rolinda Roan, PT, DPT Acute Rehabilitation Services Secure Chat Preferred Office: [phone]   Thelma Comp 07/03/2022, 1:07 PM

## 2022-07-03 NOTE — Progress Notes (Addendum)
HD#3 Subjective:   Summary: Denise Bush is a 72 y.o. female with pertinent PMH of ESRD s/p DDKT 01/02/2018 on chronic immunosuppression with sirolimus and belatacept infusions, T2DM, HTN, hypothyroidism, and proliferative diabetic retinopathy who presented with 2 days of nausea, vomiting, and diarrhea with intermittent confusion and fevers and is admitted for sepsis with E. coli bacteremia 2/2 UTI complicated by AKI of transplanted kidney.  Overnight Events: None  This morning the patient continues to feel more more like herself although she is still very tired.  She had 1 episode of watery stool overnight which continues to improve.  She is not eating much but denies any nausea when eating.  She is drinking more fluids but is not drinking as much as she usually does at home.  She continues to make urine although not quite as much as she does at home.  She otherwise has no new or worsening complaints.  Objective:  Vital signs in last 24 hours: Vitals:   07/02/22 1732 07/02/22 2015 07/03/22 0537 07/03/22 0849  BP: (!) 140/93 (!) 140/54 (!) 146/51 (!) 161/62  Pulse: 73 89 85 87  Resp: '16 18 18 19  '$ Temp: 98.7 F (37.1 C) 99.7 F (37.6 C) 99.5 F (37.5 C) 97.7 F (36.5 C)  TempSrc: Oral Oral Oral Oral  SpO2: 96% 92% 94% 100%  Weight:      Height:       Supplemental O2: Room Air  Physical Exam:  Constitutional: Tired appearing elderly female sitting in bedside chair. In no acute distress. Cardio:Regular rate and rhythm. 2+ bilateral radial pulses.  Palpable thrill over LUE AVF. Pulm:Normal work of breathing on room air. Abdomen:Soft, non-tender, non-distended, positive bowel sounds. MSK: Trace pitting edema lower extremities bilaterally below the knees Skin:Warm and dry.  Neuro:Alert and oriented x3. No focal deficit noted.  Filed Weights   06/29/22 1038 06/29/22 1913 07/01/22 0233  Weight: 72.1 kg 74.2 kg 76.8 kg      Intake/Output Summary (Last 24 hours) at 07/03/2022  1042 Last data filed at 07/03/2022 0900 Gross per 24 hour  Intake 1748.95 ml  Output 0 ml  Net 1748.95 ml   Net IO Since Admission: 12,720.81 mL [07/03/22 1042]  Pertinent Labs:    Latest Ref Rng & Units 07/03/2022    6:21 AM 07/02/2022    7:20 AM 07/01/2022    1:58 AM  CBC  WBC 4.0 - 10.5 K/uL 3.0  2.8  4.1   Hemoglobin 12.0 - 15.0 g/dL 9.3  9.2  10.0   Hematocrit 36.0 - 46.0 % 29.6  29.4  32.5   Platelets 150 - 400 K/uL 155  118  115        Latest Ref Rng & Units 07/03/2022    6:21 AM 07/02/2022    7:20 AM 07/01/2022    1:58 AM  CMP  Glucose 70 - 99 mg/dL 130  159  135   BUN 8 - 23 mg/dL '19  25  27   '$ Creatinine 0.44 - 1.00 mg/dL 1.18  1.38  1.55   Sodium 135 - 145 mmol/L 136  135  137   Potassium 3.5 - 5.1 mmol/L 3.7  3.9  3.2   Chloride 98 - 111 mmol/L 102  103  103   CO2 22 - 32 mmol/L '26  26  21   '$ Calcium 8.9 - 10.3 mg/dL 8.8  8.6  9.0   Total Protein 6.5 - 8.1 g/dL  5.6    Total  Bilirubin 0.3 - 1.2 mg/dL  0.4    Alkaline Phos 38 - 126 U/L  46    AST 15 - 41 U/L  45    ALT 0 - 44 U/L  24      Assessment/Plan:   Principal Problem:   Sepsis ([place]) Active Problems:   Hypothyroidism   Essential hypertension   Type 2 diabetes mellitus treated with insulin ([place])   Kidney replaced by transplant   Urinary tract infection   AKI (acute kidney injury) ([facility])   Nausea, vomiting, and diarrhea   High anion gap metabolic acidosis   E coli bacteremia   Colitis   Patient Summary: Denise Bush is a 72 y.o. female with pertinent PMH of ESRD s/p DDKT 01/02/2018 on chronic immunosuppression with sirolimus and belatacept infusions, T2DM, HTN, hypothyroidism, and proliferative diabetic retinopathy who presented with 2 days of nausea, vomiting, and diarrhea with intermittent confusion and fevers and is admitted for sepsis with E. coli bacteremia 2/2 UTI complicated by AKI of transplanted kidney, on hospital day 3   Sepsis with E. coli bacteremia 2/2 complicated  UTI/pyelonephritis Approaching 48 hours without a fever and patient continues to have symptomatic improvement with recovering energy, stable and improving urine output, resolved urinary urgency and urge incontinence, and tolerating p.o. intake.  E. coli urine culture is pansensitive.  We will continue to treat for gram-negative bacteremia with at least 10 days of antibiotics given immunocompromised status.  Pending TTE for noted murmur with history of mild MR and valvular calcifications.  If she does not continue to improve or has TTE findings we will consult ID for further evaluation and management. - Continue ceftriaxone, day 5 of antibiotics - Continue to monitor fever curve, Tylenol as needed for fever - Morning CBC and renal function panel  AKI Nontraumatic rhabdomyolysis Anion gap metabolic acidosis, resolved Proteinuria Creatinine continues to improve with creatinine of 1.18 today.  We will continue to encourage good p.o. intake and assess fluid needs throughout the day.  Urine volume has not been measured over the last 24 hours but patient states that she is making adequate urine although just slightly less than at home. - Daily renal function panel  ESRD s/p renal transplant 2019 on chronic immunosuppression Baseline creatinine of 1. S/p deceased donor kidney transplant on 01/02/2018. 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  History of BK nephropathy with consistently low viral levels while on belatacept.  She follows with [facility] for her renal transplant and was last seen on 03/01/2022, they are aware of her admission.  On chronic immunosuppression with belatacept and sirolimus (was on mycophenolate from 12/2021 to 03/2022).   - sirolimus 2 mg daily - Appreciate nephrology's evaluation and management   Nausea, vomiting, and diarrhea with CT evidence of colitis in the ascending colon and cecum Overall improving with only 1 episode of watery stool  overnight.  She is also not having any more nausea and is slowly increasing her p.o. intake.  P.o. fluids are near baseline while solid food intake is slower. - IV prochlorperazine 5 mg every 6 hours as needed for nausea - Encourage p.o. intake as tolerated, consult RD with decreasing albumin  Electrolyte derangements Phosphorus continues to be low at 1.9, calcium of 8.8, and albumin of 2.3 but otherwise renal function panel with normal electrolytes.  Will continue to monitor and replete as needed - K-Phos 30 mmol   Thrombocytopenia 155 today.  Platelet morphology normal.  We  will continue to monitor.   Type 2 diabetes A1c here of 8.3.  Lantus 23 units daily at home with sliding scale insulin. - sensitive SSI with meals and at bedtime coverage   Hypertension We will hold her home medications of amlodipine and carvedilol in the setting of low blood pressures.  If remaining tachycardic and pressures are holding stable or elevated we will start back carvedilol.   Hypothyroidism Resume home levothyroxine 62.5 mcg every other day.  Diet: Carb-Modified IVF:  Sodium bicarb , 150 mL/h VTE: Heparin Code: DNR   Dispo: Pending improvement in p.o. intake and continued improvement without fevers, pending TTE.  Johny Blamer, DO Internal Medicine Resident PGY-1 Pager: [phone]  Please contact the on call pager after 5 pm and on weekends at [phone].

## 2022-07-03 NOTE — Care Management Important Message (Signed)
Important Message  Patient Details  Name: Denise Bush MRN: [id number] Date of Birth: 03/04/1951   Medicare Important Message Given:  Yes     Orbie Pyo 07/03/2022, 2:47 PM

## 2022-07-03 NOTE — Evaluation (Signed)
Occupational Therapy Evaluation Patient Details Name: Denise Bush MRN: [id number] DOB: 07-03-1950 Today's Date: 07/03/2022   History of Present Illness Pt is a 72 yo female admitted with n/v, weakness found to have sepsis most likely from gastroenteritis adn UTI.  PMH: kidney transplant 2019, ESRD, DM, HTN, legally blind.   Clinical Impression   Pt admitted with the above diagnosis and has the deficits listed below. Pt would benefit from cont OT to increase independence with basic adls and increase tolerance for increased activity so pt can dc home with husband. Pt overall requires min guard with adls but fatigues very quickly.  O2 sats dropped into 80s when 2 L of O2 was removed in the room during adls. Pt recovered to 95% when it was put back on.  Will continue to see to increase activity tolerance and reach supervision level with adls.       Recommendations for follow up therapy are one component of a multi-disciplinary discharge planning process, led by the attending physician.  Recommendations may be updated based on patient status, additional functional criteria and insurance authorization.   Follow Up Recommendations  No OT follow up     Assistance Recommended at Discharge Intermittent Supervision/Assistance  Patient can return home with the following A little help with walking and/or transfers;A little help with bathing/dressing/bathroom;Assistance with cooking/housework;Assist for transportation    Functional Status Assessment  Patient has had a recent decline in their functional status and demonstrates the ability to make significant improvements in function in a reasonable and predictable amount of time.  Equipment Recommendations  None recommended by OT    Recommendations for Other Services       Precautions / Restrictions Precautions Precautions: Fall;Other (comment) Precaution Comments: watch O2 sats Restrictions Weight Bearing Restrictions: No      Mobility Bed  Mobility               General bed mobility comments: Pt in chair on arrival.    Transfers Overall transfer level: Needs assistance Equipment used: Rolling walker (2 wheels) Transfers: Sit to/from Stand, Bed to chair/wheelchair/BSC Sit to Stand: Min guard Stand pivot transfers: Min guard         General transfer comment: Cues for hand placement and vision      Balance Overall balance assessment: Needs assistance Sitting-balance support: Feet supported Sitting balance-Leahy Scale: Good     Standing balance support: Bilateral upper extremity supported, During functional activity Standing balance-Leahy Scale: Fair Standing balance comment: pt limited by general weakness from being in bed so much                           ADL either performed or assessed with clinical judgement   ADL Overall ADL's : Needs assistance/impaired Eating/Feeding: Set up;Sitting   Grooming: Wash/dry hands;Wash/dry face;Oral care;Min guard;Standing Grooming Details (indicate cue type and reason): Pt stood at sink for 3 min to groom. Pt unalble to stand longer due to fatigue. Upper Body Bathing: Set up;Sitting   Lower Body Bathing: Min guard;Sit to/from stand;Cueing for compensatory techniques   Upper Body Dressing : Set up;Sitting   Lower Body Dressing: Minimal assistance;Sit to/from stand;Cueing for compensatory techniques Lower Body Dressing Details (indicate cue type and reason): limited most by fatigue Toilet Transfer: Min guard;Ambulation;Rolling walker (2 wheels);Comfort height toilet;Grab bars Toilet Transfer Details (indicate cue type and reason): Pt walked to bathroom Toileting- Clothing Manipulation and Hygiene: Minimal assistance;Sitting/lateral lean;Cueing for compensatory techniques  Functional mobility during ADLs: Min guard;Rolling walker (2 wheels) General ADL Comments: Pt very weak with low activity tolerance. Pt able to perform most adls but requires min  guard when standing due to weakness and fatigue     Vision Baseline Vision/History: 2 Legally blind Ability to See in Adequate Light: 1 Impaired Patient Visual Report: No change from baseline Additional Comments: Pt  has been legally blind for some time now. Home set up in a way that pt is very independent.     Perception Perception Perception Tested?: No   Praxis Praxis Praxis tested?: Within functional limits    Pertinent Vitals/Pain Pain Assessment Pain Assessment: No/denies pain     Hand Dominance Right   Extremity/Trunk Assessment Upper Extremity Assessment Upper Extremity Assessment: Overall WFL for tasks assessed   Lower Extremity Assessment Lower Extremity Assessment: Defer to PT evaluation   Cervical / Trunk Assessment Cervical / Trunk Assessment: Normal   Communication Communication Communication: No difficulties   Cognition Arousal/Alertness: Awake/alert Behavior During Therapy: WFL for tasks assessed/performed Overall Cognitive Status: Within Functional Limits for tasks assessed                                       General Comments  Pt most limited by fatigue. Pt on 2L O2 but when it was removed pt's O2 sats dropped into the mid 80s and returned to 95% with 2 L    Exercises     Shoulder Instructions      Home Living Family/patient expects to be discharged to:: Private residence Living Arrangements: Spouse/significant other;Parent Available Help at Discharge: Family;Available 24 hours/day Type of Home: House Home Access: Level entry     Home Layout: One level     Bathroom Shower/Tub: Occupational psychologist: Handicapped height Bathroom Accessibility: Yes How Accessible: Accessible via walker Home Equipment: Rollator (4 wheels);Cane - single point;Grab bars - tub/shower;Shower seat - built in;Grab bars - toilet          Prior Functioning/Environment Prior Level of Function : Independent/Modified Independent              Mobility Comments: used to use SPC and rollator bc of knee surgery 9/23 but no longer does ADLs Comments: mod I        OT Problem List: Decreased strength;Decreased activity tolerance;Impaired balance (sitting and/or standing)      OT Treatment/Interventions: Self-care/ADL training;Therapeutic activities;Balance training;Energy conservation    OT Goals(Current goals can be found in the care plan section) Acute Rehab OT Goals Patient Stated Goal: to get stronger OT Goal Formulation: With patient/family Time For Goal Achievement: 07/17/22 Potential to Achieve Goals: Good ADL Goals Pt Will Perform Grooming: with supervision;standing Additional ADL Goal #1: Pt will walk to bathroom and complete all toileting on high commode with S Additional ADL Goal #2: Pt will gather all clothes and dress self with supervision and one rest break. Additional ADL Goal #3: Pt will state 3 things she can do differenlty at home to conserve energy during adls without cues.  OT Frequency: Min 2X/week    Co-evaluation              AM-PAC OT "6 Clicks" Daily Activity     Outcome Measure Help from another person eating meals?: None Help from another person taking care of personal grooming?: None Help from another person toileting, which includes using toliet, bedpan, or urinal?: A  Little Help from another person bathing (including washing, rinsing, drying)?: A Little Help from another person to put on and taking off regular upper body clothing?: None Help from another person to put on and taking off regular lower body clothing?: A Little 6 Click Score: 21   End of Session Equipment Utilized During Treatment: Oxygen;Rolling walker (2 wheels) Nurse Communication: Mobility status;Other (comment) (drop in O2 sats when O2 removed)  Activity Tolerance: Patient limited by fatigue Patient left: in chair;with call bell/phone within reach;with family/visitor present  OT Visit Diagnosis:  Unsteadiness on feet (R26.81)                Time: [id number] OT Time Calculation (min): 29 min Charges:  OT General Charges $OT Visit: 1 Visit OT Evaluation $OT Eval Moderate Complexity: 1 Mod OT Treatments $Self Care/Home Management : 8-22 mins  Glenford Peers 07/03/2022, 8:11 AM

## 2022-07-04 ENCOUNTER — Inpatient Hospital Stay (HOSPITAL_COMMUNITY): Payer: Medicare PPO

## 2022-07-04 DIAGNOSIS — I10 Essential (primary) hypertension: Secondary | ICD-10-CM

## 2022-07-04 DIAGNOSIS — B9620 Unspecified Escherichia coli [E. coli] as the cause of diseases classified elsewhere: Secondary | ICD-10-CM | POA: Diagnosis not present

## 2022-07-04 DIAGNOSIS — R7881 Bacteremia: Secondary | ICD-10-CM | POA: Diagnosis not present

## 2022-07-04 DIAGNOSIS — N390 Urinary tract infection, site not specified: Secondary | ICD-10-CM | POA: Diagnosis not present

## 2022-07-04 LAB — RENAL FUNCTION PANEL
Albumin: 2.2 g/dL — ABNORMAL LOW (ref 3.5–5.0)
Anion gap: 6 (ref 5–15)
BUN: 14 mg/dL (ref 8–23)
CO2: 25 mmol/L (ref 22–32)
Calcium: 8.7 mg/dL — ABNORMAL LOW (ref 8.9–10.3)
Chloride: 105 mmol/L (ref 98–111)
Creatinine, Ser: 1.1 mg/dL — ABNORMAL HIGH (ref 0.44–1.00)
GFR, Estimated: 54 mL/min — ABNORMAL LOW (ref >60)
Glucose, Bld: 132 mg/dL — ABNORMAL HIGH (ref 70–99)
Phosphorus: 2.4 mg/dL — ABNORMAL LOW (ref 2.5–4.6)
Potassium: 4 mmol/L (ref 3.5–5.1)
Sodium: 136 mmol/L (ref 135–145)

## 2022-07-04 LAB — TECHNOLOGIST SMEAR REVIEW

## 2022-07-04 LAB — CULTURE, BLOOD (ROUTINE X 2)
Culture: NO GROWTH
Special Requests: ADEQUATE

## 2022-07-04 LAB — CBC WITH DIFFERENTIAL/PLATELET
Abs Immature Granulocytes: 0 10*3/uL (ref 0.00–0.07)
Basophils Absolute: 0 10*3/uL (ref 0.0–0.1)
Basophils Relative: 1 %
Eosinophils Absolute: 0.1 10*3/uL (ref 0.0–0.5)
Eosinophils Relative: 3 %
HCT: 27.6 % — ABNORMAL LOW (ref 36.0–46.0)
Hemoglobin: 9 g/dL — ABNORMAL LOW (ref 12.0–15.0)
Lymphocytes Relative: 30 %
Lymphs Abs: 0.9 10*3/uL (ref 0.7–4.0)
MCH: 28 pg (ref 26.0–34.0)
MCHC: 32.6 g/dL (ref 30.0–36.0)
MCV: 86 fL (ref 80.0–100.0)
Monocytes Absolute: 0.6 10*3/uL (ref 0.1–1.0)
Monocytes Relative: 20 %
Myelocytes: 1 %
Neutro Abs: 1.4 10*3/uL — ABNORMAL LOW (ref 1.7–7.7)
Neutrophils Relative %: 45 %
Platelets: 178 10*3/uL (ref 150–400)
RBC: 3.21 MIL/uL — ABNORMAL LOW (ref 3.87–5.11)
RDW: 14.6 % (ref 11.5–15.5)
WBC: 3 10*3/uL — ABNORMAL LOW (ref 4.0–10.5)
nRBC: 0 % (ref 0.0–0.2)
nRBC: 0 /100 WBC

## 2022-07-04 LAB — GLUCOSE, CAPILLARY
Glucose-Capillary: 128 mg/dL — ABNORMAL HIGH (ref 70–99)
Glucose-Capillary: 130 mg/dL — ABNORMAL HIGH (ref 70–99)
Glucose-Capillary: 149 mg/dL — ABNORMAL HIGH (ref 70–99)
Glucose-Capillary: 165 mg/dL — ABNORMAL HIGH (ref 70–99)

## 2022-07-04 LAB — MAGNESIUM: Magnesium: 1.9 mg/dL (ref 1.7–2.4)

## 2022-07-04 MED ORDER — ADULT MULTIVITAMIN W/MINERALS CH
1.0000 | ORAL_TABLET | Freq: Every day | ORAL | Status: DC
  Administered 2022-07-04 – 2022-07-06 (×3): 1 via ORAL
  Filled 2022-07-04 (×3): qty 1

## 2022-07-04 MED ORDER — CARVEDILOL 6.25 MG PO TABS
6.2500 mg | ORAL_TABLET | Freq: Two times a day (BID) | ORAL | Status: DC
  Administered 2022-07-04: 6.25 mg via ORAL
  Filled 2022-07-04: qty 1

## 2022-07-04 MED ORDER — AMLODIPINE BESYLATE 5 MG PO TABS
2.5000 mg | ORAL_TABLET | Freq: Two times a day (BID) | ORAL | Status: DC
  Administered 2022-07-04 – 2022-07-06 (×5): 2.5 mg via ORAL
  Filled 2022-07-04 (×5): qty 1

## 2022-07-04 MED ORDER — FUROSEMIDE 10 MG/ML IJ SOLN
20.0000 mg | Freq: Once | INTRAMUSCULAR | Status: AC
  Administered 2022-07-04: 20 mg via INTRAVENOUS
  Filled 2022-07-04: qty 2

## 2022-07-04 MED ORDER — CARVEDILOL 12.5 MG PO TABS
12.5000 mg | ORAL_TABLET | Freq: Two times a day (BID) | ORAL | Status: DC
  Administered 2022-07-04 – 2022-07-06 (×4): 12.5 mg via ORAL
  Filled 2022-07-04 (×4): qty 1

## 2022-07-04 NOTE — Progress Notes (Addendum)
HD#4 Subjective:   Summary: Denise Bush is a 72 y.o. female with pertinent PMH of ESRD s/p DDKT 01/02/2018 on chronic immunosuppression with sirolimus and belatacept infusions, T2DM, HTN, hypothyroidism, and proliferative diabetic retinopathy who presented with 2 days of nausea, vomiting, and diarrhea with intermittent confusion and fevers and is admitted for sepsis with E. coli bacteremia 2/2 UTI complicated by AKI of transplanted kidney.  Overnight Events: None  This morning the patient is still feeling fatigued but overall thinks that she is improving.  She had 2 liquid bowel movements in the last 24 hours.  She has also had some dyspnea and is on supplemental oxygen which has not been the case over the past few days.  She denies any coughing or any acute worsening in her breathing.  She notes that when she walks she gets short of breath.  She is also trying to eat more but is only eating about a third of what she usually does.  No abdominal pain, bloating, nausea, or vomiting.  Objective:  Vital signs in last 24 hours: Vitals:   07/03/22 1535 07/03/22 2104 07/04/22 0508 07/04/22 0939  BP: (!) 170/80 (!) 141/48 (!) 179/85 (!) 152/60  Pulse: 86 87 91 79  Resp: '19 18 18 18  '$ Temp: 98.8 F (37.1 C) 99 F (37.2 C) 99.4 F (37.4 C) 99.7 F (37.6 C)  TempSrc: Oral Oral Oral Oral  SpO2: 92% 90% 97% 94%  Weight:      Height:       Supplemental O2: Room Air  Physical Exam:  Constitutional: Tired appearing elderly female sitting in bedside chair. In no acute distress. Cardio:Regular rate and rhythm. Palpable thrill over LUE AVF. Pulm: Normal work of breathing on supplemental oxygen through nasal cannula.  Mildly decreased lung sounds in the bases with fine crackles, no focal abnormalities on auscultation. Abdomen:Soft, non-tender, non-distended, positive bowel sounds. MSK: Trace pitting edema lower extremities bilaterally below the knees Skin:Warm and dry.  Neuro:Alert and oriented  x3. No focal deficit noted.  Filed Weights   06/29/22 1038 06/29/22 1913 07/01/22 0233  Weight: 72.1 kg 74.2 kg 76.8 kg      Intake/Output Summary (Last 24 hours) at 07/04/2022 1144 Last data filed at 07/04/2022 0900 Gross per 24 hour  Intake 460 ml  Output 0 ml  Net 460 ml   Net IO Since Admission: 13,180.81 mL [07/04/22 1144]  Pertinent Labs:    Latest Ref Rng & Units 07/04/2022    7:13 AM 07/03/2022    6:21 AM 07/02/2022    7:20 AM  CBC  WBC 4.0 - 10.5 K/uL 3.0  3.0  2.8   Hemoglobin 12.0 - 15.0 g/dL 9.0  9.3  9.2   Hematocrit 36.0 - 46.0 % 27.6  29.6  29.4   Platelets 150 - 400 K/uL 178  155  118        Latest Ref Rng & Units 07/04/2022    7:13 AM 07/03/2022    6:21 AM 07/02/2022    7:20 AM  CMP  Glucose 70 - 99 mg/dL 132  130  159   BUN 8 - 23 mg/dL '14  19  25   '$ Creatinine 0.44 - 1.00 mg/dL 1.10  1.18  1.38   Sodium 135 - 145 mmol/L 136  136  135   Potassium 3.5 - 5.1 mmol/L 4.0  3.7  3.9   Chloride 98 - 111 mmol/L 105  102  103   CO2 22 - 32  mmol/L '25  26  26   '$ Calcium 8.9 - 10.3 mg/dL 8.7  8.8  8.6   Total Protein 6.5 - 8.1 g/dL   5.6   Total Bilirubin 0.3 - 1.2 mg/dL   0.4   Alkaline Phos 38 - 126 U/L   46   AST 15 - 41 U/L   45   ALT 0 - 44 U/L   24     Assessment/Plan:   Principal Problem:   Sepsis ([place]) Active Problems:   Hypothyroidism   Essential hypertension   Type 2 diabetes mellitus treated with insulin ([place])   Kidney replaced by transplant   Urinary tract infection   AKI (acute kidney injury) ([facility])   Nausea, vomiting, and diarrhea   High anion gap metabolic acidosis   E coli bacteremia   Colitis   Diarrhea   Abnormal CT scan, gastrointestinal tract   Patient Summary: Denise Bush is a 72 y.o. female with pertinent PMH of ESRD s/p DDKT 01/02/2018 on chronic immunosuppression with sirolimus and belatacept infusions, T2DM, HTN, hypothyroidism, and proliferative diabetic retinopathy who presented with 2 days of nausea, vomiting, and diarrhea with  intermittent confusion and fevers and is admitted for sepsis with E. coli bacteremia 2/2 UTI complicated by AKI of transplanted kidney, on hospital day 4   Sepsis with E. coli bacteremia 2/2 complicated UTI/pyelonephritis Remains afebrile for the past 3 days and continues to have symptomatic improvement most notably in energy.  She still does not feel like herself but is slowly getting better.  P.o. intake is improved but is only eating about a third of what she usually does.  Blood and urine culture of E. coli both pansensitive.  Discussed case with ID who recommend an oral option of cefadroxil 1 g twice daily and agree with a 10-day total course of antibiotics, EOT 07/08/2022.  - Continue ceftriaxone - Continue to monitor fever curve, Tylenol as needed for fever - Morning CBC and renal function panel  Dyspnea with oxygen requirement Patient has had been having dyspnea and has needed oxygen through nasal cannula at a low rate.  This may be due to the large amount of fluid we have given her.  She has an total received over 13 L of fluids since admission.  We did get a TTE which showed elevated pulmonary artery pressures and will have cardiology evaluate the patient.  We do not think there is a need to diurese at this moment but will hold off on any IV fluid boluses.  As below her renal function continues to improve.  We will get a chest x-ray to further evaluate. - Chest x-ray - Wean off supplemental oxygen as tolerated  AKI Nontraumatic rhabdomyolysis, resolved Anion gap metabolic acidosis, resolved Proteinuria Creatinine continues to improve with creatinine of 1.10 today.  - Daily renal function panel  ESRD s/p renal transplant 2019 on chronic immunosuppression Baseline creatinine of 1. S/p deceased donor kidney transplant on 01/02/2018. 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  History of BK nephropathy with consistently low viral levels while on  belatacept.  She follows with [facility] for her renal transplant and was last seen on 03/01/2022, they are aware of her admission.  On chronic immunosuppression with belatacept and sirolimus (was on mycophenolate from 12/2021 to 03/2022).   - sirolimus 2 mg daily - Appreciate nephrology's evaluation and management   Nausea, vomiting, and diarrhea with CT evidence of colitis in the ascending colon and cecum  Continues to have watery stools but these are decreasing overall with only 2 in the last 24 hours.  She is also having more p.o. intake but still has not gotten to her baseline.  She completed a 3-day course of azithromycin yesterday. - IV prochlorperazine 5 mg every 6 hours as needed for nausea - Encourage p.o. intake as tolerated  Electrolyte derangements Phosphorus continues to be low at 2.4 after getting K-Phos yesterday.  Will continue to monitor and if not improving with feeding today we will supplement tomorrow.  Magnesium 1.9 and potassium stable at 4.   Thrombocytopenia Leukopenia Platelets of 178 today, likely dilutional initially.   Type 2 diabetes A1c here of 8.3.  Lantus 23 units daily at home with sliding scale insulin. - sensitive SSI with meals and at bedtime coverage   Hypertension Blood pressure is elevated today at 179/85.  We will restart her home medications. - Carvedilol 6.25 mg twice daily and amlodipine 5 mg daily.   Hypothyroidism Continue home levothyroxine 62.5 mcg every other day.  Diet: Carb-Modified IVF: n/a VTE: Heparin Code: DNR   Dispo: Pending improvement in p.o. intake and cardiology evaluation.  Johny Blamer, DO Internal Medicine Resident PGY-1 Pager: [phone]  Please contact the on call pager after 5 pm and on weekends at [phone].

## 2022-07-04 NOTE — Progress Notes (Signed)
Patient on 2L O2 via [place].  Unable to wean O2.  Patient desats to 89% on 1L and 86-87% on room air.  Patient currently 92-94% on 2L.

## 2022-07-04 NOTE — Progress Notes (Signed)
Physical Therapy Treatment Patient Details Name: Denise Bush MRN: [id number] DOB: 10-08-1950 Today's Date: 07/04/2022   History of Present Illness Pt is a 72 yo female admitted with n/v, weakness found to have sepsis most likely from gastroenteritis and UTI.  PMH: kidney transplant 2019, ESRD, DM, HTN, legally blind.    PT Comments    Pt greeted up in chair on arrival stating increased fatigue and weakness this date secondary to poor rest overnight and increased activity in morning. Pt requesting to transfer to bed with pt able to come to stand with mod A to power up and min A to pivot to EOB with HHA for support and increased VC's secondary to low/impaired vision. Pt declining further mobility. Continued education re; importance of continued mobility and activity recommendations with pt verbalizing understanding. Pt continues to benefit from skilled PT services to progress toward functional mobility goals.    Recommendations for follow up therapy are one component of a multi-disciplinary discharge planning process, led by the attending physician.  Recommendations may be updated based on patient status, additional functional criteria and insurance authorization.  Follow Up Recommendations  Home health PT     Assistance Recommended at Discharge Frequent or constant Supervision/Assistance  Patient can return home with the following A little help with walking and/or transfers;A little help with bathing/dressing/bathroom;Assistance with cooking/housework;Assist for transportation;Help with stairs or ramp for entrance   Equipment Recommendations  Rolling walker (2 wheels)    Recommendations for Other Services       Precautions / Restrictions Precautions Precautions: Fall;Other (comment) Precaution Comments: watch O2 sats; low vision/legally blind Restrictions Weight Bearing Restrictions: No     Mobility  Bed Mobility Overal bed mobility: Needs Assistance Bed Mobility: Sit to  Supine       Sit to supine: Min assist   General bed mobility comments: Pt received sitting up in the recliner, light assist to return BLEs to bed    Transfers Overall transfer level: Needs assistance Equipment used: None Transfers: Sit to/from Stand, Bed to chair/wheelchair/BSC Sit to Stand: Mod assist Stand pivot transfers: Min assist         General transfer comment: mod A to power up to stand and, once standing min A to pivot to EOB, pt stating she feels weaker this date and with increased fatigue and needing more assist    Ambulation/Gait               General Gait Details: pt declining further mobility secondary to faitgue   Stairs             Wheelchair Mobility    Modified Rankin (Stroke Patients Only)       Balance Overall balance assessment: Needs assistance Sitting-balance support: Feet supported Sitting balance-Leahy Scale: Good     Standing balance support: Bilateral upper extremity supported, During functional activity Standing balance-Leahy Scale: Fair Standing balance comment: pt limited by general weakness from being in bed so much                            Cognition Arousal/Alertness: Awake/alert Behavior During Therapy: WFL for tasks assessed/performed Overall Cognitive Status: Within Functional Limits for tasks assessed                                          Exercises  General Comments General comments (skin integrity, edema, etc.): Pt most limited by fatigue. pt on RA on arrival with SpO2 88%, dropping to 85% on RA during transfer, placed 2L at end of session with SpO2 recovering to 94%      Pertinent Vitals/Pain Pain Assessment Pain Assessment: No/denies pain    Home Living                          Prior Function            PT Goals (current goals can now be found in the care plan section) Acute Rehab PT Goals Patient Stated Goal: "get my energy back" PT Goal  Formulation: With patient Time For Goal Achievement: 07/10/22 Progress towards PT goals: Not progressing toward goals - comment (fatigue)    Frequency    Min 3X/week      PT Plan      Co-evaluation              AM-PAC PT "6 Clicks" Mobility   Outcome Measure  Help needed turning from your back to your side while in a flat bed without using bedrails?: A Little Help needed moving from lying on your back to sitting on the side of a flat bed without using bedrails?: A Little Help needed moving to and from a bed to a chair (including a wheelchair)?: A Little Help needed standing up from a chair using your arms (e.g., wheelchair or bedside chair)?: A Lot Help needed to walk in hospital room?: A Little Help needed climbing 3-5 steps with a railing? : A Little 6 Click Score: 17    End of Session   Activity Tolerance: Patient limited by fatigue Patient left: with call bell/phone within reach;in bed;with bed alarm set Nurse Communication: Mobility status PT Visit Diagnosis: Unsteadiness on feet (R26.81);Difficulty in walking, not elsewhere classified (R26.2)     Time: [id number] PT Time Calculation (min) (ACUTE ONLY): 16 min  Charges:  $Therapeutic Activity: 8-22 mins                     Aamira Bischoff R. PTA Acute Rehabilitation Services Office: [place] 07/04/2022, 3:39 PM

## 2022-07-04 NOTE — Consult Note (Addendum)
Cardiology Consultation   Patient ID: Denise Bush MRN: [id number]; DOB: 1950-10-24  Admit date: 06/29/2022 Date of Consult: 07/04/2022  PCP:  Denise Hatchet, NP   [place] [facility] Cardiologist:  Denise Lean, MD   Patient Profile:   Denise Bush is a 72 y.o. female with a hx of ESRD s/p DDKT 01/02/18 on chronic immunosuppression with sirolimus and belatacept infusions, DM2, HTN, blindness, and hypothyroidism who is being seen 07/04/2022 for the evaluation of pulmonary hypertension at the request of Dr. Saverio Bush.  History of Present Illness:   [name] was seen in 2017 by Denise Bush for hypertension and for preoperative risk evaluation prior to kidney transplant. Echo Dec 2015 with preserved LV function, grade 1 DD, mild MR, mild LAE, and small pericardial effusion. She underwent nuclear stress test 2017 that was nonischemic. Echocardiogram at that time showed LVEF 60-65%, grade 1 DD, moderate aortic calcification, moderate to severe MAC. She has not been seen by our cardiology practice since that visit.   She presented with N/V/D, febrile, and urinary urgency found to be septic with E. Coli bacteremia secondary to complicated UTI/pyelonephritis and AKI. CT scan for nausea, vomiting, and diarrhea showed evidence of colitis in the ascending colon and cecum treated with azithromycin. GI consulted and she improved with conservative measures. She also presented with slurred speech, posterior headache, confused, and aphasic - suspected due to infection. She also reported dyspnea and has intermittently required O2. Given new O2 requirement, echocardiogram was obtained and showed LVEF 70-75%, grade 1 DD, normal RV, severely elevated PA pressure, mild MR, moderate MAC. Cardiology was consulted for new pulmonary hypertension.   During my interview, she reports moderate activity despite blindness in left eye and legal blindness in right eye. She cooks three meals per week and  can walk 1.5 blocks to [employer] to purchase a small number of items and then walks back pushing a cart. She denies chest pain, SOB, DOE, orthopnea, and lower extremity swelling. She does not feel short of breath when she has desatted and needed oxygen this admission.    Past Medical History:  Diagnosis Date   Allergy    Arthritis    Blood transfusion without reported diagnosis    Cataract    bilateral   Diabetic retinopathy ([place])    End stage renal disease on dialysis due to type 2 diabetes mellitus ([place]) 03/22/2015   ESRD (end stage renal disease) ([place])    Hashimoto's disease    Hyperlipidemia    Hyperparathyroidism, secondary renal ([place]) 06/25/2015   Hypertension    Hypothyroid    Loss of vision    both eyes, no vision left eye and little in left     Past Surgical History:  Procedure Laterality Date   ABDOMINAL HYSTERECTOMY     BREAST BIOPSY Right 2015   benign   COLONOSCOPY     EYE SURGERY     KNEE SURGERY     TOTAL KNEE ARTHROPLASTY Right 01/24/2022   Procedure: RIGHT TOTAL KNEE ARTHROPLASTY;  Surgeon: Denise Nakayama, MD;  Location: [facility];  Service: Orthopedics;  Laterality: Right;     Home Medications:  Prior to Admission medications   Medication Sig Start Date End Date Taking? Authorizing Provider  acetaminophen (TYLENOL) 500 MG tablet Take 500 mg by mouth daily as needed.   Yes [provider]  amLODipine (NORVASC) 2.5 MG tablet Take one tablet twice a day Patient taking differently: Take 2.5 mg by mouth 2 (two)  times daily. 04/29/20  Yes Denise Comber, NP  aspirin EC 81 MG tablet Take 1 tablet (81 mg total) by mouth 2 (two) times daily after a meal. For 2 weeks then back to once a day for DVT prevention. Patient taking differently: Take 81 mg by mouth daily. 01/24/22  Yes Denise Dolly, PA-C  BELATACEPT IV Inject 1 Dose into the vein every 28 (twenty-eight) days. 07/16/18  Yes [provider]  carvedilol (COREG) 6.25 MG tablet Take 6.25 mg by mouth 2  (two) times daily with a meal.   Yes [provider]  ezetimibe (ZETIA) 10 MG tablet Take 1 tablet (10 mg total) by mouth daily. 02/21/17  Yes Denise Comber, NP  gemfibrozil (LOPID) 600 MG tablet TAKE 1 TABLET TWICE A DAY WITH MEALS FOR CHOLESTEROL AND TRIGLYCERIDES Patient taking differently: Take 600 mg by mouth 2 (two) times daily. 02/16/22  Yes Denise Rossetti, NP  insulin glargine (LANTUS SOLOSTAR) 100 UNIT/ML Solostar Pen INJECT 20 UNITS INTO THE SKIN DAILY FOR DIABETES Patient taking differently: Inject 23 Units into the skin daily at 12 noon. 10/20/20  Yes Denise Pinto, MD  insulin lispro (HUMALOG) 100 UNIT/ML KwikPen Inject 3-5 Units into the skin 2 (two) times daily as needed. Inject 3-5 units up to twice daily with meals as needed for hyperglycemia. Dose dependant on fasting blood sugar and meal content. 03/30/20  Yes [provider]  ketoconazole (NIZORAL) 2 % cream Apply 1 Application topically daily as needed for irritation (redness, scaling).   Yes [provider]  levothyroxine (SYNTHROID) 125 MCG tablet Take 62.5 mcg by mouth every other day.   Yes [provider]  linagliptin (TRADJENTA) 5 MG TABS tablet Take 5 mg by mouth daily. 10/14/18  Yes [provider]  sirolimus (RAPAMUNE) 1 MG tablet Take    1 tablet     Daily     for Kidney Transplant Patient taking differently: Take 2 mg by mouth daily at 12 noon. 02/10/20  Yes Denise Pinto, MD    Inpatient Medications: Scheduled Meds:  amLODipine  2.5 mg Oral BID   carvedilol  6.25 mg Oral BID WC   enoxaparin (LOVENOX) injection  40 mg Subcutaneous Q24H   Denise Bush's butt cream   Topical Daily   insulin aspart  0-5 Units Subcutaneous QHS   insulin aspart  0-9 Units Subcutaneous TID WC   levothyroxine  62.5 mcg Oral QODAY   multivitamin with minerals  1 tablet Oral Daily   Sirolimus  2 mg Oral Q1200   Continuous Infusions:  cefTRIAXone (ROCEPHIN)  IV 2 g (07/04/22 0806)    PRN Meds: acetaminophen **OR** acetaminophen, prochlorperazine  Allergies:    Allergies  Allergen Reactions   Hydralazine Shortness Of Breath    Chest pain Fatigue    Azor [Amlodipine-Olmesartan] Other (See Comments)    Sharp chest pain     Benadryl [Diphenhydramine] Other (See Comments)    Unknown reaction   Camellia Other (See Comments)    Unknown reaction   Denaverine Other (See Comments)    Unknown reaction   Dyazide [Hydrochlorothiazide W-Triamterene] Swelling and Other (See Comments)    Chest pain Throat swelling   Glucophage [Metformin] Diarrhea, Itching and Nausea Only    Bowel incontinence    Hydrochlorothiazide Other (See Comments)    "contractions in throat"    Laureth Other (See Comments)    Unknown reaction   Prednisone Other (See Comments)    Elevated Glucose    Reglan [Metoclopramide] Other (See  Comments)    Worsening acid reflux   Trandate [Labetalol] Other (See Comments)    Foot pain   Zestril [Lisinopril] Other (See Comments)    Severe radiating foot pain.   Aldactone [Spironolactone] Rash and Other (See Comments)    Severe radiating pain in feet    Rogaine [Minoxidil] Rash and Other (See Comments)    Fatigue  Choking, throat contractions   Statins Rash    Fatigue    Social History:   Social History   Socioeconomic History   Marital status: Married    Spouse name: Not on file   Number of children: 6   Years of education: Not on file   Highest education level: Not on file  Occupational History   Not on file  Tobacco Use   Smoking status: Never   Smokeless tobacco: Never  Vaping Use   Vaping Use: Never used  Substance and Sexual Activity   Alcohol use: No    Alcohol/week: 0.0 standard drinks of alcohol   Drug use: Never   Sexual activity: Not on file  Other Topics Concern   Not on file  Social History Narrative   Not on file   Social Determinants of Health   Financial Resource Strain: Not on file  Food Insecurity: No Food  Insecurity (06/29/2022)   Hunger Vital Sign    Worried About Running Out of Food in the Last Year: Never true    Ran Out of Food in the Last Year: Never true  Transportation Needs: No Transportation Needs (06/29/2022)   PRAPARE - Hydrologist (Medical): No    Lack of Transportation (Non-Medical): No  Physical Activity: Not on file  Stress: Not on file  Social Connections: Not on file  Intimate Partner Violence: Not At Risk (06/29/2022)   Humiliation, Afraid, Rape, and Kick questionnaire    Fear of Current or Ex-Partner: No    Emotionally Abused: No    Physically Abused: No    Sexually Abused: No    Family History:    Family History  Problem Relation Age of Onset   Breast cancer Sister    CAD Other        No family history   Colon polyps Maternal Aunt    Breast cancer Maternal Aunt    Colon polyps Maternal Uncle    Breast cancer Maternal Grandmother    Breast cancer Maternal Aunt    Colon cancer Neg Hx      ROS:  Please see the history of present illness.   All other ROS reviewed and negative.     Physical Exam/Data:   Vitals:   07/03/22 2104 07/04/22 0508 07/04/22 0939 07/04/22 1352  BP: (!) 141/48 (!) 179/85 (!) 152/60   Pulse: 87 91 79   Resp: '18 18 18 20  '$ Temp: 99 F (37.2 C) 99.4 F (37.4 C) 99.7 F (37.6 C)   TempSrc: Oral Oral Oral   SpO2: 90% 97% 94% 94%  Weight:      Height:        Intake/Output Summary (Last 24 hours) at 07/04/2022 1458 Last data filed at 07/04/2022 0900 Gross per 24 hour  Intake 160 ml  Output 0 ml  Net 160 ml      07/01/2022    2:33 AM 06/29/2022    7:13 PM 06/29/2022   10:38 AM  Last 3 Weights  Weight (lbs) 169 lb 5 oz 163 lb 9.3 oz 158 lb 15.2 oz  Weight (  kg) 76.8 kg 74.2 kg 72.1 kg     Body mass index is 28.18 kg/m.  General:  Well nourished, well developed, in no acute distress HEENT: normal Neck: no JVD Vascular: No carotid bruits; Distal pulses 2+ bilaterally Cardiac:  normal S1, S2;  whistling murmur at right sternal border Lungs:  clear to auscultation bilaterally, no wheezing, rhonchi or rales  Abd: soft, nontender, no hepatomegaly  Ext: no edema Musculoskeletal:  No deformities, BUE and BLE strength normal and equal Skin: warm and dry  Neuro:  CNs 2-12 intact, no focal abnormalities noted Psych:  Normal affect   EKG:  The EKG was personally reviewed and demonstrates: SR HR 78 Telemetry:  Telemetry was personally reviewed and demonstrates:  sinus in the 80s  Relevant CV Studies:  Echo 07/03/22: 1. Left ventricular ejection fraction, by estimation, is 70 to 75%. The  left ventricle has hyperdynamic function. The left ventricle has no  regional wall motion abnormalities. There is moderate concentric left  ventricular hypertrophy. Left ventricular  diastolic parameters are consistent with Grade I diastolic dysfunction  (impaired relaxation).   2. Right ventricular systolic function is normal. The right ventricular  size is normal. There is severely elevated pulmonary artery systolic  pressure.   3. A small pericardial effusion is present.   4. MV is thickened, annulus is calcified. Mean gradient through the MV is  6 mm Hg (HR 85 bpm) MV area by continuity equation is 1.95 cm2 consistent  with mild mitral stenosis.. Mild mitral valve regurgitation. Moderate  mitral annular calcification.   5. The aortic valve is tricuspid. Aortic valve regurgitation is not  visualized.   Laboratory Data:  High Sensitivity Troponin:  No results for input(s): "TROPONINIHS" in the last 720 hours.   Chemistry Recent Labs  Lab 07/01/22 0145 07/01/22 0158 07/02/22 0720 07/03/22 0621 07/04/22 0713  NA  --    < > 135 136 136  K  --    < > 3.9 3.7 4.0  CL  --    < > 103 102 105  CO2  --    < > '26 26 25  '$ GLUCOSE  --    < > 159* 130* 132*  BUN  --    < > 25* 19 14  CREATININE  --    < > 1.38* 1.18* 1.10*  CALCIUM  --    < > 8.6* 8.8* 8.7*  MG 1.9  --  2.0  --  1.9  GFRNONAA   --    < > 41* 50* 54*  ANIONGAP  --    < > '6 8 6   '$ < > = values in this interval not displayed.    Recent Labs  Lab 06/29/22 1319 06/29/22 2056 07/02/22 0720 07/03/22 0621 07/04/22 0713  PROT 7.6  --  5.6*  --   --   ALBUMIN 3.7   < > 2.2* 2.3* 2.2*  AST 50*  --  45*  --   --   ALT 19  --  24  --   --   ALKPHOS 81  --  46  --   --   BILITOT 0.8  --  0.4  --   --    < > = values in this interval not displayed.   Lipids No results for input(s): "CHOL", "TRIG", "HDL", "LABVLDL", "LDLCALC", "CHOLHDL" in the last 168 hours.  Hematology Recent Labs  Lab 07/02/22 0720 07/03/22 0621 07/04/22 0713  WBC 2.8* 3.0* 3.0*  RBC 3.34* 3.34* 3.21*  HGB 9.2* 9.3* 9.0*  HCT 29.4* 29.6* 27.6*  MCV 88.0 88.6 86.0  MCH 27.5 27.8 28.0  MCHC 31.3 31.4 32.6  RDW 14.7 14.6 14.6  PLT 118* 155 178   Thyroid No results for input(s): "TSH", "FREET4" in the last 168 hours.  BNPNo results for input(s): "BNP", "PROBNP" in the last 168 hours.  DDimer No results for input(s): "DDIMER" in the last 168 hours.   Radiology/Studies:  DG CHEST PORT 1 VIEW  Result Date: 07/04/2022 CLINICAL DATA:  Dyspnea. EXAM: PORTABLE CHEST 1 VIEW COMPARISON:  06/29/2022. FINDINGS: 1358 hours. Low lung volumes accentuate the pulmonary vasculature and cardiomediastinal silhouette. New small bilateral pleural effusions with adjacent atelectasis in the lung bases. No pneumothorax. IMPRESSION: New small bilateral pleural effusions with adjacent atelectasis in the lung bases. Electronically Signed   By: Emmit Alexanders M.D.   On: 07/04/2022 14:10   ECHOCARDIOGRAM COMPLETE  Result Date: 07/03/2022    ECHOCARDIOGRAM REPORT   Patient Name:   Denise Bush Date of Exam: 07/03/2022 Medical Rec #:  [id number]      Height:       65.0 in Accession #:    [id number]     Weight:       169.3 lb Date of Birth:  May 14, 1950      BSA:          1.843 m Patient Age:    89 years       BP:           161/62 mmHg Patient Gender: F              HR:            85 bpm. Exam Location:  Inpatient Procedure: 2D Echo, Cardiac Doppler and Color Doppler Indications:    R01.1 Murmur  History:        Patient has prior history of Echocardiogram examinations, most                 recent 09/20/2015. Signs/Symptoms:Bacteremia; Risk                 Factors:Hypertension, Diabetes and Dyslipidemia.  Sonographer:    Roseanna Rainbow RDCS Referring Phys: [id number] [place]  1. Left ventricular ejection fraction, by estimation, is 70 to 75%. The left ventricle has hyperdynamic function. The left ventricle has no regional wall motion abnormalities. There is moderate concentric left ventricular hypertrophy. Left ventricular diastolic parameters are consistent with Grade I diastolic dysfunction (impaired relaxation).  2. Right ventricular systolic function is normal. The right ventricular size is normal. There is severely elevated pulmonary artery systolic pressure.  3. A small pericardial effusion is present.  4. MV is thickened, annulus is calcified. Mean gradient through the MV is 6 mm Hg (HR 85 bpm) MV area by continuity equation is 1.95 cm2 consistent with mild mitral stenosis.. Mild mitral valve regurgitation. Moderate mitral annular calcification.  5. The aortic valve is tricuspid. Aortic valve regurgitation is not visualized. FINDINGS  Left Ventricle: Left ventricular ejection fraction, by estimation, is 70 to 75%. The left ventricle has hyperdynamic function. The left ventricle has no regional wall motion abnormalities. The left ventricular internal cavity size was normal in size. There is moderate concentric left ventricular hypertrophy. Left ventricular diastolic parameters are consistent with Grade I diastolic dysfunction (impaired relaxation). Right Ventricle: The right ventricular size is normal. Right vetricular wall thickness was not assessed. Right ventricular systolic function is normal. There is  severely elevated pulmonary artery systolic pressure. The tricuspid  regurgitant velocity is 3.64 m/s, and with an assumed right atrial pressure of 15 mmHg, the estimated right ventricular systolic pressure is [id number] mmHg. Left Atrium: Left atrial size was normal in size. Right Atrium: Right atrial size was normal in size. Pericardium: A small pericardial effusion is present. Mitral Valve: MV is thickened, annulus is calcified. Mean gradient through the MV is 6 mm Hg (HR 85 bpm) MV area by continuity equation is 1.95 cm2 consistent with mild mitral stenosis. There is mild thickening of the mitral valve leaflet(s). Moderate mitral annular calcification. Mild mitral valve regurgitation. MV peak gradient, 14.7 mmHg. The mean mitral valve gradient is 6.5 mmHg. Tricuspid Valve: The tricuspid valve is normal in structure. Tricuspid valve regurgitation is mild. Aortic Valve: The aortic valve is tricuspid. Aortic valve regurgitation is not visualized. Aortic valve mean gradient measures 7.0 mmHg. Aortic valve peak gradient measures 13.0 mmHg. Aortic valve area, by VTI measures 2.92 cm. Pulmonic Valve: The pulmonic valve was normal in structure. Pulmonic valve regurgitation is trivial. Aorta: The aortic root and ascending aorta are structurally normal, with no evidence of dilitation. IAS/Shunts: No atrial level shunt detected by color flow Doppler.  LEFT VENTRICLE PLAX 2D LVIDd:         3.80 cm      Diastology LVIDs:         2.20 cm      LV e' medial:    4.68 cm/s LV PW:         1.50 cm      LV E/e' medial:  33.8 LV IVS:        1.55 cm      LV e' lateral:   7.29 cm/s LVOT diam:     2.30 cm      LV E/e' lateral: 21.7 LV SV:         95 LV SV Index:   52 LVOT Area:     4.15 cm  LV Volumes (MOD) LV vol d, MOD A2C: 120.0 ml LV vol d, MOD A4C: 105.0 ml LV vol s, MOD A2C: 44.8 ml LV vol s, MOD A4C: 42.9 ml LV SV MOD A2C:     75.2 ml LV SV MOD A4C:     105.0 ml LV SV MOD BP:      67.3 ml RIGHT VENTRICLE             IVC RV S prime:     10.60 cm/s  IVC diam: 1.90 cm TAPSE (M-mode): 1.5 cm LEFT ATRIUM              Index        RIGHT ATRIUM           Index LA diam:        3.50 cm 1.90 cm/m   RA Area:     16.30 cm LA Vol (A2C):   57.6 ml 31.25 ml/m  RA Volume:   46.30 ml  25.12 ml/m LA Vol (A4C):   35.6 ml 19.32 ml/m LA Biplane Vol: 47.1 ml 25.56 ml/m  AORTIC VALVE AV Area (Vmax):    2.86 cm AV Area (Vmean):   2.75 cm AV Area (VTI):     2.92 cm AV Vmax:           180.00 cm/s AV Vmean:          126.000 cm/s AV VTI:            0.326  m AV Peak Grad:      13.0 mmHg AV Mean Grad:      7.0 mmHg LVOT Vmax:         124.00 cm/s LVOT Vmean:        83.500 cm/s LVOT VTI:          0.229 m LVOT/AV VTI ratio: 0.70  AORTA Ao Root diam: 2.70 cm Ao Asc diam:  2.90 cm MITRAL VALVE                TRICUSPID VALVE MV Area (PHT): 4.06 cm     TR Peak grad:   53.0 mmHg MV Area VTI:   1.95 cm     TR Vmax:        364.00 cm/s MV Peak grad:  14.7 mmHg MV Mean grad:  6.5 mmHg     SHUNTS MV Vmax:       1.92 m/s     Systemic VTI:  0.23 m MV Vmean:      120.0 cm/s   Systemic Diam: 2.30 cm MV Decel Time: 187 msec MV E velocity: 158.00 cm/s MV A velocity: 177.00 cm/s MV E/A ratio:  0.89 Dorris Carnes MD Electronically signed by Dorris Carnes MD Signature Date/Time: 07/03/2022/6:24:20 PM    Final    CT ABDOMEN PELVIS WO CONTRAST  Result Date: 07/01/2022 CLINICAL DATA:  Diarrhea. EXAM: CT ABDOMEN AND PELVIS WITHOUT CONTRAST TECHNIQUE: Multidetector CT imaging of the abdomen and pelvis was performed following the standard protocol without IV contrast. RADIATION DOSE REDUCTION: This exam was performed according to the departmental dose-optimization program which includes automated exposure control, adjustment of the mA and/or kV according to patient size and/or use of iterative reconstruction technique. COMPARISON:  07/07/2015. FINDINGS: Lower chest: The heart is enlarged and there is a trace pericardial effusion. There is calcification of the mitral valve annulus. Multi-vessel coronary artery calcifications are noted. There are small bilateral  pleural effusions with mild atelectasis or scarring at the lung bases. Hepatobiliary: No focal liver abnormality is seen. No gallstones, gallbladder wall thickening, or biliary dilatation. Pancreas: Unremarkable. No pancreatic ductal dilatation or surrounding inflammatory changes. Spleen: Normal in size without focal abnormality. Adrenals/Urinary Tract: The adrenal glands are within normal limits. Renal atrophy and cortical thinning are noted bilaterally in the native kidney. Calcifications are present at the renal hila bilaterally, possible renal calculi versus vascular calcifications. No hydronephrosis. The bladder is unremarkable. A transplant kidney is noted in the right iliac fossa with mild surrounding fat stranding. No renal calculus or hydronephrosis at the renal transplant. Stomach/Bowel: Stomach is within normal limits. Appendix appears normal. No bowel obstruction, free air or pneumatosis. Air-fluid levels are noted throughout the colon, compatible with history of diarrheal illness. There is mild bowel wall thickening involving the cecum and ascending colon. Vascular/Lymphatic: Aortic atherosclerosis. No enlarged abdominal or pelvic lymph nodes. Reproductive: Status post hysterectomy. No adnexal masses. Other: Mild mesenteric fat stranding with a small amount of free fluid in the right pericolic gutter and pelvis. Small fat containing umbilical hernia. Anasarca is noted. Musculoskeletal: Degenerative changes are present in the thoracolumbar spine. No acute osseous abnormality. IMPRESSION: 1. Air-fluid levels in the colon, compatible with history of diarrhea. 2. Mild bowel wall thickening involving the cecum and ascending colon, possible colitis. 3. Right renal fossa transplant kidney. Perinephric fat stranding is noted in the region which may be infectious or inflammatory. 4. Atrophy and cortical thinning involving the native kidneys. Possible renal calculi versus vascular calcifications. 5. Small  bilateral  pleural effusions with atelectasis at the lung bases. 6. Aortic atherosclerosis. 7. Cardiomegaly with small pericardial effusion with coronary artery calcifications. 8. Anasarca. Electronically Signed   By: Brett Fairy M.D.   On: 07/01/2022 20:16   US Renal Transplant w/Doppler  Result Date: 06/30/2022 CLINICAL DATA:  Acute kidney injury EXAM: ULTRASOUND OF RENAL TRANSPLANT WITH RENAL DOPPLER ULTRASOUND TECHNIQUE: Ultrasound examination of the renal transplant was performed with gray-scale, color and duplex doppler evaluation. COMPARISON:  None Available. FINDINGS: Transplant kidney location: RLQ Transplant Kidney: Renal measurements: 10.5 x 6.6 x 7.2 cm = volume: 235m. Normal in size and parenchymal echogenicity. No evidence of mass or hydronephrosis. No peri-transplant fluid collection seen. Color flow in the main renal artery:  Yes Color flow in the main renal vein:  Yes Duplex Doppler Evaluation: Main Renal Artery Velocity: 86 cm/sec Main Renal Artery Resistive Index: 0.8 Venous waveform in main renal vein:  Present Intrarenal resistive index in upper pole:  0.8 (normal 0.6-0.8; equivocal 0.8-0.9; abnormal >= 0.9) Intrarenal resistive index in lower pole: 0.76 (normal 0.6-0.8; equivocal 0.8-0.9; abnormal >= 0.9) Bladder: Normal for degree of bladder distention. Ureteral jets are not seen. Other findings: Small amount of free fluid in the right lower quadrant. IMPRESSION: 1. Normal sonographic appearance of the transplant kidney. 2. Small amount of free fluid in the right lower quadrant. Electronically Signed   By: ARonney AstersM.D.   On: 06/30/2022 16:48     Assessment and Plan:   Pulmonary hypertension Mild mitral stenosis, mild MR, moderate MAC Echo with severely elevated pulmonary artery pressure Pt has had intermittent O2 requirement this hospitalization No right heart strain on echo, no D- dimer collected, lower suspicion for PE - she has been hydrated for poor PO intake and  diarrhea - will give 1 dose of 20 mg IV lasix and monitor OP - can consider right heart cath to further evaluate if needed in the future - will increase coreg to 12.5 mg BID   Moderate LVH Grade 1 DD Does not appear volume up   E. Coli bacteremia No signs of vegetation on TTE Consider TEE to rule out vegetation - will discuss with attending   DM with hyperglycemia  A1c 8.3% Per primary   AKI sCr peaked at 1.84, now improved to 1.10   ESRD now s/p renal transplant In 2019, secondary to DM and HTN On immunosuppression   Anemia Hb trending down to 9.0, was 13.5 at admission Per primary   Risk Assessment/Risk Scores:        For questions or updates, please contact [place]Please consult [email or web address] for contact info under    Signed, ALedora Bottcher PA  07/04/2022 2:58 PM  Personally seen and examined. Agree with APP above with the following comments:  Briefly 72yo F with ESRD (HD for 5 years then DDKT for ~ 5 years) on immunosuppression with HTN, DM, with new E coli bacteremia, AKI, and pyelonephritis.  S/p 13 L + (unclear I/Os) patient has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

## 2022-07-04 NOTE — TOC Progression Note (Signed)
Transition of Care [facility]) - Progression Note    Patient Details  Name: Denise Bush MRN: [id number] Date of Birth: June 27, 1950  Transition of Care [facility]) CM/SW Contact  Tom-Johnson, Renea Ee, RN Phone Number: 07/04/2022, 2:16 PM  Clinical Narrative:     CM spoke with patient and husband, Althia Forts at bedside about Home Health recommendations.  Patient continues on IV abx for sepsis 2/2 UTI and also found to have AKI. Patient has hx of Kidney transplant in 2019 and followed by [facility]. Cardiology following for Pulmonary Hypertension. Patient is legally blind. Husband transports to and from appointments and primary caregiver at home.    Patient states she was active with [facility] and would like to use their services again at discharge. CM called in referral to [facility] with acceptance voiced, info on AVS.  PCP is Willene Hatchet, NP and uses [facility] pharmacy as well as [facility].  CM will continue to follow as patient progresses with care towards discharge.         Expected Discharge Plan: [place] Barriers to Discharge: Continued Medical Work up  Expected Discharge Plan and Services   Discharge Planning Services: CM Consult Post Acute Care Choice: [place] arrangements for the past 2 months: [place]: PT [place]: [place] Date [place]: 07/04/22 Time HH Agency Contacted: 1400 Representative spoke with at [place]: [place] ([place]) Interventions SDOH Screenings   Food Insecurity: No Food Insecurity (06/29/2022)  Housing: Low Risk  (06/29/2022)  Transportation Needs: No Transportation Needs (06/29/2022)  Utilities: Not At Risk (06/29/2022)  Depression (PHQ2-9): Low Risk  (10/06/2020)  Tobacco Use: Low Risk  (06/29/2022)    Readmission Risk Interventions     No data to display

## 2022-07-04 NOTE — Progress Notes (Signed)
Mobility Specialist Progress Note   07/04/22 1150  Mobility  Activity Ambulated with assistance in hallway  Level of Assistance Minimal assist, patient does 75% or more  Assistive Device Front wheel walker  Distance Ambulated (ft) 180 ft (120+40+20)  Activity Response Tolerated well  Mobility Referral Yes  $Mobility charge 1 Mobility   Pre Mobility: 87 HR, 91% SpO2 on 2LO2 During Mobility:  102 HR, 94% SpO2 on 3LO2 Post Mobility: 84 HR, 92% SpO2 on 2LO2  Received pt in chair c/o overall tiredness but agreeable to mobility. Able to stand and ambulate w/ minA w/ the need of constant verbal cues d/t visual deficit. X2 seated rest breaks d/t increased fatigue, SpO2 desat to 85% SpO2 while on 2LO2, required 3L/min of supplemental O2, per advisement of RN. Pt recovered w/ PLB and able to maintain >90% SpO2. Returned back to chair w/o fault call bell in reach and food tray in front.   [name] Mobility Specialist Please contact via SecureChat or  Rehab office at [phone]

## 2022-07-05 ENCOUNTER — Ambulatory Visit: Payer: Medicare PPO

## 2022-07-05 DIAGNOSIS — R7881 Bacteremia: Secondary | ICD-10-CM | POA: Diagnosis not present

## 2022-07-05 DIAGNOSIS — B9620 Unspecified Escherichia coli [E. coli] as the cause of diseases classified elsewhere: Secondary | ICD-10-CM | POA: Diagnosis not present

## 2022-07-05 LAB — CBC WITH DIFFERENTIAL/PLATELET
Abs Immature Granulocytes: 0 10*3/uL (ref 0.00–0.07)
Band Neutrophils: 2 %
Basophils Absolute: 0.1 10*3/uL (ref 0.0–0.1)
Basophils Relative: 3 %
Eosinophils Absolute: 0.2 10*3/uL (ref 0.0–0.5)
Eosinophils Relative: 7 %
HCT: 27.7 % — ABNORMAL LOW (ref 36.0–46.0)
Hemoglobin: 8.9 g/dL — ABNORMAL LOW (ref 12.0–15.0)
Lymphocytes Relative: 33 %
Lymphs Abs: 0.7 10*3/uL (ref 0.7–4.0)
MCH: 28.3 pg (ref 26.0–34.0)
MCHC: 32.1 g/dL (ref 30.0–36.0)
MCV: 87.9 fL (ref 80.0–100.0)
Monocytes Absolute: 0.5 10*3/uL (ref 0.1–1.0)
Monocytes Relative: 21 %
Neutro Abs: 0.8 10*3/uL — ABNORMAL LOW (ref 1.7–7.7)
Neutrophils Relative %: 34 %
Platelets: 200 10*3/uL (ref 150–400)
RBC: 3.15 MIL/uL — ABNORMAL LOW (ref 3.87–5.11)
RDW: 14.8 % (ref 11.5–15.5)
WBC: 2.2 10*3/uL — ABNORMAL LOW (ref 4.0–10.5)
nRBC: 0 % (ref 0.0–0.2)

## 2022-07-05 LAB — GLUCOSE, CAPILLARY
Glucose-Capillary: 143 mg/dL — ABNORMAL HIGH (ref 70–99)
Glucose-Capillary: 132 mg/dL — ABNORMAL HIGH (ref 70–99)
Glucose-Capillary: 158 mg/dL — ABNORMAL HIGH (ref 70–99)
Glucose-Capillary: 133 mg/dL — ABNORMAL HIGH (ref 70–99)

## 2022-07-05 LAB — RENAL FUNCTION PANEL
Albumin: 2.2 g/dL — ABNORMAL LOW (ref 3.5–5.0)
Anion gap: 9 (ref 5–15)
BUN: 15 mg/dL (ref 8–23)
CO2: 23 mmol/L (ref 22–32)
Calcium: 8.7 mg/dL — ABNORMAL LOW (ref 8.9–10.3)
Chloride: 106 mmol/L (ref 98–111)
Creatinine, Ser: 1.04 mg/dL — ABNORMAL HIGH (ref 0.44–1.00)
GFR, Estimated: 58 mL/min — ABNORMAL LOW (ref >60)
Glucose, Bld: 156 mg/dL — ABNORMAL HIGH (ref 70–99)
Phosphorus: 2.6 mg/dL (ref 2.5–4.6)
Potassium: 3.8 mmol/L (ref 3.5–5.1)
Sodium: 138 mmol/L (ref 135–145)

## 2022-07-05 LAB — RETICULOCYTES
Immature Retic Fract: 14 % (ref 2.3–15.9)
RBC.: 3.25 MIL/uL — ABNORMAL LOW (ref 3.87–5.11)
Retic Count, Absolute: 29.6 10*3/uL (ref 19.0–186.0)
Retic Ct Pct: 0.9 % (ref 0.4–3.1)

## 2022-07-05 LAB — IRON AND TIBC
Iron: 12 ug/dL — ABNORMAL LOW (ref 28–170)
Saturation Ratios: 9 % — ABNORMAL LOW (ref 10.4–31.8)
TIBC: 127 ug/dL — ABNORMAL LOW (ref 250–450)
UIBC: 115 ug/dL

## 2022-07-05 LAB — FERRITIN: Ferritin: 1324 ng/mL — ABNORMAL HIGH (ref 11–307)

## 2022-07-05 LAB — CBC
HCT: 27.9 % — ABNORMAL LOW (ref 36.0–46.0)
Hemoglobin: 8.5 g/dL — ABNORMAL LOW (ref 12.0–15.0)
MCH: 27.1 pg (ref 26.0–34.0)
MCHC: 30.5 g/dL (ref 30.0–36.0)
MCV: 88.9 fL (ref 80.0–100.0)
Platelets: 174 10*3/uL (ref 150–400)
RBC: 3.14 MIL/uL — ABNORMAL LOW (ref 3.87–5.11)
RDW: 14.9 % (ref 11.5–15.5)
WBC: 2.4 10*3/uL — ABNORMAL LOW (ref 4.0–10.5)
nRBC: 0 % (ref 0.0–0.2)

## 2022-07-05 MED ORDER — FUROSEMIDE 10 MG/ML IJ SOLN
40.0000 mg | Freq: Two times a day (BID) | INTRAMUSCULAR | Status: AC
  Administered 2022-07-05 – 2022-07-06 (×2): 40 mg via INTRAVENOUS
  Filled 2022-07-05 (×2): qty 4

## 2022-07-05 MED ORDER — LOPERAMIDE HCL 2 MG PO CAPS
2.0000 mg | ORAL_CAPSULE | ORAL | Status: DC | PRN
  Administered 2022-07-05 – 2022-07-06 (×2): 2 mg via ORAL
  Filled 2022-07-05 (×3): qty 1

## 2022-07-05 MED ORDER — FUROSEMIDE 10 MG/ML IJ SOLN
40.0000 mg | Freq: Once | INTRAMUSCULAR | Status: AC
  Administered 2022-07-05: 40 mg via INTRAVENOUS
  Filled 2022-07-05: qty 4

## 2022-07-05 MED ORDER — CEFADROXIL 500 MG PO CAPS
1000.0000 mg | ORAL_CAPSULE | Freq: Two times a day (BID) | ORAL | Status: DC
  Administered 2022-07-05 – 2022-07-06 (×3): 1000 mg via ORAL
  Filled 2022-07-05 (×3): qty 2

## 2022-07-05 NOTE — Progress Notes (Signed)
Progress Note  Patient Name: Denise Bush Date of Encounter: 07/05/2022  Primary Cardiologist: Werner Lean, MD   Subjective   Weaned off O2 this AM.  Feels back to normal No urine output recorded.  Patient notes diuresis and is off O2.  Inpatient Medications    Scheduled Meds:  amLODipine  2.5 mg Oral BID   carvedilol  12.5 mg Oral BID WC   cefadroxil  1,000 mg Oral BID   enoxaparin (LOVENOX) injection  40 mg Subcutaneous Q24H   Gerhardt's butt cream   Topical Daily   insulin aspart  0-5 Units Subcutaneous QHS   insulin aspart  0-9 Units Subcutaneous TID WC   levothyroxine  62.5 mcg Oral QODAY   multivitamin with minerals  1 tablet Oral Daily   Sirolimus  2 mg Oral Q1200   Continuous Infusions:  PRN Meds: acetaminophen **OR** acetaminophen, prochlorperazine   Vital Signs    Vitals:   07/04/22 2104 07/05/22 0455 07/05/22 0800 07/05/22 0817  BP: (!) 139/54 129/60  133/63  Pulse: 81 86  78  Resp: '18 18  17  '$ Temp: 99.2 F (37.3 C) 100.2 F (37.9 C) 98.2 F (36.8 C) 98.2 F (36.8 C)  TempSrc: Oral Oral Oral Oral  SpO2: 95% 100%  97%  Weight:      Height:        Intake/Output Summary (Last 24 hours) at 07/05/2022 1009 Last data filed at 07/05/2022 0900 Gross per 24 hour  Intake 680 ml  Output 0 ml  Net 680 ml   Filed Weights   06/29/22 1038 06/29/22 1913 07/01/22 0233  Weight: 72.1 kg 74.2 kg 76.8 kg    Telemetry    SR with 4 beat run of NSVT - Personally Reviewed  ECG    No new - Personally Reviewed  Physical Exam   Gen: no distress,   Neck: No JVD  Cardiac: No Rubs or Gallops, predominantly systolic murmur, RRR +2 radial pulses Respiratory: Bibasilar crackles normal effort, normal  respiratory rate GI: Soft, nontender, non-distended  MS: Non pitting edema;  moves all extremities Integument: Skin feels warm Neuro:  At time of evaluation, alert and oriented to person/place/time/situation  Psych: Normal affect, patient feels  well   Labs    Chemistry Recent Labs  Lab 06/29/22 1319 06/29/22 2056 07/02/22 0720 07/03/22 0621 07/04/22 0713 07/05/22 0624  NA 141   < > 135 136 136 138  K 4.3   < > 3.9 3.7 4.0 3.8  CL 107   < > 103 102 105 106  CO2 15*   < > '26 26 25 23  '$ GLUCOSE 142*   < > 159* 130* 132* 156*  BUN 31*   < > 25* '19 14 15  '$ CREATININE 1.62*   < > 1.38* 1.18* 1.10* 1.04*  CALCIUM 9.7   < > 8.6* 8.8* 8.7* 8.7*  PROT 7.6  --  5.6*  --   --   --   ALBUMIN 3.7   < > 2.2* 2.3* 2.2* 2.2*  AST 50*  --  45*  --   --   --   ALT 19  --  24  --   --   --   ALKPHOS 81  --  46  --   --   --   BILITOT 0.8  --  0.4  --   --   --   GFRNONAA 34*   < > 41* 50* 54* 58*  ANIONGAP  19*   < > '6 8 6 9   '$ < > = values in this interval not displayed.     Hematology Recent Labs  Lab 07/03/22 0621 07/04/22 0713 07/05/22 0624  WBC 3.0* 3.0* 2.4*  RBC 3.34* 3.21* 3.14*  HGB 9.3* 9.0* 8.5*  HCT 29.6* 27.6* 27.9*  MCV 88.6 86.0 88.9  MCH 27.8 28.0 27.1  MCHC 31.4 32.6 30.5  RDW 14.6 14.6 14.9  PLT 155 178 174    Cardiac EnzymesNo results for input(s): "TROPONINI" in the last 168 hours. No results for input(s): "TROPIPOC" in the last 168 hours.   BNPNo results for input(s): "BNP", "PROBNP" in the last 168 hours.   DDimer No results for input(s): "DDIMER" in the last 168 hours.   Radiology    DG CHEST PORT 1 VIEW  Result Date: 07/04/2022 CLINICAL DATA:  Dyspnea. EXAM: PORTABLE CHEST 1 VIEW COMPARISON:  06/29/2022. FINDINGS: 1358 hours. Low lung volumes accentuate the pulmonary vasculature and cardiomediastinal silhouette. New small bilateral pleural effusions with adjacent atelectasis in the lung bases. No pneumothorax. IMPRESSION: New small bilateral pleural effusions with adjacent atelectasis in the lung bases. Electronically Signed   By: Emmit Alexanders M.D.   On: 07/04/2022 14:10   ECHOCARDIOGRAM COMPLETE  Result Date: 07/03/2022    ECHOCARDIOGRAM REPORT   Patient Name:   Denise Bush Date of Exam:  07/03/2022 Medical Rec #:  [id number]      Height:       65.0 in Accession #:    [id number]     Weight:       169.3 lb Date of Birth:  [birth year]      BSA:          1.843 m Patient Age:    72 years       BP:           161/62 mmHg Patient Gender: F              HR:           85 bpm. Exam Location:  Inpatient Procedure: 2D Echo, Cardiac Doppler and Color Doppler Indications:    R01.1 Murmur  History:        Patient has prior history of Echocardiogram examinations, most                 recent 09/20/2015. Signs/Symptoms:Bacteremia; Risk                 Factors:Hypertension, Diabetes and Dyslipidemia.  Sonographer:    Roseanna Rainbow RDCS Referring Phys: [id number] Leonard  1. Left ventricular ejection fraction, by estimation, is 70 to 75%. The left ventricle has hyperdynamic function. The left ventricle has no regional wall motion abnormalities. There is moderate concentric left ventricular hypertrophy. Left ventricular diastolic parameters are consistent with Grade I diastolic dysfunction (impaired relaxation).  2. Right ventricular systolic function is normal. The right ventricular size is normal. There is severely elevated pulmonary artery systolic pressure.  3. A small pericardial effusion is present.  4. MV is thickened, annulus is calcified. Mean gradient through the MV is 6 mm Hg (HR 85 bpm) MV area by continuity equation is 1.95 cm2 consistent with mild mitral stenosis.. Mild mitral valve regurgitation. Moderate mitral annular calcification.  5. The aortic valve is tricuspid. Aortic valve regurgitation is not visualized. FINDINGS  Left Ventricle: Left ventricular ejection fraction, by estimation, is 70 to 75%. The left ventricle has hyperdynamic function. The left ventricle has no regional wall  motion abnormalities. The left ventricular internal cavity size was normal in size. There is moderate concentric left ventricular hypertrophy. Left ventricular diastolic parameters are consistent with Grade I  diastolic dysfunction (impaired relaxation). Right Ventricle: The right ventricular size is normal. Right vetricular wall thickness was not assessed. Right ventricular systolic function is normal. There is severely elevated pulmonary artery systolic pressure. The tricuspid regurgitant velocity is 3.64 m/s, and with an assumed right atrial pressure of 15 mmHg, the estimated right ventricular systolic pressure is [id number] mmHg. Left Atrium: Left atrial size was normal in size. Right Atrium: Right atrial size was normal in size. Pericardium: A small pericardial effusion is present. Mitral Valve: MV is thickened, annulus is calcified. Mean gradient through the MV is 6 mm Hg (HR 85 bpm) MV area by continuity equation is 1.95 cm2 consistent with mild mitral stenosis. There is mild thickening of the mitral valve leaflet(s). Moderate mitral annular calcification. Mild mitral valve regurgitation. MV peak gradient, 14.7 mmHg. The mean mitral valve gradient is 6.5 mmHg. Tricuspid Valve: The tricuspid valve is normal in structure. Tricuspid valve regurgitation is mild. Aortic Valve: The aortic valve is tricuspid. Aortic valve regurgitation is not visualized. Aortic valve mean gradient measures 7.0 mmHg. Aortic valve peak gradient measures 13.0 mmHg. Aortic valve area, by VTI measures 2.92 cm. Pulmonic Valve: The pulmonic valve was normal in structure. Pulmonic valve regurgitation is trivial. Aorta: The aortic root and ascending aorta are structurally normal, with no evidence of dilitation. IAS/Shunts: No atrial level shunt detected by color flow Doppler.  LEFT VENTRICLE PLAX 2D LVIDd:         3.80 cm      Diastology LVIDs:         2.20 cm      LV e' medial:    4.68 cm/s LV PW:         1.50 cm      LV E/e' medial:  33.8 LV IVS:        1.55 cm      LV e' lateral:   7.29 cm/s LVOT diam:     2.30 cm      LV E/e' lateral: 21.7 LV SV:         95 LV SV Index:   52 LVOT Area:     4.15 cm  LV Volumes (MOD) LV vol d, MOD A2C: 120.0 ml LV  vol d, MOD A4C: 105.0 ml LV vol s, MOD A2C: 44.8 ml LV vol s, MOD A4C: 42.9 ml LV SV MOD A2C:     75.2 ml LV SV MOD A4C:     105.0 ml LV SV MOD BP:      67.3 ml RIGHT VENTRICLE             IVC RV S prime:     10.60 cm/s  IVC diam: 1.90 cm TAPSE (M-mode): 1.5 cm LEFT ATRIUM             Index        RIGHT ATRIUM           Index LA diam:        3.50 cm 1.90 cm/m   RA Area:     16.30 cm LA Vol (A2C):   57.6 ml 31.25 ml/m  RA Volume:   46.30 ml  25.12 ml/m LA Vol (A4C):   35.6 ml 19.32 ml/m LA Biplane Vol: 47.1 ml 25.56 ml/m  AORTIC VALVE AV Area (Vmax):    2.86 cm AV Area (  Vmean):   2.75 cm AV Area (VTI):     2.92 cm AV Vmax:           180.00 cm/s AV Vmean:          126.000 cm/s AV VTI:            0.326 m AV Peak Grad:      13.0 mmHg AV Mean Grad:      7.0 mmHg LVOT Vmax:         124.00 cm/s LVOT Vmean:        83.500 cm/s LVOT VTI:          0.229 m LVOT/AV VTI ratio: 0.70  AORTA Ao Root diam: 2.70 cm Ao Asc diam:  2.90 cm MITRAL VALVE                TRICUSPID VALVE MV Area (PHT): 4.06 cm     TR Peak grad:   53.0 mmHg MV Area VTI:   1.95 cm     TR Vmax:        364.00 cm/s MV Peak grad:  14.7 mmHg MV Mean grad:  6.5 mmHg     SHUNTS MV Vmax:       1.92 m/s     Systemic VTI:  0.23 m MV Vmean:      120.0 cm/s   Systemic Diam: 2.30 cm MV Decel Time: 187 msec MV E velocity: 158.00 cm/s MV A velocity: 177.00 cm/s MV E/A ratio:  0.89 Dorris Carnes MD Electronically signed by Dorris Carnes MD Signature Date/Time: 07/03/2022/6:24:20 PM    Final      Patient Profile     72 y.o. female Sepsis and E Coli Bacteremia with diarrhea that is improving and SOB after volume resuscitation  Assessment & Plan    Mild Calcific Mitral Stenosis ESRD with distant renal transplant AKI Sepsis requiring significant volume resuscitation - would get strict I'Os and daily weights - increase to 40 IV lasix BID (2 doses ordered) - Mg level ordered for 07/06/22 - we have increased BB (07/04/22) - agree with IS  HTN - BB as above, norvasc  is fine  Potential 07/06/22 transition to PO diuretics  For questions or updates, please contact [facility] Please consult [email or web address] for contact info under Cardiology/STEMI.      Rudean Haskell, MD FASE [place], [address] [place], [place] [address] [phone]  10:09 AM

## 2022-07-05 NOTE — Progress Notes (Addendum)
HD#5 Subjective:   Summary: Denise Bush is a 72 y.o. female with pertinent PMH of ESRD s/p DDKT 01/02/2018 on chronic immunosuppression with sirolimus and belatacept infusions, T2DM, HTN, hypothyroidism, and proliferative diabetic retinopathy who presented with 2 days of nausea, vomiting, and diarrhea with intermittent confusion and fevers and is admitted for sepsis with E. coli bacteremia 2/2 UTI complicated by AKI of transplanted kidney.  Overnight Events: None  Patient is feeling a little fatigued this morning but had been feeling well yesterday afternoon and near baseline.  She is eating a lot better without nausea, vomiting, or abdominal pain.  She has not had any more liquid bowel movements since yesterday morning.  She is wearing her oxygen right now but denies any dyspnea even while walking down the hall.  Objective:  Vital signs in last 24 hours: Vitals:   07/04/22 2104 07/05/22 0455 07/05/22 0800 07/05/22 0817  BP: (!) 139/54 129/60  133/63  Pulse: 81 86  78  Resp: '18 18  17  '$ Temp: 99.2 F (37.3 C) 100.2 F (37.9 C) 98.2 F (36.8 C) 98.2 F (36.8 C)  TempSrc: Oral Oral Oral Oral  SpO2: 95% 100%  97%  Weight:      Height:       Supplemental O2: Room Air  Physical Exam:  Constitutional: Mildly tired appearing elderly female sitting in bedside chair. In no acute distress. Cardio:Regular rate and rhythm. Palpable thrill over LUE AVF. Pulm: Normal work of breathing on room air.  Mildly decreased lung sounds in the bases with fine crackles, no focal abnormalities on auscultation. MSK: Trace pitting edema lower extremities bilaterally below the knees Skin:Warm and dry.  Neuro:Alert and oriented x3. No focal deficit noted.  Filed Weights   06/29/22 1038 06/29/22 1913 07/01/22 0233  Weight: 72.1 kg 74.2 kg 76.8 kg      Intake/Output Summary (Last 24 hours) at 07/05/2022 1106 Last data filed at 07/05/2022 0900 Gross per 24 hour  Intake 680 ml  Output 0 ml  Net 680 ml    Net IO Since Admission: 13,860.81 mL [07/05/22 1106]  Pertinent Labs:    Latest Ref Rng & Units 07/05/2022    6:24 AM 07/04/2022    7:13 AM 07/03/2022    6:21 AM  CBC  WBC 4.0 - 10.5 K/uL 2.4  3.0  3.0   Hemoglobin 12.0 - 15.0 g/dL 8.5  9.0  9.3   Hematocrit 36.0 - 46.0 % 27.9  27.6  29.6   Platelets 150 - 400 K/uL 174  178  155        Latest Ref Rng & Units 07/05/2022    6:24 AM 07/04/2022    7:13 AM 07/03/2022    6:21 AM  CMP  Glucose 70 - 99 mg/dL 156  132  130   BUN 8 - 23 mg/dL '15  14  19   '$ Creatinine 0.44 - 1.00 mg/dL 1.04  1.10  1.18   Sodium 135 - 145 mmol/L 138  136  136   Potassium 3.5 - 5.1 mmol/L 3.8  4.0  3.7   Chloride 98 - 111 mmol/L 106  105  102   CO2 22 - 32 mmol/L '23  25  26   '$ Calcium 8.9 - 10.3 mg/dL 8.7  8.7  8.8     Assessment/Plan:   Principal Problem:   E coli bacteremia Active Problems:   Hypothyroidism   Hypertension   Type 2 diabetes mellitus treated with insulin ([place])  Kidney transplant recipient   Sepsis [facility])   Complicated UTI (urinary tract infection)   AKI (acute kidney injury) ([place])   Nausea, vomiting, and diarrhea   High anion gap metabolic acidosis   Colitis   Diarrhea   Abnormal CT scan, gastrointestinal tract   Patient Summary: Denise Bush is a 72 y.o. female with pertinent PMH of ESRD s/p DDKT 01/02/2018 on chronic immunosuppression with sirolimus and belatacept infusions, T2DM, HTN, hypothyroidism, and proliferative diabetic retinopathy who presented with 2 days of nausea, vomiting, and diarrhea with intermittent confusion and fevers and is admitted for sepsis with E. coli bacteremia 2/2 UTI complicated by AKI of transplanted kidney, on hospital day 5   Sepsis with E. coli bacteremia 2/2 complicated UTI/pyelonephritis Remains afebrile for the past 4 days and continues to have steady improvement in overall energy.  She did have a temperature of 100.2 overnight with recheck after Tylenol at 98.2.  She did not have any altered  mental status per her husband.  P.o. intake is continuing to improve and she is eating more each day.  We will transition to oral antibiotics with cefadroxil 1 g twice daily to complete a 10-day course EOT 07/08/2022.  - Cefadroxil 1 g twice daily until 07/08/2022 - Continue to monitor fever curve, Tylenol as needed for fever - Morning CBC and renal function panel  Elevated pulmonary artery pressures Mild calcific mitral stenosis Dyspnea with intermittent supplemental oxygen requirement TTE here showed elevated pulmonary artery pressures and chest x-ray which showed small bilateral pleural effusions with atelectasis of the lung bases.Cardiology evaluated the patient and have been cautiously diuresing her with Lasix 20 mg IV yesterday and increased to 40 mg IV today.  She is not having symptomatic dyspnea but does have oxygen drops to 87% today when walking.  Stable on room air at rest.  Improvement with deep breaths and coughing, likely component of atelectasis. We appreciate cardiology's evaluation and recommendations. - Continue with increase Lasix at 40 mg IV twice today, strict ins and outs - Wean off supplemental oxygen as tolerated  AKI, resolved Nontraumatic rhabdomyolysis, resolved Anion gap metabolic acidosis, resolved Proteinuria Creatinine continues to improve with creatinine of 1.04 today, at baseline. - Daily renal function panel  ESRD s/p renal transplant 2019 on chronic immunosuppression Baseline creatinine of 1. S/p deceased donor kidney transplant on 01/02/2018. 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  History of BK nephropathy with consistently low viral levels while on belatacept.  She follows with [facility] for her renal transplant and was last seen on 03/01/2022, they are aware of her admission.  On chronic immunosuppression with belatacept and sirolimus (was on mycophenolate from 12/2021 to 03/2022).   - sirolimus 2 mg daily   Nausea, vomiting,  and diarrhea with CT evidence of colitis in the ascending colon and cecum Completed 3-day course of azithromycin on 3/4.  No bowel movements over the last 24 hours.  Tolerating more p.o. intake without nausea, vomiting, or abdominal pain. - IV prochlorperazine 5 mg every 6 hours as needed for nausea - Encourage p.o. intake as tolerated   Thrombocytopenia Leukopenia Normocytic anemia Patient with a baseline leukopenia ranging from 3-4 K over the past year plus.  No significant neutropenia and has remained afebrile.  Patient with a stable normocytic anemia without recent iron studies.  Hemoglobin ranges from 9-13 with predominant levels over the last couple years of 10-11.  She has dropped from hemoglobin of 13-8.5 during admission with  significant volume resuscitation.  No signs of blood loss.  We will further evaluate with reticulocyte count and iron studies.   Type 2 diabetes A1c here of 8.3.  Lantus 23 units daily at home with sliding scale insulin. - sensitive SSI with meals and at bedtime coverage   Hypertension - Carvedilol 12.5 mg twice daily and amlodipine 2.5 mg twice daily    Hypothyroidism Continue home levothyroxine 62.5 mcg every other day.  Diet: Carb-Modified IVF: n/a VTE: Heparin Code: DNR   Dispo: Pending improvement in p.o. intake and cardiology evaluation.  Johny Blamer, DO Internal Medicine Resident PGY-1 Pager: [phone]  Please contact the on call pager after 5 pm and on weekends at [phone].

## 2022-07-05 NOTE — Progress Notes (Signed)
Occupational Therapy Treatment Patient Details Name: Denise Bush MRN: [id number] DOB: 13-May-1950 Today's Date: 07/05/2022   History of present illness Pt is a 72 yo female admitted with n/v, weakness found to have sepsis most likely from gastroenteritis and UTI.  PMH: kidney transplant 2019, ESRD, DM, HTN, legally blind.   OT comments  Pt making good progress towards OT goals, eager to participate and return to independence. Focused on endurance retraining for daily tasks with pt able to mobilize in hallway approx 50 ft before fatiguing and returning to room. Min guard provided for safety with intermittent cues for low vision. Pt's husband at bedside and supportive. Collaborated on energy conservation strategies to implement at home and importance of self monitoring symptoms. SpO2 did desat to 87% on RA with mobility but recovered to 93% within 15 seconds of seated rest break and pursed lip breathing.   Recommendations for follow up therapy are one component of a multi-disciplinary discharge planning process, led by the attending physician.  Recommendations may be updated based on patient status, additional functional criteria and insurance authorization.    Follow Up Recommendations  No OT follow up     Assistance Recommended at Discharge Intermittent Supervision/Assistance  Patient can return home with the following  A little help with walking and/or transfers;A little help with bathing/dressing/bathroom;Assistance with cooking/housework;Assist for transportation   Equipment Recommendations  None recommended by OT    Recommendations for Other Services      Precautions / Restrictions Precautions Precautions: Fall;Other (comment) Precaution Comments: watch O2 sats; low vision/legally blind Restrictions Weight Bearing Restrictions: No       Mobility Bed Mobility               General bed mobility comments: up in chair    Transfers Overall transfer level: Needs  assistance Equipment used: Rolling walker (2 wheels) Transfers: Sit to/from Stand Sit to Stand: Min guard           General transfer comment: no assist to stand from recliner. increased time to rise     Balance Overall balance assessment: Needs assistance Sitting-balance support: Feet supported Sitting balance-Leahy Scale: Good     Standing balance support: Bilateral upper extremity supported, During functional activity Standing balance-Leahy Scale: Fair                             ADL either performed or assessed with clinical judgement   ADL Overall ADL's : Needs assistance/impaired                                     Functional mobility during ADLs: Min guard;Rolling walker (2 wheels) General ADL Comments: improving endurance though deficits still noted. emphasis on energy conservation strategies with pt and husband planning to have pt use Rollator for seated rest breaks, use shower chair at home and slowly build up endurance.    Extremity/Trunk Assessment Upper Extremity Assessment Upper Extremity Assessment: Overall WFL for tasks assessed   Lower Extremity Assessment Lower Extremity Assessment: Defer to PT evaluation        Vision   Vision Assessment?: No apparent visual deficits   Perception     Praxis      Cognition Arousal/Alertness: Awake/alert Behavior During Therapy: WFL for tasks assessed/performed Overall Cognitive Status: Within Functional Limits for tasks assessed  Exercises      Shoulder Instructions       General Comments Husband at bedside and supportive. Pt completing IS exercises frequently    Pertinent Vitals/ Pain       Pain Assessment Pain Assessment: No/denies pain  Home Living                                          Prior Functioning/Environment              Frequency  Min 2X/week        Progress Toward  Goals  OT Goals(current goals can now be found in the care plan section)  Progress towards OT goals: Progressing toward goals  Acute Rehab OT Goals Patient Stated Goal: not need O2, increase endurance and go home soon OT Goal Formulation: With patient/family Time For Goal Achievement: 07/17/22 Potential to Achieve Goals: Good ADL Goals Pt Will Perform Grooming: with supervision;standing Additional ADL Goal #1: Pt will walk to bathroom and complete all toileting on high commode with S Additional ADL Goal #2: Pt will gather all clothes and dress self with supervision and one rest break. Additional ADL Goal #3: Pt will state 3 things she can do differenlty at home to conserve energy during adls without cues.  Plan Discharge plan remains appropriate    Co-evaluation                 AM-PAC OT "6 Clicks" Daily Activity     Outcome Measure   Help from another person eating meals?: None Help from another person taking care of personal grooming?: None Help from another person toileting, which includes using toliet, bedpan, or urinal?: A Little Help from another person bathing (including washing, rinsing, drying)?: A Little Help from another person to put on and taking off regular upper body clothing?: None Help from another person to put on and taking off regular lower body clothing?: A Little 6 Click Score: 21    End of Session Equipment Utilized During Treatment: Gait belt;Rolling walker (2 wheels)  OT Visit Diagnosis: Unsteadiness on feet (R26.81)   Activity Tolerance Patient tolerated treatment well;Patient limited by fatigue   Patient Left with call bell/phone within reach;in chair;with family/visitor present   Nurse Communication Mobility status;Other (comment) (O2)        Time: [id number] OT Time Calculation (min): 24 min  Charges: OT General Charges $OT Visit: 1 Visit OT Treatments $Self Care/Home Management : 8-22 mins $Therapeutic Activity: 8-22  mins  Malachy Chamber, OTR/L Acute Rehab Services Office: [phone]   Layla Maw 07/05/2022, 11:02 AM

## 2022-07-05 NOTE — Progress Notes (Signed)
Physical Therapy Treatment Patient Details Name: Denise Bush MRN: [id number] DOB: 03-21-1951 Today's Date: 07/05/2022   History of Present Illness Pt is a 72 yo female admitted with n/v, weakness found to have sepsis most likely from gastroenteritis and UTI.  PMH: kidney transplant 2019, ESRD, DM, HTN, legally blind.    PT Comments    Pt reporting not feeling well this afternoon, states her fever feels as if it has returned, but is agreeable to session. Pt mobilizing well in hallway with use of RW, pt mostly struggles with fatigue and bed mobility at this time. Pt hopeful to d/c home soon, will continue to follow.     Recommendations for follow up therapy are one component of a multi-disciplinary discharge planning process, led by the attending physician.  Recommendations may be updated based on patient status, additional functional criteria and insurance authorization.  Follow Up Recommendations  Home health PT     Assistance Recommended at Discharge Frequent or constant Supervision/Assistance  Patient can return home with the following A little help with walking and/or transfers;A little help with bathing/dressing/bathroom;Assistance with cooking/housework;Assist for transportation;Help with stairs or ramp for entrance   Equipment Recommendations  Rolling walker (2 wheels)    Recommendations for Other Services       Precautions / Restrictions Precautions Precautions: Fall;Other (comment) Precaution Comments: watch O2 sats; low vision/legally blind Restrictions Weight Bearing Restrictions: No     Mobility  Bed Mobility Overal bed mobility: Needs Assistance Bed Mobility: Supine to Sit     Supine to sit: Min assist     General bed mobility comments: assist for trunk elevation via HHA    Transfers Overall transfer level: Needs assistance Equipment used: Rolling walker (2 wheels) Transfers: Sit to/from Stand Sit to Stand: Min guard                 Ambulation/Gait Ambulation/Gait assistance: Min guard Gait Distance (Feet): 90 Feet Assistive device: Rolling walker (2 wheels) Gait Pattern/deviations: Step-through pattern, Decreased stride length, Trunk flexed Gait velocity: decr     General Gait Details: close gaurd for safety and assist for navigating hallway given vision; cues for upright posture   Stairs             Wheelchair Mobility    Modified Rankin (Stroke Patients Only)       Balance Overall balance assessment: Needs assistance Sitting-balance support: Feet supported Sitting balance-Leahy Scale: Good     Standing balance support: Bilateral upper extremity supported, During functional activity Standing balance-Leahy Scale: Fair                              Cognition Arousal/Alertness: Awake/alert Behavior During Therapy: WFL for tasks assessed/performed Overall Cognitive Status: Within Functional Limits for tasks assessed                                 General Comments: pt reports feeling "lazy", states she feels as if she has a fever again        Exercises      General Comments        Pertinent Vitals/Pain Pain Assessment Pain Assessment: No/denies pain    Home Living                          Prior Function  PT Goals (current goals can now be found in the care plan section) Acute Rehab PT Goals Patient Stated Goal: "get my energy back" PT Goal Formulation: With patient Time For Goal Achievement: 07/10/22 Potential to Achieve Goals: Good Progress towards PT goals: Progressing toward goals    Frequency    Min 3X/week      PT Plan Current plan remains appropriate    Co-evaluation              AM-PAC PT "6 Clicks" Mobility   Outcome Measure  Help needed turning from your back to your side while in a flat bed without using bedrails?: A Little Help needed moving from lying on your back to sitting on the side of a  flat bed without using bedrails?: A Little Help needed moving to and from a bed to a chair (including a wheelchair)?: A Little Help needed standing up from a chair using your arms (e.g., wheelchair or bedside chair)?: A Little Help needed to walk in hospital room?: A Little Help needed climbing 3-5 steps with a railing? : A Little 6 Click Score: 18    End of Session   Activity Tolerance: Patient limited by fatigue Patient left: with call bell/phone within reach;in chair;Other (comment) (pt reports she will press her call button and wait for assist prior to mobilizing out of chair) Nurse Communication: Mobility status PT Visit Diagnosis: Unsteadiness on feet (R26.81);Difficulty in walking, not elsewhere classified (R26.2)     Time: [id number] PT Time Calculation (min) (ACUTE ONLY): 23 min  Charges:  $Gait Training: 8-22 mins $Therapeutic Activity: 8-22 mins                     Stacie Glaze, PT DPT Acute Rehabilitation Services Pager [phone]  Office [phone]    [place] 07/05/2022, 5:24 PM

## 2022-07-06 ENCOUNTER — Other Ambulatory Visit (HOSPITAL_COMMUNITY): Payer: Self-pay

## 2022-07-06 DIAGNOSIS — R7881 Bacteremia: Secondary | ICD-10-CM | POA: Diagnosis not present

## 2022-07-06 DIAGNOSIS — B9620 Unspecified Escherichia coli [E. coli] as the cause of diseases classified elsewhere: Secondary | ICD-10-CM | POA: Diagnosis not present

## 2022-07-06 LAB — CBC WITH DIFFERENTIAL/PLATELET
Abs Immature Granulocytes: 0 10*3/uL (ref 0.00–0.07)
Basophils Absolute: 0.1 10*3/uL (ref 0.0–0.1)
Basophils Relative: 3 %
Eosinophils Absolute: 0.1 10*3/uL (ref 0.0–0.5)
Eosinophils Relative: 5 %
HCT: 28.9 % — ABNORMAL LOW (ref 36.0–46.0)
Hemoglobin: 9 g/dL — ABNORMAL LOW (ref 12.0–15.0)
Lymphocytes Relative: 35 %
Lymphs Abs: 0.9 10*3/uL (ref 0.7–4.0)
MCH: 27.2 pg (ref 26.0–34.0)
MCHC: 31.1 g/dL (ref 30.0–36.0)
MCV: 87.3 fL (ref 80.0–100.0)
Monocytes Absolute: 0.5 10*3/uL (ref 0.1–1.0)
Monocytes Relative: 18 %
Neutro Abs: 1 10*3/uL — ABNORMAL LOW (ref 1.7–7.7)
Neutrophils Relative %: 39 %
Platelets: 334 10*3/uL (ref 150–400)
RBC: 3.31 MIL/uL — ABNORMAL LOW (ref 3.87–5.11)
RDW: 14.6 % (ref 11.5–15.5)
WBC: 2.6 10*3/uL — ABNORMAL LOW (ref 4.0–10.5)
nRBC: 0 % (ref 0.0–0.2)
nRBC: 0 /100 WBC

## 2022-07-06 LAB — MAGNESIUM: Magnesium: 1.9 mg/dL (ref 1.7–2.4)

## 2022-07-06 LAB — RENAL FUNCTION PANEL
Albumin: 2.4 g/dL — ABNORMAL LOW (ref 3.5–5.0)
Anion gap: 11 (ref 5–15)
BUN: 14 mg/dL (ref 8–23)
CO2: 25 mmol/L (ref 22–32)
Calcium: 8.9 mg/dL (ref 8.9–10.3)
Chloride: 102 mmol/L (ref 98–111)
Creatinine, Ser: 1.12 mg/dL — ABNORMAL HIGH (ref 0.44–1.00)
GFR, Estimated: 53 mL/min — ABNORMAL LOW (ref >60)
Glucose, Bld: 129 mg/dL — ABNORMAL HIGH (ref 70–99)
Phosphorus: 3 mg/dL (ref 2.5–4.6)
Potassium: 3.5 mmol/L (ref 3.5–5.1)
Sodium: 138 mmol/L (ref 135–145)

## 2022-07-06 LAB — GLUCOSE, CAPILLARY: Glucose-Capillary: 144 mg/dL — ABNORMAL HIGH (ref 70–99)

## 2022-07-06 MED ORDER — ORAL CARE MOUTH RINSE: 15.0000 mL | OROMUCOSAL | Status: DC | PRN

## 2022-07-06 MED ORDER — CARVEDILOL 12.5 MG PO TABS
12.5000 mg | ORAL_TABLET | Freq: Two times a day (BID) | ORAL | 1 refills | Status: DC
  Filled 2022-07-06: qty 60, 30d supply, fill #0

## 2022-07-06 MED ORDER — CEFADROXIL 500 MG PO CAPS
1000.0000 mg | ORAL_CAPSULE | Freq: Two times a day (BID) | ORAL | 0 refills | Status: DC
  Filled 2022-07-06: qty 10, 3d supply, fill #0

## 2022-07-06 MED ORDER — ASPIRIN EC 81 MG PO TBEC: 81.0000 mg | DELAYED_RELEASE_TABLET | Freq: Every day | ORAL | Status: AC

## 2022-07-06 MED ORDER — FUROSEMIDE 20 MG PO TABS
20.0000 mg | ORAL_TABLET | Freq: Every day | ORAL | 0 refills | Status: DC
  Filled 2022-07-06: qty 30, 30d supply, fill #0

## 2022-07-06 NOTE — Progress Notes (Signed)
Progress Note  Patient Name: Denise Bush Date of Encounter: 07/06/2022  Primary Cardiologist: Werner Lean, MD   Subjective   Off O2. Patient notes she feels back to baseline.  Eager to get home.  Inpatient Medications    Scheduled Meds:  amLODipine  2.5 mg Oral BID   carvedilol  12.5 mg Oral BID WC   cefadroxil  1,000 mg Oral BID   enoxaparin (LOVENOX) injection  40 mg Subcutaneous Q24H   Gerhardt's butt cream   Topical Daily   insulin aspart  0-5 Units Subcutaneous QHS   insulin aspart  0-9 Units Subcutaneous TID WC   levothyroxine  62.5 mcg Oral QODAY   multivitamin with minerals  1 tablet Oral Daily   Sirolimus  2 mg Oral Q1200   Continuous Infusions:  PRN Meds: acetaminophen **OR** acetaminophen, loperamide, mouth rinse, prochlorperazine   Vital Signs    Vitals:   07/05/22 1539 07/05/22 2123 07/06/22 0608 07/06/22 0845  BP: (!) 145/62 (!) 146/53 (!) 145/69 (!) 105/92  Pulse: 92 77 80 81  Resp: '17 18 18 16  '$ Temp: 99.1 F (37.3 C) 98.7 F (37.1 C) 98.7 F (37.1 C) 98.1 F (36.7 C)  TempSrc: Oral Oral Oral Oral  SpO2: 91% 95% 92% 97%  Weight:      Height:        Intake/Output Summary (Last 24 hours) at 07/06/2022 1103 Last data filed at 07/06/2022 [id number] Gross per 24 hour  Intake 480 ml  Output 0 ml  Net 480 ml   Filed Weights   06/29/22 1038 06/29/22 1913 07/01/22 0233  Weight: 72.1 kg 74.2 kg 76.8 kg    Telemetry    SR Personally Reviewed  ECG    No new - Personally Reviewed  Physical Exam   Gen: no distress   Neck: No JVD  Cardiac: No Rubs or Gallops, predominantly systolic murmur, RRR +2 radial pulses Respiratory:  Rare rhonchi, normal effort, normal  respiratory rate GI: Soft, nontender, non-distended  MS: Non pitting edema;  moves all extremities Integument: Skin feels warm Neuro:  At time of evaluation, alert and oriented to person/place/time/situation  Psych: Normal affect, patient feels well   Labs     Chemistry Recent Labs  Lab 06/29/22 1319 06/29/22 2056 07/02/22 0720 07/03/22 [id number] 07/04/22 0713 07/05/22 0624 07/06/22 0723  NA 141   < > 135   < > 136 138 138  K 4.3   < > 3.9   < > 4.0 3.8 3.5  CL 107   < > 103   < > 105 106 102  CO2 15*   < > 26   < > '25 23 25  '$ GLUCOSE 142*   < > 159*   < > 132* 156* 129*  BUN 31*   < > 25*   < > '14 15 14  '$ CREATININE 1.62*   < > 1.38*   < > 1.10* 1.04* 1.12*  CALCIUM 9.7   < > 8.6*   < > 8.7* 8.7* 8.9  PROT 7.6  --  5.6*  --   --   --   --   ALBUMIN 3.7   < > 2.2*   < > 2.2* 2.2* 2.4*  AST 50*  --  45*  --   --   --   --   ALT 19  --  24  --   --   --   --   ALKPHOS 81  --  46  --   --   --   --   BILITOT 0.8  --  0.4  --   --   --   --   GFRNONAA 34*   < > 41*   < > 54* 58* 53*  ANIONGAP 19*   < > 6   < > '6 9 11   '$ < > = values in this interval not displayed.     Hematology Recent Labs  Lab 07/04/22 0713 07/05/22 0624 07/06/22 0723  WBC 3.0* 2.2*  2.4* 2.6*  RBC 3.21* 3.15*  3.14*  3.25* 3.31*  HGB 9.0* 8.9*  8.5* 9.0*  HCT 27.6* 27.7*  27.9* 28.9*  MCV 86.0 87.9  88.9 87.3  MCH 28.0 28.3  27.1 27.2  MCHC 32.6 32.1  30.5 31.1  RDW 14.6 14.8  14.9 14.6  PLT 178 200  174 334    Cardiac EnzymesNo results for input(s): "TROPONINI" in the last 168 hours. No results for input(s): "TROPIPOC" in the last 168 hours.   BNPNo results for input(s): "BNP", "PROBNP" in the last 168 hours.   DDimer No results for input(s): "DDIMER" in the last 168 hours.   Radiology    DG CHEST PORT 1 VIEW  Result Date: 07/04/2022 CLINICAL DATA:  Dyspnea. EXAM: PORTABLE CHEST 1 VIEW COMPARISON:  06/29/2022. FINDINGS: 1358 hours. Low lung volumes accentuate the pulmonary vasculature and cardiomediastinal silhouette. New small bilateral pleural effusions with adjacent atelectasis in the lung bases. No pneumothorax. IMPRESSION: New small bilateral pleural effusions with adjacent atelectasis in the lung bases. Electronically Signed   By: Emmit Alexanders M.D.   On: 07/04/2022 14:10     Patient Profile     72 y.o. female Sepsis and E Coli Bacteremia with diarrhea that is improving and SOB after volume resuscitation  Assessment & Plan    Mild Calcific Mitral Stenosis ESRD with distant renal transplant AKI Sepsis requiring significant volume resuscitation - would get strict I'Os and daily weights - Diuretic: 20 mg Po daily - we have increased BB (07/04/22) - agree with IS  HTN - BB as above, norvasc is fine  Needs BP in f/u with [place] or PCP Planned for DC by primary team   Outpatient f/u me or APP  For questions or updates, please contact [facility] Please consult [email or web address] for contact info under Cardiology/STEMI.      Rudean Haskell, MD [facility] [place], [address], [place] [address] [phone]  11:03 AM

## 2022-07-06 NOTE — Progress Notes (Signed)
TOC medications delivered to patient. Patient discharging home with family.

## 2022-07-06 NOTE — Discharge Instructions (Addendum)
Denise Bush,  You were recently admitted to [facility] for bacterial infection that spread to your blood from your urinary tract.   Continue taking your home medications with the following changes  Start taking Furosemide 20 mg daily Stop taking until you see your PCP Lantus   You should seek further medical care if you have any return of fevers, decreased urine output, inability to tolerate food or drinks, worsening diarrhea, or any other worrisome symptoms.  We recommend that you see your primary care doctor in about a week to make sure that you continue to improve. We are so glad that you are feeling better.  Sincerely, Johny Blamer, DO

## 2022-07-06 NOTE — Discharge Summary (Addendum)
Name: Denise Bush MRN: [id number] DOB: 1950/11/11 73 y.o. PCP: Denise Hatchet, NP  Date of Admission: 06/29/2022 10:21 AM Date of Discharge: 07/06/2022 Attending Physician: Dr.  Saverio Bush  Discharge Diagnosis: Principal Problem:   E coli bacteremia Active Problems:   Hypothyroidism   Hypertension   Type 2 diabetes mellitus treated with insulin ([place])   Kidney transplant recipient   Sepsis ([place])   Complicated UTI (urinary tract infection)   AKI (acute kidney injury) (HCC)   Nausea, vomiting, and diarrhea   High anion gap metabolic acidosis   Colitis   Diarrhea   Abnormal CT scan, gastrointestinal tract    Discharge Medications: Allergies as of 07/06/2022       Reactions   Hydralazine Shortness Of Breath   Chest pain Fatigue    Azor [amlodipine-olmesartan] Other (See Comments)   Sharp chest pain    Benadryl [diphenhydramine] Other (See Comments)   Unknown reaction   Camellia Other (See Comments)   Unknown reaction   Denaverine Other (See Comments)   Unknown reaction   Dyazide [hydrochlorothiazide W-triamterene] Swelling, Other (See Comments)   Chest pain Throat swelling   Glucophage [metformin] Diarrhea, Itching, Nausea Only   Bowel incontinence    Hydrochlorothiazide Other (See Comments)   "contractions in throat"   Laureth Other (See Comments)   Unknown reaction   Prednisone Other (See Comments)   Elevated Glucose    Reglan [metoclopramide] Other (See Comments)   Worsening acid reflux   Trandate [labetalol] Other (See Comments)   Foot pain   Zestril [lisinopril] Other (See Comments)   Severe radiating foot pain.   Aldactone [spironolactone] Rash, Other (See Comments)   Severe radiating pain in feet   Rogaine [minoxidil] Rash, Other (See Comments)   Fatigue  Choking, throat contractions   Statins Rash   Fatigue        Medication List     STOP taking these medications    Lantus SoloStar 100 UNIT/ML Solostar Pen Generic drug: insulin glargine        TAKE these medications    acetaminophen 500 MG tablet Commonly known as: TYLENOL Take 500 mg by mouth daily as needed.   amLODipine 2.5 MG tablet Commonly known as: NORVASC Take one tablet twice a day What changed:  how much to take how to take this when to take this additional instructions   aspirin EC 81 MG tablet Take 1 tablet (81 mg total) by mouth daily.   BELATACEPT IV Inject 1 Dose into the vein every 28 (twenty-eight) days.   carvedilol 12.5 MG tablet Commonly known as: COREG Take 1 tablet (12.5 mg total) by mouth 2 (two) times daily with a meal. What changed:  medication strength how much to take   cefadroxil 500 MG capsule Commonly known as: DURICEF Take 2 capsules (1,000 mg total) by mouth 2 times daily for 5 doses.   ezetimibe 10 MG tablet Commonly known as: ZETIA Take 1 tablet (10 mg total) by mouth daily.   furosemide 20 MG tablet Commonly known as: Lasix Take 1 tablet (20 mg total) by mouth daily.   gemfibrozil 600 MG tablet Commonly known as: LOPID TAKE 1 TABLET TWICE A DAY WITH MEALS FOR CHOLESTEROL AND TRIGLYCERIDES What changed: See the new instructions.   insulin lispro 100 UNIT/ML KwikPen Commonly known as: HUMALOG Inject 3-5 Units into the skin 2 (two) times daily as needed. Inject 3-5 units up to twice daily with meals as needed for hyperglycemia. Dose dependant on fasting  blood sugar and meal content.   ketoconazole 2 % cream Commonly known as: NIZORAL Apply 1 Application topically daily as needed for irritation (redness, scaling).   levothyroxine 125 MCG tablet Commonly known as: SYNTHROID Take 62.5 mcg by mouth every other day.   linagliptin 5 MG Tabs tablet Commonly known as: TRADJENTA Take 5 mg by mouth daily.   sirolimus 1 MG tablet Commonly known as: RAPAMUNE Take    1 tablet     Daily     for Kidney Transplant What changed:  how much to take how to take this when to take this additional instructions         Disposition and follow-up:   Ms.Denise Bush was discharged from [facility] in Good condition.  At the hospital follow up visit please address:  1.  Follow-up:  a.  Stability of renal function and fluid status after starting Lasix 20 mg daily on discharge.  Evaluate fluid status and ensure she follows up with cardiology outpatient.    b.  Monitor and ensure transplant team is aware of leukopenia on chronic immunosuppression.   c.  Ensure completion of antibiotic course and no return of fevers or other symptoms associated with her hospitalization.   D. Discuss need to resume long-acting insulin with increasing oral intake.  2.  Labs / imaging needed at time of follow-up: Renal function panel, CBC W/differential  3.  Pending labs/ test needing follow-up: None  Follow-up Appointments:  Follow-up Information     [facility] Follow up.   Specialty: Home Health Services Why: Someone will call you to schedule first home visit. If you have not received a call after two days of discharging home, call their number listed. If no one comes to assess, call Case Manager at [phone]. Contact information: [place] [address] [phone]         [facility] [place] Follow up.   Contact information: [place] [phone] [place] [facility] Course by problem list: Denise Bush is a 72 y.o. female with pertinent PMH of ESRD s/p DDKT 01/02/2018 on chronic immunosuppression with sirolimus and belatacept infusions, T2DM, HTN, hypothyroidism, and proliferative diabetic retinopathy who presented with 2 days of nausea, vomiting, and diarrhea with intermittent confusion and fevers and is admitted for sepsis with E. coli bacteremia 2/2 UTI complicated by AKI of transplanted kidney.     Sepsis with E. coli bacteremia 2/2  complicated UTI/pyelonephritis Presented initially with 2 days of nausea, vomiting, and diarrhea with associated waxing and waning confusion and fevers.  She was found to have sepsis with pansensitive E. coli bacteremia secondary to urinary tract infection with some perinephric stranding of her transplanted kidney on CT scan.  On admission she was aggressively fluid resuscitated and started on empiric broad-spectrum antibiotics.  Over the first couple days she did have intermittent fevers and some mild confusion with continued diarrhea and decreased p.o. intake.  She gradually improved and was afebrile for over 4 days prior to discharge with improving appetite and energy levels.  She was transitioned to oral antibiotics to complete a 10-day course of antibiotics at home.  Antibiotics included vancomycin, cefepime, metronidazole which was narrowed to ceftriaxone and then to Cefadroxil 1 g twice daily.    Elevated pulmonary artery pressures Mild calcific mitral stenosis  Dyspnea with intermittent supplemental oxygen requirement After aggressive IV fluid rehydration for sepsis with roughly 13 L and over the first couple days she had some hypoxemia with new oxygen requirement.  TTE showed elevated pulmonary artery pressures and mild calcific mitral stenosis.  Chest x-ray showed small bilateral pleural effusions with atelectasis of the lung bases.cardiology evaluated the patient and recommended gentle diuresis which was uptitrated to Lasix 40 mg IV twice daily.  We also increased her carvedilol dose to 12.5 mg twice daily.  She did not require any supplemental oxygen for about 24 hours prior to discharge.  She was discharged on Lasix 20 mg daily and will follow-up with cardiology outpatient.  AKI, resolved Nontraumatic rhabdomyolysis, resolved Anion gap metabolic acidosis, resolved Proteinuria Presented with elevated creatinine of 1.62 with a peak of 1.84 secondary to poor perfusion with sepsis.  Baseline  creatinine of 1.  Creatinine can continue to improve with IV fluid resuscitation and antibiotics.  Creatinine was at baseline prior to discharge with a small bump on the day of discharge after increased dose of Lasix.  No drop in urine output during admission.  Transplant renal ultrasound without abnormalities.  CK is elevated on admission with a peak of 3000 that trended down after aggressive fluids and was likely due to her being severely weak and down on the ground at home for a few hours.  She had 300+ protein on admission UA with myoglobinuria but no red blood cells under the microscope.  Recommend that she follow-up with her transplant team and her PCP with a follow-up UA.   ESRD s/p renal transplant 2019 on chronic immunosuppression Baseline creatinine of 1. S/p deceased donor kidney transplant on 01/02/2018. 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  History of BK nephropathy with consistently low viral levels while on belatacept.  She follows with [facility] for her renal transplant and was last seen on 03/01/2022, they are aware of her admission.  On chronic immunosuppression with belatacept and sirolimus (was on mycophenolate from 12/2021 to 03/2022).  Nephrology was consulted during admission for evaluation and management and did not find any signs of rejection.  She was continued on sirolimus and did not require another belatacept infusion during admission.   Nausea, vomiting, and diarrhea with CT evidence of colitis in the ascending colon and cecum Presented with E. coli bacteremia and sepsis secondary to UTI with concomitant severe nausea, vomiting, and diarrhea causing dehydration.  CT of the abdomen pelvis showed evidence of colitis in the ascending colon and cecum and she was treated with a 3-day course of azithromycin.  She was having liquid bowel movements prior to admission and these slowly improved to solid on the day of discharge.    Leukopenia Normocytic  anemia Thrombocytopenia Patient with a baseline leukopenia ranging from 2-4 K over the past year plus.  Transient thrombocytopenia with platelet nadir of 115.  Patient with a stable normocytic anemia (hemoglobin 10-11) with iron studies showing anemia chronic disease with an elevated ferritin in the setting of acute illness.  Reticulocyte index showed hypoproliferation.  Peripheral smear without abnormal morphology of RBC, WBC, or platelets x 2 during admission.  No evidence of blood loss during admission with a large component of dilutional anemia.  Recommend follow-up with PCP and transplant team to further evaluate and manage chronic anemia and leukopenia.  Type 2 diabetes A1c here of 8.3.  She is on Lantus 23 units daily at home with sliding scale insulin.  During admission she was on a sliding scale with short acting insulin and required a maximum of 5 units daily.  She was not eating as much is usually because it was recommended to not resume her long-acting insulin unless her morning fasting glucose was above 180 at home.   Hypertension Hypertensive meds were initially held on admission due to sepsis and low blood pressures.  Cardiology recommended increasing her carvedilol dose and she was discharged on carvedilol 12.5 mg twice daily and amlodipine 2.5 mg twice daily.   Hypothyroidism Continued home levothyroxine 62.5 mcg every other day.   Discharge Subjective: Patient is doing well this morning and is nearly back to baseline from energy standpoint.  She is eating more and not having any nausea or vomiting or abdominal pain.  She had 1 bowel movement that was more formed yesterday.  She denies any dyspnea or need for supplemental oxygen.  Discharge Exam:   BP (!) 105/92 (BP Location: Right Arm)   Pulse 81   Temp 98.1 F (36.7 C) (Oral)   Resp 16   Ht '5\' 5"'$  (1.651 m)   Wt 76.8 kg   LMP  (LMP Unknown)   SpO2 97%   BMI 28.18 kg/m  Constitutional: Mildly tired appearing elderly  female sitting in bedside chair. In no acute distress. Cardio:Regular rate and rhythm. Palpable thrill over LUE AVF. Pulm: Normal work of breathing on room air.  Clear to auscultation bilaterally MSK: Trace pitting edema lower extremities bilaterally below the knees Skin:Warm and dry.  Neuro:Alert and oriented x3. No focal deficit noted.  Pertinent Labs, Studies, and Procedures:     Latest Ref Rng & Units 07/06/2022    7:23 AM 07/05/2022    6:24 AM 07/04/2022    7:13 AM  CBC  WBC 4.0 - 10.5 K/uL 2.6  2.4    2.2  3.0   Hemoglobin 12.0 - 15.0 g/dL 9.0  8.5    8.9  9.0   Hematocrit 36.0 - 46.0 % 28.9  27.9    27.7  27.6   Platelets 150 - 400 K/uL 334  174    200  178        Latest Ref Rng & Units 07/06/2022    7:23 AM 07/05/2022    6:24 AM 07/04/2022    7:13 AM  CMP  Glucose 70 - 99 mg/dL 129  156  132   BUN 8 - 23 mg/dL '14  15  14   '$ Creatinine 0.44 - 1.00 mg/dL 1.12  1.04  1.10   Sodium 135 - 145 mmol/L 138  138  136   Potassium 3.5 - 5.1 mmol/L 3.5  3.8  4.0   Chloride 98 - 111 mmol/L 102  106  105   CO2 22 - 32 mmol/L '25  23  25   '$ Calcium 8.9 - 10.3 mg/dL 8.9  8.7  8.7     US Renal Transplant w/Doppler  Result Date: 06/30/2022 CLINICAL DATA:  Acute kidney injury EXAM: ULTRASOUND OF RENAL TRANSPLANT WITH RENAL DOPPLER ULTRASOUND TECHNIQUE: Ultrasound examination of the renal transplant was performed with gray-scale, color and duplex doppler evaluation. COMPARISON:  None Available. FINDINGS: Transplant kidney location: RLQ Transplant Kidney: Renal measurements: 10.5 x 6.6 x 7.2 cm = volume: 297m. Normal in size and parenchymal echogenicity. No evidence of mass or hydronephrosis. No peri-transplant fluid collection seen. Color flow in the main renal artery:  Yes Color flow in the main renal vein:  Yes Duplex Doppler Evaluation: Main Renal  Artery Velocity: 86 cm/sec Main Renal Artery Resistive Index: 0.8 Venous waveform in main renal vein:  Present Intrarenal resistive index in upper pole:   0.8 (normal 0.6-0.8; equivocal 0.8-0.9; abnormal >= 0.9) Intrarenal resistive index in lower pole: 0.76 (normal 0.6-0.8; equivocal 0.8-0.9; abnormal >= 0.9) Bladder: Normal for degree of bladder distention. Ureteral jets are not seen. Other findings: Small amount of free fluid in the right lower quadrant. IMPRESSION: 1. Normal sonographic appearance of the transplant kidney. 2. Small amount of free fluid in the right lower quadrant. Electronically Signed   By: Ronney Asters M.D.   On: 06/30/2022 16:48   DG Chest Port 1 View  Result Date: 06/29/2022 CLINICAL DATA:  Questionable sepsis - evaluate for abnormality EXAM: PORTABLE CHEST - 1 VIEW COMPARISON:  11/29/2021 FINDINGS: Cardiac silhouette is unremarkable. No pneumothorax or pleural effusion. The lungs are clear. Aorta is calcified. There are thoracic degenerative changes. IMPRESSION: No acute cardiopulmonary process. Electronically Signed   By: Sammie Bench M.D.   On: 06/29/2022 15:08   CT Head Wo Contrast  Result Date: 06/29/2022 CLINICAL DATA:  Patient complains of nausea, vomiting, headache and intermittent aphasia with slurred speech. EXAM: CT HEAD WITHOUT CONTRAST TECHNIQUE: Contiguous axial images were obtained from the base of the skull through the vertex without intravenous contrast. RADIATION DOSE REDUCTION: This exam was performed according to the departmental dose-optimization program which includes automated exposure control, adjustment of the mA and/or kV according to patient size and/or use of iterative reconstruction technique. COMPARISON:  None Available. FINDINGS: Brain: No evidence of acute infarction, hemorrhage, hydrocephalus, extra-axial collection or mass lesion/mass effect. There is mild patchy low-attenuation within the subcortical and periventricular white matter compatible with chronic microvascular disease. Vascular: No hyperdense vessel or unexpected calcification. Skull: Normal. Negative for fracture or focal lesion.  Sinuses/Orbits: Right lobe appears normal. The left globe is dense with peripheral dystrophic calcifications. Paranasal sinuses and mastoid air cells are clear. Other: None IMPRESSION: 1. No acute intracranial abnormalities. 2. Mild chronic microvascular change Electronically Signed   By: Kerby Moors M.D.   On: 06/29/2022 11:18     Discharge Instructions: Discharge Instructions     Call MD for:  difficulty breathing, headache or visual disturbances   Complete by: As directed    Call MD for:  extreme fatigue   Complete by: As directed    Call MD for:  persistant dizziness or light-headedness   Complete by: As directed    Call MD for:  persistant nausea and vomiting   Complete by: As directed    Call MD for:  temperature >100.4   Complete by: As directed    Diet - low sodium heart healthy   Complete by: As directed    Increase activity slowly   Complete by: As directed        Signed: Johny Blamer, DO 07/06/2022, 4:13 PM   Pager: [phone]

## 2022-07-06 NOTE — TOC Transition Note (Addendum)
Transition of Care [facility]) - CM/SW Discharge Note   Patient Details  Name: Denise Bush MRN: [id number] Date of Birth: 1951/03/29  [facility] CM/SW Contact:  Tom-Johnson, Renea Ee, RN Phone Number: 07/06/2022, 2:19 PM   Clinical Narrative:     Patient is scheduled for discharge today. Home Health info on AVS.  RW recommended by PT, patient states she has one a with a rollator at home.  Hospital f/u and discharge instructions on AVS.  Prescription sent to [facility] and meds to be delivered to patient at bedside prior to discharge.  Family to transport at discharge. No further TOC needs noted.    Final next level of care: Home w Home Health Services Barriers to Discharge: Barriers Resolved   Patient Goals and CMS Choice CMS Medicare.gov Compare Post Acute Care list provided to:: Patient Choice offered to / list presented to : Patient, Spouse  Discharge Placement                  Patient to be transferred to facility by: Husband      Discharge Plan and Services Additional resources added to the After Visit Summary for     Discharge Planning Services: CM Consult Post Acute Care Choice: Home Health                    HH Arranged: PT [place]: [place] Date [place]: 07/04/22 Time HH Agency Contacted: 1400 Representative spoke with at [place]: [place] ([place]) Interventions SDOH Screenings   Food Insecurity: No Food Insecurity (06/29/2022)  Housing: Low Risk  (06/29/2022)  Transportation Needs: No Transportation Needs (06/29/2022)  Utilities: Not At Risk (06/29/2022)  Depression (PHQ2-9): Low Risk  (10/06/2020)  Tobacco Use: Low Risk  (06/29/2022)     Readmission Risk Interventions     No data to display

## 2022-07-21 ENCOUNTER — Ambulatory Visit (HOSPITAL_COMMUNITY)
Admission: RE | Admit: 2022-07-21 | Discharge: 2022-07-21 | Disposition: A | Discharge status: Home / Self Care | Payer: Medicare PPO | Source: Ambulatory Visit | Attending: Internal Medicine | Admitting: Internal Medicine

## 2022-07-21 DIAGNOSIS — Z940 Kidney transplant status: Secondary | ICD-10-CM | POA: Diagnosis present

## 2022-07-21 LAB — URINALYSIS, COMPLETE (UACMP) WITH MICROSCOPIC
Bilirubin Urine: NEGATIVE
Glucose, UA: NEGATIVE mg/dL
Hgb urine dipstick: NEGATIVE
Ketones, ur: NEGATIVE mg/dL
Nitrite: NEGATIVE
Protein, ur: NEGATIVE mg/dL
Specific Gravity, Urine: 1.015 (ref 1.005–1.030)
pH: 5 (ref 5.0–8.0)

## 2022-07-21 LAB — CBC WITH DIFFERENTIAL/PLATELET
Abs Immature Granulocytes: 0.06 10*3/uL (ref 0.00–0.07)
Basophils Absolute: 0 10*3/uL (ref 0.0–0.1)
Basophils Relative: 1 %
Eosinophils Absolute: 0.1 10*3/uL (ref 0.0–0.5)
Eosinophils Relative: 2 %
HCT: 34.7 % — ABNORMAL LOW (ref 36.0–46.0)
Hemoglobin: 10.6 g/dL — ABNORMAL LOW (ref 12.0–15.0)
Immature Granulocytes: 2 %
Lymphocytes Relative: 37 %
Lymphs Abs: 1.3 10*3/uL (ref 0.7–4.0)
MCH: 26.6 pg (ref 26.0–34.0)
MCHC: 30.5 g/dL (ref 30.0–36.0)
MCV: 87 fL (ref 80.0–100.0)
Monocytes Absolute: 0.5 10*3/uL (ref 0.1–1.0)
Monocytes Relative: 15 %
Neutro Abs: 1.5 10*3/uL — ABNORMAL LOW (ref 1.7–7.7)
Neutrophils Relative %: 43 %
Platelets: 324 10*3/uL (ref 150–400)
RBC: 3.99 MIL/uL (ref 3.87–5.11)
RDW: 15.2 % (ref 11.5–15.5)
WBC: 3.4 10*3/uL — ABNORMAL LOW (ref 4.0–10.5)
nRBC: 0 % (ref 0.0–0.2)

## 2022-07-21 LAB — BASIC METABOLIC PANEL
Anion gap: 10 (ref 5–15)
BUN: 22 mg/dL (ref 8–23)
CO2: 24 mmol/L (ref 22–32)
Calcium: 9.8 mg/dL (ref 8.9–10.3)
Chloride: 107 mmol/L (ref 98–111)
Creatinine, Ser: 1.13 mg/dL — ABNORMAL HIGH (ref 0.44–1.00)
GFR, Estimated: 52 mL/min — ABNORMAL LOW (ref >60)
Glucose, Bld: 130 mg/dL — ABNORMAL HIGH (ref 70–99)
Potassium: 4 mmol/L (ref 3.5–5.1)
Sodium: 141 mmol/L (ref 135–145)

## 2022-07-21 LAB — PROTEIN / CREATININE RATIO, URINE
Creatinine, Urine: 121 mg/dL
Protein Creatinine Ratio: 0.13 mg/mg{Cre} (ref 0.00–0.15)
Total Protein, Urine: 16 mg/dL

## 2022-07-21 MED ORDER — SODIUM CHLORIDE 0.9 % IV SOLN
375.0000 mg | INTRAVENOUS | Status: DC
  Administered 2022-07-21: 375 mg via INTRAVENOUS
  Filled 2022-07-21: qty 375

## 2022-07-23 LAB — SIROLIMUS LEVEL: Sirolimus (Rapamycin): 6.7 ng/mL (ref 3.0–20.0)

## 2022-08-11 ENCOUNTER — Ambulatory Visit (INDEPENDENT_AMBULATORY_CARE_PROVIDER_SITE_OTHER): Payer: Medicare PPO | Admitting: Podiatry

## 2022-08-11 ENCOUNTER — Ambulatory Visit
Admit: 2022-08-11 | Discharge: 2022-08-11 | Disposition: A | Discharge status: Home / Self Care | Payer: Medicare PPO | Attending: Nurse Practitioner | Admitting: Nurse Practitioner

## 2022-08-11 ENCOUNTER — Encounter: Payer: Self-pay | Admitting: Podiatry

## 2022-08-11 VITALS — BP 141/54 | HR 66

## 2022-08-11 DIAGNOSIS — M79675 Pain in left toe(s): Secondary | ICD-10-CM

## 2022-08-11 DIAGNOSIS — Z1231 Encounter for screening mammogram for malignant neoplasm of breast: Secondary | ICD-10-CM

## 2022-08-11 DIAGNOSIS — B351 Tinea unguium: Secondary | ICD-10-CM

## 2022-08-11 DIAGNOSIS — E1142 Type 2 diabetes mellitus with diabetic polyneuropathy: Secondary | ICD-10-CM | POA: Diagnosis not present

## 2022-08-11 DIAGNOSIS — M79674 Pain in right toe(s): Secondary | ICD-10-CM | POA: Diagnosis not present

## 2022-08-11 NOTE — Progress Notes (Signed)
This patient returns to my office for at risk foot care.  This patient requires this care by a professional since this patient will be at risk due to having diabetic neuropathy.  Patient doing well since her kidney transplant.  This patient is unable to cut her  nails  since the patient cannot reach her nails.These nails are painful walking and wearing shoes.  This patient presents for at risk foot care today.  General Appearance  Alert, conversant and in no acute stress.  Vascular  Dorsalis pedis and posterior tibial  pulses are palpable  bilaterally.  Capillary return is within normal limits  bilaterally. Temperature is within normal limits  bilaterally.  Neurologic  Senn-Weinstein monofilament wire test diminished   bilaterally. Muscle power within normal limits bilaterally.  Nails Thick disfigured discolored nails with subungual debris  from hallux to fifth toes bilaterally. No evidence of bacterial infection or drainage bilaterally.  Orthopedic  No limitations of motion  feet .  No crepitus or effusions noted.  No bony pathology or digital deformities noted.  Skin  normotropic skin with no porokeratosis noted bilaterally.  No signs of infections or ulcers noted.     Onychomycosis  Pain in right toes  Pain in left toes  Consent was obtained for treatment procedures.   Mechanical debridement of nails 1-5  bilaterally performed with a nail nipper.  Filed with dremel without incident.    Return office visit  3 months                      Told patient to return for periodic foot care and evaluation due to potential at risk complications.   Lilliemae Fruge DPM  

## 2022-08-18 ENCOUNTER — Ambulatory Visit (HOSPITAL_COMMUNITY)
Admission: RE | Admit: 2022-08-18 | Discharge: 2022-08-18 | Disposition: A | Discharge status: Home / Self Care | Payer: Medicare PPO | Source: Ambulatory Visit | Attending: Internal Medicine | Admitting: Internal Medicine

## 2022-08-18 DIAGNOSIS — Z940 Kidney transplant status: Secondary | ICD-10-CM | POA: Diagnosis present

## 2022-08-18 LAB — CBC WITH DIFFERENTIAL/PLATELET
Abs Immature Granulocytes: 0.05 10*3/uL (ref 0.00–0.07)
Basophils Absolute: 0 10*3/uL (ref 0.0–0.1)
Basophils Relative: 1 %
Eosinophils Absolute: 0.1 10*3/uL (ref 0.0–0.5)
Eosinophils Relative: 4 %
HCT: 31.8 % — ABNORMAL LOW (ref 36.0–46.0)
Hemoglobin: 9.7 g/dL — ABNORMAL LOW (ref 12.0–15.0)
Immature Granulocytes: 2 %
Lymphocytes Relative: 37 %
Lymphs Abs: 1.1 10*3/uL (ref 0.7–4.0)
MCH: 25.9 pg — ABNORMAL LOW (ref 26.0–34.0)
MCHC: 30.5 g/dL (ref 30.0–36.0)
MCV: 85 fL (ref 80.0–100.0)
Monocytes Absolute: 0.5 10*3/uL (ref 0.1–1.0)
Monocytes Relative: 16 %
Neutro Abs: 1.2 10*3/uL — ABNORMAL LOW (ref 1.7–7.7)
Neutrophils Relative %: 40 %
Platelets: 304 10*3/uL (ref 150–400)
RBC: 3.74 MIL/uL — ABNORMAL LOW (ref 3.87–5.11)
RDW: 17.2 % — ABNORMAL HIGH (ref 11.5–15.5)
WBC: 2.9 10*3/uL — ABNORMAL LOW (ref 4.0–10.5)
nRBC: 0 % (ref 0.0–0.2)

## 2022-08-18 LAB — URINALYSIS, COMPLETE (UACMP) WITH MICROSCOPIC
Bilirubin Urine: NEGATIVE
Glucose, UA: NEGATIVE mg/dL
Hgb urine dipstick: NEGATIVE
Ketones, ur: NEGATIVE mg/dL
Nitrite: NEGATIVE
Protein, ur: NEGATIVE mg/dL
Specific Gravity, Urine: 1.014 (ref 1.005–1.030)
pH: 5 (ref 5.0–8.0)

## 2022-08-18 LAB — PROTEIN / CREATININE RATIO, URINE
Creatinine, Urine: 115 mg/dL
Protein Creatinine Ratio: 0.07 mg/mg{Cre} (ref 0.00–0.15)
Total Protein, Urine: 8 mg/dL

## 2022-08-18 MED ORDER — SODIUM CHLORIDE 0.9 % IV SOLN
5.0000 mg/kg | INTRAVENOUS | Status: DC
  Administered 2022-08-18: 350 mg via INTRAVENOUS
  Filled 2022-08-18: qty 350

## 2022-08-20 LAB — SIROLIMUS LEVEL: Sirolimus (Rapamycin): 4.8 ng/mL (ref 3.0–20.0)

## 2022-09-05 ENCOUNTER — Other Ambulatory Visit (HOSPITAL_COMMUNITY): Payer: Self-pay

## 2022-09-15 ENCOUNTER — Ambulatory Visit (HOSPITAL_COMMUNITY)
Admission: RE | Admit: 2022-09-15 | Discharge: 2022-09-15 | Disposition: A | Discharge status: Home / Self Care | Payer: Medicare PPO | Source: Ambulatory Visit | Attending: Internal Medicine | Admitting: Internal Medicine

## 2022-09-15 DIAGNOSIS — Z940 Kidney transplant status: Secondary | ICD-10-CM

## 2022-09-15 LAB — URINALYSIS, COMPLETE (UACMP) WITH MICROSCOPIC
Bacteria, UA: NONE SEEN
Bilirubin Urine: NEGATIVE
Glucose, UA: NEGATIVE mg/dL
Hgb urine dipstick: NEGATIVE
Ketones, ur: NEGATIVE mg/dL
Nitrite: NEGATIVE
Protein, ur: NEGATIVE mg/dL
Specific Gravity, Urine: 1.014 (ref 1.005–1.030)
pH: 5 (ref 5.0–8.0)

## 2022-09-15 LAB — CBC WITH DIFFERENTIAL/PLATELET
Abs Immature Granulocytes: 0.11 10*3/uL — ABNORMAL HIGH (ref 0.00–0.07)
Basophils Absolute: 0 10*3/uL (ref 0.0–0.1)
Basophils Relative: 1 %
Eosinophils Absolute: 0.1 10*3/uL (ref 0.0–0.5)
Eosinophils Relative: 3 %
HCT: 34.7 % — ABNORMAL LOW (ref 36.0–46.0)
Hemoglobin: 10.4 g/dL — ABNORMAL LOW (ref 12.0–15.0)
Immature Granulocytes: 3 %
Lymphocytes Relative: 36 %
Lymphs Abs: 1.3 10*3/uL (ref 0.7–4.0)
MCH: 25.4 pg — ABNORMAL LOW (ref 26.0–34.0)
MCHC: 30 g/dL (ref 30.0–36.0)
MCV: 84.6 fL (ref 80.0–100.0)
Monocytes Absolute: 0.6 10*3/uL (ref 0.1–1.0)
Monocytes Relative: 17 %
Neutro Abs: 1.4 10*3/uL — ABNORMAL LOW (ref 1.7–7.7)
Neutrophils Relative %: 40 %
Platelets: 227 10*3/uL (ref 150–400)
RBC: 4.1 MIL/uL (ref 3.87–5.11)
RDW: 18.6 % — ABNORMAL HIGH (ref 11.5–15.5)
WBC: 3.5 10*3/uL — ABNORMAL LOW (ref 4.0–10.5)
nRBC: 0 % (ref 0.0–0.2)

## 2022-09-15 LAB — PROTEIN / CREATININE RATIO, URINE
Creatinine, Urine: 93 mg/dL
Protein Creatinine Ratio: 0.09 mg/mg{Cre} (ref 0.00–0.15)
Total Protein, Urine: 8 mg/dL

## 2022-09-15 LAB — BASIC METABOLIC PANEL
Anion gap: 7 (ref 5–15)
BUN: 28 mg/dL — ABNORMAL HIGH (ref 8–23)
CO2: 24 mmol/L (ref 22–32)
Calcium: 9.6 mg/dL (ref 8.9–10.3)
Chloride: 110 mmol/L (ref 98–111)
Creatinine, Ser: 1.11 mg/dL — ABNORMAL HIGH (ref 0.44–1.00)
GFR, Estimated: 53 mL/min — ABNORMAL LOW (ref >60)
Glucose, Bld: 153 mg/dL — ABNORMAL HIGH (ref 70–99)
Potassium: 4.4 mmol/L (ref 3.5–5.1)
Sodium: 141 mmol/L (ref 135–145)

## 2022-09-15 MED ORDER — SODIUM CHLORIDE 0.9 % IV SOLN
5.0000 mg/kg | INTRAVENOUS | Status: DC
  Administered 2022-09-15: 350 mg via INTRAVENOUS
  Filled 2022-09-15: qty 350

## 2022-09-18 LAB — SIROLIMUS LEVEL: Sirolimus (Rapamycin): 4.2 ng/mL (ref 3.0–20.0)

## 2022-10-12 ENCOUNTER — Ambulatory Visit (HOSPITAL_COMMUNITY)
Admission: RE | Admit: 2022-10-12 | Discharge: 2022-10-12 | Disposition: A | Discharge status: Home / Self Care | Payer: Medicare PPO | Source: Ambulatory Visit | Attending: Internal Medicine | Admitting: Internal Medicine

## 2022-10-12 VITALS — BP 145/53 | HR 63 | Temp 97.7°F | Resp 18 | Wt 155.0 lb

## 2022-10-12 DIAGNOSIS — Z940 Kidney transplant status: Secondary | ICD-10-CM | POA: Diagnosis not present

## 2022-10-12 LAB — URINALYSIS, COMPLETE (UACMP) WITH MICROSCOPIC
Bilirubin Urine: NEGATIVE
Glucose, UA: NEGATIVE mg/dL
Hgb urine dipstick: NEGATIVE
Ketones, ur: NEGATIVE mg/dL
Nitrite: NEGATIVE
Protein, ur: NEGATIVE mg/dL
Specific Gravity, Urine: 1.025 (ref 1.005–1.030)
pH: 5.5 (ref 5.0–8.0)

## 2022-10-12 LAB — CBC WITH DIFFERENTIAL/PLATELET
Abs Immature Granulocytes: 0.06 10*3/uL (ref 0.00–0.07)
Basophils Absolute: 0 10*3/uL (ref 0.0–0.1)
Basophils Relative: 1 %
Eosinophils Absolute: 0.1 10*3/uL (ref 0.0–0.5)
Eosinophils Relative: 4 %
HCT: 36 % (ref 36.0–46.0)
Hemoglobin: 10.7 g/dL — ABNORMAL LOW (ref 12.0–15.0)
Immature Granulocytes: 2 %
Lymphocytes Relative: 35 %
Lymphs Abs: 1 10*3/uL (ref 0.7–4.0)
MCH: 25.4 pg — ABNORMAL LOW (ref 26.0–34.0)
MCHC: 29.7 g/dL — ABNORMAL LOW (ref 30.0–36.0)
MCV: 85.3 fL (ref 80.0–100.0)
Monocytes Absolute: 0.5 10*3/uL (ref 0.1–1.0)
Monocytes Relative: 17 %
Neutro Abs: 1.3 10*3/uL — ABNORMAL LOW (ref 1.7–7.7)
Neutrophils Relative %: 41 %
Platelets: 198 10*3/uL (ref 150–400)
RBC: 4.22 MIL/uL (ref 3.87–5.11)
RDW: 18.8 % — ABNORMAL HIGH (ref 11.5–15.5)
WBC: 3 10*3/uL — ABNORMAL LOW (ref 4.0–10.5)
nRBC: 0 % (ref 0.0–0.2)

## 2022-10-12 LAB — BASIC METABOLIC PANEL
Anion gap: 13 (ref 5–15)
BUN: 38 mg/dL — ABNORMAL HIGH (ref 8–23)
CO2: 19 mmol/L — ABNORMAL LOW (ref 22–32)
Calcium: 9.9 mg/dL (ref 8.9–10.3)
Chloride: 108 mmol/L (ref 98–111)
Creatinine, Ser: 1.17 mg/dL — ABNORMAL HIGH (ref 0.44–1.00)
GFR, Estimated: 50 mL/min — ABNORMAL LOW (ref >60)
Glucose, Bld: 224 mg/dL — ABNORMAL HIGH (ref 70–99)
Potassium: 4.8 mmol/L (ref 3.5–5.1)
Sodium: 140 mmol/L (ref 135–145)

## 2022-10-12 LAB — PROTEIN / CREATININE RATIO, URINE
Creatinine, Urine: 61 mg/dL
Total Protein, Urine: <6 mg/dL

## 2022-10-12 MED ORDER — SODIUM CHLORIDE 0.9 % IV SOLN
5.0000 mg/kg | INTRAVENOUS | Status: DC
  Administered 2022-10-12: 350 mg via INTRAVENOUS
  Filled 2022-10-12: qty 350

## 2022-10-13 ENCOUNTER — Encounter (HOSPITAL_COMMUNITY): Payer: Medicare PPO

## 2022-10-15 LAB — SIROLIMUS LEVEL
Sirolimus (Rapamycin): 6.1 ng/mL (ref 3.0–20.0)
Sirolimus (Rapamycin): 6 ng/mL (ref 3.0–20.0)

## 2022-11-06 ENCOUNTER — Other Ambulatory Visit (HOSPITAL_COMMUNITY): Payer: Self-pay

## 2022-11-06 DIAGNOSIS — Z940 Kidney transplant status: Secondary | ICD-10-CM

## 2022-11-09 ENCOUNTER — Ambulatory Visit (HOSPITAL_COMMUNITY)
Admission: RE | Admit: 2022-11-09 | Discharge: 2022-11-09 | Disposition: A | Discharge status: Home / Self Care | Payer: Medicare PPO | Source: Ambulatory Visit | Attending: Internal Medicine | Admitting: Internal Medicine

## 2022-11-09 DIAGNOSIS — Z940 Kidney transplant status: Secondary | ICD-10-CM | POA: Diagnosis present

## 2022-11-09 LAB — CBC WITH DIFFERENTIAL/PLATELET
Abs Immature Granulocytes: 0.15 10*3/uL — ABNORMAL HIGH (ref 0.00–0.07)
Basophils Absolute: 0 10*3/uL (ref 0.0–0.1)
Basophils Relative: 1 %
Eosinophils Absolute: 0.1 10*3/uL (ref 0.0–0.5)
Eosinophils Relative: 2 %
HCT: 36.5 % (ref 36.0–46.0)
Hemoglobin: 11 g/dL — ABNORMAL LOW (ref 12.0–15.0)
Immature Granulocytes: 5 %
Lymphocytes Relative: 33 %
Lymphs Abs: 1 10*3/uL (ref 0.7–4.0)
MCH: 25.6 pg — ABNORMAL LOW (ref 26.0–34.0)
MCHC: 30.1 g/dL (ref 30.0–36.0)
MCV: 84.9 fL (ref 80.0–100.0)
Monocytes Absolute: 0.5 10*3/uL (ref 0.1–1.0)
Monocytes Relative: 16 %
Neutro Abs: 1.2 10*3/uL — ABNORMAL LOW (ref 1.7–7.7)
Neutrophils Relative %: 43 %
Platelets: 170 10*3/uL (ref 150–400)
RBC: 4.3 MIL/uL (ref 3.87–5.11)
RDW: 18.6 % — ABNORMAL HIGH (ref 11.5–15.5)
WBC: 2.9 10*3/uL — ABNORMAL LOW (ref 4.0–10.5)
nRBC: 0 % (ref 0.0–0.2)

## 2022-11-09 LAB — BASIC METABOLIC PANEL
Anion gap: 7 (ref 5–15)
BUN: 27 mg/dL — ABNORMAL HIGH (ref 8–23)
CO2: 21 mmol/L — ABNORMAL LOW (ref 22–32)
Calcium: 9.4 mg/dL (ref 8.9–10.3)
Chloride: 113 mmol/L — ABNORMAL HIGH (ref 98–111)
Creatinine, Ser: 1.02 mg/dL — ABNORMAL HIGH (ref 0.44–1.00)
GFR, Estimated: 59 mL/min — ABNORMAL LOW (ref >60)
Glucose, Bld: 130 mg/dL — ABNORMAL HIGH (ref 70–99)
Potassium: 4.1 mmol/L (ref 3.5–5.1)
Sodium: 141 mmol/L (ref 135–145)

## 2022-11-09 MED ORDER — SODIUM CHLORIDE 0.9 % IV SOLN
5.0000 mg/kg | INTRAVENOUS | Status: DC
  Administered 2022-11-09: 375 mg via INTRAVENOUS
  Filled 2022-11-09: qty 375

## 2022-11-11 LAB — BK QUANT PCR (PLASMA/SERUM): BK Quantitaion PCR: NEGATIVE IU/mL

## 2022-11-11 LAB — CMV DNA, QUANTITATIVE, PCR
CMV DNA Quant: NEGATIVE IU/mL
Log10 CMV Qn DNA Pl: UNDETERMINED log10 IU/mL

## 2022-11-12 LAB — SIROLIMUS LEVEL: Sirolimus (Rapamycin): 4.6 ng/mL (ref 3.0–20.0)

## 2022-12-07 ENCOUNTER — Ambulatory Visit (HOSPITAL_COMMUNITY)
Admission: RE | Admit: 2022-12-07 | Discharge: 2022-12-07 | Disposition: A | Discharge status: Home / Self Care | Payer: Medicare PPO | Source: Ambulatory Visit | Attending: Internal Medicine | Admitting: Internal Medicine

## 2022-12-07 DIAGNOSIS — Z940 Kidney transplant status: Secondary | ICD-10-CM | POA: Diagnosis not present

## 2022-12-07 LAB — URINALYSIS, COMPLETE (UACMP) WITH MICROSCOPIC
Bilirubin Urine: NEGATIVE
Glucose, UA: NEGATIVE mg/dL
Ketones, ur: NEGATIVE mg/dL
Nitrite: NEGATIVE
Protein, ur: NEGATIVE mg/dL
Specific Gravity, Urine: 1.025 (ref 1.005–1.030)
pH: 6 (ref 5.0–8.0)

## 2022-12-07 LAB — CBC WITH DIFFERENTIAL/PLATELET
Abs Immature Granulocytes: 0 10*3/uL (ref 0.00–0.07)
Basophils Absolute: 0 10*3/uL (ref 0.0–0.1)
Basophils Relative: 0 %
Eosinophils Absolute: 0.1 10*3/uL (ref 0.0–0.5)
Eosinophils Relative: 2 %
HCT: 37.2 % (ref 36.0–46.0)
Hemoglobin: 11.3 g/dL — ABNORMAL LOW (ref 12.0–15.0)
Lymphocytes Relative: 38 %
Lymphs Abs: 1.2 10*3/uL (ref 0.7–4.0)
MCH: 25.1 pg — ABNORMAL LOW (ref 26.0–34.0)
MCHC: 30.4 g/dL (ref 30.0–36.0)
MCV: 82.5 fL (ref 80.0–100.0)
Monocytes Absolute: 0.4 10*3/uL (ref 0.1–1.0)
Monocytes Relative: 12 %
Neutro Abs: 1.5 10*3/uL — ABNORMAL LOW (ref 1.7–7.7)
Neutrophils Relative %: 48 %
Platelets: 213 10*3/uL (ref 150–400)
RBC: 4.51 MIL/uL (ref 3.87–5.11)
RDW: 17.6 % — ABNORMAL HIGH (ref 11.5–15.5)
WBC: 3.2 10*3/uL — ABNORMAL LOW (ref 4.0–10.5)
nRBC: 0 % (ref 0.0–0.2)

## 2022-12-07 LAB — PROTEIN / CREATININE RATIO, URINE
Creatinine, Urine: 145 mg/dL
Protein Creatinine Ratio: 0.18 mg/mg{Cre} — ABNORMAL HIGH (ref 0.00–0.15)
Total Protein, Urine: 26 mg/dL

## 2022-12-07 LAB — BASIC METABOLIC PANEL
Anion gap: 7 (ref 5–15)
BUN: 21 mg/dL (ref 8–23)
CO2: 23 mmol/L (ref 22–32)
Calcium: 9.5 mg/dL (ref 8.9–10.3)
Chloride: 111 mmol/L (ref 98–111)
Creatinine, Ser: 0.98 mg/dL (ref 0.44–1.00)
GFR, Estimated: >60 mL/min (ref >60)
Glucose, Bld: 155 mg/dL — ABNORMAL HIGH (ref 70–99)
Potassium: 4.8 mmol/L (ref 3.5–5.1)
Sodium: 141 mmol/L (ref 135–145)

## 2022-12-07 MED ORDER — SODIUM CHLORIDE 0.9 % IV SOLN
5.0000 mg/kg | INTRAVENOUS | Status: DC
  Administered 2022-12-07: 375 mg via INTRAVENOUS
  Filled 2022-12-07: qty 375

## 2023-01-04 ENCOUNTER — Ambulatory Visit (HOSPITAL_COMMUNITY)
Admission: RE | Admit: 2023-01-04 | Discharge: 2023-01-04 | Disposition: A | Discharge status: Home / Self Care | Payer: Medicare PPO | Source: Ambulatory Visit | Attending: Internal Medicine | Admitting: Internal Medicine

## 2023-01-04 VITALS — BP 141/52 | HR 62 | Temp 98.4°F | Resp 18 | Wt 153.0 lb

## 2023-01-04 DIAGNOSIS — Z940 Kidney transplant status: Secondary | ICD-10-CM | POA: Diagnosis present

## 2023-01-04 DIAGNOSIS — Z79899 Other long term (current) drug therapy: Secondary | ICD-10-CM | POA: Insufficient documentation

## 2023-01-04 LAB — BASIC METABOLIC PANEL
Anion gap: 8 (ref 5–15)
BUN: 19 mg/dL (ref 8–23)
CO2: 21 mmol/L — ABNORMAL LOW (ref 22–32)
Calcium: 9.1 mg/dL (ref 8.9–10.3)
Chloride: 109 mmol/L (ref 98–111)
Creatinine, Ser: 0.93 mg/dL (ref 0.44–1.00)
GFR, Estimated: >60 mL/min (ref >60)
Glucose, Bld: 108 mg/dL — ABNORMAL HIGH (ref 70–99)
Potassium: 3.9 mmol/L (ref 3.5–5.1)
Sodium: 138 mmol/L (ref 135–145)

## 2023-01-04 LAB — CBC WITH DIFFERENTIAL/PLATELET
Abs Immature Granulocytes: 0.18 10*3/uL — ABNORMAL HIGH (ref 0.00–0.07)
Basophils Absolute: 0 10*3/uL (ref 0.0–0.1)
Basophils Relative: 1 %
Eosinophils Absolute: 0.1 10*3/uL (ref 0.0–0.5)
Eosinophils Relative: 3 %
HCT: 33.7 % — ABNORMAL LOW (ref 36.0–46.0)
Hemoglobin: 10.2 g/dL — ABNORMAL LOW (ref 12.0–15.0)
Immature Granulocytes: 6 %
Lymphocytes Relative: 34 %
Lymphs Abs: 1.1 10*3/uL (ref 0.7–4.0)
MCH: 24.5 pg — ABNORMAL LOW (ref 26.0–34.0)
MCHC: 30.3 g/dL (ref 30.0–36.0)
MCV: 80.8 fL (ref 80.0–100.0)
Monocytes Absolute: 0.5 10*3/uL (ref 0.1–1.0)
Monocytes Relative: 17 %
Neutro Abs: 1.2 10*3/uL — ABNORMAL LOW (ref 1.7–7.7)
Neutrophils Relative %: 39 %
Platelets: 228 10*3/uL (ref 150–400)
RBC: 4.17 MIL/uL (ref 3.87–5.11)
RDW: 17.4 % — ABNORMAL HIGH (ref 11.5–15.5)
WBC: 3.1 10*3/uL — ABNORMAL LOW (ref 4.0–10.5)
nRBC: 0 % (ref 0.0–0.2)

## 2023-01-04 LAB — URINALYSIS, COMPLETE (UACMP) WITH MICROSCOPIC
Bacteria, UA: NONE SEEN
Bilirubin Urine: NEGATIVE
Glucose, UA: NEGATIVE mg/dL
Hgb urine dipstick: NEGATIVE
Ketones, ur: NEGATIVE mg/dL
Nitrite: NEGATIVE
Protein, ur: 30 mg/dL — AB
Specific Gravity, Urine: 1.018 (ref 1.005–1.030)
pH: 5 (ref 5.0–8.0)

## 2023-01-04 LAB — PROTEIN / CREATININE RATIO, URINE
Creatinine, Urine: 117 mg/dL
Protein Creatinine Ratio: 0.21 mg/mg{creat} — ABNORMAL HIGH (ref 0.00–0.15)
Total Protein, Urine: 24 mg/dL

## 2023-01-04 MED ORDER — SODIUM CHLORIDE 0.9 % IV SOLN
5.0000 mg/kg | INTRAVENOUS | Status: DC
  Administered 2023-01-04: 350 mg via INTRAVENOUS
  Filled 2023-01-04: qty 350

## 2023-01-05 LAB — CMV DNA, QUANTITATIVE, PCR
CMV DNA Quant: NEGATIVE [IU]/mL
Log10 CMV Qn DNA Pl: UNDETERMINED {Log_IU}/mL

## 2023-01-05 LAB — BK QUANT PCR (PLASMA/SERUM): BK Quantitaion PCR: POSITIVE [IU]/mL

## 2023-01-06 LAB — SIROLIMUS LEVEL: Sirolimus (Rapamycin): 5.3 ng/mL (ref 3.0–20.0)

## 2023-01-12 ENCOUNTER — Ambulatory Visit (INDEPENDENT_AMBULATORY_CARE_PROVIDER_SITE_OTHER): Payer: Medicare PPO | Admitting: Podiatry

## 2023-01-12 ENCOUNTER — Encounter: Payer: Self-pay | Admitting: Podiatry

## 2023-01-12 DIAGNOSIS — E1142 Type 2 diabetes mellitus with diabetic polyneuropathy: Secondary | ICD-10-CM | POA: Diagnosis not present

## 2023-01-12 DIAGNOSIS — B351 Tinea unguium: Secondary | ICD-10-CM

## 2023-01-12 DIAGNOSIS — M79674 Pain in right toe(s): Secondary | ICD-10-CM | POA: Diagnosis not present

## 2023-01-12 DIAGNOSIS — M79675 Pain in left toe(s): Secondary | ICD-10-CM

## 2023-01-12 NOTE — Progress Notes (Signed)
This patient returns to my office for at risk foot care.  This patient requires this care by a professional since this patient will be at risk due to having diabetic neuropathy.  Patient doing well since her kidney transplant.  This patient is unable to cut her  nails  since the patient cannot reach her nails.These nails are painful walking and wearing shoes.  This patient presents for at risk foot care today.  General Appearance  Alert, conversant and in no acute stress.  Vascular  Dorsalis pedis and posterior tibial  pulses are palpable  bilaterally.  Capillary return is within normal limits  bilaterally. Temperature is within normal limits  bilaterally.  Neurologic  Senn-Weinstein monofilament wire test diminished   bilaterally. Muscle power within normal limits bilaterally.  Nails Thick disfigured discolored nails with subungual debris  from hallux to fifth toes bilaterally. No evidence of bacterial infection or drainage bilaterally.  Orthopedic  No limitations of motion  feet .  No crepitus or effusions noted.  No bony pathology or digital deformities noted.  Skin  normotropic skin with no porokeratosis noted bilaterally.  No signs of infections or ulcers noted.     Onychomycosis  Pain in right toes  Pain in left toes  Consent was obtained for treatment procedures.   Mechanical debridement of nails 1-5  bilaterally performed with a nail nipper.  Filed with dremel without incident.    Return office visit  3 months                      Told patient to return for periodic foot care and evaluation due to potential at risk complications.   Helane Gunther DPM

## 2023-02-01 ENCOUNTER — Encounter (HOSPITAL_COMMUNITY)
Admission: RE | Admit: 2023-02-01 | Discharge: 2023-02-01 | Disposition: A | Discharge status: Home / Self Care | Payer: Medicare PPO | Source: Ambulatory Visit | Attending: Internal Medicine | Admitting: Internal Medicine

## 2023-02-01 VITALS — BP 161/57 | HR 59 | Temp 98.0°F | Resp 17 | Wt 153.0 lb

## 2023-02-01 DIAGNOSIS — Z79899 Other long term (current) drug therapy: Secondary | ICD-10-CM | POA: Diagnosis present

## 2023-02-01 DIAGNOSIS — Z940 Kidney transplant status: Secondary | ICD-10-CM | POA: Insufficient documentation

## 2023-02-01 LAB — CBC WITH DIFFERENTIAL/PLATELET
Abs Immature Granulocytes: 0.15 10*3/uL — ABNORMAL HIGH (ref 0.00–0.07)
Basophils Absolute: 0 10*3/uL (ref 0.0–0.1)
Basophils Relative: 1 %
Eosinophils Absolute: 0.1 10*3/uL (ref 0.0–0.5)
Eosinophils Relative: 3 %
HCT: 35.3 % — ABNORMAL LOW (ref 36.0–46.0)
Hemoglobin: 10.6 g/dL — ABNORMAL LOW (ref 12.0–15.0)
Immature Granulocytes: 5 %
Lymphocytes Relative: 35 %
Lymphs Abs: 1 10*3/uL (ref 0.7–4.0)
MCH: 25.1 pg — ABNORMAL LOW (ref 26.0–34.0)
MCHC: 30 g/dL (ref 30.0–36.0)
MCV: 83.5 fL (ref 80.0–100.0)
Monocytes Absolute: 0.5 10*3/uL (ref 0.1–1.0)
Monocytes Relative: 16 %
Neutro Abs: 1.1 10*3/uL — ABNORMAL LOW (ref 1.7–7.7)
Neutrophils Relative %: 40 %
Platelets: 212 10*3/uL (ref 150–400)
RBC: 4.23 MIL/uL (ref 3.87–5.11)
RDW: 18.9 % — ABNORMAL HIGH (ref 11.5–15.5)
WBC: 2.8 10*3/uL — ABNORMAL LOW (ref 4.0–10.5)
nRBC: 0 % (ref 0.0–0.2)

## 2023-02-01 LAB — BASIC METABOLIC PANEL
Anion gap: 8 (ref 5–15)
BUN: 17 mg/dL (ref 8–23)
CO2: 22 mmol/L (ref 22–32)
Calcium: 9.4 mg/dL (ref 8.9–10.3)
Chloride: 110 mmol/L (ref 98–111)
Creatinine, Ser: 0.84 mg/dL (ref 0.44–1.00)
GFR, Estimated: >60 mL/min (ref >60)
Glucose, Bld: 147 mg/dL — ABNORMAL HIGH (ref 70–99)
Potassium: 3.9 mmol/L (ref 3.5–5.1)
Sodium: 140 mmol/L (ref 135–145)

## 2023-02-01 LAB — URINALYSIS, COMPLETE (UACMP) WITH MICROSCOPIC
Bacteria, UA: NONE SEEN
Bilirubin Urine: NEGATIVE
Glucose, UA: 150 mg/dL — AB
Hgb urine dipstick: NEGATIVE
Ketones, ur: NEGATIVE mg/dL
Nitrite: NEGATIVE
Protein, ur: 30 mg/dL — AB
Specific Gravity, Urine: 1.017 (ref 1.005–1.030)
pH: 5 (ref 5.0–8.0)

## 2023-02-01 LAB — PROTEIN / CREATININE RATIO, URINE
Creatinine, Urine: 101 mg/dL
Protein Creatinine Ratio: 0.36 mg/mg{creat} — ABNORMAL HIGH (ref 0.00–0.15)
Total Protein, Urine: 36 mg/dL

## 2023-02-01 MED ORDER — SODIUM CHLORIDE 0.9 % IV SOLN
5.0000 mg/kg | INTRAVENOUS | Status: DC
  Administered 2023-02-01: 350 mg via INTRAVENOUS
  Filled 2023-02-01: qty 350

## 2023-02-02 LAB — CMV DNA, QUANTITATIVE, PCR
CMV DNA Quant: NEGATIVE [IU]/mL
Log10 CMV Qn DNA Pl: UNDETERMINED {Log}

## 2023-02-02 LAB — BK QUANT PCR (PLASMA/SERUM)
BK Quantitaion PCR: 36 [IU]/mL
Log10 BK Qn PCR: 1.556 {Log}

## 2023-02-04 LAB — SIROLIMUS LEVEL: Sirolimus (Rapamycin): 4.1 ng/mL (ref 3.0–20.0)

## 2023-02-28 ENCOUNTER — Other Ambulatory Visit (HOSPITAL_COMMUNITY): Payer: Self-pay | Admitting: *Deleted

## 2023-02-28 DIAGNOSIS — Z949 Transplanted organ and tissue status, unspecified: Secondary | ICD-10-CM

## 2023-03-01 ENCOUNTER — Ambulatory Visit (HOSPITAL_COMMUNITY)
Admission: RE | Admit: 2023-03-01 | Discharge: 2023-03-01 | Disposition: A | Discharge status: Home / Self Care | Payer: Medicare PPO | Source: Ambulatory Visit | Attending: Internal Medicine | Admitting: Internal Medicine

## 2023-03-01 VITALS — BP 141/49 | HR 62 | Temp 98.0°F | Resp 16 | Wt 155.0 lb

## 2023-03-01 DIAGNOSIS — Z940 Kidney transplant status: Secondary | ICD-10-CM | POA: Insufficient documentation

## 2023-03-01 DIAGNOSIS — Z949 Transplanted organ and tissue status, unspecified: Secondary | ICD-10-CM | POA: Diagnosis present

## 2023-03-01 LAB — CBC WITH DIFFERENTIAL/PLATELET
Abs Immature Granulocytes: 0.19 10*3/uL — ABNORMAL HIGH (ref 0.00–0.07)
Basophils Absolute: 0 10*3/uL (ref 0.0–0.1)
Basophils Relative: 1 %
Eosinophils Absolute: 0.1 10*3/uL (ref 0.0–0.5)
Eosinophils Relative: 2 %
HCT: 35.4 % — ABNORMAL LOW (ref 36.0–46.0)
Hemoglobin: 10.6 g/dL — ABNORMAL LOW (ref 12.0–15.0)
Immature Granulocytes: 5 %
Lymphocytes Relative: 33 %
Lymphs Abs: 1.2 10*3/uL (ref 0.7–4.0)
MCH: 25.2 pg — ABNORMAL LOW (ref 26.0–34.0)
MCHC: 29.9 g/dL — ABNORMAL LOW (ref 30.0–36.0)
MCV: 84.1 fL (ref 80.0–100.0)
Monocytes Absolute: 0.5 10*3/uL (ref 0.1–1.0)
Monocytes Relative: 15 %
Neutro Abs: 1.6 10*3/uL — ABNORMAL LOW (ref 1.7–7.7)
Neutrophils Relative %: 44 %
Platelets: 228 10*3/uL (ref 150–400)
RBC: 4.21 MIL/uL (ref 3.87–5.11)
RDW: 19.5 % — ABNORMAL HIGH (ref 11.5–15.5)
WBC: 3.6 10*3/uL — ABNORMAL LOW (ref 4.0–10.5)
nRBC: 0 % (ref 0.0–0.2)

## 2023-03-01 LAB — URINALYSIS, COMPLETE (UACMP) WITH MICROSCOPIC
Bilirubin Urine: NEGATIVE
Glucose, UA: NEGATIVE mg/dL
Hgb urine dipstick: NEGATIVE
Ketones, ur: NEGATIVE mg/dL
Nitrite: NEGATIVE
Protein, ur: 30 mg/dL — AB
Specific Gravity, Urine: 1.019 (ref 1.005–1.030)
pH: 5 (ref 5.0–8.0)

## 2023-03-01 LAB — BASIC METABOLIC PANEL
Anion gap: 8 (ref 5–15)
BUN: 22 mg/dL (ref 8–23)
CO2: 19 mmol/L — ABNORMAL LOW (ref 22–32)
Calcium: 9.8 mg/dL (ref 8.9–10.3)
Chloride: 116 mmol/L — ABNORMAL HIGH (ref 98–111)
Creatinine, Ser: 0.99 mg/dL (ref 0.44–1.00)
GFR, Estimated: >60 mL/min (ref >60)
Glucose, Bld: 134 mg/dL — ABNORMAL HIGH (ref 70–99)
Potassium: 4.9 mmol/L (ref 3.5–5.1)
Sodium: 143 mmol/L (ref 135–145)

## 2023-03-01 LAB — PROTEIN / CREATININE RATIO, URINE
Creatinine, Urine: 164 mg/dL
Protein Creatinine Ratio: 0.3 mg/mg{creat} — ABNORMAL HIGH (ref 0.00–0.15)
Total Protein, Urine: 49 mg/dL

## 2023-03-01 MED ORDER — SODIUM CHLORIDE 0.9 % IV SOLN
5.0000 mg/kg | INTRAVENOUS | Status: DC
  Administered 2023-03-01: 350 mg via INTRAVENOUS
  Filled 2023-03-01: qty 350

## 2023-03-02 LAB — BK QUANT PCR (PLASMA/SERUM): BK Quantitaion PCR: NEGATIVE [IU]/mL

## 2023-03-03 LAB — CMV DNA BY PCR, QUALITATIVE: CMV DNA, Qual PCR: NEGATIVE

## 2023-03-03 LAB — SIROLIMUS LEVEL: Sirolimus (Rapamycin): 6.2 ng/mL (ref 3.0–20.0)

## 2023-03-28 ENCOUNTER — Other Ambulatory Visit (HOSPITAL_COMMUNITY): Payer: Self-pay | Admitting: *Deleted

## 2023-03-30 ENCOUNTER — Ambulatory Visit (HOSPITAL_COMMUNITY)
Admission: RE | Admit: 2023-03-30 | Discharge: 2023-03-30 | Disposition: A | Discharge status: Home / Self Care | Payer: Medicare PPO | Source: Ambulatory Visit | Attending: Internal Medicine | Admitting: Internal Medicine

## 2023-03-30 DIAGNOSIS — Z940 Kidney transplant status: Secondary | ICD-10-CM | POA: Diagnosis present

## 2023-03-30 MED ORDER — BELATACEPT 250 MG IV SOLR
5.0000 mg/kg | INTRAVENOUS | Status: DC
  Administered 2023-03-30: 350 mg via INTRAVENOUS
  Filled 2023-03-30: qty 350

## 2023-03-30 NOTE — Progress Notes (Signed)
Pt here today for nulojix.  She has a sinus infection and currently on the last day of amoxicillin, been taking it for 7 days.  Stated she is feeling better and no fever.  Spoke with Lauren from the transplant center at [facility].  She spoke with the on call MD and stated it was okay to proceed with her nulojix today.

## 2023-04-24 ENCOUNTER — Other Ambulatory Visit (HOSPITAL_COMMUNITY): Payer: Self-pay | Admitting: *Deleted

## 2023-04-27 ENCOUNTER — Ambulatory Visit (HOSPITAL_COMMUNITY)
Admission: RE | Admit: 2023-04-27 | Discharge: 2023-04-27 | Disposition: A | Discharge status: Home / Self Care | Payer: Medicare PPO | Source: Ambulatory Visit | Attending: Internal Medicine | Admitting: Internal Medicine

## 2023-04-27 DIAGNOSIS — Z940 Kidney transplant status: Secondary | ICD-10-CM | POA: Diagnosis present

## 2023-04-27 MED ORDER — SODIUM CHLORIDE 0.9 % IV SOLN
5.0000 mg/kg | INTRAVENOUS | Status: DC
  Administered 2023-04-27: 350 mg via INTRAVENOUS
  Filled 2023-04-27: qty 350

## 2023-05-14 ENCOUNTER — Ambulatory Visit: Payer: Medicare PPO | Admitting: Podiatry

## 2023-05-25 ENCOUNTER — Ambulatory Visit (HOSPITAL_COMMUNITY)
Admission: RE | Admit: 2023-05-25 | Discharge: 2023-05-25 | Disposition: A | Discharge status: Home / Self Care | Payer: Medicare Other | Source: Ambulatory Visit | Attending: Internal Medicine | Admitting: Internal Medicine

## 2023-05-25 VITALS — BP 162/51 | HR 60 | Temp 98.0°F | Resp 17 | Wt 154.0 lb

## 2023-05-25 DIAGNOSIS — Z940 Kidney transplant status: Secondary | ICD-10-CM | POA: Diagnosis present

## 2023-05-25 LAB — CBC WITH DIFFERENTIAL/PLATELET
Abs Immature Granulocytes: 0.16 10*3/uL — ABNORMAL HIGH (ref 0.00–0.07)
Basophils Absolute: 0 10*3/uL (ref 0.0–0.1)
Basophils Relative: 1 %
Eosinophils Absolute: 0.1 10*3/uL (ref 0.0–0.5)
Eosinophils Relative: 3 %
HCT: 36.5 % (ref 36.0–46.0)
Hemoglobin: 11 g/dL — ABNORMAL LOW (ref 12.0–15.0)
Immature Granulocytes: 5 %
Lymphocytes Relative: 39 %
Lymphs Abs: 1.2 10*3/uL (ref 0.7–4.0)
MCH: 25.2 pg — ABNORMAL LOW (ref 26.0–34.0)
MCHC: 30.1 g/dL (ref 30.0–36.0)
MCV: 83.7 fL (ref 80.0–100.0)
Monocytes Absolute: 0.4 10*3/uL (ref 0.1–1.0)
Monocytes Relative: 14 %
Neutro Abs: 1.2 10*3/uL — ABNORMAL LOW (ref 1.7–7.7)
Neutrophils Relative %: 38 %
Platelets: 207 10*3/uL (ref 150–400)
RBC: 4.36 MIL/uL (ref 3.87–5.11)
RDW: 18.6 % — ABNORMAL HIGH (ref 11.5–15.5)
WBC: 3.1 10*3/uL — ABNORMAL LOW (ref 4.0–10.5)
nRBC: 0 % (ref 0.0–0.2)

## 2023-05-25 LAB — URINALYSIS, COMPLETE (UACMP) WITH MICROSCOPIC
Bilirubin Urine: NEGATIVE
Glucose, UA: NEGATIVE mg/dL
Hgb urine dipstick: NEGATIVE
Ketones, ur: NEGATIVE mg/dL
Nitrite: NEGATIVE
Protein, ur: 100 mg/dL — AB
Specific Gravity, Urine: 1.02 (ref 1.005–1.030)
pH: 5 (ref 5.0–8.0)

## 2023-05-25 LAB — BASIC METABOLIC PANEL
Anion gap: 9 (ref 5–15)
BUN: 25 mg/dL — ABNORMAL HIGH (ref 8–23)
CO2: 21 mmol/L — ABNORMAL LOW (ref 22–32)
Calcium: 9.9 mg/dL (ref 8.9–10.3)
Chloride: 114 mmol/L — ABNORMAL HIGH (ref 98–111)
Creatinine, Ser: 1.02 mg/dL — ABNORMAL HIGH (ref 0.44–1.00)
GFR, Estimated: 59 mL/min — ABNORMAL LOW (ref >60)
Glucose, Bld: 120 mg/dL — ABNORMAL HIGH (ref 70–99)
Potassium: 4.8 mmol/L (ref 3.5–5.1)
Sodium: 144 mmol/L (ref 135–145)

## 2023-05-25 LAB — PROTEIN / CREATININE RATIO, URINE
Creatinine, Urine: 143 mg/dL
Protein Creatinine Ratio: 0.46 mg/mg{creat} — ABNORMAL HIGH (ref 0.00–0.15)
Total Protein, Urine: 66 mg/dL

## 2023-05-25 MED ORDER — BELATACEPT 250 MG IV SOLR
5.0000 mg/kg | INTRAVENOUS | Status: DC
  Administered 2023-05-25: 350 mg via INTRAVENOUS
  Filled 2023-05-25: qty 350

## 2023-05-28 LAB — SIROLIMUS LEVEL: Sirolimus (Rapamycin): 5 ng/mL (ref 3.0–20.0)

## 2023-06-19 ENCOUNTER — Inpatient Hospital Stay (HOSPITAL_COMMUNITY)
Admission: EM | Admit: 2023-06-19 | Discharge: 2023-06-21 | DRG: 690 | Disposition: A | Discharge status: Home / Self Care | Payer: Medicare Other | Attending: Internal Medicine | Admitting: Internal Medicine

## 2023-06-19 ENCOUNTER — Encounter (HOSPITAL_COMMUNITY): Payer: Self-pay

## 2023-06-19 ENCOUNTER — Emergency Department (HOSPITAL_COMMUNITY): Payer: Medicare Other

## 2023-06-19 ENCOUNTER — Other Ambulatory Visit: Payer: Self-pay

## 2023-06-19 DIAGNOSIS — H548 Legal blindness, as defined in USA: Secondary | ICD-10-CM | POA: Diagnosis present

## 2023-06-19 DIAGNOSIS — Z66 Do not resuscitate: Secondary | ICD-10-CM | POA: Diagnosis present

## 2023-06-19 DIAGNOSIS — Z7984 Long term (current) use of oral hypoglycemic drugs: Secondary | ICD-10-CM

## 2023-06-19 DIAGNOSIS — E11319 Type 2 diabetes mellitus with unspecified diabetic retinopathy without macular edema: Secondary | ICD-10-CM | POA: Diagnosis present

## 2023-06-19 DIAGNOSIS — N390 Urinary tract infection, site not specified: Principal | ICD-10-CM | POA: Diagnosis present

## 2023-06-19 DIAGNOSIS — Z79899 Other long term (current) drug therapy: Secondary | ICD-10-CM

## 2023-06-19 DIAGNOSIS — Z940 Kidney transplant status: Secondary | ICD-10-CM

## 2023-06-19 DIAGNOSIS — E1122 Type 2 diabetes mellitus with diabetic chronic kidney disease: Secondary | ICD-10-CM | POA: Diagnosis present

## 2023-06-19 DIAGNOSIS — Z87440 Personal history of urinary (tract) infections: Secondary | ICD-10-CM

## 2023-06-19 DIAGNOSIS — Z803 Family history of malignant neoplasm of breast: Secondary | ICD-10-CM

## 2023-06-19 DIAGNOSIS — Z9841 Cataract extraction status, right eye: Secondary | ICD-10-CM

## 2023-06-19 DIAGNOSIS — Z96651 Presence of right artificial knee joint: Secondary | ICD-10-CM | POA: Diagnosis present

## 2023-06-19 DIAGNOSIS — E039 Hypothyroidism, unspecified: Secondary | ICD-10-CM | POA: Diagnosis present

## 2023-06-19 DIAGNOSIS — Z8249 Family history of ischemic heart disease and other diseases of the circulatory system: Secondary | ICD-10-CM

## 2023-06-19 DIAGNOSIS — Z888 Allergy status to other drugs, medicaments and biological substances status: Secondary | ICD-10-CM

## 2023-06-19 DIAGNOSIS — A09 Infectious gastroenteritis and colitis, unspecified: Secondary | ICD-10-CM | POA: Diagnosis present

## 2023-06-19 DIAGNOSIS — E1165 Type 2 diabetes mellitus with hyperglycemia: Secondary | ICD-10-CM | POA: Diagnosis present

## 2023-06-19 DIAGNOSIS — T8619 Other complication of kidney transplant: Secondary | ICD-10-CM | POA: Diagnosis present

## 2023-06-19 DIAGNOSIS — N3000 Acute cystitis without hematuria: Secondary | ICD-10-CM | POA: Diagnosis not present

## 2023-06-19 DIAGNOSIS — Z794 Long term (current) use of insulin: Secondary | ICD-10-CM

## 2023-06-19 DIAGNOSIS — Z9842 Cataract extraction status, left eye: Secondary | ICD-10-CM

## 2023-06-19 DIAGNOSIS — E785 Hyperlipidemia, unspecified: Secondary | ICD-10-CM | POA: Diagnosis present

## 2023-06-19 DIAGNOSIS — Z90710 Acquired absence of both cervix and uterus: Secondary | ICD-10-CM

## 2023-06-19 DIAGNOSIS — T3695XA Adverse effect of unspecified systemic antibiotic, initial encounter: Secondary | ICD-10-CM | POA: Diagnosis present

## 2023-06-19 DIAGNOSIS — I10 Essential (primary) hypertension: Secondary | ICD-10-CM | POA: Diagnosis present

## 2023-06-19 DIAGNOSIS — Z7982 Long term (current) use of aspirin: Secondary | ICD-10-CM

## 2023-06-19 DIAGNOSIS — Z79890 Hormone replacement therapy (postmenopausal): Secondary | ICD-10-CM

## 2023-06-19 DIAGNOSIS — Z83719 Family history of colon polyps, unspecified: Secondary | ICD-10-CM

## 2023-06-19 DIAGNOSIS — N179 Acute kidney failure, unspecified: Principal | ICD-10-CM | POA: Diagnosis present

## 2023-06-19 DIAGNOSIS — Y830 Surgical operation with transplant of whole organ as the cause of abnormal reaction of the patient, or of later complication, without mention of misadventure at the time of the procedure: Secondary | ICD-10-CM | POA: Diagnosis present

## 2023-06-19 LAB — RESP PANEL BY RT-PCR (RSV, FLU A&B, COVID)  RVPGX2
Influenza A by PCR: NEGATIVE
Influenza B by PCR: NEGATIVE
Resp Syncytial Virus by PCR: NEGATIVE
SARS Coronavirus 2 by RT PCR: NEGATIVE

## 2023-06-19 LAB — APTT: aPTT: 34 s (ref 24–36)

## 2023-06-19 LAB — CBC WITH DIFFERENTIAL/PLATELET
Abs Immature Granulocytes: 0 10*3/uL (ref 0.00–0.07)
Basophils Absolute: 0 10*3/uL (ref 0.0–0.1)
Basophils Relative: 0 %
Eosinophils Absolute: 0 10*3/uL (ref 0.0–0.5)
Eosinophils Relative: 0 %
HCT: 40.2 % (ref 36.0–46.0)
Hemoglobin: 12.3 g/dL (ref 12.0–15.0)
Lymphocytes Relative: 22 %
Lymphs Abs: 1.2 10*3/uL (ref 0.7–4.0)
MCH: 25.6 pg — ABNORMAL LOW (ref 26.0–34.0)
MCHC: 30.6 g/dL (ref 30.0–36.0)
MCV: 83.8 fL (ref 80.0–100.0)
Monocytes Absolute: 1 10*3/uL (ref 0.1–1.0)
Monocytes Relative: 17 %
Neutro Abs: 3.4 10*3/uL (ref 1.7–7.7)
Neutrophils Relative %: 61 %
Platelets: 178 10*3/uL (ref 150–400)
RBC: 4.8 MIL/uL (ref 3.87–5.11)
RDW: 18.1 % — ABNORMAL HIGH (ref 11.5–15.5)
Smear Review: NORMAL
WBC: 5.6 10*3/uL (ref 4.0–10.5)
nRBC: 0 % (ref 0.0–0.2)

## 2023-06-19 LAB — PROTIME-INR
INR: 1.2 (ref 0.8–1.2)
Prothrombin Time: 15.9 s — ABNORMAL HIGH (ref 11.4–15.2)

## 2023-06-19 LAB — URINALYSIS, W/ REFLEX TO CULTURE (INFECTION SUSPECTED)
Bilirubin Urine: NEGATIVE
Glucose, UA: NEGATIVE mg/dL
Ketones, ur: NEGATIVE mg/dL
Nitrite: NEGATIVE
Protein, ur: 100 mg/dL — AB
RBC / HPF: >50 RBC/hpf (ref 0–5)
Specific Gravity, Urine: 1.015 (ref 1.005–1.030)
WBC, UA: >50 WBC/hpf (ref 0–5)
pH: 5 (ref 5.0–8.0)

## 2023-06-19 LAB — HEMOGLOBIN A1C
Hgb A1c MFr Bld: 8 % — ABNORMAL HIGH (ref 4.8–5.6)
Mean Plasma Glucose: 182.9 mg/dL

## 2023-06-19 LAB — COMPREHENSIVE METABOLIC PANEL
ALT: 12 U/L (ref 0–44)
AST: 27 U/L (ref 15–41)
Albumin: 3.9 g/dL (ref 3.5–5.0)
Alkaline Phosphatase: 65 U/L (ref 38–126)
Anion gap: 14 (ref 5–15)
BUN: 37 mg/dL — ABNORMAL HIGH (ref 8–23)
CO2: 19 mmol/L — ABNORMAL LOW (ref 22–32)
Calcium: 9.7 mg/dL (ref 8.9–10.3)
Chloride: 105 mmol/L (ref 98–111)
Creatinine, Ser: 1.57 mg/dL — ABNORMAL HIGH (ref 0.44–1.00)
GFR, Estimated: 35 mL/min — ABNORMAL LOW (ref >60)
Glucose, Bld: 216 mg/dL — ABNORMAL HIGH (ref 70–99)
Potassium: 4.6 mmol/L (ref 3.5–5.1)
Sodium: 138 mmol/L (ref 135–145)
Total Bilirubin: 1.1 mg/dL (ref 0.0–1.2)
Total Protein: 8.3 g/dL — ABNORMAL HIGH (ref 6.5–8.1)

## 2023-06-19 LAB — I-STAT CG4 LACTIC ACID, ED: Lactic Acid, Venous: 1.9 mmol/L (ref 0.5–1.9)

## 2023-06-19 LAB — GLUCOSE, CAPILLARY
Glucose-Capillary: 330 mg/dL — ABNORMAL HIGH (ref 70–99)
Glucose-Capillary: 92 mg/dL (ref 70–99)

## 2023-06-19 MED ORDER — IBUPROFEN 400 MG PO TABS: 600.0000 mg | ORAL_TABLET | Freq: Four times a day (QID) | ORAL | Status: DC | PRN

## 2023-06-19 MED ORDER — INSULIN GLARGINE-YFGN 100 UNIT/ML ~~LOC~~ SOLN
10.0000 [IU] | Freq: Every day | SUBCUTANEOUS | Status: DC
  Administered 2023-06-19 – 2023-06-21 (×3): 10 [IU] via SUBCUTANEOUS
  Filled 2023-06-19 (×3): qty 0.1

## 2023-06-19 MED ORDER — IBUPROFEN 400 MG PO TABS
600.0000 mg | ORAL_TABLET | Freq: Four times a day (QID) | ORAL | Status: DC | PRN
  Administered 2023-06-19: 600 mg via ORAL
  Filled 2023-06-19: qty 1

## 2023-06-19 MED ORDER — AMLODIPINE BESYLATE 5 MG PO TABS
2.5000 mg | ORAL_TABLET | Freq: Two times a day (BID) | ORAL | Status: DC
  Filled 2023-06-19: qty 1

## 2023-06-19 MED ORDER — LEVOTHYROXINE SODIUM 50 MCG PO TABS
62.5000 ug | ORAL_TABLET | ORAL | Status: DC
  Administered 2023-06-19 – 2023-06-21 (×2): 62.5 ug via ORAL
  Filled 2023-06-19 (×3): qty 1

## 2023-06-19 MED ORDER — ACETAMINOPHEN 650 MG RE SUPP
650.0000 mg | Freq: Four times a day (QID) | RECTAL | Status: DC | PRN
  Administered 2023-06-19: 650 mg via RECTAL
  Filled 2023-06-19 (×2): qty 1

## 2023-06-19 MED ORDER — LACTATED RINGERS IV BOLUS (SEPSIS)
1000.0000 mL | Freq: Once | INTRAVENOUS | Status: AC
  Administered 2023-06-19: 1000 mL via INTRAVENOUS

## 2023-06-19 MED ORDER — SIROLIMUS 0.5 MG PO TABS
2.0000 mg | ORAL_TABLET | Freq: Every day | ORAL | Status: DC
  Administered 2023-06-19 – 2023-06-21 (×3): 2 mg via ORAL
  Filled 2023-06-19 (×3): qty 4
  Filled 2023-06-19 (×2): qty 1

## 2023-06-19 MED ORDER — GEMFIBROZIL 600 MG PO TABS
600.0000 mg | ORAL_TABLET | Freq: Two times a day (BID) | ORAL | Status: DC
  Administered 2023-06-19 – 2023-06-21 (×4): 600 mg via ORAL
  Filled 2023-06-19 (×5): qty 1

## 2023-06-19 MED ORDER — SODIUM CHLORIDE 0.9 % IV BOLUS
1000.0000 mL | Freq: Once | INTRAVENOUS | Status: AC
  Administered 2023-06-20: 1000 mL via INTRAVENOUS

## 2023-06-19 MED ORDER — MONTELUKAST SODIUM 10 MG PO TABS
10.0000 mg | ORAL_TABLET | Freq: Every day | ORAL | Status: DC
  Administered 2023-06-19 – 2023-06-21 (×3): 10 mg via ORAL
  Filled 2023-06-19 (×3): qty 1

## 2023-06-19 MED ORDER — CARVEDILOL 12.5 MG PO TABS
12.5000 mg | ORAL_TABLET | Freq: Two times a day (BID) | ORAL | Status: DC
  Administered 2023-06-19: 12.5 mg via ORAL
  Filled 2023-06-19: qty 1

## 2023-06-19 MED ORDER — SODIUM CHLORIDE 0.9 % IV SOLN: 2.0000 g | Freq: Two times a day (BID) | INTRAVENOUS | Status: DC

## 2023-06-19 MED ORDER — ACETAMINOPHEN 325 MG PO TABS
650.0000 mg | ORAL_TABLET | Freq: Four times a day (QID) | ORAL | Status: DC | PRN
  Administered 2023-06-20: 650 mg via ORAL
  Filled 2023-06-19: qty 2

## 2023-06-19 MED ORDER — VANCOMYCIN HCL IN DEXTROSE 1-5 GM/200ML-% IV SOLN
1000.0000 mg | Freq: Once | INTRAVENOUS | Status: DC
  Filled 2023-06-19: qty 200

## 2023-06-19 MED ORDER — INSULIN ASPART 100 UNIT/ML IJ SOLN
0.0000 [IU] | Freq: Three times a day (TID) | INTRAMUSCULAR | Status: DC
  Administered 2023-06-19: 11 [IU] via SUBCUTANEOUS
  Administered 2023-06-20: 2 [IU] via SUBCUTANEOUS
  Filled 2023-06-19: qty 0.15

## 2023-06-19 MED ORDER — ALBUTEROL SULFATE (2.5 MG/3ML) 0.083% IN NEBU: 2.5000 mg | INHALATION_SOLUTION | RESPIRATORY_TRACT | Status: DC | PRN

## 2023-06-19 MED ORDER — TRAZODONE HCL 50 MG PO TABS: 25.0000 mg | ORAL_TABLET | Freq: Every evening | ORAL | Status: DC | PRN

## 2023-06-19 MED ORDER — SODIUM CHLORIDE 0.9 % IV SOLN
2.0000 g | Freq: Once | INTRAVENOUS | Status: AC
  Administered 2023-06-19: 2 g via INTRAVENOUS
  Filled 2023-06-19: qty 12.5

## 2023-06-19 MED ORDER — METRONIDAZOLE 500 MG/100ML IV SOLN
500.0000 mg | Freq: Once | INTRAVENOUS | Status: DC
  Filled 2023-06-19: qty 100

## 2023-06-19 MED ORDER — ACETAMINOPHEN 325 MG PO TABS
650.0000 mg | ORAL_TABLET | Freq: Once | ORAL | Status: AC
  Administered 2023-06-19: 650 mg via ORAL
  Filled 2023-06-19: qty 2

## 2023-06-19 MED ORDER — LACTATED RINGERS IV BOLUS
1000.0000 mL | Freq: Once | INTRAVENOUS | Status: AC
  Administered 2023-06-19: 1000 mL via INTRAVENOUS

## 2023-06-19 MED ORDER — INSULIN ASPART 100 UNIT/ML IJ SOLN
0.0000 [IU] | Freq: Every day | INTRAMUSCULAR | Status: DC
  Filled 2023-06-19: qty 0.05

## 2023-06-19 MED ORDER — SODIUM CHLORIDE 0.9 % IV SOLN: INTRAVENOUS | Status: AC

## 2023-06-19 MED ORDER — ONDANSETRON HCL 4 MG/2ML IJ SOLN
4.0000 mg | Freq: Four times a day (QID) | INTRAMUSCULAR | Status: DC | PRN
  Administered 2023-06-19 – 2023-06-20 (×2): 4 mg via INTRAVENOUS
  Filled 2023-06-19 (×2): qty 2

## 2023-06-19 MED ORDER — SODIUM CHLORIDE 0.9 % IV SOLN
1.0000 g | INTRAVENOUS | Status: DC
  Administered 2023-06-19 – 2023-06-20 (×2): 1 g via INTRAVENOUS
  Filled 2023-06-19 (×2): qty 10

## 2023-06-19 MED ORDER — ASPIRIN 81 MG PO TBEC
81.0000 mg | DELAYED_RELEASE_TABLET | Freq: Every day | ORAL | Status: DC
  Administered 2023-06-19 – 2023-06-21 (×3): 81 mg via ORAL
  Filled 2023-06-19 (×3): qty 1

## 2023-06-19 MED ORDER — ONDANSETRON HCL 4 MG PO TABS: 4.0000 mg | ORAL_TABLET | Freq: Four times a day (QID) | ORAL | Status: DC | PRN

## 2023-06-19 MED ORDER — ENOXAPARIN SODIUM 40 MG/0.4ML IJ SOSY
40.0000 mg | PREFILLED_SYRINGE | INTRAMUSCULAR | Status: DC
  Administered 2023-06-19: 40 mg via SUBCUTANEOUS
  Filled 2023-06-19: qty 0.4

## 2023-06-19 MED ORDER — EZETIMIBE 10 MG PO TABS
10.0000 mg | ORAL_TABLET | Freq: Every day | ORAL | Status: DC
  Administered 2023-06-19 – 2023-06-21 (×3): 10 mg via ORAL
  Filled 2023-06-19 (×3): qty 1

## 2023-06-19 NOTE — H&P (Signed)
History and Physical  Denise Bush:[id number] DOB: [birth year] DOA: 06/19/2023  PCP: Estevan Oaks, NP   Chief Complaint: Dysuria, weakness  HPI: Denise Bush is a 73 y.o. female with medical history significant for diabetic retinopathy, hypertension, hypothyroidism, hyperlipidemia, ESRD status post deceased donor renal transplant, insulin-dependent type 2 diabetes being admitted to the hospital with UTI.  History is provided by the patient and her husband who is at the bedside.  She was in her usual state of health until 2 to 3 days ago when she started having dysuria, associated nausea, some diarrhea.  Thinks that she may have had some subjective fevers at home.  Denies any pelvic or abdominal pain, urgency frequency or incontinence with urination.  Workup in the ED as noted below consistent with UTI.  Given empiric IV cefepime.  Note history of E. coli bacteremia in the setting of UTI last year.  Review of Systems: Please see HPI for pertinent positives and negatives. A complete 10 system review of systems are otherwise negative.  Past Medical History:  Diagnosis Date   Allergy    Arthritis    Blood transfusion without reported diagnosis    Cataract    bilateral   Diabetic retinopathy (HCC)    End stage renal disease on dialysis due to type 2 diabetes mellitus (HCC) 03/22/2015   ESRD (end stage renal disease) (HCC)    Hashimoto's disease    Hyperlipidemia    Hyperparathyroidism, secondary renal (HCC) 06/25/2015   Hypertension    Hypothyroid    Loss of vision    both eyes, no vision left eye and little in left    Past Surgical History:  Procedure Laterality Date   ABDOMINAL HYSTERECTOMY     BREAST BIOPSY Right 2015   benign   COLONOSCOPY     EYE SURGERY     KNEE SURGERY     TOTAL KNEE ARTHROPLASTY Right 01/24/2022   Procedure: RIGHT TOTAL KNEE ARTHROPLASTY;  Surgeon: Marcene Corning, MD;  Location: [facility];  Service: Orthopedics;  Laterality: Right;   Social  History:  reports that she has never smoked. She has never used smokeless tobacco. She reports that she does not drink alcohol and does not use drugs.  Allergies  Allergen Reactions   Hydralazine Shortness Of Breath    Chest pain Fatigue    Azor [Amlodipine-Olmesartan] Other (See Comments)    Sharp chest pain     Benadryl [Diphenhydramine] Other (See Comments)    Unknown reaction   Camellia Other (See Comments)    Unknown reaction   Denaverine Other (See Comments)    Unknown reaction   Dyazide [Hydrochlorothiazide W-Triamterene] Swelling and Other (See Comments)    Chest pain Throat swelling   Glucophage [Metformin] Diarrhea, Itching and Nausea Only    Bowel incontinence    Hydrochlorothiazide Other (See Comments)    "contractions in throat"    Laureth Other (See Comments)    Unknown reaction   Prednisone Other (See Comments)    Elevated Glucose    Reglan [Metoclopramide] Other (See Comments)    Worsening acid reflux   Trandate [Labetalol] Other (See Comments)    Foot pain   Zestril [Lisinopril] Other (See Comments)    Severe radiating foot pain.   Aldactone [Spironolactone] Rash and Other (See Comments)    Severe radiating pain in feet    Rogaine [Minoxidil] Rash and Other (See Comments)    Fatigue  Choking, throat contractions   Statins Rash    Fatigue  Family History  Problem Relation Age of Onset   Breast cancer Sister    CAD Other        No family history   Colon polyps Maternal Aunt    Breast cancer Maternal Aunt    Colon polyps Maternal Uncle    Breast cancer Maternal Grandmother    Breast cancer Maternal Aunt    Colon cancer Neg Hx      Prior to Admission medications   Medication Sig Start Date End Date Taking? Authorizing Provider  acetaminophen (TYLENOL) 500 MG tablet Take 500 mg by mouth daily as needed for mild pain (pain score 1-3).   Yes [provider]  aspirin EC 81 MG tablet Take 1 tablet (81 mg total) by mouth daily. 07/06/22  Yes  Rocky Morel, DO  BELATACEPT IV Inject 1 Dose into the vein every 28 (twenty-eight) days. 07/16/18  Yes [provider]  ezetimibe (ZETIA) 10 MG tablet Take 1 tablet (10 mg total) by mouth daily. 02/21/17  Yes Judd Gaudier, NP  gemfibrozil (LOPID) 600 MG tablet TAKE 1 TABLET TWICE A DAY WITH MEALS FOR CHOLESTEROL AND TRIGLYCERIDES Patient taking differently: Take 600 mg by mouth 2 (two) times daily. 02/16/22  Yes Raynelle Dick, NP  insulin lispro (HUMALOG) 100 UNIT/ML KwikPen Inject 3-5 Units into the skin 2 (two) times daily as needed. Inject 3-5 units up to twice daily with meals as needed for hyperglycemia. Dose dependant on fasting blood sugar and meal content. 03/30/20  Yes [provider]  ketoconazole (NIZORAL) 2 % cream Apply 1 Application topically daily as needed for irritation (redness, scaling).   Yes [provider]  LANTUS SOLOSTAR 100 UNIT/ML Solostar Pen Inject 21 Units into the skin daily.   Yes [provider]  levothyroxine (SYNTHROID) 125 MCG tablet Take 62.5 mcg by mouth every other day.   Yes [provider]  linagliptin (TRADJENTA) 5 MG TABS tablet Take 5 mg by mouth daily. 10/14/18  Yes [provider]  montelukast (SINGULAIR) 10 MG tablet Take 10 mg by mouth daily.   Yes [provider]  sirolimus (RAPAMUNE) 1 MG tablet Take    1 tablet     Daily     for Kidney Transplant Patient taking differently: Take 2 mg by mouth daily at 12 noon. 02/10/20  Yes Lucky Cowboy, MD  amLODipine (NORVASC) 2.5 MG tablet Take one tablet twice a day Patient taking differently: Take 2.5 mg by mouth 2 (two) times daily. 04/29/20   Judd Gaudier, NP  carvedilol (COREG) 12.5 MG tablet Take 1 tablet (12.5 mg total) by mouth 2 (two) times daily with a meal. 07/06/22   Rocky Morel, DO    Physical Exam: BP (!) 142/66   Pulse 98   Temp (!) 100.5 F (38.1 C) (Oral)   Resp (!) 22   Ht 5\' 5"  (1.651 m)   Wt 69.9 kg   LMP   (LMP Unknown)   SpO2 98%   BMI 25.64 kg/m  General:  Alert, oriented, calm, in no acute distress, legally blind.  Husband at the bedside. Cardiovascular: RRR, no murmurs or rubs, no peripheral edema  Respiratory: clear to auscultation bilaterally, no wheezes, no crackles  Abdomen: soft, nontender, nondistended, normal bowel tones heard  Skin: dry, no rashes  Musculoskeletal: no joint effusions, normal range of motion  Psychiatric: appropriate affect, normal speech  Neurologic: extraocular muscles intact, clear speech, moving all extremities with intact sensorium  Labs on Admission:  Basic Metabolic Panel: Recent Labs  Lab 06/19/23 1240  NA 138  K 4.6  CL 105  CO2 19*  GLUCOSE 216*  BUN 37*  CREATININE 1.57*  CALCIUM 9.7   Liver Function Tests: Recent Labs  Lab 06/19/23 1240  AST 27  ALT 12  ALKPHOS 65  BILITOT 1.1  PROT 8.3*  ALBUMIN 3.9   No results for input(s): "LIPASE", "AMYLASE" in the last 168 hours. No results for input(s): "AMMONIA" in the last 168 hours. CBC: Recent Labs  Lab 06/19/23 1240  WBC 5.6  NEUTROABS 3.4  HGB 12.3  HCT 40.2  MCV 83.8  PLT 178   Cardiac Enzymes: No results for input(s): "CKTOTAL", "CKMB", "CKMBINDEX", "TROPONINI" in the last 168 hours. BNP (last 3 results) No results for input(s): "BNP" in the last 8760 hours.  ProBNP (last 3 results) No results for input(s): "PROBNP" in the last 8760 hours.  CBG: No results for input(s): "GLUCAP" in the last 168 hours.  Radiological Exams on Admission: No results found.  Assessment/Plan Denise Bush is a 73 y.o. female with medical history significant for diabetic retinopathy, hypertension, hypothyroidism, hyperlipidemia, ESRD status post deceased donor renal transplant, insulin-dependent type 2 diabetes being admitted to the hospital with UTI.   UTI-no evidence of sepsis, hemodynamically stable.  Presents with dysuria, associated nausea, subjective fevers and abnormal  urinalysis.  History of E. Coli UTI with bacteremia in 2024, cultures reviewed. -Observation admission -Follow-up blood and urine cultures -Empiric IV Rocephin  AKI in the setting of normal renal function, status post deceased donor transplant.  Baseline renal function appears to be normal. -Avoid nephrotoxins, hydrate gently with 1 more liter of normal saline overnight -Monitor renal function with morning labs -Continue home dose of Rapamune, due for next belatacept infusion 06/25/2023.  IDDM-continue home Lantus at reduced dose (due to reduced oral intake), and moderate dose sliding scale  Hypertension-continue amlodipine 2.5 mg p.o. twice daily, Coreg 12.5 mg p.o. twice daily  Lipidemia-Zetia, Lopid  Hypothyroidism-Synthroid  DVT prophylaxis: Lovenox     Code Status: Do not attempt resuscitation (DNR) PRE-ARREST INTERVENTIONS DESIRED  Consults called: None  Admission status: Observation  Time spent: 55 minutes  Joshue Badal Sharlette Dense MD [facility] Pager [phone]  If 7PM-7AM, please contact night-coverage [email or web address] Password [facility]  06/19/2023, 2:48 PM

## 2023-06-19 NOTE — Progress Notes (Signed)
The patient's BP is 88/41, HR 74, T 99.4, resp 16, and 99% room air. The patient's husband at bedside said that the patient has been sleeping too deeply and difficult to arouse today. I also witness that the patient is sleeping very deeply. She will wake up when loudly repeating her name, then fall back asleep very quickly. She did have urine output between 4 pm and 7 pm that has not been charted. Previously a MEWS 2 today. Messaged Chinita Greenland.

## 2023-06-19 NOTE — Progress Notes (Signed)
Unmeasured urine found in the bathroom by the patient's husband at 19:00.

## 2023-06-19 NOTE — ED Notes (Signed)
Able to obtain [facility]. Unable to obtain second access at this time

## 2023-06-19 NOTE — ED Triage Notes (Signed)
Pt arrived with EMS for ams. Husband called stating she acts this when when she has a UTI. Patient blind. Aaox2 to name and place. Denies any other symptoms. Pt febrile on arrival

## 2023-06-19 NOTE — Progress Notes (Signed)
The patient arrived to the unit. She alert, orient to self and place. Pt complained of nausea and had an emesis episode x1. Yellow MEWS on arrival. Hospitalist updated.  06/19/23 1611  Vitals  Temp (!) 103 F (39.4 C)  Temp Source Oral  BP (!) 186/66  MAP (mmHg) 93  BP Location Right Arm  BP Method Automatic  Patient Position (if appropriate) Lying  Pulse Rate (!) 106  Resp 20  MEWS COLOR  MEWS Score Color Yellow  Oxygen Therapy  SpO2 95 %  O2 Device Room Air  MEWS Score  MEWS Temp 2  MEWS Systolic 0  MEWS Pulse 1  MEWS RR 0  MEWS LOC 0  MEWS Score 3

## 2023-06-19 NOTE — ED Provider Notes (Signed)
[place] [facility] DE[facility]MENT AT [facility] Provider Note   CSN: [id number] Arrival date & time: 06/19/23  1049     History  Chief Complaint  Patient presents with   Altered Mental Status    Denise Bush is a 73 y.o. female.  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 coli bacteremia, hypertension, hyperlipidemia, loss of vision due to diabetic retinopathy (legally blind) who presents with concern for generalized weakness, fever, nausea, vomiting, urinary symptoms.  Reports symptoms began yesterday, generalized weakness, chills. Believes she had fever but had not checked it.  Had burning with urination.  Husband thought she seemed disoriented today which was similar to when had urinary tract infection in the past.  No cough, congestion, rash. Has had some nausea, loose stool. No abdominal pain or flank pain. Mild headache, no neck stiffness.  Past Medical History:  Diagnosis Date   Allergy    Arthritis    Blood transfusion without reported diagnosis    Cataract    bilateral   Diabetic retinopathy (HCC)    End stage renal disease on dialysis due to type 2 diabetes mellitus (HCC) 03/22/2015   ESRD (end stage renal disease) (HCC)    Hashimoto's disease    Hyperlipidemia    Hyperparathyroidism, secondary renal (HCC) 06/25/2015   Hypertension    Hypothyroid    Loss of vision    both eyes, no vision left eye and little in left      Home Medications Prior to Admission medications   Medication Sig Start Date End Date Taking? Authorizing Provider  acetaminophen (TYLENOL) 500 MG tablet Take 500 mg by mouth daily as needed for mild pain (pain score 1-3).   Yes [provider]  aspirin EC 81 MG tablet Take 1 tablet (81 mg total) by mouth daily. 07/06/22  Yes Rocky Morel, DO  BELATACEPT IV Inject 1 Dose into the  vein every 28 (twenty-eight) days. 07/16/18  Yes [provider]  ezetimibe (ZETIA) 10 MG tablet Take 1 tablet (10 mg total) by mouth daily. 02/21/17  Yes Judd Gaudier, NP  gemfibrozil (LOPID) 600 MG tablet TAKE 1 TABLET TWICE A DAY WITH MEALS FOR CHOLESTEROL AND TRIGLYCERIDES Patient taking differently: Take 600 mg by mouth 2 (two) times daily. 02/16/22  Yes Raynelle Dick, NP  insulin lispro (HUMALOG) 100 UNIT/ML KwikPen Inject 3-5 Units into the skin 2 (two) times daily as needed. Inject 3-5 units up to twice daily with meals as needed for hyperglycemia. Dose dependant on fasting blood sugar and meal content. 03/30/20  Yes [provider]  ketoconazole (NIZORAL) 2 % cream Apply 1 Application topically daily as needed for irritation (redness, scaling).   Yes [provider]  LANTUS SOLOSTAR 100 UNIT/ML Solostar Pen Inject 21 Units into the skin daily.   Yes [provider]  levothyroxine (SYNTHROID) 125 MCG tablet Take 62.5 mcg by mouth every other day.   Yes [provider]  linagliptin (TRADJENTA) 5 MG TABS tablet Take 5 mg by mouth daily. 10/14/18  Yes [provider]  montelukast (SINGULAIR) 10 MG tablet Take 10 mg by mouth daily.   Yes [provider]  sirolimus (RAPAMUNE) 1 MG tablet Take    1 tablet     Daily     for Kidney Transplant Patient taking differently: Take 2 mg by mouth  daily at 12 noon. 02/10/20  Yes Lucky Cowboy, MD  amLODipine (NORVASC) 2.5 MG tablet Take one tablet twice a day Patient taking differently: Take 2.5 mg by mouth 2 (two) times daily. 04/29/20   Judd Gaudier, NP  carvedilol (COREG) 12.5 MG tablet Take 1 tablet (12.5 mg total) by mouth 2 (two) times daily with a meal. 07/06/22   Rocky Morel, DO      Allergies    Hydralazine, Azor [amlodipine-olmesartan], Benadryl [diphenhydramine], Camellia, Denaverine, Dyazide [hydrochlorothiazide w-triamterene], Glucophage [metformin],  Hydrochlorothiazide, Laureth, Prednisone, Reglan [metoclopramide], Trandate [labetalol], Zestril [lisinopril], Aldactone [spironolactone], Rogaine [minoxidil], and Statins    Review of Systems   Review of Systems  Physical Exam Updated Vital Signs BP (!) 99/51 (BP Location: Right Arm)   Pulse 79   Temp 99.9 F (37.7 C) (Oral)   Resp 16   Ht 5\' 5"  (1.651 m)   Wt 69.9 kg   LMP  (LMP Unknown)   SpO2 91%   BMI 25.64 kg/m  Physical Exam Vitals and nursing note reviewed.  Constitutional:      General: She is not in acute distress.    Appearance: She is well-developed. She is not diaphoretic.  HENT:     Head: Normocephalic and atraumatic.  Eyes:     Conjunctiva/sclera: Conjunctivae normal.  Cardiovascular:     Rate and Rhythm: Normal rate and regular rhythm.     Heart sounds: Normal heart sounds. No murmur heard.    No friction rub. No gallop.  Pulmonary:     Effort: Pulmonary effort is normal. No respiratory distress.     Breath sounds: Normal breath sounds. No wheezing or rales.  Abdominal:     General: There is no distension.     Palpations: Abdomen is soft.     Tenderness: There is no abdominal tenderness. There is no guarding.  Musculoskeletal:        General: No tenderness.     Cervical back: Normal range of motion.  Skin:    General: Skin is warm and dry.     Findings: No erythema or rash.  Neurological:     Mental Status: She is alert and oriented to person, place, and time.     ED Results / Procedures / Treatments   Labs (all labs ordered are listed, but only abnormal results are displayed) Labs Reviewed  COMPREHENSIVE METABOLIC PANEL - Abnormal; Notable for the following components:      Result Value   CO2 19 (*)    Glucose, Bld 216 (*)    BUN 37 (*)    Creatinine, Ser 1.57 (*)    Total Protein 8.3 (*)    GFR, Estimated 35 (*)    All other components within normal limits  CBC WITH DIFFERENTIAL/PLATELET - Abnormal; Notable for the following components:    MCH 25.6 (*)    RDW 18.1 (*)    All other components within normal limits  PROTIME-INR - Abnormal; Notable for the following components:   Prothrombin Time 15.9 (*)    All other components within normal limits  URINALYSIS, W/ REFLEX TO CULTURE (INFECTION SUSPECTED) - Abnormal; Notable for the following components:   APPearance TURBID (*)    Hgb urine dipstick LARGE (*)    Protein, ur 100 (*)    Leukocytes,Ua LARGE (*)    Bacteria, UA MANY (*)    All other components within normal limits  HEMOGLOBIN A1C - Abnormal; Notable for the following components:   Hgb A1c MFr Bld 8.0 (*)  All other components within normal limits  GLUCOSE, CAPILLARY - Abnormal; Notable for the following components:   Glucose-Capillary 330 (*)    All other components within normal limits  RESP PANEL BY RT-PCR (RSV, FLU A&B, COVID)  RVPGX2  CULTURE, BLOOD (ROUTINE X 2)  URINE CULTURE  CULTURE, BLOOD (ROUTINE X 2)  APTT  GLUCOSE, CAPILLARY  CBC  BASIC METABOLIC PANEL  I-STAT CG4 LACTIC ACID, ED  I-STAT CG4 LACTIC ACID, ED    EKG None  Radiology DG Chest Port 1 View Result Date: 06/19/2023 CLINICAL DATA:  Fever altered mental status EXAM: PORTABLE CHEST 1 VIEW COMPARISON:  07/04/2022 FINDINGS: Low lung volumes. Rotated patient. Stable cardiomediastinal silhouette. No consolidation, pleural effusion or pneumothorax. IMPRESSION: Low lung volumes. No active disease allowing for patient positioning Electronically Signed   By: Jasmine Pang M.D.   On: 06/19/2023 15:37    Procedures Procedures    Medications Ordered in ED Medications  insulin aspart (novoLOG) injection 0-15 Units (11 Units Subcutaneous Given 06/19/23 1630)  insulin aspart (novoLOG) injection 0-5 Units (has no administration in time range)  0.9 %  sodium chloride infusion ( Intravenous New Bag/Given 06/19/23 1800)  aspirin EC tablet 81 mg (81 mg Oral Given 06/19/23 1700)  amLODipine (NORVASC) tablet 2.5 mg (has no administration in time  range)  carvedilol (COREG) tablet 12.5 mg (12.5 mg Oral Given 06/19/23 1824)  ezetimibe (ZETIA) tablet 10 mg (10 mg Oral Given 06/19/23 1700)  gemfibrozil (LOPID) tablet 600 mg (600 mg Oral Given 06/19/23 1843)  levothyroxine (SYNTHROID) tablet 62.5 mcg (62.5 mcg Oral Given 06/19/23 2115)  insulin glargine-yfgn (SEMGLEE) injection 10 Units (10 Units Subcutaneous Given 06/19/23 1844)  Sirolimus (RAPAMUNE) tablet 2 mg (2 mg Oral Given 06/19/23 1844)  montelukast (SINGULAIR) tablet 10 mg (10 mg Oral Given 06/19/23 1700)  enoxaparin (LOVENOX) injection 40 mg (40 mg Subcutaneous Given 06/19/23 1824)  acetaminophen (TYLENOL) tablet 650 mg ( Oral See Alternative 06/19/23 1626)    Or  acetaminophen (TYLENOL) suppository 650 mg (650 mg Rectal Given 06/19/23 1626)  traZODone (DESYREL) tablet 25 mg (has no administration in time range)  ondansetron (ZOFRAN) tablet 4 mg ( Oral See Alternative 06/19/23 1626)    Or  ondansetron (ZOFRAN) injection 4 mg (4 mg Intravenous Given 06/19/23 1626)  albuterol (PROVENTIL) (2.5 MG/3ML) 0.083% nebulizer solution 2.5 mg (has no administration in time range)  cefTRIAXone (ROCEPHIN) 1 g in sodium chloride 0.9 % 100 mL IVPB (has no administration in time range)  ibuprofen (ADVIL) tablet 600 mg (600 mg Oral Given 06/19/23 1925)  acetaminophen (TYLENOL) tablet 650 mg (650 mg Oral Given 06/19/23 1213)  lactated ringers bolus 1,000 mL (0 mLs Intravenous Stopped 06/19/23 1314)  ceFEPIme (MAXIPIME) 2 g in sodium chloride 0.9 % 100 mL IVPB (0 g Intravenous Stopped 06/19/23 1235)  lactated ringers bolus 1,000 mL (0 mLs Intravenous Stopped 06/19/23 1546)    ED Course/ Medical Decision Making/ A&P                                    73 year old female with a history of deceased donor kidney transplantation September 2019 , 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 coli bacteremia, DM, hypertension, hyperlipidemia, loss of vision due to diabetic  retinopathy (legally blind) who presents with concern for generalized weakness, fever, nausea, vomiting, urinary symptoms.  Blood cx and empiric cefepime ordered (initially  vanc/flagyl but these were canceled as UA returned consistent with UTI and symptoms consistent with UTI).  No abdominal or flank pain and doubt acute intraabdominal infection. Low clinical suspicion for meningitis.   Labs obtained showing AKI, mild hyperglycemia, negative covid/flu/rsv. CXR without pneumonia.  UA consistent with UTI.  Will admit for continued care of UTI, AKI, history of renal transplant.         Final Clinical Impression(s) / ED Diagnoses Final diagnoses:  AKI (acute kidney injury) (HCC)  Urinary tract infection without hematuria, site unspecified    Rx / DC Orders ED Discharge Orders     None         Alvira Monday, MD 06/19/23 2254

## 2023-06-19 NOTE — Progress Notes (Signed)
Pharmacy Antibiotic Note  Denise Bush is a 73 y.o. female admitted on 06/19/2023 with UTI.  Pharmacy has been consulted for Cefepime dosing.  Plan: Cefepime 2g IV q12h Monitor renal function, cultures, clinical course  Height: 5\' 5"  (165.1 cm) Weight: 69.9 kg (154 lb 1.6 oz) IBW/kg (Calculated) : 57  Temp (24hrs), Avg:102.5 F (39.2 C), Min:102 F (38.9 C), Max:102.9 F (39.4 C)  Recent Labs  Lab 06/19/23 1240 06/19/23 1249  WBC 5.6  --   CREATININE 1.57*  --   LATICACIDVEN  --  1.9    Estimated Creatinine Clearance: 32.3 mL/min (A) (by C-G formula based on SCr of 1.57 mg/dL (H)).    Allergies  Allergen Reactions   Hydralazine Shortness Of Breath    Chest pain Fatigue    Azor [Amlodipine-Olmesartan] Other (See Comments)    Sharp chest pain     Benadryl [Diphenhydramine] Other (See Comments)    Unknown reaction   Camellia Other (See Comments)    Unknown reaction   Denaverine Other (See Comments)    Unknown reaction   Dyazide [Hydrochlorothiazide W-Triamterene] Swelling and Other (See Comments)    Chest pain Throat swelling   Glucophage [Metformin] Diarrhea, Itching and Nausea Only    Bowel incontinence    Hydrochlorothiazide Other (See Comments)    "contractions in throat"    Laureth Other (See Comments)    Unknown reaction   Prednisone Other (See Comments)    Elevated Glucose    Reglan [Metoclopramide] Other (See Comments)    Worsening acid reflux   Trandate [Labetalol] Other (See Comments)    Foot pain   Zestril [Lisinopril] Other (See Comments)    Severe radiating foot pain.   Aldactone [Spironolactone] Rash and Other (See Comments)    Severe radiating pain in feet    Rogaine [Minoxidil] Rash and Other (See Comments)    Fatigue  Choking, throat contractions   Statins Rash    Fatigue    Antimicrobials this admission: 2/18 Cefepime >>  Dose adjustments this admission: --  Microbiology results: 2/18 BCx:  2/18 UCx:   2/18 Resp panel:  COVID, Influenza, RSV negative   Thank you for allowing pharmacy to be a part of this patient's care.   Greer Pickerel, PharmD, BCPS Clinical Pharmacist 06/19/2023 2:14 PM

## 2023-06-20 DIAGNOSIS — T8619 Other complication of kidney transplant: Secondary | ICD-10-CM | POA: Diagnosis present

## 2023-06-20 DIAGNOSIS — N3000 Acute cystitis without hematuria: Secondary | ICD-10-CM | POA: Diagnosis not present

## 2023-06-20 DIAGNOSIS — I10 Essential (primary) hypertension: Secondary | ICD-10-CM | POA: Diagnosis present

## 2023-06-20 DIAGNOSIS — A09 Infectious gastroenteritis and colitis, unspecified: Secondary | ICD-10-CM | POA: Diagnosis present

## 2023-06-20 DIAGNOSIS — E039 Hypothyroidism, unspecified: Secondary | ICD-10-CM | POA: Diagnosis present

## 2023-06-20 DIAGNOSIS — Z7982 Long term (current) use of aspirin: Secondary | ICD-10-CM | POA: Diagnosis not present

## 2023-06-20 DIAGNOSIS — N179 Acute kidney failure, unspecified: Secondary | ICD-10-CM

## 2023-06-20 DIAGNOSIS — H548 Legal blindness, as defined in USA: Secondary | ICD-10-CM | POA: Diagnosis present

## 2023-06-20 DIAGNOSIS — Z940 Kidney transplant status: Secondary | ICD-10-CM | POA: Diagnosis not present

## 2023-06-20 DIAGNOSIS — Z96651 Presence of right artificial knee joint: Secondary | ICD-10-CM | POA: Diagnosis present

## 2023-06-20 DIAGNOSIS — E11319 Type 2 diabetes mellitus with unspecified diabetic retinopathy without macular edema: Secondary | ICD-10-CM | POA: Diagnosis present

## 2023-06-20 DIAGNOSIS — Z83719 Family history of colon polyps, unspecified: Secondary | ICD-10-CM | POA: Diagnosis not present

## 2023-06-20 DIAGNOSIS — Z8249 Family history of ischemic heart disease and other diseases of the circulatory system: Secondary | ICD-10-CM | POA: Diagnosis not present

## 2023-06-20 DIAGNOSIS — E1165 Type 2 diabetes mellitus with hyperglycemia: Secondary | ICD-10-CM | POA: Diagnosis present

## 2023-06-20 DIAGNOSIS — Z66 Do not resuscitate: Secondary | ICD-10-CM | POA: Diagnosis present

## 2023-06-20 DIAGNOSIS — Z7984 Long term (current) use of oral hypoglycemic drugs: Secondary | ICD-10-CM | POA: Diagnosis not present

## 2023-06-20 DIAGNOSIS — Z79899 Other long term (current) drug therapy: Secondary | ICD-10-CM | POA: Diagnosis not present

## 2023-06-20 DIAGNOSIS — Z79890 Hormone replacement therapy (postmenopausal): Secondary | ICD-10-CM | POA: Diagnosis not present

## 2023-06-20 DIAGNOSIS — Z90710 Acquired absence of both cervix and uterus: Secondary | ICD-10-CM | POA: Diagnosis not present

## 2023-06-20 DIAGNOSIS — N390 Urinary tract infection, site not specified: Secondary | ICD-10-CM | POA: Diagnosis present

## 2023-06-20 DIAGNOSIS — Z794 Long term (current) use of insulin: Secondary | ICD-10-CM | POA: Diagnosis not present

## 2023-06-20 DIAGNOSIS — Y830 Surgical operation with transplant of whole organ as the cause of abnormal reaction of the patient, or of later complication, without mention of misadventure at the time of the procedure: Secondary | ICD-10-CM | POA: Diagnosis present

## 2023-06-20 DIAGNOSIS — Z803 Family history of malignant neoplasm of breast: Secondary | ICD-10-CM | POA: Diagnosis not present

## 2023-06-20 DIAGNOSIS — E1122 Type 2 diabetes mellitus with diabetic chronic kidney disease: Secondary | ICD-10-CM | POA: Diagnosis present

## 2023-06-20 DIAGNOSIS — Z87440 Personal history of urinary (tract) infections: Secondary | ICD-10-CM | POA: Diagnosis not present

## 2023-06-20 DIAGNOSIS — E785 Hyperlipidemia, unspecified: Secondary | ICD-10-CM | POA: Diagnosis present

## 2023-06-20 DIAGNOSIS — Z888 Allergy status to other drugs, medicaments and biological substances status: Secondary | ICD-10-CM | POA: Diagnosis not present

## 2023-06-20 LAB — BASIC METABOLIC PANEL
Anion gap: 9 (ref 5–15)
BUN: 43 mg/dL — ABNORMAL HIGH (ref 8–23)
CO2: 16 mmol/L — ABNORMAL LOW (ref 22–32)
Calcium: 8.4 mg/dL — ABNORMAL LOW (ref 8.9–10.3)
Chloride: 115 mmol/L — ABNORMAL HIGH (ref 98–111)
Creatinine, Ser: 2.03 mg/dL — ABNORMAL HIGH (ref 0.44–1.00)
GFR, Estimated: 26 mL/min — ABNORMAL LOW (ref >60)
Glucose, Bld: 103 mg/dL — ABNORMAL HIGH (ref 70–99)
Potassium: 4.4 mmol/L (ref 3.5–5.1)
Sodium: 140 mmol/L (ref 135–145)

## 2023-06-20 LAB — GLUCOSE, CAPILLARY
Glucose-Capillary: 103 mg/dL — ABNORMAL HIGH (ref 70–99)
Glucose-Capillary: 103 mg/dL — ABNORMAL HIGH (ref 70–99)
Glucose-Capillary: 119 mg/dL — ABNORMAL HIGH (ref 70–99)
Glucose-Capillary: 141 mg/dL — ABNORMAL HIGH (ref 70–99)
Glucose-Capillary: 96 mg/dL (ref 70–99)
Glucose-Capillary: 98 mg/dL (ref 70–99)

## 2023-06-20 LAB — CBC
HCT: 41.4 % (ref 36.0–46.0)
Hemoglobin: 12.9 g/dL (ref 12.0–15.0)
MCH: 25.8 pg — ABNORMAL LOW (ref 26.0–34.0)
MCHC: 31.2 g/dL (ref 30.0–36.0)
MCV: 82.8 fL (ref 80.0–100.0)
Platelets: 102 10*3/uL — ABNORMAL LOW (ref 150–400)
RBC: 5 MIL/uL (ref 3.87–5.11)
RDW: 18.6 % — ABNORMAL HIGH (ref 11.5–15.5)
WBC: 5.2 10*3/uL (ref 4.0–10.5)
nRBC: 0 % (ref 0.0–0.2)

## 2023-06-20 MED ORDER — SODIUM CHLORIDE 0.9 % IV SOLN: INTRAVENOUS | Status: AC

## 2023-06-20 MED ORDER — ENOXAPARIN SODIUM 30 MG/0.3ML IJ SOSY
30.0000 mg | PREFILLED_SYRINGE | INTRAMUSCULAR | Status: DC
  Administered 2023-06-20: 30 mg via SUBCUTANEOUS
  Filled 2023-06-20: qty 0.3

## 2023-06-20 MED ORDER — ENSURE ENLIVE PO LIQD
237.0000 mL | Freq: Two times a day (BID) | ORAL | Status: DC
  Administered 2023-06-21: 237 mL via ORAL

## 2023-06-20 NOTE — Progress Notes (Signed)
PROGRESS NOTE    Denise Bush  ZOX:[id number] DOB: 1950/10/05 DOA: 06/19/2023 PCP: Estevan Oaks, NP   Brief Narrative:  Denise Bush is a 73 y.o. female with medical history significant for diabetic retinopathy, hypertension, hypothyroidism, hyperlipidemia, ESRD status post deceased donor renal transplant, insulin-dependent type 2 diabetes being admitted to the hospital with UTI.   Assessment & Plan:   Principal Problem:   UTI (urinary tract infection)   UTI, POA-no evidence of sepsis, hemodynamically stable.   -Prior history of E. Coli UTI with bacteremia in 2024, cultures reviewed. -Continue ceftriaxone, cultures pending for speciation and sensitivities   AKI without history of CKD, status post deceased donor transplant  - Ongoing, likely prerenal, continue to advance diet, IV fluids ongoing - Continue home dose of Rapamune, due for next belatacept infusion 06/25/2023.   IDDM-continue home Lantus at reduced dose (diminished/poor p.o. intake), and moderate dose sliding scale -resume home medications at discharge   Hypertension-continue amlodipine 2.5 mg p.o. twice daily, Coreg 12.5 mg p.o. twice daily Hyperlipidemia-continue Zetia, Lopid Hypothyroidism-continue levothyroxine  DVT prophylaxis: enoxaparin (LOVENOX) injection 40 mg Start: 06/19/23 1800   Code Status:   Code Status: Do not attempt resuscitation (DNR) PRE-ARREST INTERVENTIONS DESIRED  Family Communication: Husband at bedside  Status is: Inpatient  Dispo: The patient is from: Home              Anticipated d/c is to: Home              Anticipated d/c date is: 24 to 48 hours              Patient currently not medically stable for discharge  Consultants:  None  Procedures:  None  Antimicrobials:  Ceftriaxone  Subjective: No acute issues or events overnight, mild nausea this morning but well-controlled on current regimen.  Denies headache fever chills chest pain shortness of breath diarrhea  constipation  Objective: Vitals:   06/19/23 2053 06/19/23 2320 06/20/23 0139 06/20/23 0531  BP: (!) 99/51 (!) 88/41 (!) 94/45 (!) 95/45  Pulse: 79 74 77 76  Resp: 16 16 16 15   Temp: 99.9 F (37.7 C) 99.4 F (37.4 C) 98.4 F (36.9 C) 98.3 F (36.8 C)  TempSrc: Oral Oral Oral Oral  SpO2: 91% 99% 93% 93%  Weight:      Height:        Intake/Output Summary (Last 24 hours) at 06/20/2023 0706 Last data filed at 06/20/2023 0200 Gross per 24 hour  Intake 1309 ml  Output 0 ml  Net 1309 ml   Filed Weights   06/19/23 1111  Weight: 69.9 kg    Examination:  General:  Pleasantly resting in bed, No acute distress. HEENT:  Normocephalic atraumatic.  Sclerae nonicteric, noninjected.  Extraocular movements intact bilaterally. Neck:  Without mass or deformity.  Trachea is midline. Lungs:  Clear to auscultate bilaterally without rhonchi, wheeze, or rales. Heart:  Regular rate and rhythm.  Without murmurs, rubs, or gallops. Abdomen:  Soft, nontender, nondistended.  Without guarding or rebound. Extremities: Without cyanosis, clubbing, edema, or obvious deformity. Skin:  Warm and dry, no erythema.  Data Reviewed: I have personally reviewed following labs and imaging studies  CBC: Recent Labs  Lab 06/19/23 1240 06/20/23 0410  WBC 5.6 5.2  NEUTROABS 3.4  --   HGB 12.3 12.9  HCT 40.2 41.4  MCV 83.8 82.8  PLT 178 102*   Basic Metabolic Panel: Recent Labs  Lab 06/19/23 1240 06/20/23 0410  NA 138 140  K 4.6 4.4  CL 105 115*  CO2 19* 16*  GLUCOSE 216* 103*  BUN 37* 43*  CREATININE 1.57* 2.03*  CALCIUM 9.7 8.4*   GFR: Estimated Creatinine Clearance: 25 mL/min (A) (by C-G formula based on SCr of 2.03 mg/dL (H)).  Liver Function Tests: Recent Labs  Lab 06/19/23 1240  AST 27  ALT 12  ALKPHOS 65  BILITOT 1.1  PROT 8.3*  ALBUMIN 3.9   Coagulation Profile: Recent Labs  Lab 06/19/23 1240  INR 1.2   HbA1C: Recent Labs    06/19/23 1240  HGBA1C 8.0*   CBG: Recent  Labs  Lab 06/19/23 1624 06/19/23 2050 06/20/23 0014  GLUCAP 330* 92 96   Lipid Profile: No results for input(s): "CHOL", "HDL", "LDLCALC", "TRIG", "CHOLHDL", "LDLDIRECT" in the last 72 hours. Thyroid Function Tests: No results for input(s): "TSH", "T4TOTAL", "FREET4", "T3FREE", "THYROIDAB" in the last 72 hours. Anemia Panel: No results for input(s): "VITAMINB12", "FOLATE", "FERRITIN", "TIBC", "IRON", "RETICCTPCT" in the last 72 hours. Sepsis Labs: Recent Labs  Lab 06/19/23 1249  LATICACIDVEN 1.9    Recent Results (from the past 240 hours)  Urine Culture     Status: None (Preliminary result)   Collection Time: 06/19/23 11:22 AM   Specimen: Urine, Clean Catch  Result Value Ref Range Status   Specimen Description   Final    URINE, CLEAN CATCH Performed at [facility], [address], [place], [place] [address]    Special Requests   Final    NONE Reflexed from [id number] Performed at [facility], [address] [place], [place] [address]    Culture PENDING  Incomplete   Report Status PENDING  Incomplete  Resp panel by RT-PCR (RSV, Flu A&B, Covid) Anterior Nasal Swab     Status: None   Collection Time: 06/19/23 11:45 AM   Specimen: Anterior Nasal Swab  Result Value Ref Range Status   SARS Coronavirus 2 by RT PCR NEGATIVE NEGATIVE Final    Comment: (NOTE) SARS-CoV-2 target nucleic acids are NOT DETECTED.  The SARS-CoV-2 RNA is generally detectable in upper respiratory specimens during the acute phase of infection. The lowest concentration of SARS-CoV-2 viral copies this assay can detect is 138 copies/mL. A negative result does not preclude SARS-Cov-2 infection and should not be used as the sole basis for treatment or other patient management decisions. A negative result may occur with  improper specimen collection/handling, submission of specimen other than nasopharyngeal swab, presence of viral mutation(s) within the areas targeted by this assay,  and inadequate number of viral copies(<138 copies/mL). A negative result must be combined with clinical observations, patient history, and epidemiological information. The expected result is Negative.  Fact Sheet for Patients:  BloggerCourse.com  Fact Sheet for Healthcare Providers:  SeriousBroker.it  This test is no t yet approved or cleared by the [place] FDA and  has been authorized for detection and/or diagnosis of SARS-CoV-2 by FDA under an Emergency Use Authorization (EUA). This EUA will remain  in effect (meaning this test can be used) for the duration of the COVID-19 declaration under Section 564(b)(1) of the Act, 21 U.S.C.section 360bbb-3(b)(1), unless the authorization is terminated  or revoked sooner.       Influenza A by PCR NEGATIVE NEGATIVE Final   Influenza B by PCR NEGATIVE NEGATIVE Final    Comment: (NOTE) The Xpert Xpress SARS-CoV-2/FLU/RSV plus assay is intended as an aid in the diagnosis of influenza from Nasopharyngeal swab specimens and should not be used  as a sole basis for treatment. Nasal washings and aspirates are unacceptable for Xpert Xpress SARS-CoV-2/FLU/RSV testing.  Fact Sheet for Patients: BloggerCourse.com  Fact Sheet for Healthcare Providers: SeriousBroker.it  This test is not yet approved or cleared by the [place] FDA and has been authorized for detection and/or diagnosis of SARS-CoV-2 by FDA under an Emergency Use Authorization (EUA). This EUA will remain in effect (meaning this test can be used) for the duration of the COVID-19 declaration under Section 564(b)(1) of the Act, 21 U.S.C. section 360bbb-3(b)(1), unless the authorization is terminated or revoked.     Resp Syncytial Virus by PCR NEGATIVE NEGATIVE Final    Comment: (NOTE) Fact Sheet for Patients: BloggerCourse.com  Fact Sheet for  Healthcare Providers: SeriousBroker.it  This test is not yet approved or cleared by the [place] FDA and has been authorized for detection and/or diagnosis of SARS-CoV-2 by FDA under an Emergency Use Authorization (EUA). This EUA will remain in effect (meaning this test can be used) for the duration of the COVID-19 declaration under Section 564(b)(1) of the Act, 21 U.S.C. section 360bbb-3(b)(1), unless the authorization is terminated or revoked.  Performed at [facility], [address], [place], [place] [address]   Blood Culture (routine x 2)     Status: None (Preliminary result)   Collection Time: 06/19/23 12:05 PM   Specimen: BLOOD RIGHT FOREARM  Result Value Ref Range Status   Specimen Description   Final    BLOOD RIGHT FOREARM Performed at [facility], [address] [place], [place] [address]    Special Requests   Final    BOTTLES DRAWN AEROBIC AND ANAEROBIC Blood Culture results may not be optimal due to an inadequate volume of blood received in culture bottles Performed at [facility], [address] [place], [place] [address]    Culture PENDING  Incomplete   Report Status PENDING  Incomplete         Radiology Studies: DG Chest Port 1 View Result Date: 06/19/2023 CLINICAL DATA:  Fever altered mental status EXAM: PORTABLE CHEST 1 VIEW COMPARISON:  07/04/2022 FINDINGS: Low lung volumes. Rotated patient. Stable cardiomediastinal silhouette. No consolidation, pleural effusion or pneumothorax. IMPRESSION: Low lung volumes. No active disease allowing for patient positioning Electronically Signed   By: Jasmine Pang M.D.   On: 06/19/2023 15:37        Scheduled Meds:  amLODipine  2.5 mg Oral BID   aspirin EC  81 mg Oral Daily   carvedilol  12.5 mg Oral BID WC   enoxaparin (LOVENOX) injection  40 mg Subcutaneous Q24H   ezetimibe  10 mg Oral Daily   gemfibrozil  600 mg Oral BID WC   insulin  aspart  0-15 Units Subcutaneous TID WC   insulin aspart  0-5 Units Subcutaneous QHS   insulin glargine-yfgn  10 Units Subcutaneous Daily   levothyroxine  62.5 mcg Oral QODAY   montelukast  10 mg Oral Daily   sirolimus  2 mg Oral Q1200   Continuous Infusions:  sodium chloride 75 mL/hr at 06/20/23 0217   cefTRIAXone (ROCEPHIN)  IV 1 g (06/19/23 2312)     LOS: 0 days   Time spent:  Azucena Fallen, DO [facility]  If 7PM-7AM, please contact night-coverage [email or web address]  06/20/2023, 7:06 AM

## 2023-06-20 NOTE — Progress Notes (Signed)
Patient has had poor PO intake today. Discussed options and meal supplementation. She reports ensure causes abdominal discomfort/upset.

## 2023-06-21 ENCOUNTER — Other Ambulatory Visit (HOSPITAL_COMMUNITY): Payer: Self-pay

## 2023-06-21 ENCOUNTER — Other Ambulatory Visit (HOSPITAL_COMMUNITY): Payer: Self-pay | Admitting: *Deleted

## 2023-06-21 DIAGNOSIS — Z940 Kidney transplant status: Secondary | ICD-10-CM

## 2023-06-21 DIAGNOSIS — N3000 Acute cystitis without hematuria: Secondary | ICD-10-CM | POA: Diagnosis not present

## 2023-06-21 DIAGNOSIS — N179 Acute kidney failure, unspecified: Secondary | ICD-10-CM | POA: Diagnosis not present

## 2023-06-21 LAB — CBC
HCT: 34.5 % — ABNORMAL LOW (ref 36.0–46.0)
Hemoglobin: 10.1 g/dL — ABNORMAL LOW (ref 12.0–15.0)
MCH: 25.7 pg — ABNORMAL LOW (ref 26.0–34.0)
MCHC: 29.3 g/dL — ABNORMAL LOW (ref 30.0–36.0)
MCV: 87.8 fL (ref 80.0–100.0)
Platelets: 154 10*3/uL (ref 150–400)
RBC: 3.93 MIL/uL (ref 3.87–5.11)
RDW: 18.6 % — ABNORMAL HIGH (ref 11.5–15.5)
WBC: 6.2 10*3/uL (ref 4.0–10.5)
nRBC: 0 % (ref 0.0–0.2)

## 2023-06-21 LAB — URINE CULTURE: Culture: >=100000 — AB

## 2023-06-21 LAB — BASIC METABOLIC PANEL
Anion gap: 10 (ref 5–15)
BUN: 52 mg/dL — ABNORMAL HIGH (ref 8–23)
CO2: 16 mmol/L — ABNORMAL LOW (ref 22–32)
Calcium: 9 mg/dL (ref 8.9–10.3)
Chloride: 113 mmol/L — ABNORMAL HIGH (ref 98–111)
Creatinine, Ser: 1.51 mg/dL — ABNORMAL HIGH (ref 0.44–1.00)
GFR, Estimated: 37 mL/min — ABNORMAL LOW (ref >60)
Glucose, Bld: 79 mg/dL (ref 70–99)
Potassium: 3.6 mmol/L (ref 3.5–5.1)
Sodium: 139 mmol/L (ref 135–145)

## 2023-06-21 LAB — GLUCOSE, CAPILLARY
Glucose-Capillary: 70 mg/dL (ref 70–99)
Glucose-Capillary: 137 mg/dL — ABNORMAL HIGH (ref 70–99)
Glucose-Capillary: 119 mg/dL — ABNORMAL HIGH (ref 70–99)

## 2023-06-21 MED ORDER — CARVEDILOL 3.125 MG PO TABS
12.5000 mg | ORAL_TABLET | Freq: Two times a day (BID) | ORAL | 0 refills | Status: AC
  Filled 2023-06-21: qty 60, 8d supply, fill #0

## 2023-06-21 MED ORDER — SODIUM CHLORIDE 0.9 % IV SOLN: 5.0000 mg/kg | INTRAVENOUS | Status: DC

## 2023-06-21 MED ORDER — FOSFOMYCIN TROMETHAMINE 3 G PO PACK
3.0000 g | PACK | Freq: Once | ORAL | Status: AC
  Administered 2023-06-21: 3 g via ORAL
  Filled 2023-06-21: qty 3

## 2023-06-21 MED ORDER — ENOXAPARIN SODIUM 40 MG/0.4ML IJ SOSY: 40.0000 mg | PREFILLED_SYRINGE | INTRAMUSCULAR | Status: DC

## 2023-06-21 NOTE — Progress Notes (Signed)
   06/21/23 1046  TOC Brief Assessment  Insurance and Status Reviewed  Patient has primary care physician Yes  Home environment has been reviewed home with spouse  Prior level of function: independent  Prior/Current Home Services No current home services  Social Drivers of Health Review SDOH reviewed no interventions necessary  Readmission risk has been reviewed Yes  Transition of care needs no transition of care needs at this time

## 2023-06-21 NOTE — Plan of Care (Signed)
  Problem: Coping: Goal: Ability to adjust to condition or change in health will improve Outcome: Progressing   Problem: Skin Integrity: Goal: Risk for impaired skin integrity will decrease Outcome: Progressing   Problem: Tissue Perfusion: Goal: Adequacy of tissue perfusion will improve Outcome: Progressing   Problem: Pain Managment: Goal: General experience of comfort will improve and/or be controlled Outcome: Progressing   Problem: Safety: Goal: Ability to remain free from injury will improve Outcome: Progressing

## 2023-06-21 NOTE — Progress Notes (Signed)
Discharge paperwork printed and reviewed with the pt. Medication delivered to room from pharmacy and handed to the husband. No concerns made by pt and verbalized understanding. Husband to provide transportation to home.

## 2023-06-21 NOTE — Progress Notes (Signed)
 Medication delivered from [facility] outpatient pharmacy

## 2023-06-21 NOTE — Plan of Care (Signed)

## 2023-06-21 NOTE — Discharge Summary (Signed)
Physician Discharge Summary  Denise Bush ZOX:[id number] DOB: 1951/01/02 DOA: 06/19/2023  PCP: Estevan Oaks, NP  Admit date: 06/19/2023 Discharge date: 06/21/2023  Admitted From: Home Disposition:  Home  Recommendations for Outpatient Follow-up:  Follow up with PCP in 1-2 weeks Follow-up with transplant team as scheduled  Home Health:None  Equipment/Devices: None  Discharge Condition: Stable CODE STATUS: DNR Diet recommendation:    Brief/Interim Summary: Denise Bush is a 73 y.o. female with medical history significant for diabetic retinopathy, hypertension, hypothyroidism, hyperlipidemia, ESRD status post deceased donor renal transplant, insulin-dependent type 2 diabetes being admitted to the hospital with UTI.   Patient admitted as above with poor p.o. intake, found to have UTI without sepsis as well as AKI in setting of renal transplant.  Patient had episode of nausea vomiting and then diarrhea likely secondary to infection and antibiotic use, stool sample returned positive for ESBL E. coli, status post fosfomycin x 1 dose per discussion with pharmacy.  Given previous ceftriaxone dosing and current fosfomycin dosing patient is likely covered at this point from an infectious standpoint.  Patient has plan for follow-up tomorrow for injection per transplant team.  Medication changes as below including discontinuation of amlodipine and decrease dose of carvedilol in the setting of well-controlled hypertension here.  Patient otherwise stable and agreeable for discharge home.  Discharge Diagnoses:  Principal Problem:   UTI (urinary tract infection) Active Problems:   AKI (acute kidney injury) (HCC)  UTI, POA-no evidence of sepsis, hemodynamically stable.   -Prior history of E. Coli UTI with bacteremia in 2024, cultures reviewed. -Continue ceftriaxone, cultures pending for speciation and sensitivities   AKI without history of CKD, status post deceased donor transplant  -  Ongoing, likely prerenal, continue to advance diet, IV fluids ongoing - Continue home dose of Rapamune, due for next belatacept infusion 06/25/2023.   IDDM-continue home Lantus at reduced dose (diminished/poor p.o. intake), and moderate dose sliding scale -resume home medications at discharge   Hypertension-decrease coreg, dc amlodipine as below Hyperlipidemia-continue Zetia, Lopid Hypothyroidism-continue levothyroxine   Discharge Instructions  Discharge Instructions     Call MD for:  difficulty breathing, headache or visual disturbances   Complete by: As directed    Call MD for:  persistant dizziness or light-headedness   Complete by: As directed    Call MD for:  persistant nausea and vomiting   Complete by: As directed    Call MD for:  severe uncontrolled pain   Complete by: As directed    Call MD for:  temperature >100.4   Complete by: As directed    Diet - low sodium heart healthy   Complete by: As directed    Increase activity slowly   Complete by: As directed       Allergies as of 06/21/2023       Reactions   Hydralazine Shortness Of Breath   Chest pain Fatigue    Azor [amlodipine-olmesartan] Other (See Comments)   Sharp chest pain    Benadryl [diphenhydramine] Other (See Comments)   Unknown reaction   Camellia Other (See Comments)   Unknown reaction   Denaverine Other (See Comments)   Unknown reaction   Dyazide [hydrochlorothiazide W-triamterene] Swelling, Other (See Comments)   Chest pain Throat swelling   Glucophage [metformin] Diarrhea, Itching, Nausea Only   Bowel incontinence    Hydrochlorothiazide Other (See Comments)   "contractions in throat"   Laureth Other (See Comments)   Unknown reaction   Prednisone Other (See Comments)  Elevated Glucose    Reglan [metoclopramide] Other (See Comments)   Worsening acid reflux   Trandate [labetalol] Other (See Comments)   Foot pain   Zestril [lisinopril] Other (See Comments)   Severe radiating foot pain.    Aldactone [spironolactone] Rash, Other (See Comments)   Severe radiating pain in feet   Rogaine [minoxidil] Rash, Other (See Comments)   Fatigue  Choking, throat contractions   Statins Rash   Fatigue        Medication List     STOP taking these medications    amLODipine 2.5 MG tablet Commonly known as: NORVASC       TAKE these medications    acetaminophen 500 MG tablet Commonly known as: TYLENOL Take 500 mg by mouth daily as needed for mild pain (pain score 1-3).   aspirin EC 81 MG tablet Take 1 tablet (81 mg total) by mouth daily.   BELATACEPT IV Inject 1 Dose into the vein every 28 (twenty-eight) days.   carvedilol 3.125 MG tablet Commonly known as: COREG Take 4 tablets (12.5 mg total) by mouth 2 (two) times daily with a meal. What changed: medication strength   ezetimibe 10 MG tablet Commonly known as: ZETIA Take 1 tablet (10 mg total) by mouth daily.   gemfibrozil 600 MG tablet Commonly known as: LOPID TAKE 1 TABLET TWICE A DAY WITH MEALS FOR CHOLESTEROL AND TRIGLYCERIDES What changed: See the new instructions.   insulin lispro 100 UNIT/ML KwikPen Commonly known as: HUMALOG Inject 3-5 Units into the skin 2 (two) times daily as needed. Inject 3-5 units up to twice daily with meals as needed for hyperglycemia. Dose dependant on fasting blood sugar and meal content.   ketoconazole 2 % cream Commonly known as: NIZORAL Apply 1 Application topically daily as needed for irritation (redness, scaling).   Lantus SoloStar 100 UNIT/ML Solostar Pen Generic drug: insulin glargine Inject 21 Units into the skin daily.   levothyroxine 125 MCG tablet Commonly known as: SYNTHROID Take 62.5 mcg by mouth every other day.   linagliptin 5 MG Tabs tablet Commonly known as: TRADJENTA Take 5 mg by mouth daily.   montelukast 10 MG tablet Commonly known as: SINGULAIR Take 10 mg by mouth daily.   sirolimus 1 MG tablet Commonly known as: RAPAMUNE Take    1 tablet      Daily     for Kidney Transplant What changed:  how much to take how to take this when to take this additional instructions        Allergies  Allergen Reactions   Hydralazine Shortness Of Breath    Chest pain Fatigue    Azor [Amlodipine-Olmesartan] Other (See Comments)    Sharp chest pain     Benadryl [Diphenhydramine] Other (See Comments)    Unknown reaction   Camellia Other (See Comments)    Unknown reaction   Denaverine Other (See Comments)    Unknown reaction   Dyazide [Hydrochlorothiazide W-Triamterene] Swelling and Other (See Comments)    Chest pain Throat swelling   Glucophage [Metformin] Diarrhea, Itching and Nausea Only    Bowel incontinence    Hydrochlorothiazide Other (See Comments)    "contractions in throat"    Laureth Other (See Comments)    Unknown reaction   Prednisone Other (See Comments)    Elevated Glucose    Reglan [Metoclopramide] Other (See Comments)    Worsening acid reflux   Trandate [Labetalol] Other (See Comments)    Foot pain   Zestril [Lisinopril] Other (See Comments)  Severe radiating foot pain.   Aldactone [Spironolactone] Rash and Other (See Comments)    Severe radiating pain in feet    Rogaine [Minoxidil] Rash and Other (See Comments)    Fatigue  Choking, throat contractions   Statins Rash    Fatigue    Consultations: None   Procedures/Studies: DG Chest Port 1 View Result Date: 06/19/2023 CLINICAL DATA:  Fever altered mental status EXAM: PORTABLE CHEST 1 VIEW COMPARISON:  07/04/2022 FINDINGS: Low lung volumes. Rotated patient. Stable cardiomediastinal silhouette. No consolidation, pleural effusion or pneumothorax. IMPRESSION: Low lung volumes. No active disease allowing for patient positioning Electronically Signed   By: Jasmine Pang M.D.   On: 06/19/2023 15:37     Subjective: No acute issues/events overnight   Discharge Exam: Vitals:   06/21/23 0919 06/21/23 1400  BP: (!) 127/49 132/66  Pulse: 76 76  Resp: 18 17   Temp: 98.1 F (36.7 C) 98.5 F (36.9 C)  SpO2: 96% 96%   Vitals:   06/21/23 0147 06/21/23 0606 06/21/23 0919 06/21/23 1400  BP: (!) 117/56 (!) 122/46 (!) 127/49 132/66  Pulse: 88 76 76 76  Resp: 16 16 18 17   Temp: 98.3 F (36.8 C) 98.6 F (37 C) 98.1 F (36.7 C) 98.5 F (36.9 C)  TempSrc: Oral Oral Oral Oral  SpO2: 93% 92% 96% 96%  Weight:      Height:        General: Pt is alert, awake, not in acute distress Cardiovascular: RRR, S1/S2 +, no rubs, no gallops Respiratory: CTA bilaterally, no wheezing, no rhonchi Abdominal: Soft, NT, ND, bowel sounds + Extremities: no edema, no cyanosis    The results of significant diagnostics from this hospitalization (including imaging, microbiology, ancillary and laboratory) are listed below for reference.     Microbiology: Recent Results (from the past 240 hours)  Urine Culture     Status: Abnormal   Collection Time: 06/19/23 11:22 AM   Specimen: Urine, Clean Catch  Result Value Ref Range Status   Specimen Description   Final    URINE, CLEAN CATCH Performed at [facility], [address] [place], [place] [address]    Special Requests   Final    NONE Reflexed from [id number] Performed at [facility], [address] [place], [place] [address]    Culture   Final    Two isolates with different morphologies were identified as the same organism.The most resistant organism was reported. >=100,000 COLONIES/mL ESCHERICHIA COLI    Report Status 06/21/2023 FINAL  Final   Organism ID, Bacteria ESCHERICHIA COLI (A)  Final      Susceptibility   Escherichia coli - MIC*    AMPICILLIN >=32 RESISTANT Resistant     CEFAZOLIN >=64 RESISTANT Resistant     CEFEPIME 0.5 SENSITIVE Sensitive     CEFTRIAXONE >=64 RESISTANT Resistant     CIPROFLOXACIN <=0.25 SENSITIVE Sensitive     GENTAMICIN <=1 SENSITIVE Sensitive     IMIPENEM <=0.25 SENSITIVE Sensitive     NITROFURANTOIN <=16 SENSITIVE Sensitive     TRIMETH/SULFA  <=20 SENSITIVE Sensitive     AMPICILLIN/SULBACTAM >=32 RESISTANT Resistant     PIP/TAZO 16 SENSITIVE Sensitive ug/mL    * >=100,000 COLONIES/mL ESCHERICHIA COLI  Resp panel by RT-PCR (RSV, Flu A&B, Covid) Anterior Nasal Swab     Status: None   Collection Time: 06/19/23 11:45 AM   Specimen: Anterior Nasal Swab  Result Value Ref Range Status   SARS Coronavirus 2 by RT PCR  NEGATIVE NEGATIVE Final    Comment: (NOTE) SARS-CoV-2 target nucleic acids are NOT DETECTED.  The SARS-CoV-2 RNA is generally detectable in upper respiratory specimens during the acute phase of infection. The lowest concentration of SARS-CoV-2 viral copies this assay can detect is 138 copies/mL. A negative result does not preclude SARS-Cov-2 infection and should not be used as the sole basis for treatment or other patient management decisions. A negative result may occur with  improper specimen collection/handling, submission of specimen other than nasopharyngeal swab, presence of viral mutation(s) within the areas targeted by this assay, and inadequate number of viral copies(<138 copies/mL). A negative result must be combined with clinical observations, patient history, and epidemiological information. The expected result is Negative.  Fact Sheet for Patients:  BloggerCourse.com  Fact Sheet for Healthcare Providers:  SeriousBroker.it  This test is no t yet approved or cleared by the [place] FDA and  has been authorized for detection and/or diagnosis of SARS-CoV-2 by FDA under an Emergency Use Authorization (EUA). This EUA will remain  in effect (meaning this test can be used) for the duration of the COVID-19 declaration under Section 564(b)(1) of the Act, 21 U.S.C.section 360bbb-3(b)(1), unless the authorization is terminated  or revoked sooner.       Influenza A by PCR NEGATIVE NEGATIVE Final   Influenza B by PCR NEGATIVE NEGATIVE Final    Comment:  (NOTE) The Xpert Xpress SARS-CoV-2/FLU/RSV plus assay is intended as an aid in the diagnosis of influenza from Nasopharyngeal swab specimens and should not be used as a sole basis for treatment. Nasal washings and aspirates are unacceptable for Xpert Xpress SARS-CoV-2/FLU/RSV testing.  Fact Sheet for Patients: BloggerCourse.com  Fact Sheet for Healthcare Providers: SeriousBroker.it  This test is not yet approved or cleared by the [place] FDA and has been authorized for detection and/or diagnosis of SARS-CoV-2 by FDA under an Emergency Use Authorization (EUA). This EUA will remain in effect (meaning this test can be used) for the duration of the COVID-19 declaration under Section 564(b)(1) of the Act, 21 U.S.C. section 360bbb-3(b)(1), unless the authorization is terminated or revoked.     Resp Syncytial Virus by PCR NEGATIVE NEGATIVE Final    Comment: (NOTE) Fact Sheet for Patients: BloggerCourse.com  Fact Sheet for Healthcare Providers: SeriousBroker.it  This test is not yet approved or cleared by the [place] FDA and has been authorized for detection and/or diagnosis of SARS-CoV-2 by FDA under an Emergency Use Authorization (EUA). This EUA will remain in effect (meaning this test can be used) for the duration of the COVID-19 declaration under Section 564(b)(1) of the Act, 21 U.S.C. section 360bbb-3(b)(1), unless the authorization is terminated or revoked.  Performed at [facility], [address], [place], [place] [address]   Blood Culture (routine x 2)     Status: None (Preliminary result)   Collection Time: 06/19/23 12:00 PM   Specimen: BLOOD  Result Value Ref Range Status   Specimen Description   Final    BLOOD RIGHT ANTECUBITAL Performed at [facility], [address], [place], [place] [address]    Special Requests    Final    BOTTLES DRAWN AEROBIC AND ANAEROBIC Blood Culture results may not be optimal due to an inadequate volume of blood received in culture bottles Performed at [facility], [address], [place], [place] [address]    Culture   Final    NO GROWTH 2 DAYS Performed at [facility], [address], [place], [place] [address]    Report Status PENDING  Incomplete  Blood Culture (routine x 2)     Status: None (Preliminary result)   Collection Time: 06/19/23 12:05 PM   Specimen: BLOOD RIGHT FOREARM  Result Value Ref Range Status   Specimen Description   Final    BLOOD RIGHT FOREARM Performed at [facility], [address], [place], [place] [address]    Special Requests   Final    BOTTLES DRAWN AEROBIC AND ANAEROBIC Blood Culture results may not be optimal due to an inadequate volume of blood received in culture bottles Performed at [facility], [address] [place], [place] [address]    Culture   Final    NO GROWTH 2 DAYS Performed at [facility], [address], [place], [place] [address]    Report Status PENDING  Incomplete     Labs: BNP (last 3 results) No results for input(s): "BNP" in the last 8760 hours. Basic Metabolic Panel: Recent Labs  Lab 06/19/23 1240 06/20/23 0410 06/21/23 0633  NA 138 140 139  K 4.6 4.4 3.6  CL 105 115* 113*  CO2 19* 16* 16*  GLUCOSE 216* 103* 79  BUN 37* 43* 52*  CREATININE 1.57* 2.03* 1.51*  CALCIUM 9.7 8.4* 9.0   Liver Function Tests: Recent Labs  Lab 06/19/23 1240  AST 27  ALT 12  ALKPHOS 65  BILITOT 1.1  PROT 8.3*  ALBUMIN 3.9   No results for input(s): "LIPASE", "AMYLASE" in the last 168 hours. No results for input(s): "AMMONIA" in the last 168 hours. CBC: Recent Labs  Lab 06/19/23 1240 06/20/23 0410 06/21/23 0823  WBC 5.6 5.2 6.2  NEUTROABS 3.4  --   --   HGB 12.3 12.9 10.1*  HCT 40.2 41.4 34.5*  MCV 83.8 82.8 87.8  PLT 178 102* 154   Cardiac  Enzymes: No results for input(s): "CKTOTAL", "CKMB", "CKMBINDEX", "TROPONINI" in the last 168 hours. BNP: Invalid input(s): "POCBNP" CBG: Recent Labs  Lab 06/20/23 2101 06/20/23 2255 06/21/23 0734 06/21/23 0916 06/21/23 1144  GLUCAP 103* 103* 70 137* 119*   D-Dimer No results for input(s): "DDIMER" in the last 72 hours. Hgb A1c Recent Labs    06/19/23 1240  HGBA1C 8.0*   Lipid Profile No results for input(s): "CHOL", "HDL", "LDLCALC", "TRIG", "CHOLHDL", "LDLDIRECT" in the last 72 hours. Thyroid function studies No results for input(s): "TSH", "T4TOTAL", "T3FREE", "THYROIDAB" in the last 72 hours.  Invalid input(s): "FREET3" Anemia work up No results for input(s): "VITAMINB12", "FOLATE", "FERRITIN", "TIBC", "IRON", "RETICCTPCT" in the last 72 hours. Urinalysis    Component Value Date/Time   COLORURINE YELLOW 06/19/2023 1122   APPEARANCEUR TURBID (A) 06/19/2023 1122   LABSPEC 1.015 06/19/2023 1122   PHURINE 5.0 06/19/2023 1122   GLUCOSEU NEGATIVE 06/19/2023 1122   HGBUR LARGE (A) 06/19/2023 1122   BILIRUBINUR NEGATIVE 06/19/2023 1122   KETONESUR NEGATIVE 06/19/2023 1122   PROTEINUR 100 (A) 06/19/2023 1122   NITRITE NEGATIVE 06/19/2023 1122   LEUKOCYTESUR LARGE (A) 06/19/2023 1122   Sepsis Labs Recent Labs  Lab 06/19/23 1240 06/20/23 0410 06/21/23 0823  WBC 5.6 5.2 6.2   Microbiology Recent Results (from the past 240 hours)  Urine Culture     Status: Abnormal   Collection Time: 06/19/23 11:22 AM   Specimen: Urine, Clean Catch  Result Value Ref Range Status   Specimen Description   Final    URINE, CLEAN CATCH Performed at [facility], [address], [place], [place] [address]    Special Requests   Final    NONE Reflexed from [id number] Performed at [facility], [address], [place], [place] [address]    Culture   Final    Two isolates with different morphologies were identified as the same organism.The most resistant organism  was reported. >=100,000 COLONIES/mL ESCHERICHIA COLI    Report Status 06/21/2023 FINAL  Final   Organism ID, Bacteria ESCHERICHIA COLI (A)  Final      Susceptibility   Escherichia coli - MIC*    AMPICILLIN >=32 RESISTANT Resistant     CEFAZOLIN >=64 RESISTANT Resistant     CEFEPIME 0.5 SENSITIVE Sensitive     CEFTRIAXONE >=64 RESISTANT Resistant     CIPROFLOXACIN <=0.25 SENSITIVE Sensitive     GENTAMICIN <=1 SENSITIVE Sensitive     IMIPENEM <=0.25 SENSITIVE Sensitive     NITROFURANTOIN <=16 SENSITIVE Sensitive     TRIMETH/SULFA <=20 SENSITIVE Sensitive     AMPICILLIN/SULBACTAM >=32 RESISTANT Resistant     PIP/TAZO 16 SENSITIVE Sensitive ug/mL    * >=100,000 COLONIES/mL ESCHERICHIA COLI  Resp panel by RT-PCR (RSV, Flu A&B, Covid) Anterior Nasal Swab     Status: None   Collection Time: 06/19/23 11:45 AM   Specimen: Anterior Nasal Swab  Result Value Ref Range Status   SARS Coronavirus 2 by RT PCR NEGATIVE NEGATIVE Final    Comment: (NOTE) SARS-CoV-2 target nucleic acids are NOT DETECTED.  The SARS-CoV-2 RNA is generally detectable in upper respiratory specimens during the acute phase of infection. The lowest concentration of SARS-CoV-2 viral copies this assay can detect is 138 copies/mL. A negative result does not preclude SARS-Cov-2 infection and should not be used as the sole basis for treatment or other patient management decisions. A negative result may occur with  improper specimen collection/handling, submission of specimen other than nasopharyngeal swab, presence of viral mutation(s) within the areas targeted by this assay, and inadequate number of viral copies(<138 copies/mL). A negative result must be combined with clinical observations, patient history, and epidemiological information. The expected result is Negative.  Fact Sheet for Patients:  BloggerCourse.com  Fact Sheet for Healthcare Providers:   SeriousBroker.it  This test is no t yet approved or cleared by the [place] FDA and  has been authorized for detection and/or diagnosis of SARS-CoV-2 by FDA under an Emergency Use Authorization (EUA). This EUA will remain  in effect (meaning this test can be used) for the duration of the COVID-19 declaration under Section 564(b)(1) of the Act, 21 U.S.C.section 360bbb-3(b)(1), unless the authorization is terminated  or revoked sooner.       Influenza A by PCR NEGATIVE NEGATIVE Final   Influenza B by PCR NEGATIVE NEGATIVE Final    Comment: (NOTE) The Xpert Xpress SARS-CoV-2/FLU/RSV plus assay is intended as an aid in the diagnosis of influenza from Nasopharyngeal swab specimens and should not be used as a sole basis for treatment. Nasal washings and aspirates are unacceptable for Xpert Xpress SARS-CoV-2/FLU/RSV testing.  Fact Sheet for Patients: BloggerCourse.com  Fact Sheet for Healthcare Providers: SeriousBroker.it  This test is not yet approved or cleared by the [place] FDA and has been authorized for detection and/or diagnosis of SARS-CoV-2 by FDA under an Emergency Use Authorization (EUA). This EUA will remain in effect (meaning this test can be used) for the duration of the COVID-19 declaration under Section 564(b)(1) of the Act, 21 U.S.C. section 360bbb-3(b)(1), unless the authorization is  terminated or revoked.     Resp Syncytial Virus by PCR NEGATIVE NEGATIVE Final    Comment: (NOTE) Fact Sheet for Patients: BloggerCourse.com  Fact Sheet for Healthcare Providers: SeriousBroker.it  This test is not yet approved or cleared by the [place] FDA and has been authorized for detection and/or diagnosis of SARS-CoV-2 by FDA under an Emergency Use Authorization (EUA). This EUA will remain in effect (meaning this test can be used) for  the duration of the COVID-19 declaration under Section 564(b)(1) of the Act, 21 U.S.C. section 360bbb-3(b)(1), unless the authorization is terminated or revoked.  Performed at [facility], [address], [place], [place] [address]   Blood Culture (routine x 2)     Status: None (Preliminary result)   Collection Time: 06/19/23 12:00 PM   Specimen: BLOOD  Result Value Ref Range Status   Specimen Description   Final    BLOOD RIGHT ANTECUBITAL Performed at [facility], [address] [place], [place] [address]    Special Requests   Final    BOTTLES DRAWN AEROBIC AND ANAEROBIC Blood Culture results may not be optimal due to an inadequate volume of blood received in culture bottles Performed at [facility], [address] [place], [place] [address]    Culture   Final    NO GROWTH 2 DAYS Performed at [facility], [address], [place], [place] [address]    Report Status PENDING  Incomplete  Blood Culture (routine x 2)     Status: None (Preliminary result)   Collection Time: 06/19/23 12:05 PM   Specimen: BLOOD RIGHT FOREARM  Result Value Ref Range Status   Specimen Description   Final    BLOOD RIGHT FOREARM Performed at [facility], [address], [place], [place] [address]    Special Requests   Final    BOTTLES DRAWN AEROBIC AND ANAEROBIC Blood Culture results may not be optimal due to an inadequate volume of blood received in culture bottles Performed at [facility], [address] [place], [place] [address]    Culture   Final    NO GROWTH 2 DAYS Performed at [facility], [address] [place], [place] [address]    Report Status PENDING  Incomplete     Time coordinating discharge: Over 30 minutes  SIGNED:   Azucena Fallen, DO [facility] 06/21/2023, 2:20 PM Pager   If 7PM-7AM, please contact night-coverage [email or web address]

## 2023-06-22 ENCOUNTER — Ambulatory Visit (HOSPITAL_COMMUNITY)
Admission: RE | Admit: 2023-06-22 | Discharge: 2023-06-22 | Disposition: A | Discharge status: Home / Self Care | Payer: Medicare Other | Source: Ambulatory Visit | Attending: Internal Medicine | Admitting: Internal Medicine

## 2023-06-22 ENCOUNTER — Inpatient Hospital Stay (HOSPITAL_COMMUNITY): Admission: RE | Admit: 2023-06-22 | Payer: Medicare Other | Source: Ambulatory Visit

## 2023-06-22 DIAGNOSIS — Z940 Kidney transplant status: Secondary | ICD-10-CM | POA: Insufficient documentation

## 2023-06-22 MED ORDER — SODIUM CHLORIDE 0.9 % IV SOLN
5.0000 mg/kg | INTRAVENOUS | Status: DC
  Administered 2023-06-22: 350 mg via INTRAVENOUS
  Filled 2023-06-22: qty 350

## 2023-06-24 LAB — CULTURE, BLOOD (ROUTINE X 2)
Culture: NO GROWTH
Culture: NO GROWTH

## 2023-07-12 ENCOUNTER — Other Ambulatory Visit: Payer: Self-pay | Admitting: Nurse Practitioner

## 2023-07-12 DIAGNOSIS — Z0000 Encounter for general adult medical examination without abnormal findings: Secondary | ICD-10-CM

## 2023-07-20 ENCOUNTER — Ambulatory Visit (HOSPITAL_COMMUNITY)
Admission: RE | Admit: 2023-07-20 | Discharge: 2023-07-20 | Disposition: A | Discharge status: Home / Self Care | Payer: Medicare Other | Source: Ambulatory Visit | Attending: Internal Medicine | Admitting: Internal Medicine

## 2023-07-20 DIAGNOSIS — Z7969 Long term (current) use of other immunomodulators and immunosuppressants: Secondary | ICD-10-CM | POA: Insufficient documentation

## 2023-07-20 DIAGNOSIS — Z940 Kidney transplant status: Secondary | ICD-10-CM | POA: Insufficient documentation

## 2023-07-20 DIAGNOSIS — Z4822 Encounter for aftercare following kidney transplant: Secondary | ICD-10-CM | POA: Insufficient documentation

## 2023-07-20 MED ORDER — SODIUM CHLORIDE 0.9 % IV SOLN
5.0000 mg/kg | INTRAVENOUS | Status: DC
  Administered 2023-07-20: 350 mg via INTRAVENOUS
  Filled 2023-07-20: qty 350

## 2023-08-13 ENCOUNTER — Ambulatory Visit

## 2023-08-15 ENCOUNTER — Other Ambulatory Visit (HOSPITAL_COMMUNITY): Payer: Self-pay

## 2023-08-15 ENCOUNTER — Ambulatory Visit
Admission: RE | Admit: 2023-08-15 | Discharge: 2023-08-15 | Disposition: A | Discharge status: Home / Self Care | Source: Ambulatory Visit | Attending: Nurse Practitioner | Admitting: Nurse Practitioner

## 2023-08-15 DIAGNOSIS — Z0000 Encounter for general adult medical examination without abnormal findings: Secondary | ICD-10-CM

## 2023-08-17 ENCOUNTER — Ambulatory Visit (HOSPITAL_COMMUNITY)
Admission: RE | Admit: 2023-08-17 | Discharge: 2023-08-17 | Disposition: A | Discharge status: Home / Self Care | Source: Ambulatory Visit | Attending: Internal Medicine | Admitting: Internal Medicine

## 2023-08-17 DIAGNOSIS — Z940 Kidney transplant status: Secondary | ICD-10-CM | POA: Insufficient documentation

## 2023-08-17 LAB — CBC WITH DIFFERENTIAL/PLATELET
Abs Immature Granulocytes: 0.11 10*3/uL — ABNORMAL HIGH (ref 0.00–0.07)
Basophils Absolute: 0 10*3/uL (ref 0.0–0.1)
Basophils Relative: 1 %
Eosinophils Absolute: 0.1 10*3/uL (ref 0.0–0.5)
Eosinophils Relative: 3 %
HCT: 35.7 % — ABNORMAL LOW (ref 36.0–46.0)
Hemoglobin: 10.8 g/dL — ABNORMAL LOW (ref 12.0–15.0)
Immature Granulocytes: 3 %
Lymphocytes Relative: 32 %
Lymphs Abs: 1.1 10*3/uL (ref 0.7–4.0)
MCH: 25.5 pg — ABNORMAL LOW (ref 26.0–34.0)
MCHC: 30.3 g/dL (ref 30.0–36.0)
MCV: 84.2 fL (ref 80.0–100.0)
Monocytes Absolute: 0.6 10*3/uL (ref 0.1–1.0)
Monocytes Relative: 18 %
Neutro Abs: 1.5 10*3/uL — ABNORMAL LOW (ref 1.7–7.7)
Neutrophils Relative %: 43 %
Platelets: 207 10*3/uL (ref 150–400)
RBC: 4.24 MIL/uL (ref 3.87–5.11)
RDW: 18.3 % — ABNORMAL HIGH (ref 11.5–15.5)
WBC: 3.5 10*3/uL — ABNORMAL LOW (ref 4.0–10.5)
nRBC: 0 % (ref 0.0–0.2)

## 2023-08-17 LAB — URINALYSIS, COMPLETE (UACMP) WITH MICROSCOPIC
Bacteria, UA: NONE SEEN
Bilirubin Urine: NEGATIVE
Glucose, UA: 50 mg/dL — AB
Hgb urine dipstick: NEGATIVE
Ketones, ur: NEGATIVE mg/dL
Nitrite: NEGATIVE
Protein, ur: 100 mg/dL — AB
Specific Gravity, Urine: 1.019 (ref 1.005–1.030)
pH: 5 (ref 5.0–8.0)

## 2023-08-17 LAB — BASIC METABOLIC PANEL WITH GFR
Anion gap: 8 (ref 5–15)
BUN: 21 mg/dL (ref 8–23)
CO2: 20 mmol/L — ABNORMAL LOW (ref 22–32)
Calcium: 9.4 mg/dL (ref 8.9–10.3)
Chloride: 113 mmol/L — ABNORMAL HIGH (ref 98–111)
Creatinine, Ser: 0.98 mg/dL (ref 0.44–1.00)
GFR, Estimated: >60 mL/min (ref >60)
Glucose, Bld: 183 mg/dL — ABNORMAL HIGH (ref 70–99)
Potassium: 5 mmol/L (ref 3.5–5.1)
Sodium: 141 mmol/L (ref 135–145)

## 2023-08-17 LAB — PROTEIN / CREATININE RATIO, URINE
Creatinine, Urine: 147 mg/dL
Protein Creatinine Ratio: 0.65 mg/mg{creat} — ABNORMAL HIGH (ref 0.00–0.15)
Total Protein, Urine: 95 mg/dL

## 2023-08-17 MED ORDER — SODIUM CHLORIDE 0.9 % IV SOLN
5.0000 mg/kg | INTRAVENOUS | Status: DC
  Administered 2023-08-17: 350 mg via INTRAVENOUS
  Filled 2023-08-17 (×2): qty 350

## 2023-08-19 LAB — SIROLIMUS LEVEL: Sirolimus (Rapamycin): 5.3 ng/mL (ref 3.0–20.0)

## 2023-09-14 ENCOUNTER — Ambulatory Visit (HOSPITAL_COMMUNITY)
Admission: RE | Admit: 2023-09-14 | Discharge: 2023-09-14 | Disposition: A | Discharge status: Home / Self Care | Source: Ambulatory Visit | Attending: Internal Medicine | Admitting: Internal Medicine

## 2023-09-14 DIAGNOSIS — Z940 Kidney transplant status: Secondary | ICD-10-CM | POA: Diagnosis present

## 2023-09-14 LAB — CBC WITH DIFFERENTIAL/PLATELET
Abs Immature Granulocytes: 0.05 10*3/uL (ref 0.00–0.07)
Basophils Absolute: 0 10*3/uL (ref 0.0–0.1)
Basophils Relative: 1 %
Eosinophils Absolute: 0.1 10*3/uL (ref 0.0–0.5)
Eosinophils Relative: 4 %
HCT: 37.6 % (ref 36.0–46.0)
Hemoglobin: 11.5 g/dL — ABNORMAL LOW (ref 12.0–15.0)
Immature Granulocytes: 2 %
Lymphocytes Relative: 37 %
Lymphs Abs: 1.1 10*3/uL (ref 0.7–4.0)
MCH: 25.6 pg — ABNORMAL LOW (ref 26.0–34.0)
MCHC: 30.6 g/dL (ref 30.0–36.0)
MCV: 83.6 fL (ref 80.0–100.0)
Monocytes Absolute: 0.6 10*3/uL (ref 0.1–1.0)
Monocytes Relative: 19 %
Neutro Abs: 1.2 10*3/uL — ABNORMAL LOW (ref 1.7–7.7)
Neutrophils Relative %: 37 %
Platelets: 204 10*3/uL (ref 150–400)
RBC: 4.5 MIL/uL (ref 3.87–5.11)
RDW: 18.1 % — ABNORMAL HIGH (ref 11.5–15.5)
WBC: 3.1 10*3/uL — ABNORMAL LOW (ref 4.0–10.5)
nRBC: 0 % (ref 0.0–0.2)

## 2023-09-14 LAB — URINALYSIS, COMPLETE (UACMP) WITH MICROSCOPIC
Bacteria, UA: NONE SEEN
Bilirubin Urine: NEGATIVE
Glucose, UA: NEGATIVE mg/dL
Hgb urine dipstick: NEGATIVE
Ketones, ur: NEGATIVE mg/dL
Nitrite: NEGATIVE
Protein, ur: 100 mg/dL — AB
Specific Gravity, Urine: 1.016 (ref 1.005–1.030)
pH: 5 (ref 5.0–8.0)

## 2023-09-14 LAB — BASIC METABOLIC PANEL WITH GFR
Anion gap: 9 (ref 5–15)
BUN: 23 mg/dL (ref 8–23)
CO2: 22 mmol/L (ref 22–32)
Calcium: 9.6 mg/dL (ref 8.9–10.3)
Chloride: 112 mmol/L — ABNORMAL HIGH (ref 98–111)
Creatinine, Ser: 0.98 mg/dL (ref 0.44–1.00)
GFR, Estimated: >60 mL/min (ref >60)
Glucose, Bld: 73 mg/dL (ref 70–99)
Potassium: 4 mmol/L (ref 3.5–5.1)
Sodium: 143 mmol/L (ref 135–145)

## 2023-09-14 LAB — PROTEIN / CREATININE RATIO, URINE
Creatinine, Urine: 88 mg/dL
Protein Creatinine Ratio: 0.65 mg/mg{creat} — ABNORMAL HIGH (ref 0.00–0.15)
Total Protein, Urine: 57 mg/dL

## 2023-09-14 MED ORDER — SODIUM CHLORIDE 0.9 % IV SOLN
5.0000 mg/kg | INTRAVENOUS | Status: DC
  Administered 2023-09-14: 350 mg via INTRAVENOUS
  Filled 2023-09-14: qty 350

## 2023-09-17 LAB — SIROLIMUS LEVEL: Sirolimus (Rapamycin): 5.7 ng/mL (ref 3.0–20.0)

## 2023-10-12 ENCOUNTER — Encounter (HOSPITAL_COMMUNITY)
Admission: RE | Admit: 2023-10-12 | Discharge: 2023-10-12 | Disposition: A | Discharge status: Home / Self Care | Source: Ambulatory Visit | Attending: Internal Medicine | Admitting: Internal Medicine

## 2023-10-12 DIAGNOSIS — Z940 Kidney transplant status: Secondary | ICD-10-CM | POA: Insufficient documentation

## 2023-10-12 LAB — URINALYSIS, COMPLETE (UACMP) WITH MICROSCOPIC
Bacteria, UA: NONE SEEN
Bilirubin Urine: NEGATIVE
Glucose, UA: NEGATIVE mg/dL
Hgb urine dipstick: NEGATIVE
Ketones, ur: NEGATIVE mg/dL
Nitrite: NEGATIVE
Protein, ur: 100 mg/dL — AB
Specific Gravity, Urine: 1.014 (ref 1.005–1.030)
pH: 5 (ref 5.0–8.0)

## 2023-10-12 LAB — CBC WITH DIFFERENTIAL/PLATELET
Abs Immature Granulocytes: 0.17 10*3/uL — ABNORMAL HIGH (ref 0.00–0.07)
Basophils Absolute: 0 10*3/uL (ref 0.0–0.1)
Basophils Relative: 1 %
Eosinophils Absolute: 0.2 10*3/uL (ref 0.0–0.5)
Eosinophils Relative: 5 %
HCT: 37.8 % (ref 36.0–46.0)
Hemoglobin: 11.5 g/dL — ABNORMAL LOW (ref 12.0–15.0)
Immature Granulocytes: 5 %
Lymphocytes Relative: 33 %
Lymphs Abs: 1.1 10*3/uL (ref 0.7–4.0)
MCH: 25.5 pg — ABNORMAL LOW (ref 26.0–34.0)
MCHC: 30.4 g/dL (ref 30.0–36.0)
MCV: 83.8 fL (ref 80.0–100.0)
Monocytes Absolute: 0.6 10*3/uL (ref 0.1–1.0)
Monocytes Relative: 17 %
Neutro Abs: 1.3 10*3/uL — ABNORMAL LOW (ref 1.7–7.7)
Neutrophils Relative %: 39 %
Platelets: 161 10*3/uL (ref 150–400)
RBC: 4.51 MIL/uL (ref 3.87–5.11)
RDW: 17.9 % — ABNORMAL HIGH (ref 11.5–15.5)
WBC: 3.5 10*3/uL — ABNORMAL LOW (ref 4.0–10.5)
nRBC: 0 % (ref 0.0–0.2)

## 2023-10-12 LAB — BASIC METABOLIC PANEL WITH GFR
Anion gap: 11 (ref 5–15)
BUN: 29 mg/dL — ABNORMAL HIGH (ref 8–23)
CO2: 20 mmol/L — ABNORMAL LOW (ref 22–32)
Calcium: 9.6 mg/dL (ref 8.9–10.3)
Chloride: 112 mmol/L — ABNORMAL HIGH (ref 98–111)
Creatinine, Ser: 1.05 mg/dL — ABNORMAL HIGH (ref 0.44–1.00)
GFR, Estimated: 56 mL/min — ABNORMAL LOW (ref >60)
Glucose, Bld: 93 mg/dL (ref 70–99)
Potassium: 4 mmol/L (ref 3.5–5.1)
Sodium: 143 mmol/L (ref 135–145)

## 2023-10-12 LAB — PROTEIN / CREATININE RATIO, URINE
Creatinine, Urine: 80 mg/dL
Protein Creatinine Ratio: 0.45 mg/mg{creat} — ABNORMAL HIGH (ref 0.00–0.15)
Total Protein, Urine: 36 mg/dL

## 2023-10-12 MED ORDER — SODIUM CHLORIDE 0.9 % IV SOLN
5.0000 mg/kg | INTRAVENOUS | Status: DC
  Administered 2023-10-12: 350 mg via INTRAVENOUS
  Filled 2023-10-12: qty 350

## 2023-10-15 LAB — SIROLIMUS LEVEL: Sirolimus (Rapamycin): 7.2 ng/mL (ref 3.0–20.0)

## 2023-11-09 ENCOUNTER — Ambulatory Visit (HOSPITAL_COMMUNITY)
Admission: RE | Admit: 2023-11-09 | Discharge: 2023-11-09 | Disposition: A | Discharge status: Home / Self Care | Source: Ambulatory Visit | Attending: Internal Medicine | Admitting: Internal Medicine

## 2023-11-09 DIAGNOSIS — Z940 Kidney transplant status: Secondary | ICD-10-CM | POA: Diagnosis present

## 2023-11-09 LAB — CBC WITH DIFFERENTIAL/PLATELET
Abs Immature Granulocytes: 0.09 K/uL — ABNORMAL HIGH (ref 0.00–0.07)
Basophils Absolute: 0 K/uL (ref 0.0–0.1)
Basophils Relative: 1 %
Eosinophils Absolute: 0.1 K/uL (ref 0.0–0.5)
Eosinophils Relative: 4 %
HCT: 37.7 % (ref 36.0–46.0)
Hemoglobin: 11.5 g/dL — ABNORMAL LOW (ref 12.0–15.0)
Immature Granulocytes: 3 %
Lymphocytes Relative: 36 %
Lymphs Abs: 1.1 K/uL (ref 0.7–4.0)
MCH: 25.4 pg — ABNORMAL LOW (ref 26.0–34.0)
MCHC: 30.5 g/dL (ref 30.0–36.0)
MCV: 83.4 fL (ref 80.0–100.0)
Monocytes Absolute: 0.5 K/uL (ref 0.1–1.0)
Monocytes Relative: 15 %
Neutro Abs: 1.3 K/uL — ABNORMAL LOW (ref 1.7–7.7)
Neutrophils Relative %: 41 %
Platelets: 231 K/uL (ref 150–400)
RBC: 4.52 MIL/uL (ref 3.87–5.11)
RDW: 17.7 % — ABNORMAL HIGH (ref 11.5–15.5)
WBC: 3.2 K/uL — ABNORMAL LOW (ref 4.0–10.5)
nRBC: 0 % (ref 0.0–0.2)

## 2023-11-09 LAB — BASIC METABOLIC PANEL WITH GFR
Anion gap: 10 (ref 5–15)
BUN: 21 mg/dL (ref 8–23)
CO2: 21 mmol/L — ABNORMAL LOW (ref 22–32)
Calcium: 9.7 mg/dL (ref 8.9–10.3)
Chloride: 112 mmol/L — ABNORMAL HIGH (ref 98–111)
Creatinine, Ser: 0.95 mg/dL (ref 0.44–1.00)
GFR, Estimated: >60 mL/min (ref >60)
Glucose, Bld: 86 mg/dL (ref 70–99)
Potassium: 4.8 mmol/L (ref 3.5–5.1)
Sodium: 143 mmol/L (ref 135–145)

## 2023-11-09 LAB — URINALYSIS, COMPLETE (UACMP) WITH MICROSCOPIC
Bilirubin Urine: NEGATIVE
Glucose, UA: NEGATIVE mg/dL
Hgb urine dipstick: NEGATIVE
Ketones, ur: NEGATIVE mg/dL
Nitrite: NEGATIVE
Protein, ur: 100 mg/dL — AB
Specific Gravity, Urine: 1.014 (ref 1.005–1.030)
pH: 5 (ref 5.0–8.0)

## 2023-11-09 LAB — PROTEIN / CREATININE RATIO, URINE
Creatinine, Urine: 85 mg/dL
Protein Creatinine Ratio: 0.85 mg/mg{creat} — ABNORMAL HIGH (ref 0.00–0.15)
Total Protein, Urine: 72 mg/dL

## 2023-11-09 MED ORDER — SODIUM CHLORIDE 0.9 % IV SOLN
5.0000 mg/kg | INTRAVENOUS | Status: DC
  Administered 2023-11-09: 350 mg via INTRAVENOUS
  Filled 2023-11-09: qty 350

## 2023-11-11 LAB — SIROLIMUS LEVEL: Sirolimus (Rapamycin): 5.6 ng/mL (ref 3.0–20.0)

## 2023-11-14 ENCOUNTER — Telehealth (HOSPITAL_COMMUNITY): Payer: Self-pay | Admitting: Pharmacy Technician

## 2023-11-14 NOTE — Telephone Encounter (Signed)
 Auth Submission: NO AUTH NEEDED Site of care: Site of care: [facility] Payer: U[employer] Medicare, T[employer]are Medication & CPT/J Code(s) submitted: [employer]  (Belatacept ) [id number] Diagnosis Code: Z94.0 Route of submission (phone, fax, portal): portal Phone # Fax # Auth type: Buy/Bill HB Units/visits requested: 5mg /kg q 28 days Reference number: [id number] Approval from: 11/14/23 to 04/30/24    Denise Bush, CPhT [facility] [phone]

## 2023-12-06 ENCOUNTER — Ambulatory Visit (HOSPITAL_COMMUNITY)
Admission: RE | Admit: 2023-12-06 | Discharge: 2023-12-06 | Disposition: A | Discharge status: Home / Self Care | Source: Ambulatory Visit | Attending: Internal Medicine | Admitting: Internal Medicine

## 2023-12-06 VITALS — BP 135/46 | HR 57 | Temp 97.9°F | Wt 155.0 lb

## 2023-12-06 DIAGNOSIS — Z940 Kidney transplant status: Secondary | ICD-10-CM | POA: Insufficient documentation

## 2023-12-06 DIAGNOSIS — D849 Immunodeficiency, unspecified: Secondary | ICD-10-CM

## 2023-12-06 LAB — BASIC METABOLIC PANEL WITH GFR
Anion gap: 9 (ref 5–15)
BUN: 29 mg/dL — ABNORMAL HIGH (ref 8–23)
CO2: 18 mmol/L — ABNORMAL LOW (ref 22–32)
Calcium: 9.5 mg/dL (ref 8.9–10.3)
Chloride: 110 mmol/L (ref 98–111)
Creatinine, Ser: 1.18 mg/dL — ABNORMAL HIGH (ref 0.44–1.00)
GFR, Estimated: 49 mL/min — ABNORMAL LOW (ref >60)
Glucose, Bld: 209 mg/dL — ABNORMAL HIGH (ref 70–99)
Potassium: 4.2 mmol/L (ref 3.5–5.1)
Sodium: 137 mmol/L (ref 135–145)

## 2023-12-06 LAB — CBC WITH DIFFERENTIAL/PLATELET
Basophils Absolute: 0.1 K/uL (ref 0.0–0.1)
Basophils Relative: 3 %
Eosinophils Absolute: 0.1 K/uL (ref 0.0–0.5)
Eosinophils Relative: 4 %
HCT: 37 % (ref 36.0–46.0)
Hemoglobin: 11.3 g/dL — ABNORMAL LOW (ref 12.0–15.0)
Lymphocytes Relative: 33 %
Lymphs Abs: 1.2 K/uL (ref 0.7–4.0)
MCH: 25.6 pg — ABNORMAL LOW (ref 26.0–34.0)
MCHC: 30.5 g/dL (ref 30.0–36.0)
MCV: 83.7 fL (ref 80.0–100.0)
Monocytes Absolute: 0.6 K/uL (ref 0.1–1.0)
Monocytes Relative: 16 %
Neutro Abs: 1.6 K/uL — ABNORMAL LOW (ref 1.7–7.7)
Neutrophils Relative %: 44 %
Platelets: 208 K/uL (ref 150–400)
RBC: 4.42 MIL/uL (ref 3.87–5.11)
RDW: 17.8 % — ABNORMAL HIGH (ref 11.5–15.5)
WBC: 3.7 K/uL — ABNORMAL LOW (ref 4.0–10.5)
nRBC: 0 % (ref 0.0–0.2)

## 2023-12-06 LAB — URINALYSIS, COMPLETE (UACMP) WITH MICROSCOPIC
Bilirubin Urine: NEGATIVE
Glucose, UA: >=500 mg/dL — AB
Hgb urine dipstick: NEGATIVE
Ketones, ur: NEGATIVE mg/dL
Nitrite: NEGATIVE
Protein, ur: 100 mg/dL — AB
Specific Gravity, Urine: 1.023 (ref 1.005–1.030)
pH: 5 (ref 5.0–8.0)

## 2023-12-06 LAB — PROTEIN / CREATININE RATIO, URINE
Creatinine, Urine: 99 mg/dL
Protein Creatinine Ratio: 0.63 mg/mg{creat} — ABNORMAL HIGH (ref 0.00–0.15)
Total Protein, Urine: 62 mg/dL

## 2023-12-06 MED ORDER — SODIUM CHLORIDE 0.9 % IV SOLN
5.0000 mg/kg | INTRAVENOUS | Status: DC
  Administered 2023-12-06: 350 mg via INTRAVENOUS
  Filled 2023-12-06: qty 350

## 2023-12-07 ENCOUNTER — Encounter (HOSPITAL_COMMUNITY)

## 2023-12-08 LAB — SIROLIMUS LEVEL: Sirolimus (Rapamycin): 7.2 ng/mL (ref 3.0–20.0)

## 2024-01-03 ENCOUNTER — Ambulatory Visit (HOSPITAL_COMMUNITY)
Admission: RE | Admit: 2024-01-03 | Discharge: 2024-01-03 | Disposition: A | Discharge status: Home / Self Care | Source: Ambulatory Visit | Attending: Internal Medicine | Admitting: Internal Medicine

## 2024-01-03 DIAGNOSIS — D849 Immunodeficiency, unspecified: Secondary | ICD-10-CM

## 2024-01-03 DIAGNOSIS — Z940 Kidney transplant status: Secondary | ICD-10-CM | POA: Insufficient documentation

## 2024-01-03 LAB — CBC WITH DIFFERENTIAL/PLATELET
Abs Immature Granulocytes: 0.16 K/uL — ABNORMAL HIGH (ref 0.00–0.07)
Basophils Absolute: 0 K/uL (ref 0.0–0.1)
Basophils Relative: 1 %
Eosinophils Absolute: 0.1 K/uL (ref 0.0–0.5)
Eosinophils Relative: 3 %
HCT: 40.2 % (ref 36.0–46.0)
Hemoglobin: 12 g/dL (ref 12.0–15.0)
Immature Granulocytes: 5 %
Lymphocytes Relative: 32 %
Lymphs Abs: 1 K/uL (ref 0.7–4.0)
MCH: 25.6 pg — ABNORMAL LOW (ref 26.0–34.0)
MCHC: 29.9 g/dL — ABNORMAL LOW (ref 30.0–36.0)
MCV: 85.9 fL (ref 80.0–100.0)
Monocytes Absolute: 0.6 K/uL (ref 0.1–1.0)
Monocytes Relative: 19 %
Neutro Abs: 1.3 K/uL — ABNORMAL LOW (ref 1.7–7.7)
Neutrophils Relative %: 40 %
Platelets: 229 K/uL (ref 150–400)
RBC: 4.68 MIL/uL (ref 3.87–5.11)
RDW: 18.8 % — ABNORMAL HIGH (ref 11.5–15.5)
WBC: 3.2 K/uL — ABNORMAL LOW (ref 4.0–10.5)
nRBC: 0 % (ref 0.0–0.2)

## 2024-01-03 LAB — BASIC METABOLIC PANEL WITH GFR
Anion gap: 9 (ref 5–15)
BUN: 30 mg/dL — ABNORMAL HIGH (ref 8–23)
CO2: 21 mmol/L — ABNORMAL LOW (ref 22–32)
Calcium: 9.7 mg/dL (ref 8.9–10.3)
Chloride: 112 mmol/L — ABNORMAL HIGH (ref 98–111)
Creatinine, Ser: 1.17 mg/dL — ABNORMAL HIGH (ref 0.44–1.00)
GFR, Estimated: 50 mL/min — ABNORMAL LOW (ref >60)
Glucose, Bld: 186 mg/dL — ABNORMAL HIGH (ref 70–99)
Potassium: 4.3 mmol/L (ref 3.5–5.1)
Sodium: 142 mmol/L (ref 135–145)

## 2024-01-03 LAB — URINALYSIS, COMPLETE (UACMP) WITH MICROSCOPIC
Bilirubin Urine: NEGATIVE
Glucose, UA: >=500 mg/dL — AB
Hgb urine dipstick: NEGATIVE
Ketones, ur: NEGATIVE mg/dL
Nitrite: NEGATIVE
Protein, ur: 100 mg/dL — AB
Specific Gravity, Urine: 1.024 (ref 1.005–1.030)
pH: 5 (ref 5.0–8.0)

## 2024-01-03 LAB — PROTEIN / CREATININE RATIO, URINE
Creatinine, Urine: 91 mg/dL
Protein Creatinine Ratio: 0.68 mg/mg{creat} — ABNORMAL HIGH (ref 0.00–0.15)
Total Protein, Urine: 62 mg/dL

## 2024-01-03 MED ORDER — SODIUM CHLORIDE 0.9 % IV SOLN
5.0000 mg/kg | INTRAVENOUS | Status: DC
  Administered 2024-01-03: 350 mg via INTRAVENOUS
  Filled 2024-01-03: qty 350

## 2024-01-05 LAB — SIROLIMUS LEVEL: Sirolimus (Rapamycin): 6.4 ng/mL (ref 3.0–20.0)

## 2024-01-31 ENCOUNTER — Encounter (HOSPITAL_COMMUNITY)
Admission: RE | Admit: 2024-01-31 | Discharge: 2024-01-31 | Disposition: A | Discharge status: Home / Self Care | Source: Ambulatory Visit | Attending: Internal Medicine | Admitting: Internal Medicine

## 2024-01-31 VITALS — BP 137/56 | HR 60 | Temp 98.0°F | Resp 16

## 2024-01-31 DIAGNOSIS — Z940 Kidney transplant status: Secondary | ICD-10-CM | POA: Diagnosis present

## 2024-01-31 LAB — CBC WITH DIFFERENTIAL/PLATELET
Abs Immature Granulocytes: 0.11 K/uL — ABNORMAL HIGH (ref 0.00–0.07)
Basophils Absolute: 0 K/uL (ref 0.0–0.1)
Basophils Relative: 1 %
Eosinophils Absolute: 0.1 K/uL (ref 0.0–0.5)
Eosinophils Relative: 3 %
HCT: 38.5 % (ref 36.0–46.0)
Hemoglobin: 11.6 g/dL — ABNORMAL LOW (ref 12.0–15.0)
Immature Granulocytes: 4 %
Lymphocytes Relative: 36 %
Lymphs Abs: 1.1 K/uL (ref 0.7–4.0)
MCH: 25.5 pg — ABNORMAL LOW (ref 26.0–34.0)
MCHC: 30.1 g/dL (ref 30.0–36.0)
MCV: 84.6 fL (ref 80.0–100.0)
Monocytes Absolute: 0.6 K/uL (ref 0.1–1.0)
Monocytes Relative: 21 %
Neutro Abs: 1 K/uL — ABNORMAL LOW (ref 1.7–7.7)
Neutrophils Relative %: 35 %
Platelets: 227 K/uL (ref 150–400)
RBC: 4.55 MIL/uL (ref 3.87–5.11)
RDW: 18 % — ABNORMAL HIGH (ref 11.5–15.5)
WBC: 3 K/uL — ABNORMAL LOW (ref 4.0–10.5)
nRBC: 0 % (ref 0.0–0.2)

## 2024-01-31 LAB — URINALYSIS, ROUTINE W REFLEX MICROSCOPIC
Bilirubin Urine: NEGATIVE
Glucose, UA: >=500 mg/dL — AB
Hgb urine dipstick: NEGATIVE
Ketones, ur: NEGATIVE mg/dL
Nitrite: NEGATIVE
Protein, ur: 100 mg/dL — AB
Specific Gravity, Urine: 1.02 (ref 1.005–1.030)
pH: 5 (ref 5.0–8.0)

## 2024-01-31 LAB — BASIC METABOLIC PANEL WITH GFR
Anion gap: 10 (ref 5–15)
BUN: 29 mg/dL — ABNORMAL HIGH (ref 8–23)
CO2: 20 mmol/L — ABNORMAL LOW (ref 22–32)
Calcium: 9.4 mg/dL (ref 8.9–10.3)
Chloride: 109 mmol/L (ref 98–111)
Creatinine, Ser: 1.3 mg/dL — ABNORMAL HIGH (ref 0.44–1.00)
GFR, Estimated: 44 mL/min — ABNORMAL LOW (ref >60)
Glucose, Bld: 123 mg/dL — ABNORMAL HIGH (ref 70–99)
Potassium: 4.7 mmol/L (ref 3.5–5.1)
Sodium: 139 mmol/L (ref 135–145)

## 2024-01-31 LAB — PROTEIN / CREATININE RATIO, URINE
Creatinine, Urine: 90 mg/dL
Protein Creatinine Ratio: 0.7 mg/mg{creat} — ABNORMAL HIGH (ref 0.00–0.15)
Total Protein, Urine: 63 mg/dL

## 2024-01-31 MED ORDER — SODIUM CHLORIDE 0.9 % IV SOLN
5.0000 mg/kg | Freq: Once | INTRAVENOUS | Status: AC
  Administered 2024-01-31: 375 mg via INTRAVENOUS
  Filled 2024-01-31: qty 375

## 2024-01-31 NOTE — Progress Notes (Signed)
 Pt attempted to get a urine sample but missed the cup.  Small amount in cup sent to lab to attempt labs ordered

## 2024-02-03 LAB — SIROLIMUS LEVEL: Sirolimus (Rapamycin): 7 ng/mL (ref 3.0–20.0)

## 2024-02-12 ENCOUNTER — Other Ambulatory Visit (HOSPITAL_COMMUNITY): Payer: Self-pay | Admitting: Nephrology

## 2024-02-13 ENCOUNTER — Encounter (HOSPITAL_COMMUNITY): Payer: Self-pay | Admitting: Nephrology

## 2024-02-28 ENCOUNTER — Encounter (HOSPITAL_COMMUNITY)
Admission: RE | Admit: 2024-02-28 | Discharge: 2024-02-28 | Disposition: A | Discharge status: Home / Self Care | Source: Ambulatory Visit | Attending: Internal Medicine | Admitting: Internal Medicine

## 2024-02-28 VITALS — BP 145/49 | HR 57 | Temp 97.9°F | Resp 17

## 2024-02-28 DIAGNOSIS — Z940 Kidney transplant status: Secondary | ICD-10-CM

## 2024-02-28 LAB — CBC WITH DIFFERENTIAL/PLATELET
Abs Immature Granulocytes: 0.1 K/uL — ABNORMAL HIGH (ref 0.00–0.07)
Basophils Absolute: 0 K/uL (ref 0.0–0.1)
Basophils Relative: 1 %
Eosinophils Absolute: 0.1 K/uL (ref 0.0–0.5)
Eosinophils Relative: 4 %
HCT: 39.5 % (ref 36.0–46.0)
Hemoglobin: 12.2 g/dL (ref 12.0–15.0)
Immature Granulocytes: 3 %
Lymphocytes Relative: 32 %
Lymphs Abs: 1.1 K/uL (ref 0.7–4.0)
MCH: 25.8 pg — ABNORMAL LOW (ref 26.0–34.0)
MCHC: 30.9 g/dL (ref 30.0–36.0)
MCV: 83.7 fL (ref 80.0–100.0)
Monocytes Absolute: 0.6 K/uL (ref 0.1–1.0)
Monocytes Relative: 19 %
Neutro Abs: 1.4 K/uL — ABNORMAL LOW (ref 1.7–7.7)
Neutrophils Relative %: 41 %
Platelets: 234 K/uL (ref 150–400)
RBC: 4.72 MIL/uL (ref 3.87–5.11)
RDW: 18 % — ABNORMAL HIGH (ref 11.5–15.5)
WBC: 3.4 K/uL — ABNORMAL LOW (ref 4.0–10.5)
nRBC: 0 % (ref 0.0–0.2)

## 2024-02-28 LAB — URINALYSIS, COMPLETE (UACMP) WITH MICROSCOPIC
Bacteria, UA: NONE SEEN
Bilirubin Urine: NEGATIVE
Glucose, UA: >=500 mg/dL — AB
Hgb urine dipstick: NEGATIVE
Ketones, ur: NEGATIVE mg/dL
Nitrite: NEGATIVE
Protein, ur: 100 mg/dL — AB
Specific Gravity, Urine: 1.017 (ref 1.005–1.030)
pH: 5 (ref 5.0–8.0)

## 2024-02-28 LAB — PROTEIN / CREATININE RATIO, URINE
Creatinine, Urine: 83 mg/dL
Protein Creatinine Ratio: 0.7 mg/mg{creat} — ABNORMAL HIGH (ref 0.00–0.15)
Total Protein, Urine: 58 mg/dL

## 2024-02-28 MED ORDER — SODIUM CHLORIDE 0.9 % IV SOLN
5.0000 mg/kg | Freq: Once | INTRAVENOUS | Status: AC
  Administered 2024-02-28: 350 mg via INTRAVENOUS
  Filled 2024-02-28: qty 350

## 2024-02-29 ENCOUNTER — Other Ambulatory Visit (HOSPITAL_COMMUNITY): Payer: Self-pay | Admitting: Nephrology

## 2024-03-02 LAB — SIROLIMUS LEVEL: Sirolimus (Rapamycin): 5.9 ng/mL (ref 3.0–20.0)

## 2024-03-26 ENCOUNTER — Ambulatory Visit (HOSPITAL_COMMUNITY)
Admission: RE | Admit: 2024-03-26 | Discharge: 2024-03-26 | Disposition: A | Discharge status: Home / Self Care | Source: Ambulatory Visit | Attending: Nephrology | Admitting: Nephrology

## 2024-03-26 VITALS — BP 148/66 | HR 66 | Temp 98.1°F | Resp 17

## 2024-03-26 DIAGNOSIS — Z940 Kidney transplant status: Secondary | ICD-10-CM | POA: Insufficient documentation

## 2024-03-26 MED ORDER — SODIUM CHLORIDE 0.9 % IV SOLN
5.0000 mg/kg | Freq: Once | INTRAVENOUS | Status: AC
  Administered 2024-03-26: 350 mg via INTRAVENOUS
  Filled 2024-03-26: qty 350

## 2024-04-25 ENCOUNTER — Ambulatory Visit (HOSPITAL_COMMUNITY)
Admission: RE | Admit: 2024-04-25 | Discharge: 2024-04-25 | Disposition: A | Discharge status: Home / Self Care | Source: Ambulatory Visit | Attending: Nephrology | Admitting: Nephrology

## 2024-04-25 VITALS — BP 144/53 | HR 58 | Temp 97.7°F | Resp 16 | Wt 153.0 lb

## 2024-04-25 DIAGNOSIS — Z940 Kidney transplant status: Secondary | ICD-10-CM | POA: Diagnosis present

## 2024-04-25 MED ORDER — SODIUM CHLORIDE 0.9 % IV SOLN
5.0000 mg/kg | Freq: Once | INTRAVENOUS | Status: AC
  Administered 2024-04-25: 350 mg via INTRAVENOUS
  Filled 2024-04-25: qty 350

## 2024-05-15 ENCOUNTER — Telehealth (HOSPITAL_COMMUNITY): Payer: Self-pay

## 2024-05-15 NOTE — Telephone Encounter (Signed)
 Auth Submission: NO AUTH NEEDED Site of care: Site of care: [facility] Payer: [employer] Medicare, [employer] Medication & CPT/J Code(s) submitted: [employer]ul[employer]ix  (Belatacept ) [id number] Diagnosis Code: Z94.0 Route of submission (phone, fax, portal):  Phone # Fax # Auth type: Buy/Bill HB Units/visits requested: 350mg  q4weeks Reference number: [id number] Approval from: 05/15/24 to 04/30/25

## 2024-05-16 ENCOUNTER — Encounter (HOSPITAL_COMMUNITY): Payer: Self-pay | Admitting: Nephrology

## 2024-05-23 ENCOUNTER — Ambulatory Visit (HOSPITAL_COMMUNITY)
Admission: RE | Admit: 2024-05-23 | Discharge: 2024-05-23 | Disposition: A | Source: Ambulatory Visit | Attending: Nephrology | Admitting: Nephrology

## 2024-05-23 VITALS — BP 132/93 | HR 59 | Temp 98.3°F | Resp 17 | Wt 153.0 lb

## 2024-05-23 DIAGNOSIS — Z940 Kidney transplant status: Secondary | ICD-10-CM | POA: Diagnosis present

## 2024-05-23 LAB — BASIC METABOLIC PANEL WITH GFR
Anion gap: 12 (ref 5–15)
BUN: 29 mg/dL — ABNORMAL HIGH (ref 8–23)
CO2: 23 mmol/L (ref 22–32)
Calcium: 9.9 mg/dL (ref 8.9–10.3)
Chloride: 110 mmol/L (ref 98–111)
Creatinine, Ser: 1.09 mg/dL — ABNORMAL HIGH (ref 0.44–1.00)
GFR, Estimated: 54 mL/min — ABNORMAL LOW
Glucose, Bld: 158 mg/dL — ABNORMAL HIGH (ref 70–99)
Potassium: 5 mmol/L (ref 3.5–5.1)
Sodium: 145 mmol/L (ref 135–145)

## 2024-05-23 LAB — CBC WITH DIFFERENTIAL/PLATELET
Abs Immature Granulocytes: 0.07 K/uL (ref 0.00–0.07)
Basophils Absolute: 0 K/uL (ref 0.0–0.1)
Basophils Relative: 1 %
Eosinophils Absolute: 0.2 K/uL (ref 0.0–0.5)
Eosinophils Relative: 4 %
HCT: 39.4 % (ref 36.0–46.0)
Hemoglobin: 12.1 g/dL (ref 12.0–15.0)
Immature Granulocytes: 2 %
Lymphocytes Relative: 34 %
Lymphs Abs: 1.2 K/uL (ref 0.7–4.0)
MCH: 26.1 pg (ref 26.0–34.0)
MCHC: 30.7 g/dL (ref 30.0–36.0)
MCV: 85.1 fL (ref 80.0–100.0)
Monocytes Absolute: 0.6 K/uL (ref 0.1–1.0)
Monocytes Relative: 18 %
Neutro Abs: 1.4 K/uL — ABNORMAL LOW (ref 1.7–7.7)
Neutrophils Relative %: 41 %
Platelets: 211 K/uL (ref 150–400)
RBC: 4.63 MIL/uL (ref 3.87–5.11)
RDW: 19.1 % — ABNORMAL HIGH (ref 11.5–15.5)
WBC: 3.4 K/uL — ABNORMAL LOW (ref 4.0–10.5)
nRBC: 0 % (ref 0.0–0.2)

## 2024-05-23 LAB — URINALYSIS, COMPLETE (UACMP) WITH MICROSCOPIC
Bacteria, UA: NONE SEEN
Bilirubin Urine: NEGATIVE
Glucose, UA: >=500 mg/dL — AB
Hgb urine dipstick: NEGATIVE
Ketones, ur: NEGATIVE mg/dL
Nitrite: NEGATIVE
Protein, ur: 100 mg/dL — AB
Specific Gravity, Urine: 1.018 (ref 1.005–1.030)
pH: 5 (ref 5.0–8.0)

## 2024-05-23 LAB — PROTEIN / CREATININE RATIO, URINE
Creatinine, Urine: 59 mg/dL
Protein Creatinine Ratio: 0.7 mg/mg — ABNORMAL HIGH
Total Protein, Urine: 43 mg/dL

## 2024-05-23 MED ORDER — SODIUM CHLORIDE 0.9 % IV SOLN
5.0000 mg/kg | Freq: Once | INTRAVENOUS | Status: AC
  Administered 2024-05-23: 350 mg via INTRAVENOUS
  Filled 2024-05-23: qty 350

## 2024-05-26 LAB — SIROLIMUS LEVEL: Sirolimus (Rapamycin): 4.2 ng/mL (ref 3.0–20.0)

## 2024-06-20 ENCOUNTER — Encounter (HOSPITAL_COMMUNITY)
